# Patient Record
Sex: Male | Born: 1945
Health system: Southern US, Community
[De-identification: ages and names within clinical notes are randomized; demographics above are authoritative.]

## PROBLEM LIST (undated history)

## (undated) DIAGNOSIS — G4730 Sleep apnea, unspecified: Secondary | ICD-10-CM

## (undated) DIAGNOSIS — M5416 Radiculopathy, lumbar region: Secondary | ICD-10-CM

## (undated) DIAGNOSIS — H269 Unspecified cataract: Secondary | ICD-10-CM

## (undated) DIAGNOSIS — M79606 Pain in leg, unspecified: Secondary | ICD-10-CM

## (undated) DIAGNOSIS — M1990 Unspecified osteoarthritis, unspecified site: Secondary | ICD-10-CM

## (undated) DIAGNOSIS — M4120 Other idiopathic scoliosis, site unspecified: Secondary | ICD-10-CM

## (undated) DIAGNOSIS — N400 Enlarged prostate without lower urinary tract symptoms: Secondary | ICD-10-CM

## (undated) DIAGNOSIS — F329 Major depressive disorder, single episode, unspecified: Secondary | ICD-10-CM

## (undated) DIAGNOSIS — N189 Chronic kidney disease, unspecified: Secondary | ICD-10-CM

## (undated) DIAGNOSIS — M545 Low back pain, unspecified: Secondary | ICD-10-CM

## (undated) DIAGNOSIS — R05 Cough: Secondary | ICD-10-CM

## (undated) DIAGNOSIS — K429 Umbilical hernia without obstruction or gangrene: Secondary | ICD-10-CM

## (undated) DIAGNOSIS — R053 Chronic cough: Secondary | ICD-10-CM

## (undated) DIAGNOSIS — H3320 Serous retinal detachment, unspecified eye: Secondary | ICD-10-CM

## (undated) DIAGNOSIS — F32A Depression, unspecified: Secondary | ICD-10-CM

## (undated) DIAGNOSIS — I10 Essential (primary) hypertension: Secondary | ICD-10-CM

## (undated) DIAGNOSIS — M48061 Spinal stenosis, lumbar region without neurogenic claudication: Secondary | ICD-10-CM

## (undated) DIAGNOSIS — Z5189 Encounter for other specified aftercare: Secondary | ICD-10-CM

## (undated) DIAGNOSIS — K635 Polyp of colon: Secondary | ICD-10-CM

## (undated) DIAGNOSIS — C9000 Multiple myeloma not having achieved remission: Secondary | ICD-10-CM

## (undated) HISTORY — PX: COLONOSCOPY: SHX174

## (undated) HISTORY — DX: Unspecified osteoarthritis, unspecified site: M19.90

## (undated) HISTORY — DX: Sleep apnea, unspecified: G47.30

## (undated) HISTORY — DX: Benign prostatic hyperplasia without lower urinary tract symptoms: N40.0

## (undated) HISTORY — DX: Encounter for other specified aftercare: Z51.89

## (undated) HISTORY — PX: JOINT REPLACEMENT: SHX530

## (undated) HISTORY — DX: Chronic cough: R05.3

## (undated) HISTORY — DX: Pain in leg, unspecified: M79.606

## (undated) HISTORY — DX: Low back pain, unspecified: M54.50

## (undated) HISTORY — DX: Polyp of colon: K63.5

## (undated) HISTORY — DX: Other idiopathic scoliosis, site unspecified: M41.20

## (undated) HISTORY — PX: UMBILICAL HERNIA REPAIR: SHX196

## (undated) HISTORY — DX: Radiculopathy, lumbar region: M54.16

## (undated) HISTORY — PX: CATARACT EXTRACTION: SUR2

## (undated) HISTORY — DX: Unspecified cataract: H26.9

## (undated) HISTORY — DX: Essential (primary) hypertension: I10

## (undated) HISTORY — DX: Multiple myeloma not having achieved remission: C90.00

## (undated) HISTORY — PX: OTHER SURGICAL HISTORY: SHX169

## (undated) HISTORY — DX: Spinal stenosis, lumbar region without neurogenic claudication: M48.061

## (undated) HISTORY — DX: Umbilical hernia without obstruction or gangrene: K42.9

## (undated) MED FILL — Dexamethasone Sodium Phosphate Inj 100 MG/10ML: INTRAMUSCULAR | Qty: 1 | Status: AC

---
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
## 1967-07-23 DIAGNOSIS — Z5189 Encounter for other specified aftercare: Secondary | ICD-10-CM

## 1967-07-23 HISTORY — DX: Encounter for other specified aftercare: Z51.89

## 2003-05-06 ENCOUNTER — Encounter: Payer: Self-pay | Admitting: Internal Medicine

## 2003-05-06 ENCOUNTER — Encounter: Admission: RE | Admit: 2003-05-06 | Discharge: 2003-05-06 | Payer: Self-pay | Admitting: Internal Medicine

## 2003-07-23 DIAGNOSIS — K429 Umbilical hernia without obstruction or gangrene: Secondary | ICD-10-CM

## 2003-07-23 HISTORY — DX: Umbilical hernia without obstruction or gangrene: K42.9

## 2004-05-22 ENCOUNTER — Encounter: Admission: RE | Admit: 2004-05-22 | Discharge: 2004-05-22 | Payer: Self-pay | Admitting: General Surgery

## 2004-05-24 ENCOUNTER — Ambulatory Visit (HOSPITAL_COMMUNITY): Admission: RE | Admit: 2004-05-24 | Discharge: 2004-05-24 | Payer: Self-pay | Admitting: General Surgery

## 2004-05-24 ENCOUNTER — Ambulatory Visit (HOSPITAL_BASED_OUTPATIENT_CLINIC_OR_DEPARTMENT_OTHER): Admission: RE | Admit: 2004-05-24 | Discharge: 2004-05-24 | Payer: Self-pay | Admitting: General Surgery

## 2005-10-21 ENCOUNTER — Encounter: Admission: RE | Admit: 2005-10-21 | Discharge: 2005-10-21 | Payer: Self-pay | Admitting: Internal Medicine

## 2005-10-21 ENCOUNTER — Inpatient Hospital Stay (HOSPITAL_COMMUNITY): Admission: RE | Admit: 2005-10-21 | Discharge: 2005-10-23 | Payer: Self-pay | Admitting: Internal Medicine

## 2005-11-01 ENCOUNTER — Ambulatory Visit: Payer: Self-pay | Admitting: Internal Medicine

## 2005-11-12 ENCOUNTER — Ambulatory Visit: Payer: Self-pay | Admitting: Internal Medicine

## 2005-12-17 ENCOUNTER — Ambulatory Visit: Payer: Self-pay | Admitting: Internal Medicine

## 2006-01-03 ENCOUNTER — Ambulatory Visit: Payer: Self-pay | Admitting: Internal Medicine

## 2006-01-07 ENCOUNTER — Ambulatory Visit: Payer: Self-pay | Admitting: Cardiology

## 2008-06-10 ENCOUNTER — Ambulatory Visit: Payer: Self-pay | Admitting: Internal Medicine

## 2008-10-27 ENCOUNTER — Ambulatory Visit: Payer: Self-pay | Admitting: Internal Medicine

## 2009-05-08 ENCOUNTER — Ambulatory Visit: Payer: Self-pay | Admitting: Internal Medicine

## 2009-05-15 ENCOUNTER — Ambulatory Visit: Payer: Self-pay | Admitting: Internal Medicine

## 2009-05-26 ENCOUNTER — Ambulatory Visit: Payer: Self-pay | Admitting: Internal Medicine

## 2009-12-29 ENCOUNTER — Ambulatory Visit: Payer: Self-pay | Admitting: Internal Medicine

## 2010-01-15 ENCOUNTER — Ambulatory Visit: Payer: Self-pay | Admitting: Internal Medicine

## 2010-01-15 ENCOUNTER — Encounter: Admission: RE | Admit: 2010-01-15 | Discharge: 2010-01-15 | Payer: Self-pay | Admitting: Internal Medicine

## 2010-02-12 ENCOUNTER — Ambulatory Visit: Payer: Self-pay | Admitting: Internal Medicine

## 2010-05-04 ENCOUNTER — Ambulatory Visit: Payer: Self-pay | Admitting: Internal Medicine

## 2010-07-22 DIAGNOSIS — H3320 Serous retinal detachment, unspecified eye: Secondary | ICD-10-CM

## 2010-07-22 HISTORY — PX: INGUINAL HERNIA REPAIR: SUR1180

## 2010-07-22 HISTORY — DX: Serous retinal detachment, unspecified eye: H33.20

## 2010-10-01 ENCOUNTER — Ambulatory Visit (INDEPENDENT_AMBULATORY_CARE_PROVIDER_SITE_OTHER): Payer: BC Managed Care – PPO | Admitting: Internal Medicine

## 2010-10-01 DIAGNOSIS — K4041 Unilateral inguinal hernia, with gangrene, recurrent: Secondary | ICD-10-CM

## 2010-11-02 ENCOUNTER — Other Ambulatory Visit: Payer: Self-pay | Admitting: Internal Medicine

## 2010-11-02 ENCOUNTER — Ambulatory Visit (INDEPENDENT_AMBULATORY_CARE_PROVIDER_SITE_OTHER): Payer: BC Managed Care – PPO | Admitting: Internal Medicine

## 2010-11-02 ENCOUNTER — Ambulatory Visit
Admission: RE | Admit: 2010-11-02 | Discharge: 2010-11-02 | Disposition: A | Discharge status: Home / Self Care | Payer: BC Managed Care – PPO | Source: Ambulatory Visit | Attending: Internal Medicine | Admitting: Internal Medicine

## 2010-11-02 DIAGNOSIS — J9801 Acute bronchospasm: Secondary | ICD-10-CM

## 2010-11-02 DIAGNOSIS — R062 Wheezing: Secondary | ICD-10-CM

## 2010-11-02 DIAGNOSIS — J209 Acute bronchitis, unspecified: Secondary | ICD-10-CM

## 2010-11-08 ENCOUNTER — Ambulatory Visit
Admission: RE | Admit: 2010-11-08 | Discharge: 2010-11-08 | Disposition: A | Discharge status: Home / Self Care | Payer: BC Managed Care – PPO | Source: Ambulatory Visit | Attending: General Surgery | Admitting: General Surgery

## 2010-11-08 ENCOUNTER — Other Ambulatory Visit: Payer: Self-pay | Admitting: General Surgery

## 2010-11-08 DIAGNOSIS — Z01811 Encounter for preprocedural respiratory examination: Secondary | ICD-10-CM
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
## 2010-12-07 NOTE — Discharge Summary (Signed)
[name], [name]               ACCOUNT NO.:  [id number]   MEDICAL RECORD NO.:  [id number]          PATIENT TYPE:  INP   LOCATION:  1621                         FACILITY:  [facility]   PHYSICIAN:  Luanna Cole. Lenord Fellers, M.D.   DATE OF BIRTH:  1945-09-11   DATE OF ADMISSION:  10/21/2005  DATE OF DISCHARGE:  10/23/2005                                 DISCHARGE SUMMARY   FINAL DIAGNOSIS:  Acute lower abdominal pain:  Probable diverticulitis, free  fluid within the peritoneum, possibly due to perforated diverticulum versus  a sigmoid tumor perforation.   CONDITION ON DISCHARGE:  Stable and improved.   DISCHARGE MEDICATIONS:  1.  Aspirin 81 mg daily.  2.  Cipro 500 mg p.o. twice a day for 10 days.  3.  Flagyl 500 mg p.o. twice a day for 10 days.  4.  Vicodin 5/500 1 p.o. q.4-6h. p.r.n. pain.  5.  Aspirin 81 mg daily.   FOLLOW UP:  Follow up with Dr. Iva Boop November 12, 2005 at 2 p.m.   CONSULTATIONS:  1.  Dr. Lebron Conners.  2.  Dr. Iva Boop.   BRIEF HISTORY:  This pleasant 65 year old white male, who is in generally  good health, presented to the office on October 21, 2005 with history of onset  of suprapubic and left lower quadrant abdominal pain midday on October 20, 2005.  He had no vomiting and no diarrhea.  Bowel movements have been normal  over the past few days.  No nausea.  He was very uncomfortable with movement  but it was clear he had pain with movement.  He had a normal KUB, flat and  upright abdominal film, white blood cell count was 8000.  Urinalysis was  normal.  He was noted to be tender in the suprapubic and left lower quadrant  area.  He had no fever.  His prostate was boggy.  No shaking chills, no  melena, no bright red blood per rectum.  He had an outpatient walk-in CT of  the abdomen and pelvis.  Dr. Jean Rosenthal called to say there was free fluid in  his pelvis within the peritoneum and some inflammation about his sigmoid  colon.  The patient was thought to  possibly have acute diverticulitis or  perhaps a perforation with diverticulitis or even a sigmoid tumor with  perforation.  After discussing the case with me, we decided the patient  should be admitted.  He was admitted to [facility] and placed on  IV fluids consisting of d5-1/2 normal saline with 10 mEq of potassium  chloride per liter at 100 mL per hour.  Vital signs were monitored q.4h.  Strict intake and output was recorded.  He was placed on IV Cipro 400 mg  q.12h. and IV Flagyl 500 mg q.8h.  He was made n.p.o.  He was given morphine  IV 2 to 4 mg q.4h. as needed for pain.   The following day, the patient had not improved very much.  He was seen  initially upon admission by Dr. Lebron Conners, general surgeon.  It was  Dr.  Cammie Sickle opinion that the patient's presentation, if one of diverticulitis  was a bit unusual and warranted careful observation.  He agreed with the  above treatment.  He also suggested GI consultation.  The patient did have a  previous colonoscopy with Dr. Lina Sar.  [facility] was consulted and Dr.  Stan Head saw the patient.  Dr. Leone Payor felt that the patient should not  undergo immediate colonoscopy or sigmoidoscopy due to the possible risk of  perforation.  It was decided that the patient would be maintained on  antibiotics and allowed to be treated medically with plans to scope the  patient in a couple of weeks.  The patient's white count remained normal.  He remained afebrile throughout his entire hospital course.   By October 23, 2005, the patient had improved and passed quite a bit of gas.  He was started on clear liquids and by lunch time he was advanced to a low  residue diet which he tolerated well.  He had no pain medication at all on  October 23, 2005.  By late evening, he was eager to go home and seemed to be  doing well.  He was discharged home on the above medications with plans to  follow up with Dr. Leone Payor on November 12, 2005 at 2 p.m.   The patient was  advised to call if he developed recurrent symptoms of pain or new symptoms  of fever, shaking chills, nausea, or vomiting.           ______________________________  Luanna Cole. Lenord Fellers, M.D.     MJB/MEDQ  D:  10/23/2005  T:  10/24/2005  Job:  [id number]   cc:   Lebron Conners, M.D.  [address]  [place]  [place] [address]   Iva Boop, M.D. [facility]  [address] [place], [place] [address]

## 2010-12-07 NOTE — Op Note (Signed)
NAMEEULON, ALLNUTT               ACCOUNT NO.:  [id number]   MEDICAL RECORD NO.:  [id number]          PATIENT TYPE:  AMB   LOCATION:  DSC                          FACILITY:  [facility]   PHYSICIAN:  Adolph Pollack, M.D.DATE OF BIRTH:  [birth year]   DATE OF PROCEDURE:  05/24/2004  DATE OF DISCHARGE:                                 OPERATIVE REPORT   PREOPERATIVE DIAGNOSIS:  Chronically incarcerated umbilical hernia.   POSTOPERATIVE DIAGNOSIS:  Chronically incarcerated umbilical hernia.   OPERATION PERFORMED:  Umbilical hernia repair with mesh.   SURGEON:  Adolph Pollack, M.D.   ANESTHESIA:  General.   INDICATIONS FOR PROCEDURE:  The patient is a 65 year old male who has  noticed an increasing periumbilical bulge a little bit uncomfortable at  times.  He has a chronically incarcerated umbilical hernia and presents for  repair.   DESCRIPTION OF PROCEDURE:  He was seen in the holding area and placed supine  on the operating table and given a general anesthetic.  The periumbilical  hair was clipped and the area was prepped and draped.  I partially was able  to reduce the hernia.  Dilute Marcaine solution was infiltrated  superficially and deep in the periumbilical area.  A curvilinear  subumbilical incision was made through the skin and subcutaneous tissue down  to the level of the fascia.  I was able to dissect around the umbilicus and  isolate it.  I incised the sac and noted incarcerated omentum which I was  able reduce.  I then incised the sac more and released the umbilicus.  This  exposed the hernia defect.  I dissected the subcutaneous tissue away from  the fascia in circumferential fashion allowing for a 3 to 4 cm overlap.  I  then primarily closed the umbilical hernia defect with interrupted 0  Surgilon sutures.  A piece of polypropylene mesh was brought into the field.  The suture strings were threaded through the midportion of the mesh and this  was tightened down  anchoring the mesh directly over the primary repair.  The  periphery of the mesh was then anchored to the fascia with a running 0  Prolene suture.  This provided for more than adequate coverage of the  primary defect with overlap.  I injected the local anesthetic into the  fascia next.  I then reimplanted the umbilicus to the fascia with 3-0 Vicryl  suture.  The subcutaneous tissue was then closed over the fascia with  running 3-0 Vicryl suture and skin  closed with 4-0 Monocryl subcuticular stitch.  Steri-Strips and sterile  dressing were applied.  The patient tolerated the procedure without any  apparent complications.  Sponge, needle and instrument counts were correct  before closure.  He subsequently was taken to the recovery room in  satisfactory condition.       TJR/MEDQ  D:  05/24/2004  T:  05/24/2004  Job:  [id number]   cc:   Luanna Cole. Lenord Fellers, M.D.  [address], [place]  [place]  [place] [address]  Fax: [phone]
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
## 2010-12-18 ENCOUNTER — Encounter: Payer: Self-pay | Admitting: Internal Medicine

## 2011-03-28 ENCOUNTER — Encounter: Payer: Self-pay | Admitting: Internal Medicine

## 2011-03-29 ENCOUNTER — Other Ambulatory Visit: Payer: Self-pay | Admitting: Internal Medicine

## 2011-03-29 ENCOUNTER — Other Ambulatory Visit: Payer: BC Managed Care – PPO | Admitting: Internal Medicine

## 2011-03-29 DIAGNOSIS — Z0000 Encounter for general adult medical examination without abnormal findings: Secondary | ICD-10-CM

## 2011-03-29 LAB — COMPREHENSIVE METABOLIC PANEL
ALT: 16 U/L (ref 0–53)
AST: 28 U/L (ref 0–37)
Albumin: 4.1 g/dL (ref 3.5–5.2)
Alkaline Phosphatase: 68 U/L (ref 39–117)
BUN: 15 mg/dL (ref 6–23)
CO2: 24 mEq/L (ref 19–32)
Calcium: 9.1 mg/dL (ref 8.4–10.5)
Chloride: 105 mEq/L (ref 96–112)
Creat: 1.33 mg/dL (ref 0.50–1.35)
Glucose, Bld: 95 mg/dL (ref 70–99)
Potassium: 4.7 mEq/L (ref 3.5–5.3)
Sodium: 138 mEq/L (ref 135–145)
Total Bilirubin: 0.5 mg/dL (ref 0.3–1.2)
Total Protein: 7.1 g/dL (ref 6.0–8.3)

## 2011-03-29 LAB — LIPID PANEL
Cholesterol: 199 mg/dL (ref 0–200)
HDL: 41 mg/dL (ref >39)
LDL Cholesterol: 132 mg/dL — ABNORMAL HIGH (ref 0–99)
Total CHOL/HDL Ratio: 4.9 Ratio
Triglycerides: 131 mg/dL (ref <150)
VLDL: 26 mg/dL (ref 0–40)

## 2011-03-29 LAB — CBC
HCT: 41.3 % (ref 39.0–52.0)
Hemoglobin: 14 g/dL (ref 13.0–17.0)
MCH: 32.7 pg (ref 26.0–34.0)
MCHC: 33.9 g/dL (ref 30.0–36.0)
MCV: 96.5 fL (ref 78.0–100.0)
Platelets: 221 10*3/uL (ref 150–400)
RBC: 4.28 MIL/uL (ref 4.22–5.81)
RDW: 13.5 % (ref 11.5–15.5)
WBC: 6.4 10*3/uL (ref 4.0–10.5)

## 2011-03-29 LAB — PSA: PSA: 0.8 ng/mL (ref <=4.00)

## 2011-04-01 ENCOUNTER — Encounter: Payer: Self-pay | Admitting: Internal Medicine

## 2011-04-01 ENCOUNTER — Ambulatory Visit (INDEPENDENT_AMBULATORY_CARE_PROVIDER_SITE_OTHER): Payer: BC Managed Care – PPO | Admitting: Internal Medicine

## 2011-04-01 VITALS — BP 116/78 | HR 76 | Temp 98.6°F | Ht 70.0 in | Wt 230.0 lb

## 2011-04-01 DIAGNOSIS — F329 Major depressive disorder, single episode, unspecified: Secondary | ICD-10-CM

## 2011-04-01 DIAGNOSIS — K4090 Unilateral inguinal hernia, without obstruction or gangrene, not specified as recurrent: Secondary | ICD-10-CM

## 2011-04-01 DIAGNOSIS — M109 Gout, unspecified: Secondary | ICD-10-CM

## 2011-04-01 DIAGNOSIS — D126 Benign neoplasm of colon, unspecified: Secondary | ICD-10-CM

## 2011-04-01 DIAGNOSIS — Z8719 Personal history of other diseases of the digestive system: Secondary | ICD-10-CM

## 2011-04-01 DIAGNOSIS — F32A Depression, unspecified: Secondary | ICD-10-CM

## 2011-04-01 DIAGNOSIS — Z0000 Encounter for general adult medical examination without abnormal findings: Secondary | ICD-10-CM

## 2011-04-01 DIAGNOSIS — F3289 Other specified depressive episodes: Secondary | ICD-10-CM

## 2011-04-01 LAB — POCT URINALYSIS DIPSTICK
Bilirubin, UA: NEGATIVE
Blood, UA: NEGATIVE
Glucose, UA: NEGATIVE
Ketones, UA: NEGATIVE
Leukocytes, UA: NEGATIVE
Nitrite, UA: NEGATIVE
Protein, UA: NEGATIVE
Spec Grav, UA: 1.01
Urobilinogen, UA: NEGATIVE
pH, UA: 5

## 2011-04-01 LAB — HEMOGLOBIN A1C
Hgb A1c MFr Bld: 6 % — ABNORMAL HIGH (ref <5.7)
Mean Plasma Glucose: 126 mg/dL — ABNORMAL HIGH (ref <117)

## 2011-04-02 ENCOUNTER — Encounter: Payer: Self-pay | Admitting: Internal Medicine

## 2011-04-18 DIAGNOSIS — Z8719 Personal history of other diseases of the digestive system: Secondary | ICD-10-CM | POA: Insufficient documentation

## 2011-04-18 DIAGNOSIS — M109 Gout, unspecified: Secondary | ICD-10-CM | POA: Insufficient documentation

## 2011-04-18 DIAGNOSIS — Z860100 Personal history of colon polyps, unspecified: Secondary | ICD-10-CM | POA: Insufficient documentation

## 2011-04-18 DIAGNOSIS — F329 Major depressive disorder, single episode, unspecified: Secondary | ICD-10-CM | POA: Insufficient documentation

## 2011-04-18 DIAGNOSIS — K4090 Unilateral inguinal hernia, without obstruction or gangrene, not specified as recurrent: Secondary | ICD-10-CM | POA: Insufficient documentation

## 2011-04-18 DIAGNOSIS — F32A Depression, unspecified: Secondary | ICD-10-CM | POA: Insufficient documentation
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
## 2011-06-06 ENCOUNTER — Ambulatory Visit (INDEPENDENT_AMBULATORY_CARE_PROVIDER_SITE_OTHER): Payer: BC Managed Care – PPO | Admitting: General Surgery

## 2011-06-06 ENCOUNTER — Encounter (INDEPENDENT_AMBULATORY_CARE_PROVIDER_SITE_OTHER): Payer: Self-pay | Admitting: General Surgery

## 2011-06-06 VITALS — BP 120/80 | HR 45 | Temp 98.4°F | Resp 14

## 2011-06-06 DIAGNOSIS — K4090 Unilateral inguinal hernia, without obstruction or gangrene, not specified as recurrent: Secondary | ICD-10-CM

## 2011-06-06 NOTE — Patient Instructions (Signed)
Please stop your aspirin 5 days before surgery.

## 2011-06-06 NOTE — Progress Notes (Signed)
Patient ID: [name], male   DOB: 03/24/1946, 65 y.o.   MRN: [id number]  Chief Complaint  Patient presents with  . Other    Left inguinal hernia    HPI [name] is a 65 y.o. male.   HPI   He has a known small left ingunal hernia that had been asx until recently when it has started causing him some discomfort.  No obstructive sxs.  He does not strain to urinate or have constipation.  He is interested in hernia repair.  Past Medical History  Diagnosis Date  . Gout   . Umbilical hernia 2005    hernia repair    Past Surgical History  Procedure Date  . Umbilical hernia repair   . Inguinal hernia repair 2012    right  . Joint replacement     fused finger joint right ring finger  . Tonsilectomy, adenoidectomy, bilateral myringotomy and tubes 1953    Family History  Problem Relation Age of Onset  . Cancer Mother     lung   . Cancer Father     cancer    Social History History  Substance Use Topics  . Smoking status: Never Smoker   . Smokeless tobacco: Not on file  . Alcohol Use: Yes    No Known Allergies  Current Outpatient Prescriptions  Medication Sig Dispense Refill  . aspirin 81 MG tablet Take 81 mg by mouth daily.        . Multiple Vitamins-Minerals (MULTIVITAMIN,TX-MINERALS) tablet Take 1 tablet by mouth daily.          Review of Systems Review of Systems  Constitutional: Negative.   Respiratory: Positive for cough.   Cardiovascular: Negative.   Gastrointestinal: Negative.   Genitourinary: Negative for difficulty urinating.  Hematological: Bruises/bleeds easily (on aspirin).    Blood pressure 120/80, pulse 45, temperature 98.4 F (36.9 C), temperature source Temporal, resp. rate 14.  Physical Exam Physical Exam  Constitutional: No distress.       Overweight.  Cardiovascular: Normal rate and regular rhythm.   No murmur heard. Pulmonary/Chest: Effort normal and breath sounds normal.  Abdominal: Soft. He exhibits no mass.   Subumbilical scar.  Genitourinary:       Right inguinal scar with solid floor.  Moderate size left inguinal bulge that is reducible in the supine position  Musculoskeletal:       Varicosities in LEs.    Data Reviewed Previous op note  Assessment    Enlarging and now symptomatic left inguinal hernia.    Plan    Open left inguinal hernia repair with mesh.  I have explained the procedure, risks, and aftercare of inguinal hernia repair.  Risks include but are not limited to bleeding, infection, wound problems, anesthesia, recurrence, bladder or intestine injury, urinary retention, testicular dysfunction, chronic pain, mesh problems.  He seems to understand and agrees to proceed.       Frayda Egley J 06/06/2011, 4:43 PM

## 2011-07-05 DIAGNOSIS — K4090 Unilateral inguinal hernia, without obstruction or gangrene, not specified as recurrent: Secondary | ICD-10-CM

## 2011-07-08 ENCOUNTER — Other Ambulatory Visit (INDEPENDENT_AMBULATORY_CARE_PROVIDER_SITE_OTHER): Payer: Self-pay

## 2011-07-08 DIAGNOSIS — G8918 Other acute postprocedural pain: Secondary | ICD-10-CM

## 2011-07-08 MED ORDER — HYDROCODONE-ACETAMINOPHEN 5-325 MG PO TABS: 1.0000 | ORAL_TABLET | Freq: Four times a day (QID) | ORAL | Status: AC | PRN

## 2011-07-08 NOTE — Telephone Encounter (Signed)
Patient called and said he needed refill of pain medicine. He was taking Oxycodone. I told him I could call in a protocol of hydrocodone into his pharmacy. He said that was ok. Calling into [facility].

## 2011-08-05 ENCOUNTER — Telehealth (INDEPENDENT_AMBULATORY_CARE_PROVIDER_SITE_OTHER): Payer: Self-pay | Admitting: General Surgery

## 2011-08-05 NOTE — Telephone Encounter (Signed)
Pt states he is already 4wks out of post op, is it ok if he comes in on 08/23/11, this is the soonest available for Dr. Armanda Heritage, if not please call.

## 2011-08-05 NOTE — Telephone Encounter (Signed)
Spoke with [name] to tell him it was fine for him to wait until 08/23/11 for his post op appt.  I told him to call our office if he had any questions or concerns.

## 2011-08-23 ENCOUNTER — Ambulatory Visit (INDEPENDENT_AMBULATORY_CARE_PROVIDER_SITE_OTHER): Payer: BC Managed Care – PPO | Admitting: General Surgery

## 2011-08-23 ENCOUNTER — Encounter (INDEPENDENT_AMBULATORY_CARE_PROVIDER_SITE_OTHER): Payer: Self-pay | Admitting: General Surgery

## 2011-08-23 VITALS — BP 120/70 | HR 68 | Temp 98.1°F | Resp 16 | Ht 71.5 in | Wt 237.0 lb

## 2011-08-23 DIAGNOSIS — Z9889 Other specified postprocedural states: Secondary | ICD-10-CM

## 2011-08-23 NOTE — Progress Notes (Signed)
He presents for postop followup after open left inguinal hernia repair with mesh about 6 weeks ago.  Post op pain is improving.  No difficulty voiding or having BMs.  Swelling is decreasing.  P.E.  GU:  Left groin incision clean/dry/intact, swelling is minimal, repair is solid.  Assessment:  Doing well post hernia repair.  Plan:  Activities as tolerated.  Avoid activities that cause significant discomfort for the long term.  Return visit as needed.

## 2011-08-23 NOTE — Patient Instructions (Signed)
Activities as tolerated. 

## 2011-09-20 ENCOUNTER — Encounter (INDEPENDENT_AMBULATORY_CARE_PROVIDER_SITE_OTHER): Payer: Self-pay | Admitting: General Surgery

## 2012-04-13 ENCOUNTER — Encounter: Payer: Self-pay | Admitting: Internal Medicine

## 2012-05-26 ENCOUNTER — Encounter: Payer: Self-pay | Admitting: Internal Medicine

## 2012-06-26 ENCOUNTER — Ambulatory Visit (AMBULATORY_SURGERY_CENTER): Payer: BC Managed Care – PPO | Admitting: *Deleted

## 2012-06-26 VITALS — Ht 71.5 in | Wt 242.0 lb

## 2012-06-26 DIAGNOSIS — Z1211 Encounter for screening for malignant neoplasm of colon: Secondary | ICD-10-CM

## 2012-06-26 MED ORDER — SUPREP BOWEL PREP KIT 17.5-3.13-1.6 GM/177ML PO SOLN: ORAL | Status: DC

## 2012-07-20 ENCOUNTER — Ambulatory Visit (AMBULATORY_SURGERY_CENTER): Payer: BC Managed Care – PPO | Admitting: Internal Medicine

## 2012-07-20 ENCOUNTER — Encounter: Payer: Self-pay | Admitting: Internal Medicine

## 2012-07-20 VITALS — BP 103/69 | HR 56 | Temp 97.6°F | Resp 17 | Ht 71.0 in | Wt 242.0 lb

## 2012-07-20 DIAGNOSIS — Z86010 Personal history of colonic polyps: Secondary | ICD-10-CM

## 2012-07-20 DIAGNOSIS — K5730 Diverticulosis of large intestine without perforation or abscess without bleeding: Secondary | ICD-10-CM

## 2012-07-20 DIAGNOSIS — Z1211 Encounter for screening for malignant neoplasm of colon: Secondary | ICD-10-CM

## 2012-07-20 DIAGNOSIS — K648 Other hemorrhoids: Secondary | ICD-10-CM

## 2012-07-20 MED ORDER — SODIUM CHLORIDE 0.9 % IV SOLN: 500.0000 mL | INTRAVENOUS | Status: DC

## 2012-07-20 NOTE — Op Note (Signed)
[facility] [address] [place], [address]   COLONOSCOPY PROCEDURE REPORT  PATIENT: Wesley, [name]  MR#: [id number] BIRTHDATE: 12-Mar-1946 , 66  yrs. old GENDER: Male ENDOSCOPIST: Iva Boop, MD, [facility] PROCEDURE DATE:  07/20/2012 PROCEDURE:   Colonoscopy, diagnostic ASA CLASS:   Class II INDICATIONS:Screening and surveillance,personal history of colonic polyps. MEDICATIONS: propofol (Diprivan) 200mg  IV, MAC sedation, administered by CRNA, and These medications were titrated to patient response per physician's verbal order  DESCRIPTION OF PROCEDURE:   After the risks benefits and alternatives of the procedure were thoroughly explained, informed consent was obtained.  A digital rectal exam revealed no abnormalities of the rectum and A digital rectal exam revealed the prostate was not enlarged.   The Fuse-Demo-Scope  endoscope was introduced through the anus and advanced to the cecum, which was identified by both the appendix and ileocecal valve. No adverse events experienced.   The quality of the prep was Suprep excellent The instrument was then slowly withdrawn as the colon was fully examined.Photographs were taken and printed and will be uploaded to EMR.      COLON FINDINGS: Moderate diverticulosis was noted in the sigmoid colon.   Small internal hemorrhoids were found.   The colon mucosa was otherwise normal.   A right colon retroflexion was performed. Retroflexed views revealed internal hemorrhoids. The time to cecum=2 minutes 38 seconds.  Withdrawal time=9 minutes 49 seconds. The scope was withdrawn and the procedure completed. COMPLICATIONS: There were no complications.  ENDOSCOPIC IMPRESSION: 1.   Moderate diverticulosis was noted in the sigmoid colon 2.   Small internal hemorrhoids 3.   The colon mucosa was otherwise normal - excellent prep  RECOMMENDATIONS: Repeat Colonscopy in 10 years since no polyps today and in 2007 and only had 4  and 7 mm adenomas (2 total0 in 2003   eSigned:  Iva Boop, MD, [facility] 07/20/2012 10:38 AM   cc: Sharlet Salina, MD and The Patient

## 2012-07-20 NOTE — Patient Instructions (Addendum)
No polyps today! You do have diverticulosis and internal hemorrhoids. Please read the handouts provided. Next routine colonoscopy in 10 years 2023.  Thank you for choosing me and [facility].  Iva Boop, MD, FACG  YOU HAD AN ENDOSCOPIC PROCEDURE TODAY AT THE  ENDOSCOPY CENTER: Refer to the procedure report that was given to you for any specific questions about what was found during the examination.  If the procedure report does not answer your questions, please call your gastroenterologist to clarify.  If you requested that your care partner not be given the details of your procedure findings, then the procedure report has been included in a sealed envelope for you to review at your convenience later.  YOU SHOULD EXPECT: Some feelings of bloating in the abdomen. Passage of more gas than usual.  Walking can help get rid of the air that was put into your GI tract during the procedure and reduce the bloating. If you had a lower endoscopy (such as a colonoscopy or flexible sigmoidoscopy) you may notice spotting of blood in your stool or on the toilet paper. If you underwent a bowel prep for your procedure, then you may not have a normal bowel movement for a few days.  DIET: Your first meal following the procedure should be a light meal and then it is ok to progress to your normal diet.  A half-sandwich or bowl of soup is an example of a good first meal.  Heavy or fried foods are harder to digest and may make you feel nauseous or bloated.  Likewise meals heavy in dairy and vegetables can cause extra gas to form and this can also increase the bloating.  Drink plenty of fluids but you should avoid alcoholic beverages for 24 hours.  ACTIVITY: Your care partner should take you home directly after the procedure.  You should plan to take it easy, moving slowly for the rest of the day.  You can resume normal activity the day after the procedure however you should NOT DRIVE or use heavy  machinery for 24 hours (because of the sedation medicines used during the test).    SYMPTOMS TO REPORT IMMEDIATELY: A gastroenterologist can be reached at any hour.  During normal business hours, 8:30 AM to 5:00 PM Monday through Friday, call [phone].  After hours and on weekends, please call the GI answering service at [phone] who will take a message and have the physician on call contact you.   Following lower endoscopy (colonoscopy or flexible sigmoidoscopy):  Excessive amounts of blood in the stool  Significant tenderness or worsening of abdominal pains  Swelling of the abdomen that is new, acute  Fever of 100F or higher  FOLLOW UP: If any biopsies were taken you will be contacted by phone or by letter within the next 1-3 weeks.  Call your gastroenterologist if you have not heard about the biopsies in 3 weeks.  Our staff will call the home number listed on your records the next business day following your procedure to check on you and address any questions or concerns that you may have at that time regarding the information given to you following your procedure. This is a courtesy call and so if there is no answer at the home number and we have not heard from you through the emergency physician on call, we will assume that you have returned to your regular daily activities without incident.  SIGNATURES/CONFIDENTIALITY: You and/or your care partner have signed paperwork which will  be entered into your electronic medical record.  These signatures attest to the fact that that the information above on your After Visit Summary has been reviewed and is understood.  Full responsibility of the confidentiality of this discharge information lies with you and/or your care-partner.

## 2012-07-20 NOTE — Progress Notes (Signed)
Patient did not experience any of the following events: a burn prior to discharge; a fall within the facility; wrong site/side/patient/procedure/implant event; or a hospital transfer or hospital admission upon discharge from the facility. (G8907) Patient did not have preoperative order for IV antibiotic SSI prophylaxis. (G8918)  

## 2012-07-21 ENCOUNTER — Encounter: Payer: Self-pay | Admitting: Internal Medicine

## 2012-07-21 ENCOUNTER — Telehealth: Payer: Self-pay | Admitting: *Deleted

## 2012-07-21 NOTE — Telephone Encounter (Signed)
No answer, message left for the patient. 

## 2012-07-23 ENCOUNTER — Telehealth: Payer: Self-pay | Admitting: Internal Medicine

## 2012-07-23 NOTE — Telephone Encounter (Signed)
Patient had colonoscopy by Dr. Leone Payor showing diverticulosis and internal hemorrhoids on 07/20/2012. His wife called today and said he's been having daily chest pain for a number of days. We contacted the patient he admitted he been having some chest tightness. He is to come in tomorrow. He has an appointment for physical exam later this month. If symptoms acutely worsen, he go to the emergency department

## 2012-07-24 ENCOUNTER — Ambulatory Visit
Admission: RE | Admit: 2012-07-24 | Discharge: 2012-07-24 | Disposition: A | Discharge status: Home / Self Care | Payer: BC Managed Care – PPO | Source: Ambulatory Visit | Attending: Internal Medicine | Admitting: Internal Medicine

## 2012-07-24 ENCOUNTER — Ambulatory Visit (INDEPENDENT_AMBULATORY_CARE_PROVIDER_SITE_OTHER): Payer: BC Managed Care – PPO | Admitting: Internal Medicine

## 2012-07-24 ENCOUNTER — Encounter: Payer: Self-pay | Admitting: Internal Medicine

## 2012-07-24 VITALS — BP 120/86 | HR 60 | Temp 98.4°F | Wt 238.0 lb

## 2012-07-24 DIAGNOSIS — R0789 Other chest pain: Secondary | ICD-10-CM

## 2012-07-24 DIAGNOSIS — Z8659 Personal history of other mental and behavioral disorders: Secondary | ICD-10-CM

## 2012-07-24 DIAGNOSIS — R7302 Impaired glucose tolerance (oral): Secondary | ICD-10-CM

## 2012-07-24 DIAGNOSIS — I10 Essential (primary) hypertension: Secondary | ICD-10-CM

## 2012-07-24 DIAGNOSIS — R7309 Other abnormal glucose: Secondary | ICD-10-CM

## 2012-07-24 NOTE — Patient Instructions (Addendum)
Take nitroglycerin sublingually for recurrent chest pain. Let me know if this works. Start Lopressor 25 mg daily for hypertension. We will make appointment for you to see cardiology regarding chest tightness. Keep appointment for physical exam later this month.

## 2012-07-24 NOTE — Progress Notes (Addendum)
Subjective:    Patient ID: [name], male    DOB: 05-07-1946, 67 y.o.   MRN: [id number]  HPI 67 year old white male with history of depression and adenomatous colon polyps with recent colonoscopy by Dr. Leone Payor which was normal.  Wife called yesterday and said he been having some chest tightness for some time. We advised patient be seen today. Patient says for several months he's experienced chest 2 to 3 times a week. It would occur at rest when he was getting ready to read before bed. He took some TUMS for relief. Would feel better with TUMS. The past week or so he's had daily chest tightness. Some slight shortness of breath. No diaphoresis, nausea, or vomiting. No radiation of pain to neck or down left arm. No pain with exercise. He is basically sedentary. He commutes to [place] 5 days a week. He has to change that to 4 days a week in the near future. Denies being under stress. Likes his job but doesn't like the commute very much & says he is" tired of being tired."  Family history: Father died of lung cancer at age 78 but had an MI at age 32 and had history of smoking. Paternal grandmother with history of MI and diabetes. Mother died of lung cancer at age 58.  Social history: He is married. Wife has multiple sclerosis but works as a Designer, jewellery. He is a nonsmoker. Social alcohol consumption. This is his second marriage. First marriage ended in divorce. He has an adult daughter. 2 sisters- one of which has Crohn's disease and COPD. 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  Past medical history: Tonsillectomy 1953. Pneumonia 1969. Dementia went 1969. Torn tendon in finger 1982. Had fracture 1999.  No known drug allergies takes baby aspirin daily.  Patient had Cardiolite study by Dr. Myrtis Ser 06/20/2003. Wall motion was good. No evidence of ischemia. Ejection fraction was 54%. Patient walked on treadmill for 7 minutes with a peak heart rate of 157. Patient had cardiac  catheterization in 2001 with report of normal coronary arteries. He also had Cardiolite study done at  [facility]( Dr. Salome Spotted) 04/08/2000. He had an abnormal Cardiolite stress test at that time with possible evidence of a small inferior wall infarction. This was followed by cardiac catheterization which was reportedly was normal but I do not have a copy of that report.  Admitted to [facility] 10/21/2005 with lower abdominal pain and diagnosed with diverticulitis. This was treated with antibiotics he improved.  Seen by Dr. Vernie Ammons 2011 for decreased libido. Had normal testosterone level.  History of issues with recurrent MRSA infection 2009 and 2010 that subsequently resolved with aggressive treatment with I and Ds and antibiotics plus bathing in Hibiclens.  Note by Dr. Jens Som dated 05/25/2003 states that patient had findings on cardiac catheterization 2001 of a normal left main, normal LAD, and normal. diagonal ,circumflex and obtuse marginal and normal right coronary artery. Ejection fraction was 55-60% and there was no mitral regurgitation.   Had influenza vaccine September 2013. Needs to get one at pharmacy as we are out at this time. Zostavax vaccine given for 06/11/2007. Pneumococcal vaccine 10/31/2011.  History of diverticulosis in addition to tubular adenoma on colonoscopy 2004.  History of rosacea and seborrheic keratoses 2004.  History of right inguinal hernia repair 2012. History of small left inguinal hernia that is asymptomatic. History of umbilical hernia repair.  History of hemoglobin A1c 5.7% July 2011. History of vitamin D  deficiency.  Cholesterol in 2011 showed total cholesterol of 193, triglycerides of 168, LDL cholesterol of 115. Lipid panel Sept 2012 normal except LDL of 132.  In 2009 he wanted to donate a kidney and was evaluated at [facility]. He was found to have a creatinine of 1.30 with an estimated GFR 56 cc per minute. He had a normal EKG  04/07/2008 done there. Normal dobutamine stress echocardiogram 04/26/2008. He wanted to donate a kidney to a friend of his but subsequently I believe was turned down.      Review of Systems     Objective:   Physical Exam  Constitutional: He appears well-developed and well-nourished. No distress.  HENT:  Head: Normocephalic and atraumatic.  Right Ear: External ear normal.  Left Ear: External ear normal.  Mouth/Throat: No oropharyngeal exudate.  Eyes: Conjunctivae normal are normal. Right eye exhibits no discharge. Left eye exhibits no discharge. No scleral icterus.  Neck: No JVD present. No thyromegaly present.  Cardiovascular: Normal rate, regular rhythm and normal heart sounds.  Exam reveals no gallop.   No murmur heard. Pulmonary/Chest: Effort normal and breath sounds normal. He has no wheezes. He has no rales. He exhibits no tenderness.  Abdominal: Soft. Bowel sounds are normal. He exhibits no distension and no mass. There is no tenderness. There is no rebound and no guarding.  Musculoskeletal: He exhibits no edema.  Lymphadenopathy:    He has no cervical adenopathy.  Skin: Skin is warm and dry. He is not diaphoretic.  Psychiatric: He has a normal mood and affect. His behavior is normal. Judgment and thought content normal.          Assessment & Plan:  Chest tightness at rest- with prior history of negative Cardiac evaluations  History of depression-likely depressed now  History of gout  History of adenomatous colon polyps  Hypertension he brings in multiple blood pressure readings today which are significantly elevated however his blood pressure is normal today  History of elevated LDL of 132 in 2012  Obesity      Plan: Nitroglycerin 1/150 sublingual when necessary chest pain to see if he gets relief. Start Lopressor 25 mg XL daily for hypertension and cardiac protection. Continue aspirin 81 mg daily. Cardiology evaluation.  Keep appointment for physical  exam and fasting lab work 3 weeks from now which had been previously scheduled for some time. Fasting labs will be done then including lipid panel. If has sustained chest tightness without relief he is to go to emergency department

## 2012-07-27 NOTE — Progress Notes (Signed)
Patient informed of appointment as well with Dr. Antoine Poche on 08/28/2012 at 3:30 pm. States he has had a few more episodes of chest pain, for which he is taking Nitro SL. Advised to call their office to try to get in sooner

## 2012-07-31 ENCOUNTER — Ambulatory Visit (INDEPENDENT_AMBULATORY_CARE_PROVIDER_SITE_OTHER): Payer: BC Managed Care – PPO | Admitting: Cardiology

## 2012-07-31 ENCOUNTER — Encounter: Payer: Self-pay | Admitting: Cardiology

## 2012-07-31 VITALS — BP 118/76 | HR 71 | Ht 71.0 in | Wt 239.8 lb

## 2012-07-31 DIAGNOSIS — R072 Precordial pain: Secondary | ICD-10-CM

## 2012-07-31 DIAGNOSIS — F329 Major depressive disorder, single episode, unspecified: Secondary | ICD-10-CM

## 2012-07-31 DIAGNOSIS — F3289 Other specified depressive episodes: Secondary | ICD-10-CM

## 2012-07-31 DIAGNOSIS — F32A Depression, unspecified: Secondary | ICD-10-CM
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
## 2012-07-31 NOTE — Patient Instructions (Addendum)
Your physician has requested that you have a lexiscan myoview. For further information please visit [email or web address] Please follow instruction sheet, as given.  Your physician recommends that you return for lab work in: When you come back for your nuclear stress test (lipid-fasting)

## 2012-08-03 ENCOUNTER — Ambulatory Visit (HOSPITAL_COMMUNITY): Payer: BC Managed Care – PPO | Attending: Cardiology | Admitting: Radiology

## 2012-08-03 VITALS — BP 125/84 | Ht 71.0 in | Wt 233.0 lb

## 2012-08-03 DIAGNOSIS — R0789 Other chest pain: Secondary | ICD-10-CM | POA: Insufficient documentation

## 2012-08-03 DIAGNOSIS — R000 Tachycardia, unspecified: Secondary | ICD-10-CM | POA: Insufficient documentation

## 2012-08-03 DIAGNOSIS — R079 Chest pain, unspecified: Secondary | ICD-10-CM

## 2012-08-03 DIAGNOSIS — R072 Precordial pain: Secondary | ICD-10-CM

## 2012-08-03 DIAGNOSIS — I10 Essential (primary) hypertension: Secondary | ICD-10-CM | POA: Insufficient documentation

## 2012-08-03 MED ORDER — TECHNETIUM TC 99M SESTAMIBI GENERIC - CARDIOLITE
10.0000 | Freq: Once | INTRAVENOUS | Status: AC | PRN
  Administered 2012-08-03: 10 via INTRAVENOUS

## 2012-08-03 MED ORDER — TECHNETIUM TC 99M SESTAMIBI GENERIC - CARDIOLITE
30.0000 | Freq: Once | INTRAVENOUS | Status: AC | PRN
  Administered 2012-08-03: 30 via INTRAVENOUS

## 2012-08-03 NOTE — Progress Notes (Signed)
  [facility] SITE 3 NUCLEAR MED [address] [place], [place] [address] [phone]    Cardiology Nuclear Med Study  [name] is a 67 y.o. male     MRN : [id number]     DOB: 1945-12-09  Procedure Date: 08/03/2012  Nuclear Med Background Indication for Stress Test:  Evaluation for Ischemia History:  01' Heart Catheterization;04-05 Myocardial Perfusion Study [place] cardiology nml per pt  Cardiac Risk Factors: Family History - CAD, Hypertension and Lipids  Symptoms:  Chest Pain, Chest Pressure.  (last date of chest discomfort 08/03/11 relieved with x1 NTG) and Rapid HR   Nuclear Pre-Procedure Caffeine/Decaff Intake:  7:00pm NPO After: 6:30am   Lungs:  clear O2 Sat: 97% on room air. IV 0.9% NS with Angio Cath:  20g  IV Site: R Hand  IV Started by:  Cathlyn Parsons, RN  Chest Size (in):  44 Cup Size: n/a  Height: 5\' 11"  (1.803 m)  Weight:  233 lb (105.688 kg)  BMI:  Body mass index is 32.50 kg/(m^2). Tech Comments:  Lopressor taken at 0600    Nuclear Med Study 1 or 2 day study: 1 day  Stress Test Type:  Stress  Reading MD: Charlton Haws, MD  Order Authorizing Provider:  Melany Guernsey  Resting Radionuclide: Technetium 9m Sestamibi  Resting Radionuclide Dose: 11.0 mCi   Stress Radionuclide:  Technetium 74m Sestamibi  Stress Radionuclide Dose: 33.0 mCi           Stress Protocol Rest HR: 55 Stress HR: 141  Rest BP: 125/84 Stress BP: 174/96  Exercise Time (min): 9:02 METS: 10.10   Predicted Max HR: 155 bpm % Max HR: 90.97 bpm Rate Pressure Product: 21308    Dose of Adenosine (mg):  n/a Dose of Lexiscan: n/a mg  Dose of Atropine (mg): n/a Dose of Dobutamine: n/a mcg/kg/min (at max HR)  Stress Test Technologist: Frederick Peers, EMT-P  Nuclear Technologist:  Domenic Polite, CNMT     Rest Procedure:  Myocardial perfusion imaging was performed at rest 45 minutes following the intravenous administration of Technetium 3m Sestamibi. Rest ECG: NSR - Normal  EKG  Stress Procedure:  The patient exercised on the treadmill utilizing the Bruce Protocol for 9:02 minutes. The patient stopped due to SOB and denied any chest pain.  Technetium 39m Sestamibi was injected at peak exercise and myocardial perfusion imaging was performed after a brief delay. Stress ECG: No significant change from baseline ECG  QPS Raw Data Images:  Patient motion noted. Stress Images:  Normal homogeneous uptake in all areas of the myocardium. Rest Images:  Normal homogeneous uptake in all areas of the myocardium. Subtraction (SDS):  Normal Transient Ischemic Dilatation (Normal <1.22):  0.93 Lung/Heart Ratio (Normal <0.45):  0.37  Quantitative Gated Spect Images QGS EDV:  92 ml QGS ESV:  41 ml  Impression Exercise Capacity:  Fair exercise capacity. BP Response:  Normal blood pressure response. Clinical Symptoms:  There is dyspnea. ECG Impression:  No significant ST segment change suggestive of ischemia. Comparison with Prior Nuclear Study: No images to compare  Overall Impression:  Normal stress nuclear study.  LV Ejection Fraction: 55%.  LV Wall Motion:  NL LV Function; NL Wall Motion  Charlton Haws

## 2012-08-06 ENCOUNTER — Telehealth: Payer: Self-pay | Admitting: Cardiology

## 2012-08-06 NOTE — Telephone Encounter (Signed)
New Problem:    Patient called in wanting to know the results of his latest Stress Test.  Please call back.

## 2012-08-06 NOTE — Telephone Encounter (Signed)
Results reviewed with pt and sent to Dr Lenord Fellers as well.  Pt will follow up with his PCP

## 2012-08-10 ENCOUNTER — Other Ambulatory Visit: Payer: BC Managed Care – PPO | Admitting: Internal Medicine

## 2012-08-10 DIAGNOSIS — Z0000 Encounter for general adult medical examination without abnormal findings: Secondary | ICD-10-CM

## 2012-08-10 DIAGNOSIS — Z125 Encounter for screening for malignant neoplasm of prostate: Secondary | ICD-10-CM

## 2012-08-10 LAB — CBC WITH DIFFERENTIAL/PLATELET
Basophils Absolute: 0.1 10*3/uL (ref 0.0–0.1)
Basophils Relative: 1 % (ref 0–1)
Eosinophils Absolute: 0.2 10*3/uL (ref 0.0–0.7)
Eosinophils Relative: 3 % (ref 0–5)
HCT: 40.5 % (ref 39.0–52.0)
Hemoglobin: 14 g/dL (ref 13.0–17.0)
Lymphocytes Relative: 36 % (ref 12–46)
Lymphs Abs: 2.2 10*3/uL (ref 0.7–4.0)
MCH: 32.6 pg (ref 26.0–34.0)
MCHC: 34.6 g/dL (ref 30.0–36.0)
MCV: 94.4 fL (ref 78.0–100.0)
Monocytes Absolute: 0.6 10*3/uL (ref 0.1–1.0)
Monocytes Relative: 11 % (ref 3–12)
Neutro Abs: 3.1 10*3/uL (ref 1.7–7.7)
Neutrophils Relative %: 49 % (ref 43–77)
Platelets: 203 10*3/uL (ref 150–400)
RBC: 4.29 MIL/uL (ref 4.22–5.81)
RDW: 13.3 % (ref 11.5–15.5)
WBC: 6.1 10*3/uL (ref 4.0–10.5)

## 2012-08-10 LAB — COMPREHENSIVE METABOLIC PANEL
ALT: 16 U/L (ref 0–53)
AST: 23 U/L (ref 0–37)
Albumin: 4.2 g/dL (ref 3.5–5.2)
Alkaline Phosphatase: 61 U/L (ref 39–117)
BUN: 20 mg/dL (ref 6–23)
CO2: 27 mEq/L (ref 19–32)
Calcium: 9.6 mg/dL (ref 8.4–10.5)
Chloride: 103 mEq/L (ref 96–112)
Creat: 1.4 mg/dL — ABNORMAL HIGH (ref 0.50–1.35)
Glucose, Bld: 95 mg/dL (ref 70–99)
Potassium: 4.6 mEq/L (ref 3.5–5.3)
Sodium: 137 mEq/L (ref 135–145)
Total Bilirubin: 0.5 mg/dL (ref 0.3–1.2)
Total Protein: 7.2 g/dL (ref 6.0–8.3)

## 2012-08-10 LAB — LIPID PANEL
Cholesterol: 204 mg/dL — ABNORMAL HIGH (ref 0–200)
HDL: 43 mg/dL (ref >39)
LDL Cholesterol: 139 mg/dL — ABNORMAL HIGH (ref 0–99)
Total CHOL/HDL Ratio: 4.7 Ratio
Triglycerides: 111 mg/dL (ref <150)
VLDL: 22 mg/dL (ref 0–40)

## 2012-08-10 LAB — PSA: PSA: 1.04 ng/mL (ref <=4.00)

## 2012-08-14 ENCOUNTER — Encounter (INDEPENDENT_AMBULATORY_CARE_PROVIDER_SITE_OTHER): Payer: BC Managed Care – PPO | Admitting: Ophthalmology

## 2012-08-14 DIAGNOSIS — H43819 Vitreous degeneration, unspecified eye: Secondary | ICD-10-CM

## 2012-08-14 DIAGNOSIS — H2510 Age-related nuclear cataract, unspecified eye: Secondary | ICD-10-CM

## 2012-08-14 DIAGNOSIS — H33309 Unspecified retinal break, unspecified eye: Secondary | ICD-10-CM

## 2012-08-14 DIAGNOSIS — I10 Essential (primary) hypertension: Secondary | ICD-10-CM

## 2012-08-14 DIAGNOSIS — H35039 Hypertensive retinopathy, unspecified eye: Secondary | ICD-10-CM

## 2012-08-17 ENCOUNTER — Ambulatory Visit: Payer: BC Managed Care – PPO | Admitting: Cardiology

## 2012-08-21 ENCOUNTER — Ambulatory Visit (INDEPENDENT_AMBULATORY_CARE_PROVIDER_SITE_OTHER): Payer: BC Managed Care – PPO | Admitting: Internal Medicine

## 2012-08-21 ENCOUNTER — Encounter: Payer: Self-pay | Admitting: Internal Medicine

## 2012-08-21 VITALS — BP 108/78 | HR 68 | Temp 98.3°F | Ht 70.25 in | Wt 238.0 lb

## 2012-08-21 DIAGNOSIS — R6889 Other general symptoms and signs: Secondary | ICD-10-CM

## 2012-08-21 DIAGNOSIS — Z8659 Personal history of other mental and behavioral disorders: Secondary | ICD-10-CM

## 2012-08-21 DIAGNOSIS — Z8739 Personal history of other diseases of the musculoskeletal system and connective tissue: Secondary | ICD-10-CM

## 2012-08-21 DIAGNOSIS — Z86010 Personal history of colonic polyps: Secondary | ICD-10-CM

## 2012-08-21 DIAGNOSIS — Z0000 Encounter for general adult medical examination without abnormal findings: Secondary | ICD-10-CM

## 2012-08-21 DIAGNOSIS — R6882 Decreased libido: Secondary | ICD-10-CM

## 2012-08-21 DIAGNOSIS — I10 Essential (primary) hypertension: Secondary | ICD-10-CM

## 2012-08-21 DIAGNOSIS — Z862 Personal history of diseases of the blood and blood-forming organs and certain disorders involving the immune mechanism: Secondary | ICD-10-CM

## 2012-08-21 LAB — POCT URINALYSIS DIPSTICK
Bilirubin, UA: NEGATIVE
Blood, UA: NEGATIVE
Glucose, UA: NEGATIVE
Ketones, UA: NEGATIVE
Leukocytes, UA: NEGATIVE
Nitrite, UA: NEGATIVE
Protein, UA: NEGATIVE
Spec Grav, UA: 1.015
Urobilinogen, UA: NEGATIVE
pH, UA: 7

## 2012-08-21 LAB — CREATININE, SERUM: Creat: 1.39 mg/dL — ABNORMAL HIGH (ref 0.50–1.35)

## 2012-08-22 LAB — HEMOGLOBIN A1C
Hgb A1c MFr Bld: 5.9 % — ABNORMAL HIGH (ref <5.7)
Mean Plasma Glucose: 123 mg/dL — ABNORMAL HIGH (ref <117)

## 2012-08-28 ENCOUNTER — Ambulatory Visit: Payer: BC Managed Care – PPO | Admitting: Cardiology

## 2012-09-05 ENCOUNTER — Other Ambulatory Visit: Payer: Self-pay

## 2012-09-11 ENCOUNTER — Ambulatory Visit (INDEPENDENT_AMBULATORY_CARE_PROVIDER_SITE_OTHER): Payer: BC Managed Care – PPO | Admitting: Ophthalmology

## 2012-09-11 DIAGNOSIS — H33309 Unspecified retinal break, unspecified eye: Secondary | ICD-10-CM

## 2012-11-24 ENCOUNTER — Other Ambulatory Visit: Payer: Self-pay | Admitting: Internal Medicine

## 2013-01-14 ENCOUNTER — Ambulatory Visit (INDEPENDENT_AMBULATORY_CARE_PROVIDER_SITE_OTHER): Payer: Self-pay | Admitting: Ophthalmology

## 2013-01-15 ENCOUNTER — Ambulatory Visit (INDEPENDENT_AMBULATORY_CARE_PROVIDER_SITE_OTHER): Payer: BC Managed Care – PPO | Admitting: Ophthalmology

## 2013-01-15 DIAGNOSIS — H33309 Unspecified retinal break, unspecified eye: Secondary | ICD-10-CM

## 2013-01-15 DIAGNOSIS — H35039 Hypertensive retinopathy, unspecified eye: Secondary | ICD-10-CM

## 2013-01-15 DIAGNOSIS — I10 Essential (primary) hypertension: Secondary | ICD-10-CM

## 2013-01-15 DIAGNOSIS — H43819 Vitreous degeneration, unspecified eye: Secondary | ICD-10-CM

## 2013-01-15 DIAGNOSIS — H2510 Age-related nuclear cataract, unspecified eye: Secondary | ICD-10-CM

## 2013-01-18 ENCOUNTER — Telehealth: Payer: Self-pay | Admitting: Internal Medicine

## 2013-01-18 DIAGNOSIS — M549 Dorsalgia, unspecified: Secondary | ICD-10-CM

## 2013-01-18 NOTE — Telephone Encounter (Signed)
Patient will call us back if this doesn't help.  Pt verbalized understanding of instructions.
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
## 2013-01-23 NOTE — Patient Instructions (Addendum)
Continue same medications and return in one year. Keep an eye on your blood pressure.

## 2013-05-21 ENCOUNTER — Ambulatory Visit (INDEPENDENT_AMBULATORY_CARE_PROVIDER_SITE_OTHER): Payer: BC Managed Care – PPO | Admitting: Internal Medicine

## 2013-05-21 DIAGNOSIS — Z23 Encounter for immunization: Secondary | ICD-10-CM

## 2013-06-02 ENCOUNTER — Encounter (INDEPENDENT_AMBULATORY_CARE_PROVIDER_SITE_OTHER): Payer: BC Managed Care – PPO | Admitting: Ophthalmology

## 2013-06-02 DIAGNOSIS — H35039 Hypertensive retinopathy, unspecified eye: Secondary | ICD-10-CM

## 2013-06-02 DIAGNOSIS — H33309 Unspecified retinal break, unspecified eye: Secondary | ICD-10-CM

## 2013-06-02 DIAGNOSIS — H43819 Vitreous degeneration, unspecified eye: Secondary | ICD-10-CM

## 2013-06-02 DIAGNOSIS — I10 Essential (primary) hypertension: Secondary | ICD-10-CM

## 2013-06-02 DIAGNOSIS — G43919 Migraine, unspecified, intractable, without status migrainosus: Secondary | ICD-10-CM

## 2013-06-02 DIAGNOSIS — H2510 Age-related nuclear cataract, unspecified eye: Secondary | ICD-10-CM

## 2013-06-25 ENCOUNTER — Other Ambulatory Visit: Payer: Self-pay | Admitting: Internal Medicine

## 2013-07-02 ENCOUNTER — Ambulatory Visit (INDEPENDENT_AMBULATORY_CARE_PROVIDER_SITE_OTHER): Payer: BC Managed Care – PPO | Admitting: Neurology

## 2013-07-02 ENCOUNTER — Encounter: Payer: Self-pay | Admitting: Neurology

## 2013-07-02 VITALS — BP 133/81 | HR 57 | Ht 71.0 in | Wt 230.0 lb

## 2013-07-02 DIAGNOSIS — H539 Unspecified visual disturbance: Secondary | ICD-10-CM
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
## 2013-07-08 ENCOUNTER — Ambulatory Visit (INDEPENDENT_AMBULATORY_CARE_PROVIDER_SITE_OTHER): Payer: BC Managed Care – PPO

## 2013-07-08 DIAGNOSIS — H539 Unspecified visual disturbance: Secondary | ICD-10-CM

## 2013-07-09 ENCOUNTER — Telehealth: Payer: Self-pay | Admitting: Neurology

## 2013-07-09 NOTE — Telephone Encounter (Signed)
I have called him, MRI brain showed nonspecific tiny subcortical and periventricular white matter hyperintensities most consistent with small vessel disease,  He is to continue a daily aspirin, Ultrasound of carotid arteries pending  Annabelle Harman, place gave him a followup appointment in February 2015

## 2013-07-09 NOTE — Telephone Encounter (Signed)
Please mail a copy of MRI report to him.

## 2013-07-12 NOTE — Telephone Encounter (Signed)
Mailed results...Marland Kitchenas

## 2013-07-13 ENCOUNTER — Ambulatory Visit (INDEPENDENT_AMBULATORY_CARE_PROVIDER_SITE_OTHER): Payer: BC Managed Care – PPO

## 2013-07-13 DIAGNOSIS — H539 Unspecified visual disturbance: Secondary | ICD-10-CM

## 2013-07-13 DIAGNOSIS — H5310 Unspecified subjective visual disturbances: Secondary | ICD-10-CM

## 2013-09-03 ENCOUNTER — Encounter: Payer: Self-pay | Admitting: Internal Medicine

## 2013-09-03 ENCOUNTER — Ambulatory Visit (INDEPENDENT_AMBULATORY_CARE_PROVIDER_SITE_OTHER): Payer: BC Managed Care – PPO | Admitting: Internal Medicine

## 2013-09-03 VITALS — BP 124/90 | HR 60 | Temp 97.6°F | Ht 70.0 in | Wt 244.0 lb

## 2013-09-03 DIAGNOSIS — I679 Cerebrovascular disease, unspecified: Secondary | ICD-10-CM

## 2013-09-03 DIAGNOSIS — R6882 Decreased libido: Secondary | ICD-10-CM

## 2013-09-03 DIAGNOSIS — Z8719 Personal history of other diseases of the digestive system: Secondary | ICD-10-CM

## 2013-09-03 DIAGNOSIS — H539 Unspecified visual disturbance: Secondary | ICD-10-CM

## 2013-09-03 DIAGNOSIS — Z125 Encounter for screening for malignant neoplasm of prostate: Secondary | ICD-10-CM

## 2013-09-03 DIAGNOSIS — Z86010 Personal history of colonic polyps: Secondary | ICD-10-CM

## 2013-09-03 DIAGNOSIS — Z8659 Personal history of other mental and behavioral disorders: Secondary | ICD-10-CM

## 2013-09-03 DIAGNOSIS — I10 Essential (primary) hypertension: Secondary | ICD-10-CM

## 2013-09-03 DIAGNOSIS — Z13220 Encounter for screening for lipoid disorders: Secondary | ICD-10-CM

## 2013-09-03 DIAGNOSIS — Z0000 Encounter for general adult medical examination without abnormal findings: Secondary | ICD-10-CM

## 2013-09-03 DIAGNOSIS — Z130 Encounter for screening for diseases of the blood and blood-forming organs and certain disorders involving the immune mechanism: Secondary | ICD-10-CM

## 2013-09-03 LAB — CBC WITH DIFFERENTIAL/PLATELET
Basophils Absolute: 0 10*3/uL (ref 0.0–0.1)
Basophils Relative: 0 % (ref 0–1)
Eosinophils Absolute: 0.2 10*3/uL (ref 0.0–0.7)
Eosinophils Relative: 3 % (ref 0–5)
HCT: 39.6 % (ref 39.0–52.0)
Hemoglobin: 13.5 g/dL (ref 13.0–17.0)
Lymphocytes Relative: 38 % (ref 12–46)
Lymphs Abs: 2.5 10*3/uL (ref 0.7–4.0)
MCH: 32.8 pg (ref 26.0–34.0)
MCHC: 34.1 g/dL (ref 30.0–36.0)
MCV: 96.4 fL (ref 78.0–100.0)
Monocytes Absolute: 0.5 10*3/uL (ref 0.1–1.0)
Monocytes Relative: 8 % (ref 3–12)
Neutro Abs: 3.3 10*3/uL (ref 1.7–7.7)
Neutrophils Relative %: 51 % (ref 43–77)
Platelets: 214 10*3/uL (ref 150–400)
RBC: 4.11 MIL/uL — ABNORMAL LOW (ref 4.22–5.81)
RDW: 13.8 % (ref 11.5–15.5)
WBC: 6.4 10*3/uL (ref 4.0–10.5)

## 2013-09-03 LAB — POCT URINALYSIS DIPSTICK
Bilirubin, UA: NEGATIVE
Blood, UA: NEGATIVE
Glucose, UA: NEGATIVE
Ketones, UA: NEGATIVE
Leukocytes, UA: NEGATIVE
Nitrite, UA: NEGATIVE
Protein, UA: NEGATIVE
Spec Grav, UA: 1.02
Urobilinogen, UA: NEGATIVE
pH, UA: 6.5

## 2013-09-03 LAB — COMPREHENSIVE METABOLIC PANEL
ALT: 15 U/L (ref 0–53)
AST: 22 U/L (ref 0–37)
Albumin: 4.2 g/dL (ref 3.5–5.2)
Alkaline Phosphatase: 49 U/L (ref 39–117)
BUN: 15 mg/dL (ref 6–23)
CO2: 28 mEq/L (ref 19–32)
Calcium: 9.7 mg/dL (ref 8.4–10.5)
Chloride: 101 mEq/L (ref 96–112)
Creat: 1.37 mg/dL — ABNORMAL HIGH (ref 0.50–1.35)
Glucose, Bld: 95 mg/dL (ref 70–99)
Potassium: 4.6 mEq/L (ref 3.5–5.3)
Sodium: 135 mEq/L (ref 135–145)
Total Bilirubin: 0.7 mg/dL (ref 0.2–1.2)
Total Protein: 7.5 g/dL (ref 6.0–8.3)

## 2013-09-03 LAB — LIPID PANEL
Cholesterol: 188 mg/dL (ref 0–200)
HDL: 43 mg/dL (ref >39)
LDL Cholesterol: 117 mg/dL — ABNORMAL HIGH (ref 0–99)
Total CHOL/HDL Ratio: 4.4 Ratio
Triglycerides: 140 mg/dL (ref <150)
VLDL: 28 mg/dL (ref 0–40)

## 2013-09-04 LAB — PSA: PSA: 0.75 ng/mL (ref <=4.00)

## 2013-09-09 ENCOUNTER — Telehealth: Payer: Self-pay | Admitting: Neurology

## 2013-09-29 NOTE — Telephone Encounter (Signed)
Patient has an appt already.

## 2013-10-04 ENCOUNTER — Other Ambulatory Visit: Payer: Self-pay

## 2013-10-04 MED ORDER — METOPROLOL SUCCINATE ER 25 MG PO TB24: 25.0000 mg | ORAL_TABLET | Freq: Every day | ORAL | Status: DC

## 2013-10-08 ENCOUNTER — Encounter: Payer: Self-pay | Admitting: Neurology

## 2013-10-08 ENCOUNTER — Ambulatory Visit (INDEPENDENT_AMBULATORY_CARE_PROVIDER_SITE_OTHER): Payer: BC Managed Care – PPO | Admitting: Neurology

## 2013-10-08 VITALS — BP 124/81 | HR 62 | Ht 71.0 in | Wt 246.0 lb

## 2013-10-08 DIAGNOSIS — F329 Major depressive disorder, single episode, unspecified: Secondary | ICD-10-CM

## 2013-10-08 DIAGNOSIS — F3289 Other specified depressive episodes: Secondary | ICD-10-CM

## 2013-10-08 DIAGNOSIS — F32A Depression, unspecified: Secondary | ICD-10-CM

## 2013-10-08 MED ORDER — CLOPIDOGREL BISULFATE 75 MG PO TABS: 75.0000 mg | ORAL_TABLET | Freq: Every day | ORAL | Status: DC
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
## 2013-12-17 ENCOUNTER — Encounter: Payer: Self-pay | Admitting: Internal Medicine

## 2013-12-17 ENCOUNTER — Ambulatory Visit (INDEPENDENT_AMBULATORY_CARE_PROVIDER_SITE_OTHER): Payer: BC Managed Care – PPO | Admitting: Internal Medicine

## 2013-12-17 VITALS — BP 108/74 | HR 68 | Temp 99.0°F | Wt 244.0 lb

## 2013-12-17 DIAGNOSIS — R0609 Other forms of dyspnea: Secondary | ICD-10-CM

## 2013-12-17 DIAGNOSIS — G4730 Sleep apnea, unspecified: Secondary | ICD-10-CM

## 2013-12-17 DIAGNOSIS — R0683 Snoring: Secondary | ICD-10-CM

## 2013-12-17 DIAGNOSIS — R0989 Other specified symptoms and signs involving the circulatory and respiratory systems: Secondary | ICD-10-CM

## 2013-12-17 NOTE — Patient Instructions (Signed)
We'll will be made the pulmonologist to evaluate for possible sleep apnea. Physical exam due February 2016.

## 2013-12-17 NOTE — Progress Notes (Signed)
   Subjective:    Patient ID: [name]., male    DOB: 09/17/1945, 68 y.o.   MRN: [id number]  HPI In today to discuss issues with snoring and apnea. His wife is a Marine scientist and she says he stops breathing at night in addition to snoring. Apparently this is been going on for couple of years. He is overweight. Is recently started exercise on the treadmill. He has a history of depression, gout, colon polyps. Says he is tired a lot. However he drives to [place] to work from [place] 4 days a week.   In December he was evaluated by a neurologist because of an episode of visual field defect on the left in August 2014 lasting some 10 or 15 minutes. He was seen by Dr. Arlyn Dunning. He had an MRI of the brain showing tiny scattered periventricular  and subcortical nonspecific white matter hyperintensities. Most likely this is small vessel disease. It's unlikely he has to myelinating disease. He says neurologist told him there were a few more than usual present. He's concerned about this. His blood pressure is under excellent control. He is on metoprolol. He was placed on Plavix by neurologist.  He has a history of glucose intolerance and decreased libido. History of diverticulitis requiring hospitalization 2007.  He is maintained on a beta blocker as suggested by cardiologist when he was evaluated for chest discomfort in January 2014. Cardiologist noted a mild elevated blood pressure at the time. He had a negative stress test. He had a negative cardiac catheterization in 2001.    Review of Systems     Objective:   Physical Exam  Spent 15 minutes speaking with patient about these issues. He needs to be referred to pulmonologist for evaluation of possible sleep apnea      Assessment & Plan:  Probable sleep apnea  Obesity  Visual field disturbance on left seen by a neurologist December 2014 now on Plavix  History of depression  Plan: Appointment with pulmonologist for about a whitish a  possible sleep apnea.

## 2013-12-21 ENCOUNTER — Telehealth: Payer: Self-pay

## 2013-12-21 NOTE — Telephone Encounter (Signed)
Patient is scheduled with Dr. Sabino Donovan on 01/14/2014 at 3:30pm. He is aware of this.

## 2014-01-14 ENCOUNTER — Institutional Professional Consult (permissible substitution): Payer: BC Managed Care – PPO | Admitting: Pulmonary Disease

## 2014-01-28 ENCOUNTER — Ambulatory Visit (INDEPENDENT_AMBULATORY_CARE_PROVIDER_SITE_OTHER): Payer: BC Managed Care – PPO | Admitting: Ophthalmology

## 2014-02-18 ENCOUNTER — Institutional Professional Consult (permissible substitution): Payer: BC Managed Care – PPO | Admitting: Pulmonary Disease

## 2014-02-27 DIAGNOSIS — I10 Essential (primary) hypertension: Secondary | ICD-10-CM | POA: Insufficient documentation

## 2014-02-27 DIAGNOSIS — I679 Cerebrovascular disease, unspecified: Secondary | ICD-10-CM | POA: Insufficient documentation

## 2014-02-27 NOTE — Patient Instructions (Signed)
Continue Plavix . Continue metoprolol. Return in one year or as needed.
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
## 2014-03-21 ENCOUNTER — Institutional Professional Consult (permissible substitution): Payer: BC Managed Care – PPO | Admitting: Pulmonary Disease

## 2014-03-25 ENCOUNTER — Ambulatory Visit (INDEPENDENT_AMBULATORY_CARE_PROVIDER_SITE_OTHER): Payer: Medicare Other | Admitting: Internal Medicine

## 2014-03-25 ENCOUNTER — Ambulatory Visit
Admission: RE | Admit: 2014-03-25 | Discharge: 2014-03-25 | Disposition: A | Discharge status: Home / Self Care | Payer: Medicare Other | Source: Ambulatory Visit | Attending: Internal Medicine | Admitting: Internal Medicine

## 2014-03-25 ENCOUNTER — Encounter: Payer: Self-pay | Admitting: Internal Medicine

## 2014-03-25 VITALS — BP 110/72 | HR 80 | Temp 101.9°F | Wt 237.0 lb

## 2014-03-25 DIAGNOSIS — R05 Cough: Secondary | ICD-10-CM

## 2014-03-25 DIAGNOSIS — R509 Fever, unspecified: Secondary | ICD-10-CM | POA: Diagnosis not present

## 2014-03-25 DIAGNOSIS — R059 Cough, unspecified: Secondary | ICD-10-CM

## 2014-03-25 LAB — CBC WITH DIFFERENTIAL/PLATELET
Basophils Absolute: 0 10*3/uL (ref 0.0–0.1)
Eosinophils Absolute: 0 10*3/uL (ref 0.0–0.7)
HCT: 41.4 % (ref 39.0–52.0)
Hemoglobin: 13.2 g/dL (ref 13.0–17.0)
Lymphocytes Relative: 9 % — ABNORMAL LOW (ref 12–46)
Lymphs Abs: 0.9 10*3/uL (ref 0.7–4.0)
MCH: 32.8 pg (ref 26.0–34.0)
MCHC: 31.9 g/dL (ref 30.0–36.0)
MCV: 102.8 fL — ABNORMAL HIGH (ref 78.0–100.0)
Monocytes Absolute: 0.4 10*3/uL (ref 0.1–1.0)
Monocytes Relative: 4 % (ref 3–12)
Neutro Abs: 8.8 10*3/uL — ABNORMAL HIGH (ref 1.7–7.7)
Neutrophils Relative %: 87 % — ABNORMAL HIGH (ref 43–77)
Platelets: 160 10*3/uL (ref 150–400)
RBC: 4.03 MIL/uL — ABNORMAL LOW (ref 4.22–5.81)
RDW: 13 % (ref 11.5–15.5)
WBC: 10.1 10*3/uL (ref 4.0–10.5)

## 2014-03-25 MED ORDER — CLARITHROMYCIN 500 MG PO TABS: 500.0000 mg | ORAL_TABLET | Freq: Two times a day (BID) | ORAL | Status: DC

## 2014-03-25 NOTE — Patient Instructions (Addendum)
  Take Biaxin 500 mg twice daily for 10 days. Tylenol as needed for fever.

## 2014-03-25 NOTE — Progress Notes (Signed)
   Subjective:    Patient ID: [name]., male    DOB: [birth year], 68 y.o.   MRN: [id number]  HPI  Onset yesterday of chills. No complaint of sore throat or URI symptoms. Has had slight cough which is nonproductive. No sore throat. Says posterior cervical glands have been sore.    Review of Systems     Objective:   Physical Exam Face is flushed. TMs are clear. Pharynx is clear. Neck is supple. Has some tenderness and posterior cervical areas bilaterally but no enlarged lymph nodes. Chest decreased breath sounds left lower lobe. Chest x-ray is negative. CBC drawn.       Assessment & Plan:  Acute URI  Plan:Biaxin 500 mg bid x 10 days. CBC with diff is WNL.

## 2014-03-29 ENCOUNTER — Ambulatory Visit (INDEPENDENT_AMBULATORY_CARE_PROVIDER_SITE_OTHER): Payer: Medicare Other | Admitting: Internal Medicine

## 2014-03-29 ENCOUNTER — Encounter: Payer: Self-pay | Admitting: Internal Medicine

## 2014-03-29 VITALS — BP 110/72 | HR 80 | Temp 98.6°F | Wt 237.0 lb

## 2014-03-29 DIAGNOSIS — R509 Fever, unspecified: Secondary | ICD-10-CM

## 2014-03-29 LAB — CBC WITH DIFFERENTIAL/PLATELET
Basophils Absolute: 0.1 10*3/uL (ref 0.0–0.1)
Basophils Relative: 1 % (ref 0–1)
Eosinophils Absolute: 0.1 10*3/uL (ref 0.0–0.7)
Eosinophils Relative: 1 % (ref 0–5)
HCT: 36.9 % — ABNORMAL LOW (ref 39.0–52.0)
Hemoglobin: 12.7 g/dL — ABNORMAL LOW (ref 13.0–17.0)
Lymphocytes Relative: 39 % (ref 12–46)
Lymphs Abs: 2.4 10*3/uL (ref 0.7–4.0)
MCH: 33.8 pg (ref 26.0–34.0)
MCHC: 34.4 g/dL (ref 30.0–36.0)
MCV: 98.1 fL (ref 78.0–100.0)
Monocytes Absolute: 0.5 10*3/uL (ref 0.1–1.0)
Monocytes Relative: 9 % (ref 3–12)
Neutro Abs: 3.1 10*3/uL (ref 1.7–7.7)
Neutrophils Relative %: 50 % (ref 43–77)
Platelets: 207 10*3/uL (ref 150–400)
RBC: 3.76 MIL/uL — ABNORMAL LOW (ref 4.22–5.81)
RDW: 13 % (ref 11.5–15.5)
WBC: 6.1 10*3/uL (ref 4.0–10.5)

## 2014-03-29 NOTE — Progress Notes (Signed)
   Subjective:    Patient ID: Wesley Guadalajara., male    DOB: 1946-03-27, 68 y.o.   MRN: [id number]  HPI  He was here on Friday September 4  with fever and respiratory congestion. Apparently  had onset the day before. White blood cell count was within normal limits. Chest x-ray was negative for pneumonia. He had malaise fatigue fever and chills. Was complaining of some ear congestion. Could not obtain rapid strep screen because of strong gag reflex.  He called morning saying he was still sick with fever up to 101. He was placed on Biaxin on September 4. Now says that he actually has some improvement in his respiratory symptoms. He still has some left ear pain. Complaining of blisters on his lower lip.    Review of Systems     Objective:   Physical Exam  Multiple vesicular lesions on lower lip. Left TM is dull. Right TM is clear. Neck is supple. Chest clear. CBC was repeated today      Assessment & Plan:  Herpetic stomatitis-likely due to viral syndrome  Left otitis media  Plan: Complete course of Biaxin as prescribed. Add Zovirax 200 mg 5 times daily for 10 days.

## 2014-03-29 NOTE — Patient Instructions (Signed)
Takes Zovirax 200 mg 5 times daily. Finish Biaxin. Take Tylenol for fever.

## 2014-04-05 ENCOUNTER — Telehealth: Payer: Self-pay

## 2014-04-05 NOTE — Telephone Encounter (Signed)
Patient advised of lab results and appointment to have b12 checked made.

## 2014-04-05 NOTE — Telephone Encounter (Signed)
Message copied by Amado Coe on Tue Apr 05, 2014  4:12 PM ------      Message from: [name]      Created: Tue Mar 29, 2014  2:44 PM       Please call pt WBC is normal at 6,100 ------

## 2014-04-12 ENCOUNTER — Other Ambulatory Visit: Payer: Medicare Other | Admitting: Internal Medicine

## 2014-04-12 DIAGNOSIS — R5383 Other fatigue: Principal | ICD-10-CM

## 2014-04-12 DIAGNOSIS — R5381 Other malaise: Secondary | ICD-10-CM

## 2014-04-12 NOTE — Addendum Note (Signed)
Addended by: Amado Coe on: 04/12/2014 09:29 AM   Modules accepted: Orders

## 2014-04-13 ENCOUNTER — Telehealth: Payer: Self-pay

## 2014-04-13 LAB — VITAMIN B12: Vitamin B-12: 624 pg/mL (ref 211–911)

## 2014-04-13 NOTE — Telephone Encounter (Signed)
Patient aware of normal B12 lab results.

## 2014-04-13 NOTE — Telephone Encounter (Signed)
Message copied by Amado Coe on Wed Apr 13, 2014  9:00 AM ------      Message from: [name]      Created: Wed Apr 13, 2014  8:54 AM       Please call patient. B12 level is normal      ----- Message -----         From: Lab in [facility]         Sent: 04/13/2014   3:08 AM           To: [name], MD                   ------

## 2014-04-20 ENCOUNTER — Telehealth: Payer: Self-pay | Admitting: Neurology

## 2014-04-20 ENCOUNTER — Telehealth: Payer: Self-pay | Admitting: Internal Medicine

## 2014-04-20 MED ORDER — CLOPIDOGREL BISULFATE 75 MG PO TABS
75.0000 mg | ORAL_TABLET | Freq: Every day | ORAL | Status: DC
Start: 2014-04-20 — End: 2014-05-09

## 2014-04-20 NOTE — Telephone Encounter (Signed)
Patient requesting 30 day supply for clopidogrel (PLAVIX) 75 MG tablet.  Please send to [facility]  Until he's set up with mail order with new MCR.  Please call anytime and may leave detailed message on voicemail.

## 2014-04-20 NOTE — Telephone Encounter (Signed)
Needs 30 day supply  [facility] [address]

## 2014-04-20 NOTE — Telephone Encounter (Signed)
Rx has been sent.  I called the patient.  He is aware.

## 2014-04-22 ENCOUNTER — Other Ambulatory Visit: Payer: Self-pay

## 2014-04-22 MED ORDER — METOPROLOL SUCCINATE ER 25 MG PO TB24: 25.0000 mg | ORAL_TABLET | Freq: Every day | ORAL | Status: DC

## 2014-04-22 NOTE — Telephone Encounter (Signed)
[facility] patient aware.

## 2014-04-25 NOTE — Telephone Encounter (Signed)
Message was not routed to the pool to be handled.  Patient called back and Larene Beach handled his refill.

## 2014-05-06 ENCOUNTER — Other Ambulatory Visit: Payer: Self-pay

## 2014-05-06 MED ORDER — METOPROLOL SUCCINATE ER 25 MG PO TB24: 25.0000 mg | ORAL_TABLET | Freq: Every day | ORAL | Status: DC

## 2014-05-09 ENCOUNTER — Other Ambulatory Visit: Payer: Self-pay

## 2014-05-09 MED ORDER — CLOPIDOGREL BISULFATE 75 MG PO TABS
75.0000 mg | ORAL_TABLET | Freq: Every day | ORAL | Status: DC
Start: 2014-05-09 — End: 2014-11-14

## 2014-05-18 ENCOUNTER — Other Ambulatory Visit: Payer: Self-pay | Admitting: Internal Medicine

## 2014-05-31 ENCOUNTER — Encounter: Payer: Self-pay | Admitting: Pulmonary Disease

## 2014-05-31 ENCOUNTER — Ambulatory Visit (INDEPENDENT_AMBULATORY_CARE_PROVIDER_SITE_OTHER): Payer: Medicare Other | Admitting: Pulmonary Disease

## 2014-05-31 VITALS — BP 112/64 | HR 66 | Temp 98.8°F | Ht 71.0 in | Wt 238.4 lb

## 2014-05-31 DIAGNOSIS — G4733 Obstructive sleep apnea (adult) (pediatric): Secondary | ICD-10-CM | POA: Insufficient documentation

## 2014-05-31 NOTE — Progress Notes (Signed)
Subjective:    Patient ID: [name]., male    DOB: 1946/06/18, 68 y.o.   MRN: [id number]  HPI The patient is a 68 year old male who I've been asked to see for possible obstructive sleep apnea. He has been noted to have loud snoring as well as witnessed apneas by his wife during sleep. He admits to having frequent awakenings at night, and is not rested more than 50% of the mornings upon arising. He stays extremely busy during the day, but he can have sleepiness if he sits in his recliner to watch television. Overall, he feels that his alertness is adequate during the day. He denies any sleepiness with driving. The patient states that his weight is neutral over the last 2 years, and his Epworth score today is only 2.   Sleep Questionnaire What time do you typically go to bed?( Between what hours) 8-10PM 8-10PM at 1358 on 05/31/14 by Inge Rise, CMA How long does it take you to fall asleep? reads for 1 hr then goes to sleep reads for 1 hr then goes to sleep at 1358 on 05/31/14 by Inge Rise, CMA How many times during the night do you wake up? 3 3 at 1358 on 05/31/14 by Inge Rise, [place] What time do you get out of bed to start your day? 0700 0700 at 1358 on 05/31/14 by Inge Rise, CMA Do you drive or operate heavy machinery in your occupation? No No at 1358 on 05/31/14 by Inge Rise, CMA How much has your weight changed (up or down) over the past two years? (In pounds) 0 oz (0 kg) 0 oz (0 kg) at 1358 on 05/31/14 by Inge Rise, CMA Have you ever had a sleep study before? No No at 1358 on 05/31/14 by Inge Rise, CMA Do you currently use CPAP? No No at 1358 on 05/31/14 by Inge Rise, CMA Do you wear oxygen at any time? No No at 1358 on 05/31/14 by Inge Rise, CMA   Review of Systems  Constitutional: Negative for fever and unexpected weight change.  HENT: Negative for congestion, dental problem, ear pain, nosebleeds, postnasal drip, rhinorrhea,  sinus pressure, sneezing, sore throat and trouble swallowing.   Eyes: Negative for redness and itching.  Respiratory: Negative for cough, chest tightness, shortness of breath and wheezing.   Cardiovascular: Negative for palpitations and leg swelling.  Gastrointestinal: Negative for nausea and vomiting.  Genitourinary: Negative for dysuria.  Musculoskeletal: Positive for arthralgias. Negative for joint swelling.  Skin: Negative for rash.  Neurological: Negative for headaches.  Hematological: Does not bruise/bleed easily.  Psychiatric/Behavioral: Negative for dysphoric mood. The patient is not nervous/anxious.        Objective:   Physical Exam Constitutional:  Overweight male, no acute distress  HENT:  Nares patent without discharge  Oropharynx without exudate, palate and uvula are mildly elongated.   Eyes:  Perrla, eomi, no scleral icterus  Neck:  No JVD, no TMG  Cardiovascular:  Normal rate, regular rhythm, no rubs or gallops.  No murmurs        Intact distal pulses  Pulmonary :  Normal breath sounds, no stridor or respiratory distress   No rales, rhonchi, or wheezing  Abdominal:  Soft, nondistended, bowel sounds present.  No tenderness noted.   Musculoskeletal:  1+ lower extremity edema noted, right > left  Lymph Nodes:  No cervical lymphadenopathy noted  Skin:  No cyanosis noted  Neurologic:  Alert,  appropriate, moves all 4 extremities without obvious deficit.         Assessment & Plan:

## 2014-05-31 NOTE — Patient Instructions (Signed)
Will arrange for home sleep testing, and will call once the results are available  

## 2014-05-31 NOTE — Assessment & Plan Note (Signed)
The patient has been noted to have loud snoring as well as witnessed apneas by his bed partner. He also has frequent awakenings, and is unrested in the mornings more than 50% of the time. Although he does have some sleepiness during the day with inactivity, and overall he is satisfied with his alertness. It is unclear if he has clinically significant sleep apnea, and will benefit from a sleep study for evaluation. I have had a long discussion with him about the pathophysiology of sleep apnea, including its impact to his quality of life and cardiovascular health. I think he is an excellent candidate for home sleep testing, and the patient is agreeable to this approach.

## 2014-06-24 ENCOUNTER — Ambulatory Visit (INDEPENDENT_AMBULATORY_CARE_PROVIDER_SITE_OTHER): Payer: Medicare Other | Admitting: Ophthalmology

## 2014-06-24 DIAGNOSIS — H43813 Vitreous degeneration, bilateral: Secondary | ICD-10-CM

## 2014-06-24 DIAGNOSIS — I10 Essential (primary) hypertension: Secondary | ICD-10-CM

## 2014-06-24 DIAGNOSIS — H35033 Hypertensive retinopathy, bilateral: Secondary | ICD-10-CM

## 2014-06-24 DIAGNOSIS — H33302 Unspecified retinal break, left eye: Secondary | ICD-10-CM

## 2014-08-03 DIAGNOSIS — G4730 Sleep apnea, unspecified: Secondary | ICD-10-CM | POA: Diagnosis not present

## 2014-08-09 ENCOUNTER — Telehealth: Payer: Self-pay | Admitting: Pulmonary Disease

## 2014-08-09 DIAGNOSIS — G4730 Sleep apnea, unspecified: Secondary | ICD-10-CM | POA: Diagnosis not present

## 2014-08-09 NOTE — Telephone Encounter (Signed)
Pt called back. appt scheduled for friday

## 2014-08-09 NOTE — Telephone Encounter (Signed)
Pt needs ov to review home sleep study

## 2014-08-09 NOTE — Telephone Encounter (Signed)
lmomtcb x1 on both numbers

## 2014-08-10 ENCOUNTER — Encounter: Payer: Self-pay | Admitting: Pulmonary Disease

## 2014-08-12 ENCOUNTER — Ambulatory Visit: Payer: Self-pay | Admitting: Pulmonary Disease

## 2014-08-24 ENCOUNTER — Ambulatory Visit (INDEPENDENT_AMBULATORY_CARE_PROVIDER_SITE_OTHER): Payer: Medicare Other | Admitting: Pulmonary Disease

## 2014-08-24 ENCOUNTER — Encounter: Payer: Self-pay | Admitting: Pulmonary Disease

## 2014-08-24 DIAGNOSIS — G4733 Obstructive sleep apnea (adult) (pediatric): Secondary | ICD-10-CM

## 2014-08-24 NOTE — Progress Notes (Signed)
   Subjective:    Patient ID: Wesley Guadalajara., male    DOB: Aug 31, 1945, 69 y.o.   MRN: [id number]  HPI The patient comes in today for follow-up of his recent home sleep test. He was found to have moderate OSA, with an AHI of 18 events per hour. I have reviewed the study with him in detail, and answered all of his questions.   Review of Systems  Constitutional: Negative for fever and unexpected weight change.  HENT: Negative for congestion, dental problem, ear pain, nosebleeds, postnasal drip, rhinorrhea, sinus pressure, sneezing, sore throat and trouble swallowing.   Eyes: Negative for redness and itching.  Respiratory: Negative for cough, chest tightness, shortness of breath and wheezing.   Cardiovascular: Negative for palpitations and leg swelling.  Gastrointestinal: Negative for nausea and vomiting.  Genitourinary: Negative for dysuria.  Musculoskeletal: Negative for joint swelling.  Skin: Negative for rash.  Neurological: Negative for headaches.  Hematological: Does not bruise/bleed easily.  Psychiatric/Behavioral: Negative for dysphoric mood. The patient is not nervous/anxious.        Objective:   Physical Exam Overweight male in no acute distress Nose without purulence or discharge noted Neck without lymphadenopathy or thyromegaly Lower extremities without significant edema, no cyanosis Alert and oriented, moves all 4 extremities.       Assessment & Plan:

## 2014-08-24 NOTE — Patient Instructions (Signed)
Will start on cpap at a moderate pressure level.  Please call if having tolerance issues. Work on weight reduction followup with me again in 8 weeks.

## 2014-08-24 NOTE — Assessment & Plan Note (Signed)
The patient has moderate obstructive sleep apnea by his recent sleep study, and I have outlined both a conservative and aggressive approach for treatment. He can work on a trial of weight loss alone for the next 4-6 months, versus treating his sleep apnea with a dental appliance or C Pap while he is working on weight loss. The patient has decided to try C Pap, and will refer him to a home care company for set up. I will set the patient up on cpap at a moderate pressure level to allow for desensitization, and will troubleshoot the device over the next 4-6weeks if needed.  The pt is to call me if having issues with tolerance.  Will then optimize the pressure once patient is able to wear cpap on a consistent basis.

## 2014-09-02 DIAGNOSIS — G4733 Obstructive sleep apnea (adult) (pediatric): Secondary | ICD-10-CM | POA: Diagnosis not present

## 2014-09-09 ENCOUNTER — Telehealth: Payer: Self-pay | Admitting: Pulmonary Disease

## 2014-09-09 DIAGNOSIS — G4733 Obstructive sleep apnea (adult) (pediatric): Secondary | ICD-10-CM

## 2014-09-09 NOTE — Telephone Encounter (Signed)
Called and spoke with pt and he stated that he would like the appt to get fitted for the oral appliance.  He stated that he is still having difficulty with the CPAP>  He feels that this may help him.  [facility] please advise if ok to place this order for the pt.  Thanks  No Known Allergies  Current Outpatient Prescriptions on File Prior to Visit  Medication Sig Dispense Refill  . B Complex Vitamins (VITAMIN-B COMPLEX PO) Take 1 tablet by mouth daily.    . Cholecalciferol (VITAMIN D) 1000 UNITS capsule Take 1,000 Units by mouth daily.    . clopidogrel (PLAVIX) 75 MG tablet Take 1 tablet (75 mg total) by mouth daily with breakfast. 90 tablet 1  . Coenzyme Q10 (CO Q 10 PO) Take 500 mg by mouth daily.    . diphenhydramine-acetaminophen (TYLENOL PM) 25-500 MG TABS Take 1 tablet by mouth at bedtime.    . metoprolol succinate (TOPROL-XL) 25 MG 24 hr tablet TAKE 1 TABLET (25 MG TOTAL) BY MOUTH DAILY. 30 tablet 11  . Multiple Vitamins-Minerals (MULTIVITAMIN,TX-MINERALS) tablet Take 1 tablet by mouth daily.      . naproxen sodium (ANAPROX) 220 MG tablet Take 220 mg by mouth 2 (two) times daily with a meal.     No current facility-administered medications on file prior to visit.

## 2014-09-09 NOTE — Telephone Encounter (Signed)
Ok to send order to dme to pick up his cpap machine, and send referral to "orthodontics" for dental appliance with Oneal Grout.

## 2014-09-09 NOTE — Telephone Encounter (Signed)
Order has been placed for the dme for [facility] and for the appt with Dr. Oneal Grout.  Nothing further is needed.  Pt is aware that we will be calling with appt date and time next week.

## 2014-10-05 DIAGNOSIS — H35033 Hypertensive retinopathy, bilateral: Secondary | ICD-10-CM | POA: Diagnosis not present

## 2014-10-05 DIAGNOSIS — H35372 Puckering of macula, left eye: Secondary | ICD-10-CM | POA: Diagnosis not present

## 2014-10-05 DIAGNOSIS — Z961 Presence of intraocular lens: Secondary | ICD-10-CM | POA: Diagnosis not present

## 2014-10-19 ENCOUNTER — Ambulatory Visit: Payer: Medicare Other | Admitting: Pulmonary Disease

## 2014-10-31 DIAGNOSIS — G4733 Obstructive sleep apnea (adult) (pediatric): Secondary | ICD-10-CM | POA: Diagnosis not present

## 2014-11-11 ENCOUNTER — Telehealth: Payer: Self-pay | Admitting: *Deleted

## 2014-11-11 ENCOUNTER — Encounter: Payer: Self-pay | Admitting: Internal Medicine

## 2014-11-11 ENCOUNTER — Ambulatory Visit (INDEPENDENT_AMBULATORY_CARE_PROVIDER_SITE_OTHER): Payer: Medicare Other | Admitting: Internal Medicine

## 2014-11-11 VITALS — BP 122/82 | HR 56 | Temp 97.8°F | Wt 224.5 lb

## 2014-11-11 DIAGNOSIS — R21 Rash and other nonspecific skin eruption: Secondary | ICD-10-CM | POA: Diagnosis not present

## 2014-11-11 LAB — CBC WITH DIFFERENTIAL/PLATELET
HCT: 37.5 % — ABNORMAL LOW (ref 39.0–52.0)
Hemoglobin: 12.9 g/dL — ABNORMAL LOW (ref 13.0–17.0)
Lymphocytes Relative: 37 % (ref 12–46)
Lymphs Abs: 2.4 10*3/uL (ref 0.7–4.0)
MCH: 34.1 pg — ABNORMAL HIGH (ref 26.0–34.0)
MCHC: 34.5 g/dL (ref 30.0–36.0)
MCV: 99.1 fL (ref 78.0–100.0)
Monocytes Absolute: 0.5 10*3/uL (ref 0.1–1.0)
Monocytes Relative: 8 % (ref 3–12)
Neutro Abs: 3.5 10*3/uL (ref 1.7–7.7)
Neutrophils Relative %: 55 % (ref 43–77)
Platelets: 206 10*3/uL (ref 150–400)
RBC: 3.78 MIL/uL — ABNORMAL LOW (ref 4.22–5.81)
RDW: 13 % (ref 11.5–15.5)
WBC: 6.4 10*3/uL (ref 4.0–10.5)

## 2014-11-11 MED ORDER — DOXYCYCLINE HYCLATE 100 MG PO TABS: 100.0000 mg | ORAL_TABLET | Freq: Two times a day (BID) | ORAL | Status: DC

## 2014-11-11 NOTE — Patient Instructions (Signed)
Take Doxycycline 100 mg twice daily x 10 days

## 2014-11-11 NOTE — Progress Notes (Signed)
   Subjective:    Patient ID: [name]., male    DOB: 21-Jun-1946, 69 y.o.   MRN: [id number]  HPI    Review of Systems     Objective:   Physical Exam        Assessment & Plan:

## 2014-11-11 NOTE — Progress Notes (Signed)
   Subjective:    Patient ID: [name]., male    DOB: 1946-06-09, 69 y.o.   MRN: [id number]  HPI Pt has hx MRSA and Herpetic stomatitis. Was here in Sept with LOM and Herpetic stomatitis treated with Biaxin and Valtrex. Last night he took one Valtrex thinking he had had an outbreak of herpes again but lesions he describes on forehead are not vesicular. He has some erythema that crosses the center line of his forehead and is slightly papular. He says it feels irritated. He thinks the Valtrex helped it but he really took 1 dose last night. Says symptoms been coming on for a couple of days. Has felt discomfort bilateral occipital area as well. Thought that right supraorbital area was swollen last night and erythematous. No recent URI symptoms. Recently got apparatus to help with sleep apnea. Says he sleeping better.    Review of Systems     Objective:   Physical Exam  No stomatitis noted. Has some erythema on forehead but no vesicular lesions. Right TM obscured by cerumen. Left TM clear neck supple. Occipital scalp area has no lesions. Has nevus flammeus(congenital birthmark) on posterior neck.      Assessment & Plan:  I think it's more likely he has a dermatitis such as MRSA on forehead or perhaps rosacea  CBC with differential is pending. Start doxycycline 100 mg twice daily for 10 days.

## 2014-11-14 ENCOUNTER — Other Ambulatory Visit: Payer: Self-pay | Admitting: Neurology

## 2014-11-16 NOTE — Telephone Encounter (Signed)
Tried to call patient several times but unable to contact patient .

## 2014-12-20 ENCOUNTER — Telehealth: Payer: Self-pay | Admitting: Internal Medicine

## 2014-12-20 ENCOUNTER — Telehealth: Payer: Self-pay | Admitting: Neurology

## 2014-12-20 NOTE — Telephone Encounter (Signed)
Left message on patient voicemail with instructions.

## 2014-12-20 NOTE — Telephone Encounter (Signed)
Patient is calling in regard to refill for Rx clopidogrel 75 mg sent to [facility].  He would like a 90 supply if possible.  Please call.  Thanks!

## 2014-12-20 NOTE — Telephone Encounter (Signed)
Neurologist needs to follow him. Needs appt with them.  Also no CPE here since 2015 so needs to be scheduled. See note per Neurologist. We are being placed in the middle.

## 2014-12-20 NOTE — Telephone Encounter (Signed)
Patient is seen by [facility] (Dr Krista Blue) and they have been filling his Plavix.  He has not been seen there in the past year because he has had no symptoms.  He called them to get a refill on his Plavix and they indicated that he would need to make an appointment.  He told them he had not had any symptoms.  They then suggested that they contact his PCP to ask you to fill his Plavix.  He wants to know if you would consider filling this for him.  States that he has not had any problems.  He just needs a refill on his medication.  And, [facility] doesn't want to fill it for him and he doesn't see going to Dr. Krista Blue just to get a refill on Plavix and nothing is wrong with him.  He was seen here for a sick visit on 11/11/14.  He hasn't had a CPE since 2015.  Patient can be reached at [phone].  Pharmacy:  [facility] (only has 4-5 pills left)

## 2014-12-20 NOTE — Telephone Encounter (Signed)
Patient was last seen in March 2015.  I called back.  Patient says he will check with PCP to see if they will refill med, if not he will call us back to schedule annual appt.

## 2014-12-22 ENCOUNTER — Encounter: Payer: Self-pay | Admitting: Neurology

## 2014-12-22 ENCOUNTER — Ambulatory Visit (INDEPENDENT_AMBULATORY_CARE_PROVIDER_SITE_OTHER): Payer: Medicare Other | Admitting: Neurology

## 2014-12-22 VITALS — BP 117/74 | HR 51 | Ht 71.0 in | Wt 229.0 lb

## 2014-12-22 DIAGNOSIS — F329 Major depressive disorder, single episode, unspecified: Secondary | ICD-10-CM

## 2014-12-22 DIAGNOSIS — H539 Unspecified visual disturbance: Secondary | ICD-10-CM | POA: Diagnosis not present

## 2014-12-22 DIAGNOSIS — F32A Depression, unspecified: Secondary | ICD-10-CM

## 2014-12-22 MED ORDER — CLOPIDOGREL BISULFATE 75 MG PO TABS: 75.0000 mg | ORAL_TABLET | Freq: Every day | ORAL | Status: DC
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
## 2015-02-06 ENCOUNTER — Ambulatory Visit (INDEPENDENT_AMBULATORY_CARE_PROVIDER_SITE_OTHER): Payer: Medicare Other | Admitting: Internal Medicine

## 2015-02-06 ENCOUNTER — Encounter: Payer: Self-pay | Admitting: Internal Medicine

## 2015-02-06 VITALS — BP 114/78 | HR 64 | Temp 97.9°F | Ht 69.0 in | Wt 225.0 lb

## 2015-02-06 DIAGNOSIS — M1712 Unilateral primary osteoarthritis, left knee: Secondary | ICD-10-CM

## 2015-02-06 DIAGNOSIS — M1612 Unilateral primary osteoarthritis, left hip: Secondary | ICD-10-CM

## 2015-02-06 DIAGNOSIS — M19042 Primary osteoarthritis, left hand: Secondary | ICD-10-CM | POA: Diagnosis not present

## 2015-02-06 DIAGNOSIS — M1812 Unilateral primary osteoarthritis of first carpometacarpal joint, left hand: Secondary | ICD-10-CM

## 2015-02-06 MED ORDER — MELOXICAM 15 MG PO TABS: 15.0000 mg | ORAL_TABLET | Freq: Every day | ORAL | Status: DC

## 2015-02-17 ENCOUNTER — Telehealth: Payer: Self-pay | Admitting: Internal Medicine

## 2015-02-17 NOTE — Telephone Encounter (Signed)
Patient requesting results of most recent PSA because friend is been diagnosed with prostate cancer. Patient lasted physical exam February 2015 and PSA at that time was normal. His wife was here today conveying that information to me. She was given a copy of his test result to take back to him and was told that he had not had a physical exam in well over a year and should book one in the near future.

## 2015-02-18 NOTE — Progress Notes (Addendum)
   Subjective:    Patient ID: Wesley Guadalajara., male    DOB: 01-28-1946, 69 y.o.   MRN: [id number]  HPI  Patient in today complaining of issues with knee and hip pain. Has some stiffness and pain without recent injury. Issues with left knee, left ear and left thumb.    Review of Systems     Objective:   Physical Exam  Good range of motion but has some limitation due to pain. No significant swelling of the knees. No significant ligament tenderness. Joints are stable. Some pain with internal and external rotation of left ear      Assessment & Plan:  Osteoarthritis left hip and left  knee  Osteoarthritis left thumb  Plan: Trial of meloxicam 15 mg daily. If symptoms persist, see orthopedist.

## 2015-02-18 NOTE — Patient Instructions (Signed)
Trial of meloxicam 15 mg daily. If symptoms persist, see orthopedist.

## 2015-03-10 ENCOUNTER — Ambulatory Visit (INDEPENDENT_AMBULATORY_CARE_PROVIDER_SITE_OTHER): Payer: Medicare Other | Admitting: Internal Medicine

## 2015-03-10 ENCOUNTER — Ambulatory Visit
Admission: RE | Admit: 2015-03-10 | Discharge: 2015-03-10 | Disposition: A | Discharge status: Home / Self Care | Payer: Medicare Other | Source: Ambulatory Visit | Attending: Internal Medicine | Admitting: Internal Medicine

## 2015-03-10 ENCOUNTER — Encounter: Payer: Self-pay | Admitting: Internal Medicine

## 2015-03-10 VITALS — BP 110/74 | HR 58 | Temp 98.2°F | Wt 226.0 lb

## 2015-03-10 DIAGNOSIS — M25551 Pain in right hip: Secondary | ICD-10-CM

## 2015-03-10 DIAGNOSIS — M160 Bilateral primary osteoarthritis of hip: Secondary | ICD-10-CM | POA: Diagnosis not present

## 2015-03-10 DIAGNOSIS — M1712 Unilateral primary osteoarthritis, left knee: Secondary | ICD-10-CM | POA: Diagnosis not present

## 2015-03-10 DIAGNOSIS — R5383 Other fatigue: Secondary | ICD-10-CM

## 2015-03-10 DIAGNOSIS — M25552 Pain in left hip: Secondary | ICD-10-CM | POA: Diagnosis not present

## 2015-03-10 DIAGNOSIS — M2550 Pain in unspecified joint: Secondary | ICD-10-CM | POA: Diagnosis not present

## 2015-03-10 DIAGNOSIS — R351 Nocturia: Secondary | ICD-10-CM | POA: Diagnosis not present

## 2015-03-10 DIAGNOSIS — M25562 Pain in left knee: Secondary | ICD-10-CM

## 2015-03-10 LAB — RHEUMATOID FACTOR: Rhuematoid fact SerPl-aCnc: <10 IU/mL (ref <=14)

## 2015-03-10 MED ORDER — TRAMADOL HCL 50 MG PO TABS: 50.0000 mg | ORAL_TABLET | Freq: Two times a day (BID) | ORAL | Status: DC | PRN

## 2015-03-10 MED ORDER — TAMSULOSIN HCL 0.4 MG PO CAPS: 0.4000 mg | ORAL_CAPSULE | Freq: Every day | ORAL | Status: DC

## 2015-03-10 NOTE — Patient Instructions (Signed)
Try tramadol at bedtime. Try Flomax daily. Rheumatology studies pending. Have x-ray of left hip and left knee.

## 2015-03-10 NOTE — Progress Notes (Signed)
   Subjective:    Patient ID: Wesley Guadalajara., male    DOB: February 14, 1946, 69 y.o.   MRN: [id number]  HPI He is now retired. Says he's not been sleeping well at night. Complaining of bilateral hip and bilateral knee pain. He tried Mobic 15 mg daily for 30 days and hasn't seen any improvement in musculoskeletal pain. Now says he has bilateral shoulder pain. He is concerned because he says his mother had arthritis-perhaps rheumatoid arthritis but he is not exactly sure. He also has nocturia up to 4 times nightly. Denies being significantly depressed although he has a history of depression. Complaining of fatigue.    Review of Systems     Objective:   Physical Exam  No significant pain with internal and external rotation of both hips. Some mild tenderness right lateral collateral ligament. No joint line tenderness bilaterally. Has crepitus in both knees. No effusions and knees. Chest clear. Cardiac exam regular rate and rhythm. No thyromegaly. Extremities without edema. Skin warm and dry.      Assessment & Plan:  Nocturia-likely due to BPH  Fatigue  Joint pain  Plan: He'll have x-ray of left hip and left knee which he says are the worst in terms of pain. For BPH she will try Flomax 0.4 mg daily. He'll have rheumatology studies done. Will also check thyroid functions. Have given him prescription for tramadol 50 mg at bedtime.

## 2015-03-11 LAB — SEDIMENTATION RATE: Sed Rate: 5 mm/hr (ref 0–20)

## 2015-03-11 LAB — TSH: TSH: 1.591 u[IU]/mL (ref 0.350–4.500)

## 2015-03-11 LAB — T4, FREE: Free T4: 0.83 ng/dL (ref 0.80–1.80)

## 2015-03-13 LAB — ANA: Anti Nuclear Antibody(ANA): NEGATIVE

## 2015-03-14 ENCOUNTER — Telehealth: Payer: Self-pay | Admitting: *Deleted

## 2015-03-14 LAB — CYCLIC CITRUL PEPTIDE ANTIBODY, IGG: Cyclic Citrullin Peptide Ab: <2 U/mL (ref 0.0–5.0)

## 2015-03-14 NOTE — Telephone Encounter (Signed)
Left message for patient to call back to review lab results.

## 2015-03-14 NOTE — Telephone Encounter (Signed)
Reviewed lab and xray results with patient

## 2015-05-04 ENCOUNTER — Other Ambulatory Visit: Payer: Self-pay | Admitting: Internal Medicine

## 2015-05-12 ENCOUNTER — Other Ambulatory Visit: Payer: Self-pay | Admitting: Internal Medicine

## 2015-05-15 ENCOUNTER — Telehealth: Payer: Self-pay

## 2015-05-15 MED ORDER — TRAMADOL HCL 50 MG PO TABS: 50.0000 mg | ORAL_TABLET | Freq: Two times a day (BID) | ORAL | Status: DC | PRN

## 2015-05-15 NOTE — Telephone Encounter (Signed)
Phoned to pharmacy 

## 2015-06-20 ENCOUNTER — Encounter: Payer: Self-pay | Admitting: Internal Medicine

## 2015-06-20 ENCOUNTER — Telehealth: Payer: Self-pay | Admitting: Internal Medicine

## 2015-06-20 ENCOUNTER — Ambulatory Visit (INDEPENDENT_AMBULATORY_CARE_PROVIDER_SITE_OTHER): Payer: Medicare Other | Admitting: Internal Medicine

## 2015-06-20 VITALS — BP 128/84

## 2015-06-20 DIAGNOSIS — Z23 Encounter for immunization: Secondary | ICD-10-CM | POA: Diagnosis not present

## 2015-06-20 NOTE — Telephone Encounter (Signed)
Patient was in today for his flu shot.  States that a few weeks ago he had a light-headed spell.  He started checking his BP and has been recording his BP reading's since 10/17.  Patient stopped taking his Toprol-XL 25mg  24 hour tablet due to this episode and his low BP.    Dr. Renold Genta has reviewed patient's BP log and is in agreement with patient NOT being on his Toprol-XL at the present time.  See scanned document of patient's BP log for 05/08/15 - 06/19/15.

## 2015-06-22 ENCOUNTER — Other Ambulatory Visit: Payer: Self-pay | Admitting: Internal Medicine

## 2015-06-30 ENCOUNTER — Ambulatory Visit (INDEPENDENT_AMBULATORY_CARE_PROVIDER_SITE_OTHER): Payer: Medicare Other | Admitting: Ophthalmology

## 2015-06-30 DIAGNOSIS — H43813 Vitreous degeneration, bilateral: Secondary | ICD-10-CM | POA: Diagnosis not present

## 2015-06-30 DIAGNOSIS — I10 Essential (primary) hypertension: Secondary | ICD-10-CM

## 2015-06-30 DIAGNOSIS — H35033 Hypertensive retinopathy, bilateral: Secondary | ICD-10-CM | POA: Diagnosis not present

## 2015-06-30 DIAGNOSIS — H33302 Unspecified retinal break, left eye: Secondary | ICD-10-CM | POA: Diagnosis not present

## 2015-07-08 ENCOUNTER — Other Ambulatory Visit: Payer: Self-pay | Admitting: Internal Medicine

## 2015-07-17 ENCOUNTER — Other Ambulatory Visit: Payer: Self-pay | Admitting: Internal Medicine

## 2015-07-17 NOTE — Telephone Encounter (Signed)
Refill once only if patient needs. Please call him

## 2015-07-19 NOTE — Telephone Encounter (Signed)
Refilled this per patient request

## 2015-07-19 NOTE — Telephone Encounter (Signed)
Left message for return call.

## 2015-07-22 ENCOUNTER — Other Ambulatory Visit: Payer: Self-pay | Admitting: Internal Medicine

## 2015-07-22 NOTE — Telephone Encounter (Signed)
Our records indicate this was refilled December 28 for #60 tablets. I phoned pharmacy and indicated this to them.

## 2015-08-11 ENCOUNTER — Encounter: Payer: Self-pay | Admitting: Internal Medicine

## 2015-08-11 ENCOUNTER — Telehealth: Payer: Self-pay | Admitting: Internal Medicine

## 2015-08-11 ENCOUNTER — Ambulatory Visit (INDEPENDENT_AMBULATORY_CARE_PROVIDER_SITE_OTHER): Payer: Medicare Other | Admitting: Internal Medicine

## 2015-08-11 VITALS — BP 112/70 | HR 70 | Temp 97.8°F | Resp 18 | Ht 69.0 in | Wt 222.0 lb

## 2015-08-11 DIAGNOSIS — Z5321 Procedure and treatment not carried out due to patient leaving prior to being seen by health care provider: Secondary | ICD-10-CM

## 2015-08-11 NOTE — Telephone Encounter (Signed)
Patient was scheduled for office visit this a.m. @ 9:45.  He arrived for visit @ 9:30.  Patient was checked in and waited to see the doctor until 10:20.  Patient came out twice to see if he would be seen any time soon; stated that he had somewhere he had to be.  At 10:20, patient advised that he couldn't wait any longer.  He would have to call back and be seen next week.  He needed to be elsewhere.    Made Dr. Renold Genta aware of the situation.

## 2015-08-21 NOTE — Progress Notes (Signed)
   Subjective:    Patient ID: [name]., male    DOB: 01-14-1946, 70 y.o.   MRN: [id number]  HPI Patient came in to seek advice regarding fever blisters but left without being seen after waiting about 20- 30  minutes    Review of Systems     Objective:   Physical Exam        Assessment & Plan:

## 2015-08-21 NOTE — Patient Instructions (Signed)
Left without being seen.

## 2015-09-25 ENCOUNTER — Telehealth: Payer: Self-pay | Admitting: Internal Medicine

## 2015-09-25 NOTE — Telephone Encounter (Signed)
Refill once 

## 2015-09-25 NOTE — Telephone Encounter (Signed)
Would like to get a refill on his Tramadol.  We have it listed as Take 1 every 12 hours as needed.  He states his bottle says take one three times a day but he just takes one at night when his arthritis wakes him up.    Pharmacy:  [facility] [address].

## 2015-09-27 ENCOUNTER — Other Ambulatory Visit: Payer: Self-pay | Admitting: Internal Medicine

## 2015-09-27 NOTE — Telephone Encounter (Signed)
Phoned to pharmacy 

## 2015-09-27 NOTE — Telephone Encounter (Signed)
Note from March 6th says to refill once.

## 2015-11-08 ENCOUNTER — Other Ambulatory Visit: Payer: Self-pay | Admitting: Neurology

## 2015-11-10 DIAGNOSIS — H35372 Puckering of macula, left eye: Secondary | ICD-10-CM | POA: Diagnosis not present

## 2015-11-10 DIAGNOSIS — Z961 Presence of intraocular lens: Secondary | ICD-10-CM | POA: Diagnosis not present

## 2015-11-10 DIAGNOSIS — H35033 Hypertensive retinopathy, bilateral: Secondary | ICD-10-CM | POA: Diagnosis not present

## 2015-11-21 ENCOUNTER — Other Ambulatory Visit: Payer: Medicare Other | Admitting: Internal Medicine

## 2015-11-21 ENCOUNTER — Other Ambulatory Visit: Payer: Self-pay | Admitting: Internal Medicine

## 2015-11-21 DIAGNOSIS — D649 Anemia, unspecified: Secondary | ICD-10-CM | POA: Diagnosis not present

## 2015-11-21 DIAGNOSIS — Z0000 Encounter for general adult medical examination without abnormal findings: Secondary | ICD-10-CM

## 2015-11-21 DIAGNOSIS — Z13220 Encounter for screening for lipoid disorders: Secondary | ICD-10-CM

## 2015-11-21 DIAGNOSIS — I10 Essential (primary) hypertension: Secondary | ICD-10-CM

## 2015-11-21 DIAGNOSIS — Z125 Encounter for screening for malignant neoplasm of prostate: Secondary | ICD-10-CM | POA: Diagnosis not present

## 2015-11-21 DIAGNOSIS — Z130 Encounter for screening for diseases of the blood and blood-forming organs and certain disorders involving the immune mechanism: Secondary | ICD-10-CM

## 2015-11-21 LAB — CBC WITH DIFFERENTIAL/PLATELET
Basophils Absolute: 38 cells/uL (ref 0–200)
Basophils Relative: 1 %
Eosinophils Absolute: 114 cells/uL (ref 15–500)
Eosinophils Relative: 3 %
HCT: 35.9 % — ABNORMAL LOW (ref 38.5–50.0)
Hemoglobin: 11.8 g/dL — ABNORMAL LOW (ref 13.2–17.1)
Lymphocytes Relative: 43 %
Lymphs Abs: 1634 cells/uL (ref 850–3900)
MCH: 33.1 pg — ABNORMAL HIGH (ref 27.0–33.0)
MCHC: 32.9 g/dL (ref 32.0–36.0)
MCV: 100.8 fL — ABNORMAL HIGH (ref 80.0–100.0)
MPV: 9.9 fL (ref 7.5–12.5)
Monocytes Absolute: 380 cells/uL (ref 200–950)
Monocytes Relative: 10 %
Neutro Abs: 1634 cells/uL (ref 1500–7800)
Neutrophils Relative %: 43 %
Platelets: 215 10*3/uL (ref 140–400)
RBC: 3.56 MIL/uL — ABNORMAL LOW (ref 4.20–5.80)
RDW: 14.1 % (ref 11.0–15.0)
WBC: 3.8 10*3/uL (ref 3.8–10.8)

## 2015-11-21 LAB — COMPLETE METABOLIC PANEL WITH GFR
ALT: 12 U/L (ref 9–46)
AST: 23 U/L (ref 10–35)
Albumin: 3.8 g/dL (ref 3.6–5.1)
Alkaline Phosphatase: 55 U/L (ref 40–115)
BUN: 15 mg/dL (ref 7–25)
CO2: 28 mmol/L (ref 20–31)
Calcium: 9 mg/dL (ref 8.6–10.3)
Chloride: 104 mmol/L (ref 98–110)
Creat: 1.3 mg/dL — ABNORMAL HIGH (ref 0.70–1.25)
GFR, Est African American: 64 mL/min (ref >=60)
GFR, Est Non African American: 56 mL/min — ABNORMAL LOW (ref >=60)
Glucose, Bld: 96 mg/dL (ref 65–99)
Potassium: 4.3 mmol/L (ref 3.5–5.3)
Sodium: 134 mmol/L — ABNORMAL LOW (ref 135–146)
Total Bilirubin: 0.5 mg/dL (ref 0.2–1.2)
Total Protein: 7.7 g/dL (ref 6.1–8.1)

## 2015-11-21 LAB — LIPID PANEL
Cholesterol: 162 mg/dL (ref 125–200)
HDL: 46 mg/dL (ref >=40)
LDL Cholesterol: 100 mg/dL (ref <130)
Total CHOL/HDL Ratio: 3.5 Ratio (ref <=5.0)
Triglycerides: 78 mg/dL (ref <150)
VLDL: 16 mg/dL (ref <30)

## 2015-11-22 LAB — PSA: PSA: 0.61 ng/mL (ref <=4.00)

## 2015-11-23 ENCOUNTER — Ambulatory Visit (INDEPENDENT_AMBULATORY_CARE_PROVIDER_SITE_OTHER): Payer: Medicare Other | Admitting: Internal Medicine

## 2015-11-23 ENCOUNTER — Encounter: Payer: Self-pay | Admitting: Internal Medicine

## 2015-11-23 ENCOUNTER — Telehealth: Payer: Self-pay

## 2015-11-23 VITALS — BP 112/70 | HR 72 | Temp 97.3°F | Resp 18 | Ht 69.0 in | Wt 225.5 lb

## 2015-11-23 DIAGNOSIS — D649 Anemia, unspecified: Secondary | ICD-10-CM | POA: Diagnosis not present

## 2015-11-23 DIAGNOSIS — R5383 Other fatigue: Secondary | ICD-10-CM

## 2015-11-23 DIAGNOSIS — Z0000 Encounter for general adult medical examination without abnormal findings: Secondary | ICD-10-CM | POA: Diagnosis not present

## 2015-11-23 DIAGNOSIS — N400 Enlarged prostate without lower urinary tract symptoms: Secondary | ICD-10-CM | POA: Diagnosis not present

## 2015-11-23 DIAGNOSIS — I10 Essential (primary) hypertension: Secondary | ICD-10-CM

## 2015-11-23 DIAGNOSIS — Z23 Encounter for immunization: Secondary | ICD-10-CM | POA: Diagnosis not present

## 2015-11-23 LAB — POCT URINALYSIS DIPSTICK
Bilirubin, UA: NEGATIVE
Blood, UA: NEGATIVE
Glucose, UA: NEGATIVE
Ketones, UA: NEGATIVE
Leukocytes, UA: NEGATIVE
Nitrite, UA: NEGATIVE
Protein, UA: NEGATIVE
Spec Grav, UA: 1.02
Urobilinogen, UA: 0.2
pH, UA: 6.5

## 2015-11-23 LAB — B12 AND FOLATE PANEL
Folate: >24 ng/mL (ref >5.4)
Vitamin B-12: 582 pg/mL (ref 200–1100)

## 2015-11-23 LAB — IRON AND TIBC
%SAT: 39 % (ref 15–60)
Iron: 125 ug/dL (ref 50–180)
TIBC: 323 ug/dL (ref 250–425)
UIBC: 198 ug/dL (ref 125–400)

## 2015-11-23 MED ORDER — BUPROPION HCL ER (XL) 150 MG PO TB24: 150.0000 mg | ORAL_TABLET | Freq: Every day | ORAL | Status: DC

## 2015-11-23 NOTE — Telephone Encounter (Signed)
Patient states that he went home and spoke to wife and they decided that he would like to go back on wellbutrin. Per. Dr. Renold Genta start 150mg  XL daily. He will need to follow up in office.

## 2015-11-23 NOTE — Patient Instructions (Addendum)
It was a pleasure to see you today.  Start Wellbutrin XL 150 mg daily and follow-up in 6-8 weeks for fatigue and anemia.
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
## 2015-11-27 DIAGNOSIS — Z029 Encounter for administrative examinations, unspecified: Secondary | ICD-10-CM

## 2015-12-22 ENCOUNTER — Other Ambulatory Visit: Payer: Self-pay | Admitting: Internal Medicine

## 2015-12-22 NOTE — Telephone Encounter (Signed)
Phoned to pharmacy 

## 2015-12-22 NOTE — Telephone Encounter (Signed)
Refill x 90 days 

## 2016-01-17 ENCOUNTER — Other Ambulatory Visit: Payer: Self-pay

## 2016-01-17 MED ORDER — BUPROPION HCL ER (XL) 150 MG PO TB24: 150.0000 mg | ORAL_TABLET | Freq: Every day | ORAL | Status: DC

## 2016-01-17 NOTE — Telephone Encounter (Signed)
Patient requesting refill. 

## 2016-01-24 DIAGNOSIS — L821 Other seborrheic keratosis: Secondary | ICD-10-CM | POA: Diagnosis not present

## 2016-01-24 DIAGNOSIS — L738 Other specified follicular disorders: Secondary | ICD-10-CM | POA: Diagnosis not present

## 2016-01-24 DIAGNOSIS — L720 Epidermal cyst: Secondary | ICD-10-CM | POA: Diagnosis not present

## 2016-01-24 DIAGNOSIS — Z419 Encounter for procedure for purposes other than remedying health state, unspecified: Secondary | ICD-10-CM | POA: Diagnosis not present

## 2016-01-24 DIAGNOSIS — L570 Actinic keratosis: Secondary | ICD-10-CM | POA: Diagnosis not present

## 2016-02-01 ENCOUNTER — Other Ambulatory Visit: Payer: Self-pay | Admitting: Neurology

## 2016-02-06 ENCOUNTER — Ambulatory Visit (INDEPENDENT_AMBULATORY_CARE_PROVIDER_SITE_OTHER): Payer: Medicare Other | Admitting: Internal Medicine

## 2016-02-06 ENCOUNTER — Encounter: Payer: Self-pay | Admitting: Internal Medicine

## 2016-02-06 VITALS — Ht 69.0 in | Wt 216.0 lb

## 2016-02-06 DIAGNOSIS — F32A Depression, unspecified: Secondary | ICD-10-CM

## 2016-02-06 DIAGNOSIS — I951 Orthostatic hypotension: Secondary | ICD-10-CM | POA: Diagnosis not present

## 2016-02-06 DIAGNOSIS — F329 Major depressive disorder, single episode, unspecified: Secondary | ICD-10-CM

## 2016-02-06 MED ORDER — BUPROPION HCL ER (SR) 100 MG PO TB12: 100.0000 mg | ORAL_TABLET | Freq: Every day | ORAL | Status: DC

## 2016-02-06 NOTE — Patient Instructions (Signed)
Reduce Wellbutrin from 150 mg XL to 100 mg daily. Call with progress report in 2-3 weeks. If symptoms persist, we will refer you to cardiologist.

## 2016-02-06 NOTE — Progress Notes (Signed)
   Subjective:    Patient ID: [name]., male    DOB: Nov 25, 1945, 70 y.o.   MRN: [id number]  HPI 70 year old male who was started on Wellbutrin XL 150 mg daily for depression. Since then he's noticed some low blood pressure. Says blood pressures running between 90 and [id number] systolically and 0000000 diastolically. Sometimes when he stands up he feels dizzy briefly for about 10 seconds but has had no syncope. He has no chest pain. He had a normal nuclear medicine study in 2004 in 2014. He saw Dr. Wende Crease 2014. He's concerned about his heart. Wife is concerned about him and wrote a note saying that he had some fatigue and generalized weakness. She thought he had some extraparametal mouth movements but I do not see that today. I called that to his attention and he says he doesn't have that. She feels that his personality has returned on the Wellbutrin. She wants him to continue the antidepressant but 3% of people can experience hypotension with Wellbutrin and dizziness is also reported with Wellbutrin. That is the only thing we have changed recently since his physical exam in May.    Review of Systems see above     Objective:   Physical Exam  He has no tremor. No generalized weakness. Muscle strength is normal in the upper and lower extremities. He has a bit of a flat affect. He is anxious and asked many questions. Neck is supple without JVD thyromegaly or carotid bruits. Chest clear to auscultation. Cardiac exam regular rate and rhythm normal S1 and S2. Extremities without edema. Blood pressure lying was 116/72 with pulse of 72, sitting blood pressure was 102/70 with pulse of 69. Standing blood pressure was 96/70 with pulse of 71. He appears to have some mild orthostatic hypotension. He says he's been well-hydrated recently with a hot weather. Pulse oximetry is 96%. He complains of some shortness of breath after doing some yard work recently. But no chest pain.      Assessment & Plan:    Orthostatic hypotension-possibly related to Wellbutrin  Mild shortness of breath with physical activity. Had normal nuclear medicine study in 2014. No complaint of chest pain.  Plan: Will reduce Wellbutrin to 100 mg SR daily and see if symptoms improve. If not, he may need cardiology evaluation for orthostasis.

## 2016-02-28 ENCOUNTER — Telehealth: Payer: Self-pay | Admitting: Internal Medicine

## 2016-02-28 ENCOUNTER — Encounter: Payer: Self-pay | Admitting: Internal Medicine

## 2016-02-28 MED ORDER — BUPROPION HCL ER (SR) 100 MG PO TB12: 100.0000 mg | ORAL_TABLET | Freq: Two times a day (BID) | ORAL | 0 refills | Status: DC

## 2016-02-28 NOTE — Telephone Encounter (Signed)
Patient was changed to lower dose of Wellbutrin at last visit. Says blood pressure has improved since decreasing dose of Wellbutrin from 150 XL daily to 100 mg every 12 hours. He was complaining of low blood pressure malaise and unexplained fatigue with negative workup. Says he's feeling about the same although he's placed his blood pressure has improved and he's no longer feeling lightheaded. He wants prescription sent to [facility] asked for 100 mg dose he will take 100 mg SR daily not every 12 hours

## 2016-03-11 ENCOUNTER — Telehealth: Payer: Self-pay | Admitting: Internal Medicine

## 2016-03-11 MED ORDER — TAMSULOSIN HCL 0.4 MG PO CAPS: ORAL_CAPSULE | ORAL | 11 refills | Status: DC

## 2016-03-11 NOTE — Telephone Encounter (Signed)
Pt would like a  90 day refill on tamsulosin (FLOMAX) 0.4 MG CAPS capsule [id number]  and would like that to go to [facility].  This is a change of pharmacy for him.  Best number to cll pt is [phone]

## 2016-03-11 NOTE — Telephone Encounter (Signed)
Refill x one year to [facility]

## 2016-03-18 ENCOUNTER — Other Ambulatory Visit: Payer: Self-pay | Admitting: *Deleted

## 2016-03-18 ENCOUNTER — Telehealth: Payer: Self-pay | Admitting: Neurology

## 2016-03-18 MED ORDER — CLOPIDOGREL BISULFATE 75 MG PO TABS: ORAL_TABLET | ORAL | 0 refills | Status: DC

## 2016-03-18 NOTE — Telephone Encounter (Signed)
Patient called to request refill of clopidogrel (PLAVIX) 75 MG tablet to [facility]

## 2016-03-18 NOTE — Telephone Encounter (Signed)
Spoke to patient - scheduled follow up and sent 90-day refill to [facility].

## 2016-04-17 ENCOUNTER — Other Ambulatory Visit: Payer: Self-pay | Admitting: Internal Medicine

## 2016-05-06 ENCOUNTER — Other Ambulatory Visit: Payer: Self-pay | Admitting: Neurology

## 2016-05-08 ENCOUNTER — Ambulatory Visit (INDEPENDENT_AMBULATORY_CARE_PROVIDER_SITE_OTHER): Payer: Medicare Other | Admitting: Neurology

## 2016-05-08 ENCOUNTER — Encounter: Payer: Self-pay | Admitting: Neurology

## 2016-05-08 VITALS — BP 118/75 | HR 64 | Ht 69.0 in | Wt 214.0 lb

## 2016-05-08 DIAGNOSIS — R42 Dizziness and giddiness: Secondary | ICD-10-CM | POA: Diagnosis not present

## 2016-05-08 DIAGNOSIS — G4733 Obstructive sleep apnea (adult) (pediatric): Secondary | ICD-10-CM | POA: Diagnosis not present

## 2016-05-08 DIAGNOSIS — I10 Essential (primary) hypertension: Secondary | ICD-10-CM | POA: Diagnosis not present

## 2016-05-08 DIAGNOSIS — I679 Cerebrovascular disease, unspecified: Secondary | ICD-10-CM | POA: Diagnosis not present
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
## 2016-06-07 ENCOUNTER — Telehealth: Payer: Self-pay | Admitting: Internal Medicine

## 2016-06-07 MED ORDER — TRAMADOL HCL 50 MG PO TABS: 50.0000 mg | ORAL_TABLET | Freq: Three times a day (TID) | ORAL | 0 refills | Status: DC | PRN

## 2016-06-07 NOTE — Telephone Encounter (Signed)
Tramadol called [facility].

## 2016-06-07 NOTE — Telephone Encounter (Signed)
Wants to request a refill on Tramadol 50mg .    [facility]  [facility] [place]

## 2016-06-07 NOTE — Telephone Encounter (Signed)
Please refill Tramadol one tome only- will need to call in

## 2016-06-18 ENCOUNTER — Other Ambulatory Visit: Payer: Self-pay | Admitting: *Deleted

## 2016-06-18 ENCOUNTER — Other Ambulatory Visit: Payer: Self-pay | Admitting: Neurology

## 2016-07-01 ENCOUNTER — Encounter (INDEPENDENT_AMBULATORY_CARE_PROVIDER_SITE_OTHER): Payer: Self-pay

## 2016-07-01 ENCOUNTER — Ambulatory Visit (INDEPENDENT_AMBULATORY_CARE_PROVIDER_SITE_OTHER): Payer: Medicare Other | Admitting: Ophthalmology

## 2016-08-29 ENCOUNTER — Other Ambulatory Visit: Payer: Self-pay | Admitting: Internal Medicine

## 2016-08-29 NOTE — Telephone Encounter (Signed)
Call in #60 tabs with no refill

## 2016-09-16 ENCOUNTER — Ambulatory Visit (INDEPENDENT_AMBULATORY_CARE_PROVIDER_SITE_OTHER): Payer: Medicare Other | Admitting: Ophthalmology

## 2016-09-16 DIAGNOSIS — H35033 Hypertensive retinopathy, bilateral: Secondary | ICD-10-CM | POA: Diagnosis not present

## 2016-09-16 DIAGNOSIS — H43813 Vitreous degeneration, bilateral: Secondary | ICD-10-CM

## 2016-09-16 DIAGNOSIS — H33302 Unspecified retinal break, left eye: Secondary | ICD-10-CM | POA: Diagnosis not present

## 2016-09-16 DIAGNOSIS — I10 Essential (primary) hypertension: Secondary | ICD-10-CM | POA: Diagnosis not present

## 2016-10-15 ENCOUNTER — Encounter: Payer: Self-pay | Admitting: Internal Medicine

## 2016-10-15 ENCOUNTER — Ambulatory Visit (INDEPENDENT_AMBULATORY_CARE_PROVIDER_SITE_OTHER): Payer: Medicare Other | Admitting: Internal Medicine

## 2016-10-15 VITALS — BP 110/80 | HR 91 | Temp 98.0°F | Ht 69.0 in | Wt 218.0 lb

## 2016-10-15 DIAGNOSIS — M79642 Pain in left hand: Secondary | ICD-10-CM

## 2016-10-15 DIAGNOSIS — M25552 Pain in left hip: Secondary | ICD-10-CM | POA: Diagnosis not present

## 2016-10-15 DIAGNOSIS — M25562 Pain in left knee: Secondary | ICD-10-CM

## 2016-10-15 DIAGNOSIS — M79641 Pain in right hand: Secondary | ICD-10-CM | POA: Diagnosis not present

## 2016-10-15 MED ORDER — DICLOFENAC SODIUM 75 MG PO TBEC: 75.0000 mg | DELAYED_RELEASE_TABLET | Freq: Two times a day (BID) | ORAL | 2 refills | Status: DC

## 2016-10-15 NOTE — Patient Instructions (Signed)
To see rheumatologist for further evaluation of musculoskeletal issues. Trial of Voltaren 75 mg twice daily. Stop over-the-counter anti-inflammatory medication. Continue tramadol sparingly for pain.

## 2016-10-15 NOTE — Progress Notes (Signed)
   Subjective:    Patient ID: [name]., male    DOB: [birth year], 71 y.o.   MRN: [id number]  HPI 71 year old Male has been taking Tramadol for musculoskeletal pain on a when necessary basis for some time nail. Recently has noticed progression of pain in both hands. When he has to unlock a car door manually he notices it is painful to twist his hand. He has no red or hot joints. He has never been evaluated for rheumatoid arthritis. Wife who is a retired Equities trader wants him checked by rheumatologist. He's never had x-rays of his hands. He's been taking over-the-counter anti-inflammatory medication on a when necessary basis.  He has history of anxiety depression treated with Wellbutrin.  Also has left knee and left hip pain. Has never had x-rays of these joints either.  History of left knee x-ray August 2016 showing mild degenerative changes. X-ray of left hip same time showed mild degenerative changes.  Review of Systems see above     Objective:   Physical Exam He has no obvious deformity of his hands. No significant Heberden's or Bouchard's nodes. No red hot swollen joints.       Assessment & Plan:  Left hip pain  Left knee pain  Bilateral hand pain  Plan: Referral to rheumatology for further evaluation. Trial of proton RN 75 mg twice daily with food. Stop over-the-counter anti-inflammatory medication. Continue tramadol sparingly.

## 2016-10-17 ENCOUNTER — Other Ambulatory Visit: Payer: Self-pay | Admitting: Internal Medicine

## 2016-10-17 NOTE — Telephone Encounter (Signed)
Refill x 3 months 

## 2016-11-11 DIAGNOSIS — H35372 Puckering of macula, left eye: Secondary | ICD-10-CM | POA: Diagnosis not present

## 2016-11-11 DIAGNOSIS — H31002 Unspecified chorioretinal scars, left eye: Secondary | ICD-10-CM | POA: Diagnosis not present

## 2016-11-11 DIAGNOSIS — H35033 Hypertensive retinopathy, bilateral: Secondary | ICD-10-CM | POA: Diagnosis not present

## 2016-11-11 DIAGNOSIS — Z961 Presence of intraocular lens: Secondary | ICD-10-CM | POA: Diagnosis not present

## 2016-11-20 DIAGNOSIS — M1810 Unilateral primary osteoarthritis of first carpometacarpal joint, unspecified hand: Secondary | ICD-10-CM | POA: Diagnosis not present

## 2016-11-20 DIAGNOSIS — M5410 Radiculopathy, site unspecified: Secondary | ICD-10-CM | POA: Diagnosis not present

## 2016-11-20 DIAGNOSIS — M545 Low back pain: Secondary | ICD-10-CM | POA: Diagnosis not present

## 2016-11-25 ENCOUNTER — Encounter: Payer: Self-pay | Admitting: Internal Medicine

## 2016-11-25 ENCOUNTER — Ambulatory Visit (INDEPENDENT_AMBULATORY_CARE_PROVIDER_SITE_OTHER): Payer: Medicare Other | Admitting: Internal Medicine

## 2016-11-25 ENCOUNTER — Telehealth: Payer: Self-pay

## 2016-11-25 VITALS — BP 128/90 | HR 105 | Temp 99.5°F | Ht 69.0 in | Wt 218.0 lb

## 2016-11-25 DIAGNOSIS — K120 Recurrent oral aphthae: Secondary | ICD-10-CM | POA: Diagnosis not present

## 2016-11-25 DIAGNOSIS — L03211 Cellulitis of face: Secondary | ICD-10-CM | POA: Diagnosis not present

## 2016-11-25 DIAGNOSIS — K122 Cellulitis and abscess of mouth: Secondary | ICD-10-CM | POA: Diagnosis not present

## 2016-11-25 LAB — CBC WITH DIFFERENTIAL/PLATELET
Basophils Absolute: 0 cells/uL (ref 0–200)
Basophils Relative: 0 %
Eosinophils Absolute: 0 cells/uL — ABNORMAL LOW (ref 15–500)
Eosinophils Relative: 0 %
HCT: 36.2 % — ABNORMAL LOW (ref 38.5–50.0)
Hemoglobin: 12.1 g/dL — ABNORMAL LOW (ref 13.2–17.1)
Lymphocytes Relative: 26 %
Lymphs Abs: 2392 cells/uL (ref 850–3900)
MCH: 33.8 pg — ABNORMAL HIGH (ref 27.0–33.0)
MCHC: 33.4 g/dL (ref 32.0–36.0)
MCV: 101.1 fL — ABNORMAL HIGH (ref 80.0–100.0)
MPV: 9.5 fL (ref 7.5–12.5)
Monocytes Absolute: 828 cells/uL (ref 200–950)
Monocytes Relative: 9 %
Neutro Abs: 5980 cells/uL (ref 1500–7800)
Neutrophils Relative %: 65 %
Platelets: 208 10*3/uL (ref 140–400)
RBC: 3.58 MIL/uL — ABNORMAL LOW (ref 4.20–5.80)
RDW: 13.6 % (ref 11.0–15.0)
WBC: 9.2 10*3/uL (ref 3.8–10.8)

## 2016-11-25 MED ORDER — HYDROCODONE-ACETAMINOPHEN 5-325 MG PO TABS: 1.0000 | ORAL_TABLET | ORAL | 0 refills | Status: DC | PRN

## 2016-11-25 MED ORDER — CEFTRIAXONE SODIUM 1 G IJ SOLR
1.0000 g | Freq: Once | INTRAMUSCULAR | Status: AC
  Administered 2016-11-25: 1 g via INTRAMUSCULAR

## 2016-11-25 MED ORDER — DOXYCYCLINE HYCLATE 100 MG PO TABS: 100.0000 mg | ORAL_TABLET | Freq: Two times a day (BID) | ORAL | 0 refills | Status: DC

## 2016-11-25 MED ORDER — VALACYCLOVIR HCL 500 MG PO TABS: 500.0000 mg | ORAL_TABLET | Freq: Every day | ORAL | 11 refills | Status: AC

## 2016-11-25 NOTE — Telephone Encounter (Signed)
Patient's wife called states patient's jaw (front lower jaw and chin) is painful and swollen for two days now he's been rinsing with salt with no relief. She said it's not his teeth or throat, no chills and she hasn't checked his temperature. She would like to know if he can come in today for a visit?

## 2016-11-25 NOTE — Addendum Note (Signed)
Addended by: Inocencio Homes on: 11/25/2016 04:59 PM   Modules accepted: Orders

## 2016-11-25 NOTE — Telephone Encounter (Signed)
Appointment given for today @ 3:45.  Patient confirmed.

## 2016-11-25 NOTE — Patient Instructions (Addendum)
1 g IM Rocephin. Doxycycline 100 mg twice daily for 10 days. Warm hot compresses to chin area for 20 minutes twice a day. Return on May 10. CBC with differential pending. Norco sparingly for pain

## 2016-11-25 NOTE — Progress Notes (Addendum)
   Subjective:    Patient ID: [name]., male    DOB: 03/25/1946, 71 y.o.   MRN: [id number]  HPI  He awakened on Saturday, May 5 and had a discomfort on his inner lip. He appears to have an Aptos ulcer on his inner lip. He vomited twice that afternoon. No documented fever. Says he had some chills. Subsequently developed some swelling and redness of his chin area. He denies any injury to that area. He does shave. Does hot have beard or moustache.  Saw a rheumatologist recently and was diagnosed with scoliosis and is to see Dr. Nelva Bush in the near future for injections. Has tramadol for musculoskeletal pain and Voltaren orally 75 mg twice daily.    Review of Systems     Objective:   Physical Exam Area of erythema in a circular area about his chin. No pustules noted. Inside lower lip he has an apthous ulcer.       Assessment & Plan:  Cellulitis of his chin  Aphthous ulcer  Plan: 1 g IM Rocephin. CBC with differential pending. Doxycycline 100 mg twice daily for 10 days. Follow-up on Thursday, May 10. Norco 5/325 one by mouth 4-6 hours when necessary pain #20 no refill

## 2016-11-25 NOTE — Telephone Encounter (Signed)
Needs appointment

## 2016-11-26 DIAGNOSIS — M5136 Other intervertebral disc degeneration, lumbar region: Secondary | ICD-10-CM | POA: Diagnosis not present

## 2016-11-28 ENCOUNTER — Ambulatory Visit (INDEPENDENT_AMBULATORY_CARE_PROVIDER_SITE_OTHER): Payer: Medicare Other | Admitting: Internal Medicine

## 2016-11-28 ENCOUNTER — Encounter: Payer: Self-pay | Admitting: Internal Medicine

## 2016-11-28 VITALS — BP 110/80 | HR 91 | Temp 98.0°F | Wt 201.0 lb

## 2016-11-28 DIAGNOSIS — L03211 Cellulitis of face: Secondary | ICD-10-CM | POA: Diagnosis not present

## 2016-11-28 NOTE — Progress Notes (Signed)
   Subjective:    Patient ID: [name]., male    DOB: 1945/09/30, 71 y.o.   MRN: [id number]  HPI 71 year old male who presented to the office on May 7 with a cellulitis of his chin and one Apthous ulcer inside lower lip. He was placed on doxycycline and is improving.  Several years ago he had a problem with MRSA infection that was recurrent for some time.  He was  seen April 2016 with erythema on his forehead and I thought he had MRSA at that time treated with doxycycline for 10 days.  He usually just washes his face with Dove soap. I recommended that he use Dial soap with a wash cloth.    Review of Systems see above     Objective:   Physical Exam Less erythema of the chin. Chin area now less swollen. Says it's less tender.       Assessment & Plan:  Cellulitis of chin - likely MRSA given prior history  Plan: Finish course of doxycycline. Warm hot compresses for 20 minutes twice daily. Use Dial soap on face for 2-4 weeks.

## 2016-11-28 NOTE — Patient Instructions (Signed)
Finish course of doxycycline. Use warm hot compresses 20 minutes twice daily. Used also pulled face daily for 2-4 weeks.

## 2016-11-29 DIAGNOSIS — M5136 Other intervertebral disc degeneration, lumbar region: Secondary | ICD-10-CM | POA: Diagnosis not present

## 2016-12-06 ENCOUNTER — Encounter: Payer: Self-pay | Admitting: Internal Medicine

## 2016-12-06 ENCOUNTER — Telehealth: Payer: Self-pay | Admitting: Internal Medicine

## 2016-12-06 DIAGNOSIS — K122 Cellulitis and abscess of mouth: Secondary | ICD-10-CM

## 2016-12-06 MED ORDER — DOXYCYCLINE HYCLATE 100 MG PO TABS: 100.0000 mg | ORAL_TABLET | Freq: Two times a day (BID) | ORAL | 0 refills | Status: DC

## 2016-12-06 NOTE — Telephone Encounter (Signed)
Phone call at 8pm re recurrence of cellulitis of chin Reorder 10 day course of Doxycycline

## 2016-12-09 ENCOUNTER — Telehealth: Payer: Self-pay | Admitting: Internal Medicine
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
## 2017-01-23 ENCOUNTER — Other Ambulatory Visit: Payer: Self-pay | Admitting: Internal Medicine

## 2017-02-19 ENCOUNTER — Telehealth: Payer: Self-pay | Admitting: Internal Medicine

## 2017-02-19 MED ORDER — TRAMADOL HCL 50 MG PO TABS: ORAL_TABLET | ORAL | 0 refills | Status: DC

## 2017-02-19 NOTE — Telephone Encounter (Signed)
Refilled and LM for pt that refill was sent

## 2017-02-19 NOTE — Telephone Encounter (Signed)
Patient calling to request a refill on his Tramadol 50mg .    [facility] [address] [facility] [address] # for contact:  [phone]  Thank you.

## 2017-02-19 NOTE — Telephone Encounter (Signed)
Refill once 

## 2017-02-24 ENCOUNTER — Telehealth: Payer: Self-pay

## 2017-02-24 MED ORDER — DICLOFENAC SODIUM 75 MG PO TBEC: 75.0000 mg | DELAYED_RELEASE_TABLET | Freq: Two times a day (BID) | ORAL | 1 refills | Status: DC

## 2017-02-24 NOTE — Telephone Encounter (Signed)
[facility] in [facility] to a [facility] on Diclofenac 75mg  Tab for patient. Medication was refilled per Dr. Verlene Mayer request. Sent 180 with one refill

## 2017-03-12 ENCOUNTER — Other Ambulatory Visit: Payer: Self-pay | Admitting: Internal Medicine

## 2017-03-18 ENCOUNTER — Other Ambulatory Visit: Payer: Self-pay | Admitting: Internal Medicine

## 2017-03-18 ENCOUNTER — Telehealth: Payer: Self-pay | Admitting: Neurology

## 2017-03-18 ENCOUNTER — Other Ambulatory Visit: Payer: Self-pay | Admitting: Neurology

## 2017-03-18 NOTE — Telephone Encounter (Signed)
Patient is aware of Dr. Rhea Belton response and will let his dentist know he has spoken with our office.

## 2017-03-18 NOTE — Telephone Encounter (Signed)
Pt is having some teeth pulled on Thurs of this week and would like a call back to discuss his taking clopidogrel (PLAVIX) 75 MG tablet and the potential bleeding as a result of the teeth pulling.  Please call

## 2017-03-18 NOTE — Telephone Encounter (Signed)
I have reviewed her note in October 2017, patient taking Plavix, but there is no evidence of previous stroke, worry about TIA with his visual complaints,  It is okay to stop Plavix peri-dental procedure period of time

## 2017-03-27 ENCOUNTER — Other Ambulatory Visit: Payer: Self-pay | Admitting: Internal Medicine

## 2017-03-27 NOTE — Telephone Encounter (Signed)
He is past due for CPE please contact before refilling

## 2017-05-29 ENCOUNTER — Other Ambulatory Visit: Payer: Self-pay | Admitting: Internal Medicine

## 2017-05-29 ENCOUNTER — Telehealth: Payer: Self-pay | Admitting: Internal Medicine

## 2017-05-29 MED ORDER — TRAMADOL HCL 50 MG PO TABS: ORAL_TABLET | ORAL | 0 refills | Status: DC

## 2017-05-29 NOTE — Telephone Encounter (Signed)
Caller Name: Keaun Schnabel  Best Number: [phone]  Pharmacy: [facility] [address] [facility]  Reason for call: pt is requesting refill on Tramadol, states he will be out of medication before his appointment on 06/09/17

## 2017-05-29 NOTE — Telephone Encounter (Signed)
Refill once 

## 2017-05-29 NOTE — Telephone Encounter (Signed)
Done

## 2017-06-02 ENCOUNTER — Encounter: Payer: Self-pay | Admitting: Internal Medicine

## 2017-06-05 ENCOUNTER — Other Ambulatory Visit: Payer: Self-pay | Admitting: Internal Medicine

## 2017-06-06 ENCOUNTER — Other Ambulatory Visit: Payer: Medicare Other | Admitting: Internal Medicine

## 2017-06-06 DIAGNOSIS — G4733 Obstructive sleep apnea (adult) (pediatric): Secondary | ICD-10-CM

## 2017-06-06 DIAGNOSIS — I10 Essential (primary) hypertension: Secondary | ICD-10-CM

## 2017-06-06 DIAGNOSIS — F329 Major depressive disorder, single episode, unspecified: Secondary | ICD-10-CM

## 2017-06-06 DIAGNOSIS — Z0000 Encounter for general adult medical examination without abnormal findings: Secondary | ICD-10-CM

## 2017-06-06 DIAGNOSIS — Z125 Encounter for screening for malignant neoplasm of prostate: Secondary | ICD-10-CM

## 2017-06-06 DIAGNOSIS — F32A Depression, unspecified: Secondary | ICD-10-CM

## 2017-06-06 LAB — LIPID PANEL
Cholesterol: 180 mg/dL (ref <200)
HDL: 44 mg/dL (ref >40)
LDL Cholesterol (Calc): 113 mg/dL (calc) — ABNORMAL HIGH
Non-HDL Cholesterol (Calc): 136 mg/dL (calc) — ABNORMAL HIGH (ref <130)
Total CHOL/HDL Ratio: 4.1 (calc) (ref <5.0)
Triglycerides: 119 mg/dL (ref <150)

## 2017-06-06 LAB — COMPLETE METABOLIC PANEL WITH GFR
AG Ratio: 0.9 (calc) — ABNORMAL LOW (ref 1.0–2.5)
ALT: 12 U/L (ref 9–46)
AST: 21 U/L (ref 10–35)
Albumin: 3.8 g/dL (ref 3.6–5.1)
Alkaline phosphatase (APISO): 60 U/L (ref 40–115)
BUN/Creatinine Ratio: 15 (calc) (ref 6–22)
BUN: 22 mg/dL (ref 7–25)
CO2: 29 mmol/L (ref 20–32)
Calcium: 9 mg/dL (ref 8.6–10.3)
Chloride: 104 mmol/L (ref 98–110)
Creat: 1.43 mg/dL — ABNORMAL HIGH (ref 0.70–1.18)
GFR, Est African American: 57 mL/min/{1.73_m2} — ABNORMAL LOW (ref >=60)
GFR, Est Non African American: 49 mL/min/{1.73_m2} — ABNORMAL LOW (ref >=60)
Globulin: 4.3 g/dL (calc) — ABNORMAL HIGH (ref 1.9–3.7)
Glucose, Bld: 94 mg/dL (ref 65–99)
Potassium: 4.7 mmol/L (ref 3.5–5.3)
Sodium: 134 mmol/L — ABNORMAL LOW (ref 135–146)
Total Bilirubin: 0.5 mg/dL (ref 0.2–1.2)
Total Protein: 8.1 g/dL (ref 6.1–8.1)

## 2017-06-06 LAB — PSA: PSA: 0.5 ng/mL (ref <=4.0)

## 2017-06-06 LAB — CBC WITH DIFFERENTIAL/PLATELET
Basophils Absolute: 28 cells/uL (ref 0–200)
Basophils Relative: 0.6 %
Eosinophils Absolute: 132 cells/uL (ref 15–500)
Eosinophils Relative: 2.8 %
HCT: 33.2 % — ABNORMAL LOW (ref 38.5–50.0)
Hemoglobin: 11.2 g/dL — ABNORMAL LOW (ref 13.2–17.1)
Lymphs Abs: 1857 cells/uL (ref 850–3900)
MCH: 34.3 pg — ABNORMAL HIGH (ref 27.0–33.0)
MCHC: 33.7 g/dL (ref 32.0–36.0)
MCV: 101.5 fL — ABNORMAL HIGH (ref 80.0–100.0)
MPV: 9.6 fL (ref 7.5–12.5)
Monocytes Relative: 8.8 %
Neutro Abs: 2270 cells/uL (ref 1500–7800)
Neutrophils Relative %: 48.3 %
Platelets: 227 10*3/uL (ref 140–400)
RBC: 3.27 10*6/uL — ABNORMAL LOW (ref 4.20–5.80)
RDW: 12.4 % (ref 11.0–15.0)
Total Lymphocyte: 39.5 %
WBC mixed population: 414 cells/uL (ref 200–950)
WBC: 4.7 10*3/uL (ref 3.8–10.8)

## 2017-06-09 ENCOUNTER — Encounter: Payer: Self-pay | Admitting: Internal Medicine

## 2017-06-09 ENCOUNTER — Ambulatory Visit (INDEPENDENT_AMBULATORY_CARE_PROVIDER_SITE_OTHER): Payer: Medicare Other | Admitting: Internal Medicine

## 2017-06-09 VITALS — BP 104/70 | HR 81 | Temp 97.9°F | Ht 70.0 in | Wt 217.0 lb

## 2017-06-09 DIAGNOSIS — D649 Anemia, unspecified: Secondary | ICD-10-CM

## 2017-06-09 DIAGNOSIS — H349 Unspecified retinal vascular occlusion: Secondary | ICD-10-CM | POA: Diagnosis not present

## 2017-06-09 DIAGNOSIS — M7918 Myalgia, other site: Secondary | ICD-10-CM

## 2017-06-09 DIAGNOSIS — N401 Enlarged prostate with lower urinary tract symptoms: Secondary | ICD-10-CM

## 2017-06-09 DIAGNOSIS — Z7902 Long term (current) use of antithrombotics/antiplatelets: Secondary | ICD-10-CM

## 2017-06-09 DIAGNOSIS — Z0000 Encounter for general adult medical examination without abnormal findings: Secondary | ICD-10-CM | POA: Diagnosis not present

## 2017-06-09 DIAGNOSIS — R351 Nocturia: Secondary | ICD-10-CM

## 2017-06-09 DIAGNOSIS — Z8659 Personal history of other mental and behavioral disorders: Secondary | ICD-10-CM | POA: Diagnosis not present

## 2017-06-09 DIAGNOSIS — Z8614 Personal history of Methicillin resistant Staphylococcus aureus infection: Secondary | ICD-10-CM

## 2017-06-09 DIAGNOSIS — R7989 Other specified abnormal findings of blood chemistry: Secondary | ICD-10-CM

## 2017-06-09 LAB — POCT URINALYSIS DIPSTICK
Bilirubin, UA: NEGATIVE
Blood, UA: NEGATIVE
Glucose, UA: NEGATIVE
Ketones, UA: NEGATIVE
Leukocytes, UA: NEGATIVE
Nitrite, UA: NEGATIVE
Protein, UA: NEGATIVE
Spec Grav, UA: 1.025 (ref 1.010–1.025)
Urobilinogen, UA: 0.2 E.U./dL
pH, UA: 6 (ref 5.0–8.0)
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
## 2017-06-10 LAB — BASIC METABOLIC PANEL WITH GFR
BUN/Creatinine Ratio: 12 (calc) (ref 6–22)
BUN: 20 mg/dL (ref 7–25)
CO2: 26 mmol/L (ref 20–32)
Calcium: 9.4 mg/dL (ref 8.6–10.3)
Chloride: 99 mmol/L (ref 98–110)
Creat: 1.68 mg/dL — ABNORMAL HIGH (ref 0.70–1.18)
GFR, Est African American: 47 mL/min/{1.73_m2} — ABNORMAL LOW (ref >=60)
GFR, Est Non African American: 41 mL/min/{1.73_m2} — ABNORMAL LOW (ref >=60)
Glucose, Bld: 90 mg/dL (ref 65–99)
Potassium: 4.4 mmol/L (ref 3.5–5.3)
Sodium: 133 mmol/L — ABNORMAL LOW (ref 135–146)

## 2017-06-10 LAB — B12 AND FOLATE PANEL
Folate: >24 ng/mL
Vitamin B-12: 802 pg/mL (ref 200–1100)

## 2017-06-10 LAB — RETICULOCYTES
ABS Retic: 47740 cells/uL (ref 25000–9000)
Retic Ct Pct: 1.4 %

## 2017-06-10 LAB — IRON,TIBC AND FERRITIN PANEL
%SAT: 37 % (calc) (ref 15–60)
Ferritin: 147 ng/mL (ref 20–380)
Iron: 124 ug/dL (ref 50–180)
TIBC: 333 mcg/dL (calc) (ref 250–425)

## 2017-07-04 ENCOUNTER — Telehealth: Payer: Self-pay | Admitting: Internal Medicine

## 2017-07-04 MED ORDER — DICLOFENAC SODIUM 75 MG PO TBEC: 75.0000 mg | DELAYED_RELEASE_TABLET | Freq: Two times a day (BID) | ORAL | 1 refills | Status: DC

## 2017-07-04 NOTE — Telephone Encounter (Signed)
Patient would like a refill on his Voltaren please.    [facility]  Thank [facility].

## 2017-07-04 NOTE — Telephone Encounter (Signed)
Refill Voltaren for 6 months

## 2017-07-07 NOTE — Telephone Encounter (Signed)
Escribed

## 2017-07-21 NOTE — Patient Instructions (Signed)
Follow-up in January regarding elevated serum creatinine and anemia.

## 2017-08-07 ENCOUNTER — Encounter (INDEPENDENT_AMBULATORY_CARE_PROVIDER_SITE_OTHER): Payer: Medicare Other | Admitting: Ophthalmology

## 2017-08-07 DIAGNOSIS — H33302 Unspecified retinal break, left eye: Secondary | ICD-10-CM | POA: Diagnosis not present

## 2017-08-07 DIAGNOSIS — H35033 Hypertensive retinopathy, bilateral: Secondary | ICD-10-CM

## 2017-08-07 DIAGNOSIS — H43813 Vitreous degeneration, bilateral: Secondary | ICD-10-CM | POA: Diagnosis not present

## 2017-08-07 DIAGNOSIS — I10 Essential (primary) hypertension: Secondary | ICD-10-CM | POA: Diagnosis not present

## 2017-08-18 ENCOUNTER — Other Ambulatory Visit: Payer: Medicare Other | Admitting: Internal Medicine

## 2017-08-18 ENCOUNTER — Telehealth: Payer: Self-pay | Admitting: Internal Medicine

## 2017-08-18 DIAGNOSIS — D649 Anemia, unspecified: Secondary | ICD-10-CM

## 2017-08-18 LAB — CBC WITH DIFFERENTIAL/PLATELET
Basophils Absolute: 42 cells/uL (ref 0–200)
Basophils Relative: 0.9 %
Eosinophils Absolute: 122 cells/uL (ref 15–500)
Eosinophils Relative: 2.6 %
HCT: 33.2 % — ABNORMAL LOW (ref 38.5–50.0)
Hemoglobin: 11.6 g/dL — ABNORMAL LOW (ref 13.2–17.1)
Lymphs Abs: 2251 cells/uL (ref 850–3900)
MCH: 35.5 pg — ABNORMAL HIGH (ref 27.0–33.0)
MCHC: 34.9 g/dL (ref 32.0–36.0)
MCV: 101.5 fL — ABNORMAL HIGH (ref 80.0–100.0)
MPV: 9.8 fL (ref 7.5–12.5)
Monocytes Relative: 9.4 %
Neutro Abs: 1842 cells/uL (ref 1500–7800)
Neutrophils Relative %: 39.2 %
Platelets: 214 10*3/uL (ref 140–400)
RBC: 3.27 10*6/uL — ABNORMAL LOW (ref 4.20–5.80)
RDW: 12.2 % (ref 11.0–15.0)
Total Lymphocyte: 47.9 %
WBC mixed population: 442 cells/uL (ref 200–950)
WBC: 4.7 10*3/uL (ref 3.8–10.8)

## 2017-08-18 LAB — BASIC METABOLIC PANEL
BUN/Creatinine Ratio: 12 (calc) (ref 6–22)
BUN: 18 mg/dL (ref 7–25)
CO2: 30 mmol/L (ref 20–32)
Calcium: 9.2 mg/dL (ref 8.6–10.3)
Chloride: 104 mmol/L (ref 98–110)
Creat: 1.55 mg/dL — ABNORMAL HIGH (ref 0.70–1.18)
Glucose, Bld: 96 mg/dL (ref 65–99)
Potassium: 4.8 mmol/L (ref 3.5–5.3)
Sodium: 136 mmol/L (ref 135–146)

## 2017-08-18 MED ORDER — TRAMADOL HCL 50 MG PO TABS: ORAL_TABLET | ORAL | 0 refills | Status: DC

## 2017-08-18 NOTE — Telephone Encounter (Signed)
Patient requesting refill on his Tramadol.    [facility] [place]    Thank you.

## 2017-08-18 NOTE — Telephone Encounter (Signed)
[id number] [place] [place] [address] [phone] (Phone) [phone] (Fax)

## 2017-08-21 ENCOUNTER — Ambulatory Visit (INDEPENDENT_AMBULATORY_CARE_PROVIDER_SITE_OTHER): Payer: Medicare Other | Admitting: Internal Medicine

## 2017-08-21 ENCOUNTER — Encounter: Payer: Self-pay | Admitting: Internal Medicine

## 2017-08-21 VITALS — BP 120/82 | HR 70 | Ht 70.0 in | Wt 219.0 lb

## 2017-08-21 DIAGNOSIS — M79641 Pain in right hand: Secondary | ICD-10-CM | POA: Diagnosis not present

## 2017-08-21 DIAGNOSIS — Z8614 Personal history of Methicillin resistant Staphylococcus aureus infection: Secondary | ICD-10-CM | POA: Diagnosis not present

## 2017-08-21 DIAGNOSIS — M79642 Pain in left hand: Secondary | ICD-10-CM | POA: Diagnosis not present

## 2017-08-21 DIAGNOSIS — D649 Anemia, unspecified: Secondary | ICD-10-CM

## 2017-08-21 DIAGNOSIS — N183 Chronic kidney disease, stage 3 unspecified: Secondary | ICD-10-CM

## 2017-08-21 DIAGNOSIS — Z8659 Personal history of other mental and behavioral disorders: Secondary | ICD-10-CM | POA: Diagnosis not present

## 2017-08-21 DIAGNOSIS — Z7901 Long term (current) use of anticoagulants: Secondary | ICD-10-CM

## 2017-08-21 NOTE — Progress Notes (Signed)
   Subjective:    Patient ID: [name]., male    DOB: 02-02-1946, 72 y.o.   MRN: [id number]  HPI 72 year old Male in today to follow-up on concerns about anemia.  His hemoglobin is now 11.6 g and 3 months ago was 11.2 g.  His MCV is stable at 101.5.  B12 and folate levels were checked in November and were normal.  Iron and iron binding capacity checked in November and were normal.  He had a hemoglobin of 12.9 in 2016 and 11.8 in 2017.  He has never exhibited a very high hemoglobin.  Hemoglobin in January 2014 was 14 g as well as in 2012.  Not sure why he has dropped his hemoglobin a little bit over the past few years but it appears to be stable and he is not interested in pursuing it.  He does have chronic kidney disease stage III with creatinine 1.55 and previously was 1.68.  This was explained to him today.  My guess he has had this a number of years as he was turned down  as a kidney transplant donor at [facility] a number of years ago around 2009.  He does have to take chronic NSAIDs therapy consisting of Voltaren for musculoskeletal pain and sees rheumatologist.  He takes tramadol when the pain is worse.  History of MRSA from time to time flares up.  Last flareup was May 2018.  History of sleep apnea.  He is on Plavix per neurologist having had visual disturbance June 02, 2013.  Episode lasted some 15 or 20 minutes without headache.  He saw Dr. Zigmund Daniel who noted a well sealed break left retina around 10:00.  Some 2 weeks later he had the same visual distortion of a double V shaped area in his left visual field.  Lasting some 10-15 minutes and some 3 weeks after that had another episode lasting only 2 or 3 minutes.  Prior history of left retinal detachment and previous retinal laser surgery on the left eye at 10:00.  It was felt he could have an ocular migraine, right occipital lobe pathology or left retinal pathology.  He had an MRI of the brain ordered by Dr. Krista Blue, neurologist showing  nonspecific tiny subcortical and periventricular white matter hyperintensities consistent with small vessel disease.  Carotid Dopplers were negative.  He is followed annually by neurologist.    Review of Systems see above     Objective:   Physical Exam  Spent 25 minutes speaking with him about these issues today.  We will continue to monitor his CBC annually and he is okay with that.  He will need to have semiannual checks on his creatinine.      Assessment & Plan:  Chronic kidney disease  Chronic anticoagulation due to visual disturbance followed by neurologist  Mild anemia-continue to follow-had colonoscopy in 2013  Chronic arthralgias treated with Voltaren by rheumatologist and tramadol  History of depression treated with Wellbutrin  Physical exam due November 29

## 2017-09-13 NOTE — Patient Instructions (Signed)
Continue to monitor CBC and renal functions.  Physical exam due November 2019.

## 2017-09-18 ENCOUNTER — Ambulatory Visit (INDEPENDENT_AMBULATORY_CARE_PROVIDER_SITE_OTHER): Payer: Self-pay | Admitting: Ophthalmology

## 2017-10-20 ENCOUNTER — Other Ambulatory Visit: Payer: Self-pay | Admitting: Internal Medicine

## 2017-10-20 NOTE — Telephone Encounter (Signed)
Refill x 90 days 

## 2017-11-12 DIAGNOSIS — H31002 Unspecified chorioretinal scars, left eye: Secondary | ICD-10-CM | POA: Diagnosis not present

## 2017-11-12 DIAGNOSIS — H35033 Hypertensive retinopathy, bilateral: Secondary | ICD-10-CM | POA: Diagnosis not present

## 2017-11-12 DIAGNOSIS — H35372 Puckering of macula, left eye: Secondary | ICD-10-CM | POA: Diagnosis not present

## 2017-11-12 DIAGNOSIS — Z961 Presence of intraocular lens: Secondary | ICD-10-CM | POA: Diagnosis not present

## 2018-01-30 ENCOUNTER — Other Ambulatory Visit: Payer: Self-pay | Admitting: Internal Medicine

## 2018-02-11 ENCOUNTER — Other Ambulatory Visit: Payer: Self-pay | Admitting: Internal Medicine

## 2018-04-01 ENCOUNTER — Other Ambulatory Visit: Payer: Self-pay | Admitting: Internal Medicine

## 2018-04-14 ENCOUNTER — Telehealth: Payer: Self-pay | Admitting: Internal Medicine

## 2018-04-14 NOTE — Telephone Encounter (Signed)
Schedule visit late next week to discuss and try to figure out.

## 2018-04-14 NOTE — Telephone Encounter (Signed)
Spoke with patient and provided appointment for Friday, 10/4 @ 9:45 a.m.

## 2018-04-14 NOTE — Telephone Encounter (Signed)
Patient states that he has been taking more of the Tramadol lately than normal for his hips.  He wants to know if you think he needs to be referred somewhere or do you need to see him?    He did see Orthopedist a while back and they gave him some exercises to try, but they have not proven to be very helpful.  Should he go back there?  Or, do you think he needs to see a Rheumatologist?  Or, do you want to see him?

## 2018-04-17 ENCOUNTER — Ambulatory Visit (INDEPENDENT_AMBULATORY_CARE_PROVIDER_SITE_OTHER): Payer: Medicare Other | Admitting: Internal Medicine

## 2018-04-17 ENCOUNTER — Encounter: Payer: Self-pay | Admitting: Internal Medicine

## 2018-04-17 VITALS — BP 108/70 | HR 74 | Temp 98.0°F | Ht 70.0 in | Wt 209.0 lb

## 2018-04-17 DIAGNOSIS — M1611 Unilateral primary osteoarthritis, right hip: Secondary | ICD-10-CM

## 2018-04-17 DIAGNOSIS — Z23 Encounter for immunization: Secondary | ICD-10-CM | POA: Diagnosis not present

## 2018-04-18 NOTE — Patient Instructions (Addendum)
To see orthopedist in the near future to consider injection of right hip.

## 2018-04-18 NOTE — Progress Notes (Signed)
   Subjective:    Patient ID: [name]., male    DOB: 11-Nov-1945, 72 y.o.   MRN: [id number]  HPI Patient has chronic musculoskeletal pain and has been seen by Rheumatologist in 2018 for polyarthralgias.  He is managed on tramadol.  When seen by rheumatologist, was diagnosed with lumbar spine pain.  Was complaining of [place] pain thought to be osteoarthritis and recommended PRN NSAIDs and topical anti-inflammatory medication.  Labs in 2017 showed a negative rheumatoid factor, ANA, CCP.  X-rays of the left hip and left knee in 2017 showed mild osteoarthritis changes.    Review of Systems right hip pain and stiffness     Objective:   Physical Exam External rotation of the right hip reveals minimal pain as well as internal rotation of the right hip.  Straight leg raising is negative at 90 degrees.  No tenderness over right greater trochanter       Assessment & Plan:  My feeling is he has right hip osteoarthritis  Plan: I think he would benefit from an injection if this is the case.  He is going to be referred to orthopedist for evaluation.  15 minutes spent with patient and reviewing records

## 2018-04-21 DIAGNOSIS — M545 Low back pain: Secondary | ICD-10-CM | POA: Diagnosis not present

## 2018-04-21 DIAGNOSIS — M47817 Spondylosis without myelopathy or radiculopathy, lumbosacral region: Secondary | ICD-10-CM | POA: Diagnosis not present

## 2018-04-21 DIAGNOSIS — M25551 Pain in right hip: Secondary | ICD-10-CM | POA: Diagnosis not present

## 2018-04-30 ENCOUNTER — Other Ambulatory Visit: Payer: Self-pay | Admitting: Neurology

## 2018-04-30 ENCOUNTER — Other Ambulatory Visit: Payer: Self-pay | Admitting: Internal Medicine

## 2018-04-30 DIAGNOSIS — M545 Low back pain: Secondary | ICD-10-CM | POA: Diagnosis not present

## 2018-05-04 DIAGNOSIS — M25551 Pain in right hip: Secondary | ICD-10-CM | POA: Diagnosis not present

## 2018-05-04 DIAGNOSIS — M47817 Spondylosis without myelopathy or radiculopathy, lumbosacral region: Secondary | ICD-10-CM | POA: Diagnosis not present

## 2018-05-11 DIAGNOSIS — M5416 Radiculopathy, lumbar region: Secondary | ICD-10-CM | POA: Diagnosis not present

## 2018-06-02 DIAGNOSIS — M545 Low back pain: Secondary | ICD-10-CM | POA: Diagnosis not present

## 2018-06-15 DIAGNOSIS — M5416 Radiculopathy, lumbar region: Secondary | ICD-10-CM | POA: Diagnosis not present

## 2018-06-15 DIAGNOSIS — M47817 Spondylosis without myelopathy or radiculopathy, lumbosacral region: Secondary | ICD-10-CM | POA: Diagnosis not present

## 2018-06-23 ENCOUNTER — Other Ambulatory Visit: Payer: Self-pay | Admitting: Internal Medicine

## 2018-06-23 DIAGNOSIS — R351 Nocturia: Secondary | ICD-10-CM

## 2018-06-23 DIAGNOSIS — N183 Chronic kidney disease, stage 3 unspecified: Secondary | ICD-10-CM

## 2018-06-23 DIAGNOSIS — D649 Anemia, unspecified: Secondary | ICD-10-CM

## 2018-06-23 DIAGNOSIS — N401 Enlarged prostate with lower urinary tract symptoms: Secondary | ICD-10-CM

## 2018-06-23 DIAGNOSIS — Z0000 Encounter for general adult medical examination without abnormal findings: Secondary | ICD-10-CM

## 2018-06-23 DIAGNOSIS — E7800 Pure hypercholesterolemia, unspecified: Secondary | ICD-10-CM

## 2018-06-23 DIAGNOSIS — M1611 Unilateral primary osteoarthritis, right hip: Secondary | ICD-10-CM

## 2018-06-25 ENCOUNTER — Encounter: Payer: Self-pay | Admitting: Internal Medicine

## 2018-06-26 ENCOUNTER — Ambulatory Visit (INDEPENDENT_AMBULATORY_CARE_PROVIDER_SITE_OTHER): Payer: Medicare Other | Admitting: Internal Medicine

## 2018-06-26 ENCOUNTER — Encounter: Payer: Self-pay | Admitting: Internal Medicine

## 2018-06-26 VITALS — BP 120/90 | HR 64 | Ht 70.0 in | Wt 206.0 lb

## 2018-06-26 DIAGNOSIS — D649 Anemia, unspecified: Secondary | ICD-10-CM

## 2018-06-26 DIAGNOSIS — Z0000 Encounter for general adult medical examination without abnormal findings: Secondary | ICD-10-CM

## 2018-06-26 DIAGNOSIS — N183 Chronic kidney disease, stage 3 unspecified: Secondary | ICD-10-CM

## 2018-06-26 DIAGNOSIS — Z8659 Personal history of other mental and behavioral disorders: Secondary | ICD-10-CM

## 2018-06-26 DIAGNOSIS — M1611 Unilateral primary osteoarthritis, right hip: Secondary | ICD-10-CM

## 2018-06-26 DIAGNOSIS — N401 Enlarged prostate with lower urinary tract symptoms: Secondary | ICD-10-CM

## 2018-06-26 DIAGNOSIS — E7800 Pure hypercholesterolemia, unspecified: Secondary | ICD-10-CM

## 2018-06-26 DIAGNOSIS — R351 Nocturia: Secondary | ICD-10-CM

## 2018-06-26 DIAGNOSIS — Z8614 Personal history of Methicillin resistant Staphylococcus aureus infection: Secondary | ICD-10-CM

## 2018-06-26 LAB — POCT URINALYSIS DIPSTICK
Appearance: NEGATIVE
Bilirubin, UA: NEGATIVE
Blood, UA: NEGATIVE
Glucose, UA: NEGATIVE
Ketones, UA: NEGATIVE
Leukocytes, UA: NEGATIVE
Nitrite, UA: NEGATIVE
Odor: NEGATIVE
Protein, UA: NEGATIVE
Spec Grav, UA: 1.01 (ref 1.010–1.025)
Urobilinogen, UA: 0.2 E.U./dL
pH, UA: 6.5 (ref 5.0–8.0)
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
## 2018-06-27 LAB — COMPLETE METABOLIC PANEL WITH GFR
AG Ratio: 0.8 (calc) — ABNORMAL LOW (ref 1.0–2.5)
ALT: 12 U/L (ref 9–46)
AST: 22 U/L (ref 10–35)
Albumin: 4 g/dL (ref 3.6–5.1)
Alkaline phosphatase (APISO): 58 U/L (ref 40–115)
BUN/Creatinine Ratio: 13 (calc) (ref 6–22)
BUN: 20 mg/dL (ref 7–25)
CO2: 27 mmol/L (ref 20–32)
Calcium: 9.4 mg/dL (ref 8.6–10.3)
Chloride: 102 mmol/L (ref 98–110)
Creat: 1.58 mg/dL — ABNORMAL HIGH (ref 0.70–1.18)
GFR, Est African American: 50 mL/min/{1.73_m2} — ABNORMAL LOW (ref >=60)
GFR, Est Non African American: 43 mL/min/{1.73_m2} — ABNORMAL LOW (ref >=60)
Globulin: 4.8 g/dL (calc) — ABNORMAL HIGH (ref 1.9–3.7)
Glucose, Bld: 89 mg/dL (ref 65–99)
Potassium: 4.8 mmol/L (ref 3.5–5.3)
Sodium: 133 mmol/L — ABNORMAL LOW (ref 135–146)
Total Bilirubin: 0.5 mg/dL (ref 0.2–1.2)
Total Protein: 8.8 g/dL — ABNORMAL HIGH (ref 6.1–8.1)

## 2018-06-27 LAB — CBC WITH DIFFERENTIAL/PLATELET
Basophils Absolute: 38 cells/uL (ref 0–200)
Basophils Relative: 0.7 %
Eosinophils Absolute: 113 cells/uL (ref 15–500)
Eosinophils Relative: 2.1 %
HCT: 32.8 % — ABNORMAL LOW (ref 38.5–50.0)
Hemoglobin: 11.2 g/dL — ABNORMAL LOW (ref 13.2–17.1)
Lymphs Abs: 2246 cells/uL (ref 850–3900)
MCH: 35.1 pg — ABNORMAL HIGH (ref 27.0–33.0)
MCHC: 34.1 g/dL (ref 32.0–36.0)
MCV: 102.8 fL — ABNORMAL HIGH (ref 80.0–100.0)
MPV: 9.3 fL (ref 7.5–12.5)
Monocytes Relative: 9.3 %
Neutro Abs: 2500 cells/uL (ref 1500–7800)
Neutrophils Relative %: 46.3 %
Platelets: 290 10*3/uL (ref 140–400)
RBC: 3.19 10*6/uL — ABNORMAL LOW (ref 4.20–5.80)
RDW: 12.5 % (ref 11.0–15.0)
Total Lymphocyte: 41.6 %
WBC mixed population: 502 cells/uL (ref 200–950)
WBC: 5.4 10*3/uL (ref 3.8–10.8)

## 2018-06-27 LAB — LIPID PANEL
Cholesterol: 195 mg/dL (ref <200)
HDL: 44 mg/dL (ref >40)
LDL Cholesterol (Calc): 129 mg/dL (calc) — ABNORMAL HIGH
Non-HDL Cholesterol (Calc): 151 mg/dL (calc) — ABNORMAL HIGH (ref <130)
Total CHOL/HDL Ratio: 4.4 (calc) (ref <5.0)
Triglycerides: 117 mg/dL (ref <150)

## 2018-06-27 LAB — PSA: PSA: 0.7 ng/mL (ref <=4.0)

## 2018-07-04 DIAGNOSIS — M5416 Radiculopathy, lumbar region: Secondary | ICD-10-CM | POA: Diagnosis not present

## 2018-07-17 ENCOUNTER — Telehealth: Payer: Self-pay

## 2018-07-17 DIAGNOSIS — M48061 Spinal stenosis, lumbar region without neurogenic claudication: Secondary | ICD-10-CM | POA: Diagnosis not present

## 2018-07-17 DIAGNOSIS — M47896 Other spondylosis, lumbar region: Secondary | ICD-10-CM | POA: Diagnosis not present

## 2018-07-17 MED ORDER — CELECOXIB 100 MG PO CAPS: 100.0000 mg | ORAL_CAPSULE | Freq: Every day | ORAL | 2 refills | Status: DC

## 2018-07-17 NOTE — Telephone Encounter (Signed)
Call in Celebrex a one year

## 2018-07-17 NOTE — Telephone Encounter (Signed)
Both of these meds are anti inflammatory medications and have effect on bleeding times. Tylenol does not do this nor Tramadol. We can call in which ever one he wants but with his kidneys better to stay away from these if possible.

## 2018-07-17 NOTE — Telephone Encounter (Signed)
Celebrex and diclofenac are in the same category of anti-inflammatory meds

## 2018-07-17 NOTE — Telephone Encounter (Signed)
He wants celebrex.

## 2018-07-17 NOTE — Telephone Encounter (Signed)
Done

## 2018-07-17 NOTE — Addendum Note (Signed)
Addended by: Mady Haagensen on: 07/17/2018 05:19 PM   Modules accepted: Orders

## 2018-07-17 NOTE — Telephone Encounter (Signed)
Patient called states he is out of the diclofenac, he wants to know if it's okay to take celebrex instead for his back he said the orthopedist said it will be better to take celebrex with him being on plavix and bc his blood was too thin and he thinks the diclofenac was contributing to it.

## 2018-08-02 NOTE — Patient Instructions (Signed)
Continue current medications and follow-up in 1 year or as needed.

## 2018-08-11 ENCOUNTER — Encounter (INDEPENDENT_AMBULATORY_CARE_PROVIDER_SITE_OTHER): Payer: Self-pay | Admitting: Ophthalmology

## 2018-08-11 DIAGNOSIS — M47816 Spondylosis without myelopathy or radiculopathy, lumbar region: Secondary | ICD-10-CM | POA: Diagnosis not present

## 2018-08-26 ENCOUNTER — Encounter (INDEPENDENT_AMBULATORY_CARE_PROVIDER_SITE_OTHER): Payer: Medicare Other | Admitting: Ophthalmology

## 2018-08-26 DIAGNOSIS — H33302 Unspecified retinal break, left eye: Secondary | ICD-10-CM

## 2018-08-26 DIAGNOSIS — H43813 Vitreous degeneration, bilateral: Secondary | ICD-10-CM

## 2018-08-26 DIAGNOSIS — H35033 Hypertensive retinopathy, bilateral: Secondary | ICD-10-CM | POA: Diagnosis not present

## 2018-08-26 DIAGNOSIS — I10 Essential (primary) hypertension: Secondary | ICD-10-CM | POA: Diagnosis not present

## 2018-10-05 DIAGNOSIS — M4120 Other idiopathic scoliosis, site unspecified: Secondary | ICD-10-CM | POA: Diagnosis not present

## 2018-10-05 DIAGNOSIS — R030 Elevated blood-pressure reading, without diagnosis of hypertension: Secondary | ICD-10-CM | POA: Diagnosis not present

## 2018-10-05 DIAGNOSIS — M5416 Radiculopathy, lumbar region: Secondary | ICD-10-CM | POA: Diagnosis not present

## 2018-10-05 DIAGNOSIS — M48061 Spinal stenosis, lumbar region without neurogenic claudication: Secondary | ICD-10-CM | POA: Diagnosis not present

## 2019-01-06 ENCOUNTER — Other Ambulatory Visit: Payer: Self-pay | Admitting: Neurosurgery

## 2019-01-06 ENCOUNTER — Other Ambulatory Visit: Payer: Self-pay | Admitting: *Deleted

## 2019-01-13 ENCOUNTER — Ambulatory Visit: Payer: Self-pay | Admitting: Neurology

## 2019-02-09 ENCOUNTER — Encounter: Payer: Self-pay | Admitting: Vascular Surgery

## 2019-02-09 ENCOUNTER — Ambulatory Visit (INDEPENDENT_AMBULATORY_CARE_PROVIDER_SITE_OTHER): Payer: Medicare Other | Admitting: Vascular Surgery

## 2019-02-09 ENCOUNTER — Other Ambulatory Visit: Payer: Self-pay

## 2019-02-09 VITALS — BP 119/82 | HR 88 | Temp 98.3°F | Resp 20 | Ht 70.0 in | Wt 210.0 lb

## 2019-02-09 DIAGNOSIS — M5137 Other intervertebral disc degeneration, lumbosacral region: Secondary | ICD-10-CM | POA: Diagnosis not present

## 2019-02-09 NOTE — Progress Notes (Signed)
[facility] [place]  Patient name: [name]. MRN: [id number] DOB: 23-Aug-1945 Sex: male  REASON FOR CONSULT: Discuss anterior exposure for L5-S1 surgery  HPI: [name]. is a 73 y.o. male, who is seen today for discussion of anterior exposure.  He has been evaluated by Dr. Dierdre Harness who has recommended fusion at L5-S1 from anterior approach.  Patient has had progressively severe back discomfort and remains very active and this is quite limiting to him.  He has failed conservative treatment.  He has had prior umbilical hernia and bilateral inguinal hernia repair with mesh.  No other intra-abdominal surgeries.  He is a very healthy with no cardiac difficulty.  Past Medical History:  Diagnosis Date  . Arthritis   . Blood transfusion without reported diagnosis 1969  . BPH (benign prostatic hyperplasia)   . Cataract    x2  . Chronic cough   . Colon polyp    2 adenomas2004, max 7 mm  . Detached retina 2012   Dr. Zigmund Daniel  . Gout   . HTN (hypertension)    recent, mild  . Leg pain   . Lower back pain   . Lumbar foraminal stenosis   . Lumbar radiculopathy   . Scoliosis (and kyphoscoliosis), idiopathic   . Sleep apnea   . Umbilical hernia 6333   hernia repair    Family History  Problem Relation Age of Onset  . Lung cancer Mother        lung   . Lung cancer Father        lung  . CAD Father 13  . CAD Maternal Grandmother 70    SOCIAL HISTORY: Social History   Socioeconomic History  . Marital status: Married    Spouse name: [name]  . Number of children: 1  . Years of education: college  . Highest education level: Not on file  Occupational History  . 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 Financial resource strain: Not on file  . Food insecurity    Worry: Not on file    Inability: Not on file  . Transportation needs    Medical: Not on file    Non-medical:  Not on file  Tobacco Use  . Smoking status: Never Smoker  . Smokeless tobacco: Never Used  Substance and Sexual Activity  . Alcohol use: Yes    Alcohol/week: 1.0 standard drinks    Types: 1 Shots of liquor per week    Comment: social  . Drug use: No  . Sexual activity: Not on file  Lifestyle  . Physical activity    Days per week: Not on file    Minutes per session: Not on file  . Stress: Not on file  Relationships  . Social Herbalist on phone: Not on file    Gets together: Not on file    Attends religious service: Not on file    Active member of club or organization: Not on file    Attends meetings of clubs or organizations: Not on file    Relationship status: Not on file  . Intimate partner violence    Fear of current or ex partner: Not on file    Emotionally abused: Not on file    Physically abused: Not on file    Forced sexual activity: Not on file  Other Topics Concern  . 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   Patient is married and lives at home with his wife [name]).          No Known Allergies  Current Outpatient Medications  Medication Sig Dispense Refill  . B Complex Vitamins (VITAMIN-B COMPLEX PO) Take 1 tablet by mouth daily.    Marland Kitchen buPROPion (WELLBUTRIN SR) 100 MG 12 hr tablet TAKE 1 TABLET BY MOUTH TWO  TIMES DAILY (Patient taking differently: Take 100 mg by mouth daily. ) 180 tablet 1  . clopidogrel (PLAVIX) 75 MG tablet Take 1 tablet (75 mg total) by mouth daily. Please call [phone] for appt or request refills from PCP. 90 tablet 3  . Coenzyme Q10 (CO Q 10 PO) Take 500 mg by mouth daily.    . Multiple Vitamins-Minerals (MULTIVITAMIN,TX-MINERALS) tablet Take 1 tablet by mouth daily.      . tamsulosin (FLOMAX) 0.4 MG CAPS capsule TAKE 1 CAPSULE BY MOUTH  DAILY 90 capsule 3  . traMADol (ULTRAM) 50  MG tablet TAKE 1 TABLET 3 TIMES A DAY AS NEEDED 60 tablet 2  . celecoxib (CELEBREX) 100 MG capsule Take 1 capsule (100 mg total) by mouth daily. (Patient not taking: Reported on 02/09/2019) 90 capsule 2  . Cholecalciferol (VITAMIN D) 1000 UNITS capsule Take 1,000 Units by mouth daily.    . diclofenac (VOLTAREN) 75 MG EC tablet TAKE 1 TABLET BY MOUTH TWICE A DAY (Patient not taking: Reported on 02/09/2019) 180 tablet 1   No current facility-administered medications for this visit.     REVIEW OF SYSTEMS:  [X]  denotes positive finding, [ ]  denotes negative finding Cardiac  Comments:  Chest pain or chest pressure:    Shortness of breath upon exertion:    Short of breath when lying flat:    Irregular heart rhythm:        Vascular    Pain in calf, thigh, or hip brought on by ambulation:    Pain in feet at night that wakes you up from your sleep:     Blood clot in your veins:    Leg swelling:         Pulmonary    Oxygen at home:    Productive cough:     Wheezing:         Neurologic    Sudden weakness in arms or legs:     Sudden numbness in arms or legs:     Sudden onset of difficulty speaking or slurred speech:    Temporary loss of vision in one eye:     Problems with dizziness:         Gastrointestinal    Blood in stool:     Vomited blood:         Genitourinary    Burning when urinating:     Blood in urine:        Psychiatric    Major depression:         Hematologic    Bleeding problems:    Problems with blood clotting too easily:        Skin    Rashes or ulcers:        Constitutional    Fever or chills:      PHYSICAL EXAM: Vitals:   02/09/19 1514  BP: 119/82  Pulse: 88  Resp: 20  Temp: 98.3 F (36.8  C)  SpO2: 96%  Weight: 210 lb (95.3 kg)  Height: 5\' 10"  (1.778 m)    GENERAL: The patient is a well-nourished male, in no acute distress. The vital signs are documented above. CARDIOVASCULAR: 2+ radial and 2+ dorsalis pedis pulses bilaterally PULMONARY:  There is good air exchange  ABDOMEN: Soft and non-tender  MUSCULOSKELETAL: There are no major deformities or cyanosis. NEUROLOGIC: No focal weakness or paresthesias are detected. SKIN: There are no ulcers or rashes noted. PSYCHIATRIC: The patient has a normal affect.  DATA:  I do have plain films from March 2020 of his lumbar spine.  He does have moderate atherosclerotic change of his aorta iliac segments on plain films.  MEDICAL ISSUES: Had a long discussion regarding my role in exposure.  Explained mobilization of the rectus muscle, intraperitoneal contents, left ureter, arterial and venous structures overlying the spine.  Also explained the potential injury to all of these particularly venous injury.  He has had prior mesh repair of inguinal hernia and umbilical hernia.  I explained that this can make mobilization and dissection somewhat more difficult but certainly not prohibitive.  Do not see any contraindication to surgery.  His aortic and iliac calcification should not be limiting at the L5-S1 level.  He understands and wishes to proceed as planned for surgery on 02/22/2019   Rosetta Posner, MD [facility] Tel [phone] Pager [phone]

## 2019-02-12 NOTE — Pre-Procedure Instructions (Signed)
[id number] [place], [place] - [address] AT [address] [place] [address] Phone: [phone] Fax: [phone]  [facility] [name], [name] [address] Phone: [phone] Fax: [phone]  [name] [address] [facility] [address] [name] [address] Phone: [phone] Fax: [phone]      Your procedure is scheduled on Monday August 3rd.  Report to [facility] Main Entrance "A" at 5:30 A.M., and check in at the Admitting office.  Call this number if you have problems the morning of surgery:  [phone]  Call [phone] if you have any questions prior to your surgery date Monday-Friday 8am-4pm    Remember:  Do not eat or drink after midnight the night before your surgery   Take these medicines the morning of surgery with A SIP OF WATER  buPROPion (WELLBUTRIN SR)  tamsulosin (FLOMAX) traMADol (ULTRAM)- if needed  Follow your surgeon's instructions on when to stop clopidogrel (PLAVIX).  If no instructions were given by your surgeon then you will need to call the office to get those instructions.     7 days prior to surgery STOP taking any Aspirin (unless otherwise instructed by your surgeon), Aleve, Naproxen, Ibuprofen, Motrin, Advil, Goody's, BC's, all herbal medications, fish oil, and all vitamins.    The Morning of Surgery  Do not wear jewelry, make-up or nail polish.  Do not wear lotions, powders, or perfumes/colognes, or deodorant  Do not shave 48 hours prior to surgery.  Men may shave face and neck.  Do not bring valuables to the hospital.  [facility] is not responsible for any belongings or valuables.  If you are a smoker, DO NOT Smoke 24 hours prior to surgery IF you wear a CPAP at night please bring your mask, tubing, and machine the morning of surgery   Remember  that you must have someone to transport you home after your surgery, and remain with you for 24 hours if you are discharged the same day.   Contacts, glasses, hearing aids, dentures or bridgework may not be worn into surgery.    Leave your suitcase in the car.  After surgery it may be brought to your room.  For patients admitted to the hospital, discharge time will be determined by your treatment team.  Patients discharged the day of surgery will not be allowed to drive home.    Special instructions:   [name]- Preparing For Surgery  Before surgery, you can play an important role. Because skin is not sterile, your skin needs to be as free of germs as possible. You can reduce the number of germs on your skin by washing with CHG (chlorahexidine gluconate) Soap before surgery.  CHG is an antiseptic cleaner which kills germs and bonds with the skin to continue killing germs even after washing.    Oral Hygiene is also important to reduce your risk of infection.  Remember - BRUSH YOUR TEETH THE MORNING OF SURGERY WITH YOUR REGULAR TOOTHPASTE  Please do not use if you have an allergy to CHG or antibacterial soaps. If your skin becomes reddened/irritated stop using the CHG.  Do not shave (including legs and underarms) for at least 48 hours prior to first CHG shower. It is OK to shave your face.  Please follow these instructions carefully.   1. Shower the [employer] BEFORE SURGERY  and the MORNING OF SURGERY with CHG Soap.   2. If you chose to wash your hair, wash your hair first as usual with your normal shampoo.  3. After you shampoo, rinse your hair and body thoroughly to remove the shampoo.  4. Use CHG as you would any other liquid soap. You can apply CHG directly to the skin and wash gently with a scrungie or a clean washcloth.   5. Apply the CHG Soap to your body ONLY FROM THE NECK DOWN.  Do not use on open wounds or open sores. Avoid contact with your eyes, ears, mouth and genitals  (private parts). Wash Face and genitals (private parts)  with your normal soap.   6. Wash thoroughly, paying special attention to the area where your surgery will be performed.  7. Thoroughly rinse your body with warm water from the neck down.  8. DO NOT shower/wash with your normal soap after using and rinsing off the CHG Soap.  9. Pat yourself dry with a CLEAN TOWEL.  10. Wear CLEAN PAJAMAS to bed the night before surgery, wear comfortable clothes the morning of surgery  11. Place CLEAN SHEETS on your bed the night of your first shower and DO NOT SLEEP WITH PETS.    Day of Surgery:  Do not apply any deodorants/lotions. Please shower the morning of surgery with the CHG soap  Please wear clean clothes to the hospital/surgery center.   Remember to brush your teeth WITH YOUR REGULAR TOOTHPASTE.   Please read over the following fact sheets that you were given.

## 2019-02-15 ENCOUNTER — Other Ambulatory Visit: Payer: Self-pay

## 2019-02-15 ENCOUNTER — Encounter (HOSPITAL_COMMUNITY): Payer: Self-pay

## 2019-02-15 ENCOUNTER — Encounter (HOSPITAL_COMMUNITY)
Admission: RE | Admit: 2019-02-15 | Discharge: 2019-02-15 | Disposition: A | Discharge status: Home / Self Care | Payer: Medicare Other | Source: Ambulatory Visit | Attending: Neurosurgery | Admitting: Neurosurgery

## 2019-02-15 DIAGNOSIS — N400 Enlarged prostate without lower urinary tract symptoms: Secondary | ICD-10-CM | POA: Insufficient documentation

## 2019-02-15 DIAGNOSIS — G4733 Obstructive sleep apnea (adult) (pediatric): Secondary | ICD-10-CM | POA: Diagnosis not present

## 2019-02-15 DIAGNOSIS — Z7902 Long term (current) use of antithrombotics/antiplatelets: Secondary | ICD-10-CM | POA: Diagnosis not present

## 2019-02-15 DIAGNOSIS — F329 Major depressive disorder, single episode, unspecified: Secondary | ICD-10-CM | POA: Diagnosis not present

## 2019-02-15 DIAGNOSIS — I129 Hypertensive chronic kidney disease with stage 1 through stage 4 chronic kidney disease, or unspecified chronic kidney disease: Secondary | ICD-10-CM | POA: Insufficient documentation

## 2019-02-15 DIAGNOSIS — M1990 Unspecified osteoarthritis, unspecified site: Secondary | ICD-10-CM | POA: Insufficient documentation

## 2019-02-15 DIAGNOSIS — Z79899 Other long term (current) drug therapy: Secondary | ICD-10-CM | POA: Diagnosis not present

## 2019-02-15 DIAGNOSIS — N189 Chronic kidney disease, unspecified: Secondary | ICD-10-CM | POA: Insufficient documentation

## 2019-02-15 DIAGNOSIS — M48061 Spinal stenosis, lumbar region without neurogenic claudication: Secondary | ICD-10-CM | POA: Insufficient documentation

## 2019-02-15 DIAGNOSIS — Z01818 Encounter for other preprocedural examination: Secondary | ICD-10-CM | POA: Diagnosis not present

## 2019-02-15 DIAGNOSIS — Z20828 Contact with and (suspected) exposure to other viral communicable diseases: Secondary | ICD-10-CM | POA: Insufficient documentation

## 2019-02-15 HISTORY — DX: Chronic kidney disease, unspecified: N18.9

## 2019-02-15 HISTORY — DX: Depression, unspecified: F32.A

## 2019-02-15 LAB — BASIC METABOLIC PANEL
Anion gap: 5 (ref 5–15)
BUN: 15 mg/dL (ref 8–23)
CO2: 24 mmol/L (ref 22–32)
Calcium: 9.3 mg/dL (ref 8.9–10.3)
Chloride: 103 mmol/L (ref 98–111)
Creatinine, Ser: 1.48 mg/dL — ABNORMAL HIGH (ref 0.61–1.24)
GFR calc Af Amer: 54 mL/min — ABNORMAL LOW (ref >60)
GFR calc non Af Amer: 47 mL/min — ABNORMAL LOW (ref >60)
Glucose, Bld: 98 mg/dL (ref 70–99)
Potassium: 4.5 mmol/L (ref 3.5–5.1)
Sodium: 132 mmol/L — ABNORMAL LOW (ref 135–145)

## 2019-02-15 LAB — CBC
HCT: 34.5 % — ABNORMAL LOW (ref 39.0–52.0)
Hemoglobin: 11.7 g/dL — ABNORMAL LOW (ref 13.0–17.0)
MCH: 36 pg — ABNORMAL HIGH (ref 26.0–34.0)
MCHC: 33.9 g/dL (ref 30.0–36.0)
MCV: 106.2 fL — ABNORMAL HIGH (ref 80.0–100.0)
Platelets: 222 10*3/uL (ref 150–400)
RBC: 3.25 MIL/uL — ABNORMAL LOW (ref 4.22–5.81)
RDW: 13.3 % (ref 11.5–15.5)
WBC: 5.1 10*3/uL (ref 4.0–10.5)
nRBC: 0 % (ref 0.0–0.2)

## 2019-02-15 LAB — ABO/RH: ABO/RH(D): O POS

## 2019-02-15 LAB — SURGICAL PCR SCREEN
MRSA, PCR: NEGATIVE
Staphylococcus aureus: POSITIVE — AB

## 2019-02-15 LAB — TYPE AND SCREEN
ABO/RH(D): O POS
Antibody Screen: NEGATIVE

## 2019-02-15 NOTE — Progress Notes (Signed)
PCP - Tedra [place] Cardiologist - denies did see one 15 years ago  Chest x-ray - not needed EKG - 02/15/19 Stress Test - 2004 ECHO - denies Cardiac Cath - 2001  Sleep Study - >7 yeas ago CPAP - NO  Blood Thinner Instructions: last dose plavix 02/14/19 Aspirin Instructions: n/a  Anesthesia review: yes, record review  Patient denies shortness of breath, fever, cough and chest pain at PAT appointment   Patient verbalized understanding of instructions that were given to them at the PAT appointment. Patient was also instructed that they will need to review over the PAT instructions again at home before surgery.

## 2019-02-15 NOTE — Progress Notes (Signed)
   02/15/19 1532  Complete When Order for Surgery Written:  Pre-op Surgical PCR Testing (All Areas )  Swab Result Positive? Staph Aureus  Patient Notified? Yes  Date Notified 02/15/19 (prescription called in at [facility] [phone], patient notifed via mesage with permission)    Patient answered the phone, as I was leaving message, patient verbalized understanding of instructions

## 2019-02-16 ENCOUNTER — Encounter (HOSPITAL_COMMUNITY): Payer: Self-pay
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
## 2019-02-16 NOTE — Anesthesia Preprocedure Evaluation (Addendum)
Anesthesia Evaluation    Reviewed: Allergy & Precautions, Patient's Chart, lab work & pertinent test results  History of Anesthesia Complications Negative for: history of anesthetic complications  Airway Mallampati: II  TM Distance: >3 FB Neck ROM: Full    Dental no notable dental hx. (+) Dental Advisory Given   Pulmonary sleep apnea ,    Pulmonary exam normal        Cardiovascular hypertension, Normal cardiovascular exam  Nuclear stress test 08/03/12: Overall Impression: Normal stress nuclear study. LV Ejection Fraction: 55%. LV Wall Motion: NL LV Function; NL Wall Motion   Neuro/Psych PSYCHIATRIC DISORDERS Depression negative neurological ROS     GI/Hepatic negative GI ROS, Neg liver ROS,   Endo/Other  negative endocrine ROS  Renal/GU Renal InsufficiencyRenal disease     Musculoskeletal negative musculoskeletal ROS (+)   Abdominal   Peds  Hematology negative hematology ROS (+)   Anesthesia Other Findings Day of surgery medications reviewed with the patient.  Reproductive/Obstetrics                           Anesthesia Physical Anesthesia Plan  ASA: II  Anesthesia Plan: General   Post-op Pain Management:    Induction: Intravenous  PONV Risk Score and Plan: 3 and Ondansetron, Dexamethasone and Midazolam  Airway Management Planned:   Additional Equipment:   Intra-op Plan:   Post-operative Plan: Extubation in OR  Informed Consent: I have reviewed the patients History and Physical, chart, labs and discussed the procedure including the risks, benefits and alternatives for the proposed anesthesia with the patient or authorized representative who has indicated his/her understanding and acceptance.     Dental advisory given  Plan Discussed with: CRNA and Anesthesiologist  Anesthesia Plan Comments: (PAT note written 02/16/2019 by Myra Gianotti, PA-C. )      Anesthesia  Quick Evaluation

## 2019-02-18 ENCOUNTER — Other Ambulatory Visit (HOSPITAL_COMMUNITY)
Admission: RE | Admit: 2019-02-18 | Discharge: 2019-02-18 | Disposition: A | Discharge status: Home / Self Care | Payer: Medicare Other | Source: Ambulatory Visit | Attending: Neurosurgery | Admitting: Neurosurgery

## 2019-02-18 DIAGNOSIS — Z79899 Other long term (current) drug therapy: Secondary | ICD-10-CM | POA: Diagnosis not present

## 2019-02-18 DIAGNOSIS — M48061 Spinal stenosis, lumbar region without neurogenic claudication: Secondary | ICD-10-CM | POA: Diagnosis not present

## 2019-02-18 DIAGNOSIS — Z01818 Encounter for other preprocedural examination: Secondary | ICD-10-CM | POA: Diagnosis not present

## 2019-02-18 DIAGNOSIS — G4733 Obstructive sleep apnea (adult) (pediatric): Secondary | ICD-10-CM | POA: Diagnosis not present

## 2019-02-18 DIAGNOSIS — M1990 Unspecified osteoarthritis, unspecified site: Secondary | ICD-10-CM | POA: Diagnosis not present

## 2019-02-18 DIAGNOSIS — N189 Chronic kidney disease, unspecified: Secondary | ICD-10-CM | POA: Diagnosis not present

## 2019-02-18 DIAGNOSIS — Z7902 Long term (current) use of antithrombotics/antiplatelets: Secondary | ICD-10-CM | POA: Diagnosis not present

## 2019-02-18 DIAGNOSIS — I129 Hypertensive chronic kidney disease with stage 1 through stage 4 chronic kidney disease, or unspecified chronic kidney disease: Secondary | ICD-10-CM | POA: Diagnosis not present

## 2019-02-18 LAB — SARS CORONAVIRUS 2 (TAT 6-24 HRS): SARS Coronavirus 2: NEGATIVE

## 2019-02-22 ENCOUNTER — Inpatient Hospital Stay (HOSPITAL_COMMUNITY): Payer: Medicare Other | Admitting: Vascular Surgery

## 2019-02-22 ENCOUNTER — Other Ambulatory Visit: Payer: Self-pay

## 2019-02-22 ENCOUNTER — Inpatient Hospital Stay (HOSPITAL_COMMUNITY)
Admission: RE | Admit: 2019-02-22 | Discharge: 2019-02-23 | DRG: 460 | Disposition: A | Discharge status: Home / Self Care | Payer: Medicare Other | Attending: Neurosurgery | Admitting: Neurosurgery

## 2019-02-22 ENCOUNTER — Inpatient Hospital Stay (HOSPITAL_COMMUNITY): Payer: Medicare Other

## 2019-02-22 ENCOUNTER — Encounter (HOSPITAL_COMMUNITY): Payer: Self-pay

## 2019-02-22 ENCOUNTER — Encounter (HOSPITAL_COMMUNITY)
Admission: RE | Disposition: A | Discharge status: Home / Self Care | Payer: Self-pay | Source: Home / Self Care | Attending: Neurosurgery

## 2019-02-22 ENCOUNTER — Inpatient Hospital Stay (HOSPITAL_COMMUNITY): Payer: Medicare Other | Admitting: Anesthesiology

## 2019-02-22 DIAGNOSIS — Z79899 Other long term (current) drug therapy: Secondary | ICD-10-CM

## 2019-02-22 DIAGNOSIS — M47817 Spondylosis without myelopathy or radiculopathy, lumbosacral region: Secondary | ICD-10-CM | POA: Diagnosis present

## 2019-02-22 DIAGNOSIS — M5116 Intervertebral disc disorders with radiculopathy, lumbar region: Secondary | ICD-10-CM | POA: Diagnosis not present

## 2019-02-22 DIAGNOSIS — M5416 Radiculopathy, lumbar region: Secondary | ICD-10-CM | POA: Diagnosis not present

## 2019-02-22 DIAGNOSIS — M419 Scoliosis, unspecified: Secondary | ICD-10-CM | POA: Diagnosis present

## 2019-02-22 DIAGNOSIS — M545 Low back pain: Secondary | ICD-10-CM | POA: Diagnosis present

## 2019-02-22 DIAGNOSIS — Z981 Arthrodesis status: Secondary | ICD-10-CM | POA: Diagnosis not present

## 2019-02-22 DIAGNOSIS — I739 Peripheral vascular disease, unspecified: Secondary | ICD-10-CM | POA: Diagnosis not present

## 2019-02-22 DIAGNOSIS — Z419 Encounter for procedure for purposes other than remedying health state, unspecified: Secondary | ICD-10-CM

## 2019-02-22 DIAGNOSIS — M4807 Spinal stenosis, lumbosacral region: Secondary | ICD-10-CM | POA: Diagnosis present

## 2019-02-22 DIAGNOSIS — M5137 Other intervertebral disc degeneration, lumbosacral region: Secondary | ICD-10-CM | POA: Diagnosis not present

## 2019-02-22 DIAGNOSIS — M48061 Spinal stenosis, lumbar region without neurogenic claudication: Principal | ICD-10-CM | POA: Diagnosis present

## 2019-02-22 DIAGNOSIS — N400 Enlarged prostate without lower urinary tract symptoms: Secondary | ICD-10-CM | POA: Diagnosis present

## 2019-02-22 DIAGNOSIS — I10 Essential (primary) hypertension: Secondary | ICD-10-CM | POA: Diagnosis not present

## 2019-02-22 DIAGNOSIS — M4126 Other idiopathic scoliosis, lumbar region: Secondary | ICD-10-CM | POA: Diagnosis present

## 2019-02-22 DIAGNOSIS — M4727 Other spondylosis with radiculopathy, lumbosacral region: Secondary | ICD-10-CM | POA: Diagnosis not present

## 2019-02-22 DIAGNOSIS — I862 Pelvic varices: Secondary | ICD-10-CM | POA: Diagnosis not present

## 2019-02-22 DIAGNOSIS — M4187 Other forms of scoliosis, lumbosacral region: Secondary | ICD-10-CM | POA: Diagnosis not present

## 2019-02-22 DIAGNOSIS — M5417 Radiculopathy, lumbosacral region: Secondary | ICD-10-CM | POA: Diagnosis not present

## 2019-02-22 HISTORY — PX: ABDOMINAL EXPOSURE: SHX5708

## 2019-02-22 HISTORY — PX: ANTERIOR LUMBAR FUSION: SHX1170

## 2019-02-22 SURGERY — ANTERIOR LUMBAR FUSION 1 LEVEL
Anesthesia: General | Site: Abdomen

## 2019-02-22 MED ORDER — POLYETHYLENE GLYCOL 3350 17 G PO PACK: 17.0000 g | PACK | Freq: Every day | ORAL | Status: DC | PRN

## 2019-02-22 MED ORDER — FLEET ENEMA 7-19 GM/118ML RE ENEM: 1.0000 | ENEMA | Freq: Once | RECTAL | Status: DC | PRN

## 2019-02-22 MED ORDER — ONDANSETRON HCL 4 MG PO TABS: 4.0000 mg | ORAL_TABLET | Freq: Four times a day (QID) | ORAL | Status: DC | PRN

## 2019-02-22 MED ORDER — FENTANYL CITRATE (PF) 100 MCG/2ML IJ SOLN
INTRAMUSCULAR | Status: DC | PRN
  Administered 2019-02-22 (×4): 50 ug via INTRAVENOUS

## 2019-02-22 MED ORDER — VITAMIN E 180 MG (400 UNIT) PO CAPS
400.0000 [IU] | ORAL_CAPSULE | Freq: Every day | ORAL | Status: DC
  Administered 2019-02-22: 14:00:00 400 [IU] via ORAL
  Filled 2019-02-22 (×2): qty 1

## 2019-02-22 MED ORDER — FENTANYL CITRATE (PF) 100 MCG/2ML IJ SOLN
INTRAMUSCULAR | Status: AC
  Filled 2019-02-22: qty 2

## 2019-02-22 MED ORDER — LIDOCAINE 2% (20 MG/ML) 5 ML SYRINGE
INTRAMUSCULAR | Status: DC | PRN
  Administered 2019-02-22: 100 mg via INTRAVENOUS

## 2019-02-22 MED ORDER — ROCURONIUM BROMIDE 10 MG/ML (PF) SYRINGE
PREFILLED_SYRINGE | INTRAVENOUS | Status: AC
  Filled 2019-02-22: qty 20

## 2019-02-22 MED ORDER — SODIUM CHLORIDE 0.9% FLUSH: 3.0000 mL | INTRAVENOUS | Status: DC | PRN

## 2019-02-22 MED ORDER — DEXAMETHASONE SODIUM PHOSPHATE 10 MG/ML IJ SOLN
10.0000 mg | Freq: Once | INTRAMUSCULAR | Status: AC
  Administered 2019-02-22: 11:00:00 10 mg via INTRAVENOUS

## 2019-02-22 MED ORDER — BUPROPION HCL ER (SR) 100 MG PO TB12
100.0000 mg | ORAL_TABLET | Freq: Every day | ORAL | Status: DC
  Filled 2019-02-22: qty 1

## 2019-02-22 MED ORDER — OXYCODONE HCL 5 MG PO TABS: 5.0000 mg | ORAL_TABLET | ORAL | Status: DC | PRN

## 2019-02-22 MED ORDER — ONDANSETRON HCL 4 MG/2ML IJ SOLN: 4.0000 mg | Freq: Four times a day (QID) | INTRAMUSCULAR | Status: DC | PRN

## 2019-02-22 MED ORDER — METHOCARBAMOL 1000 MG/10ML IJ SOLN
500.0000 mg | Freq: Four times a day (QID) | INTRAVENOUS | Status: DC | PRN
  Filled 2019-02-22: qty 5

## 2019-02-22 MED ORDER — ALUM & MAG HYDROXIDE-SIMETH 200-200-20 MG/5ML PO SUSP: 30.0000 mL | Freq: Four times a day (QID) | ORAL | Status: DC | PRN

## 2019-02-22 MED ORDER — SCOPOLAMINE 1 MG/3DAYS TD PT72
1.0000 | MEDICATED_PATCH | TRANSDERMAL | Status: DC
  Administered 2019-02-22: 06:00:00 1.5 mg via TRANSDERMAL
  Filled 2019-02-22: qty 1

## 2019-02-22 MED ORDER — MORPHINE SULFATE (PF) 2 MG/ML IV SOLN
2.0000 mg | INTRAVENOUS | Status: DC | PRN
  Administered 2019-02-22: 18:00:00 2 mg via INTRAVENOUS
  Filled 2019-02-22: qty 1

## 2019-02-22 MED ORDER — DEXAMETHASONE SODIUM PHOSPHATE 10 MG/ML IJ SOLN
INTRAMUSCULAR | Status: AC
  Filled 2019-02-22: qty 1

## 2019-02-22 MED ORDER — OXYCODONE HCL 5 MG PO TABS
5.0000 mg | ORAL_TABLET | ORAL | Status: DC | PRN
  Administered 2019-02-22: 11:00:00 5 mg via ORAL

## 2019-02-22 MED ORDER — EPHEDRINE SULFATE-NACL 50-0.9 MG/10ML-% IV SOSY
PREFILLED_SYRINGE | INTRAVENOUS | Status: DC | PRN
  Administered 2019-02-22: 5 mg via INTRAVENOUS
  Administered 2019-02-22: 10 mg via INTRAVENOUS
  Administered 2019-02-22 (×2): 5 mg via INTRAVENOUS

## 2019-02-22 MED ORDER — FERROUS SULFATE 325 (65 FE) MG PO TABS: 325.0000 mg | ORAL_TABLET | Freq: Every day | ORAL | Status: DC

## 2019-02-22 MED ORDER — PROPOFOL 10 MG/ML IV BOLUS
INTRAVENOUS | Status: AC
  Filled 2019-02-22: qty 20

## 2019-02-22 MED ORDER — GLYCOPYRROLATE PF 0.2 MG/ML IJ SOSY
PREFILLED_SYRINGE | INTRAMUSCULAR | Status: DC | PRN
  Administered 2019-02-22: .2 mg via INTRAVENOUS

## 2019-02-22 MED ORDER — ADULT MULTIVITAMIN W/MINERALS CH
1.0000 | ORAL_TABLET | Freq: Every day | ORAL | Status: DC
  Administered 2019-02-22: 14:00:00 1 via ORAL
  Filled 2019-02-22: qty 1

## 2019-02-22 MED ORDER — MIDAZOLAM HCL 2 MG/2ML IJ SOLN
INTRAMUSCULAR | Status: AC
  Filled 2019-02-22: qty 2

## 2019-02-22 MED ORDER — ROCURONIUM BROMIDE 10 MG/ML (PF) SYRINGE
PREFILLED_SYRINGE | INTRAVENOUS | Status: AC
  Filled 2019-02-22: qty 10

## 2019-02-22 MED ORDER — THROMBIN 5000 UNITS EX SOLR
OROMUCOSAL | Status: DC | PRN
  Administered 2019-02-22: 09:00:00 5 mL via TOPICAL

## 2019-02-22 MED ORDER — SODIUM CHLORIDE 0.9% FLUSH
3.0000 mL | Freq: Two times a day (BID) | INTRAVENOUS | Status: DC
  Administered 2019-02-22: 3 mL via INTRAVENOUS

## 2019-02-22 MED ORDER — METHOCARBAMOL 500 MG PO TABS
500.0000 mg | ORAL_TABLET | Freq: Four times a day (QID) | ORAL | Status: DC | PRN
  Administered 2019-02-22: 11:00:00 500 mg via ORAL

## 2019-02-22 MED ORDER — FENTANYL CITRATE (PF) 250 MCG/5ML IJ SOLN
INTRAMUSCULAR | Status: AC
  Filled 2019-02-22: qty 5

## 2019-02-22 MED ORDER — KCL IN DEXTROSE-NACL 20-5-0.45 MEQ/L-%-% IV SOLN
INTRAVENOUS | Status: AC
  Filled 2019-02-22: qty 1000

## 2019-02-22 MED ORDER — TAMSULOSIN HCL 0.4 MG PO CAPS
0.4000 mg | ORAL_CAPSULE | Freq: Every day | ORAL | Status: DC
  Filled 2019-02-22: qty 1

## 2019-02-22 MED ORDER — CO Q 10 100 MG PO CAPS: 200.0000 mg | ORAL_CAPSULE | Freq: Every day | ORAL | Status: DC

## 2019-02-22 MED ORDER — CHLORHEXIDINE GLUCONATE 4 % EX LIQD: 60.0000 mL | Freq: Once | CUTANEOUS | Status: DC

## 2019-02-22 MED ORDER — THROMBIN 5000 UNITS EX SOLR
CUTANEOUS | Status: AC
  Filled 2019-02-22: qty 10000

## 2019-02-22 MED ORDER — HEMOSTATIC AGENTS (NO CHARGE) OPTIME
TOPICAL | Status: DC | PRN
  Administered 2019-02-22: 1 via TOPICAL

## 2019-02-22 MED ORDER — CEFAZOLIN SODIUM-DEXTROSE 2-4 GM/100ML-% IV SOLN
2.0000 g | Freq: Three times a day (TID) | INTRAVENOUS | Status: AC
  Administered 2019-02-22 (×2): 2 g via INTRAVENOUS
  Filled 2019-02-22 (×2): qty 100

## 2019-02-22 MED ORDER — LACTATED RINGERS IV SOLN
INTRAVENOUS | Status: DC | PRN
  Administered 2019-02-22: 07:00:00 via INTRAVENOUS

## 2019-02-22 MED ORDER — HYDROCODONE-ACETAMINOPHEN 5-325 MG PO TABS
2.0000 | ORAL_TABLET | ORAL | Status: DC | PRN
  Administered 2019-02-22: 14:00:00 2 via ORAL
  Filled 2019-02-22: qty 2

## 2019-02-22 MED ORDER — ONDANSETRON HCL 4 MG/2ML IJ SOLN
INTRAMUSCULAR | Status: AC
  Filled 2019-02-22: qty 2

## 2019-02-22 MED ORDER — HYDROXYZINE HCL 50 MG/ML IM SOLN: 50.0000 mg | Freq: Four times a day (QID) | INTRAMUSCULAR | Status: DC | PRN

## 2019-02-22 MED ORDER — KCL IN DEXTROSE-NACL 20-5-0.45 MEQ/L-%-% IV SOLN
INTRAVENOUS | Status: DC
  Administered 2019-02-22: 12:00:00 via INTRAVENOUS

## 2019-02-22 MED ORDER — METHOCARBAMOL 500 MG PO TABS
ORAL_TABLET | ORAL | Status: AC
  Filled 2019-02-22: qty 1

## 2019-02-22 MED ORDER — DEXAMETHASONE SODIUM PHOSPHATE 10 MG/ML IJ SOLN
INTRAMUSCULAR | Status: DC | PRN
  Administered 2019-02-22: 10 mg via INTRAVENOUS

## 2019-02-22 MED ORDER — CHLORHEXIDINE GLUCONATE CLOTH 2 % EX PADS: 6.0000 | MEDICATED_PAD | Freq: Once | CUTANEOUS | Status: DC

## 2019-02-22 MED ORDER — CEFAZOLIN SODIUM-DEXTROSE 2-4 GM/100ML-% IV SOLN
INTRAVENOUS | Status: AC
  Filled 2019-02-22: qty 100

## 2019-02-22 MED ORDER — ONDANSETRON HCL 4 MG/2ML IJ SOLN
INTRAMUSCULAR | Status: DC | PRN
  Administered 2019-02-22: 4 mg via INTRAVENOUS

## 2019-02-22 MED ORDER — ZOLPIDEM TARTRATE 5 MG PO TABS: 5.0000 mg | ORAL_TABLET | Freq: Every evening | ORAL | Status: DC | PRN

## 2019-02-22 MED ORDER — ACETAMINOPHEN 325 MG PO TABS: 650.0000 mg | ORAL_TABLET | ORAL | Status: DC | PRN

## 2019-02-22 MED ORDER — SODIUM CHLORIDE 0.9 % IV SOLN: 250.0000 mL | INTRAVENOUS | Status: DC

## 2019-02-22 MED ORDER — OXYCODONE HCL 5 MG PO TABS
ORAL_TABLET | ORAL | Status: AC
  Filled 2019-02-22: qty 1

## 2019-02-22 MED ORDER — DOCUSATE SODIUM 100 MG PO CAPS
100.0000 mg | ORAL_CAPSULE | Freq: Two times a day (BID) | ORAL | Status: DC
  Administered 2019-02-22 (×2): 100 mg via ORAL
  Filled 2019-02-22 (×2): qty 1

## 2019-02-22 MED ORDER — ACETAMINOPHEN 500 MG PO TABS
1000.0000 mg | ORAL_TABLET | Freq: Once | ORAL | Status: AC
  Administered 2019-02-22: 06:00:00 1000 mg via ORAL
  Filled 2019-02-22: qty 2

## 2019-02-22 MED ORDER — GLYCOPYRROLATE PF 0.2 MG/ML IJ SOSY
PREFILLED_SYRINGE | INTRAMUSCULAR | Status: AC
  Filled 2019-02-22: qty 1

## 2019-02-22 MED ORDER — 0.9 % SODIUM CHLORIDE (POUR BTL) OPTIME
TOPICAL | Status: DC | PRN
  Administered 2019-02-22: 09:00:00 1000 mL

## 2019-02-22 MED ORDER — ROCURONIUM BROMIDE 10 MG/ML (PF) SYRINGE
PREFILLED_SYRINGE | INTRAVENOUS | Status: DC | PRN
  Administered 2019-02-22: 20 mg via INTRAVENOUS
  Administered 2019-02-22: 100 mg via INTRAVENOUS

## 2019-02-22 MED ORDER — MENTHOL 3 MG MT LOZG: 1.0000 | LOZENGE | OROMUCOSAL | Status: DC | PRN

## 2019-02-22 MED ORDER — PHENOL 1.4 % MT LIQD: 1.0000 | OROMUCOSAL | Status: DC | PRN

## 2019-02-22 MED ORDER — LIDOCAINE 2% (20 MG/ML) 5 ML SYRINGE
INTRAMUSCULAR | Status: AC
  Filled 2019-02-22: qty 5

## 2019-02-22 MED ORDER — EPHEDRINE 5 MG/ML INJ
INTRAVENOUS | Status: AC
  Filled 2019-02-22: qty 10

## 2019-02-22 MED ORDER — FENTANYL CITRATE (PF) 100 MCG/2ML IJ SOLN
25.0000 ug | INTRAMUSCULAR | Status: DC | PRN
  Administered 2019-02-22: 11:00:00 50 ug via INTRAVENOUS

## 2019-02-22 MED ORDER — PROPOFOL 10 MG/ML IV BOLUS
INTRAVENOUS | Status: DC | PRN
  Administered 2019-02-22: 200 mg via INTRAVENOUS

## 2019-02-22 MED ORDER — BISACODYL 10 MG RE SUPP: 10.0000 mg | Freq: Every day | RECTAL | Status: DC | PRN

## 2019-02-22 MED ORDER — PANTOPRAZOLE SODIUM 40 MG PO TBEC
40.0000 mg | DELAYED_RELEASE_TABLET | Freq: Every day | ORAL | Status: DC
  Administered 2019-02-22: 21:00:00 40 mg via ORAL
  Filled 2019-02-22: qty 1

## 2019-02-22 MED ORDER — THROMBIN 5000 UNITS EX SOLR
CUTANEOUS | Status: DC | PRN
  Administered 2019-02-22 (×2): 5000 [IU] via TOPICAL

## 2019-02-22 MED ORDER — ACETAMINOPHEN 650 MG RE SUPP: 650.0000 mg | RECTAL | Status: DC | PRN

## 2019-02-22 MED ORDER — SUGAMMADEX SODIUM 200 MG/2ML IV SOLN
INTRAVENOUS | Status: DC | PRN
  Administered 2019-02-22: 200 mg via INTRAVENOUS

## 2019-02-22 MED ORDER — PROMETHAZINE HCL 25 MG/ML IJ SOLN: 6.2500 mg | INTRAMUSCULAR | Status: DC | PRN

## 2019-02-22 MED ORDER — VITAMIN D 25 MCG (1000 UNIT) PO TABS
1000.0000 [IU] | ORAL_TABLET | Freq: Every day | ORAL | Status: DC
  Administered 2019-02-22: 14:00:00 1000 [IU] via ORAL
  Filled 2019-02-22: qty 1

## 2019-02-22 MED ORDER — PANTOPRAZOLE SODIUM 40 MG IV SOLR: 40.0000 mg | Freq: Every day | INTRAVENOUS | Status: DC

## 2019-02-22 MED ORDER — TRAMADOL HCL 50 MG PO TABS: 50.0000 mg | ORAL_TABLET | Freq: Four times a day (QID) | ORAL | Status: DC | PRN

## 2019-02-22 MED ORDER — CEFAZOLIN SODIUM-DEXTROSE 2-4 GM/100ML-% IV SOLN
2.0000 g | INTRAVENOUS | Status: AC
  Administered 2019-02-22: 2 g via INTRAVENOUS

## 2019-02-22 MED ORDER — SODIUM CHLORIDE 0.9 % IV SOLN
INTRAVENOUS | Status: DC | PRN
  Administered 2019-02-22: 25 ug/min via INTRAVENOUS

## 2019-02-22 MED ORDER — THROMBIN 5000 UNITS EX SOLR
CUTANEOUS | Status: AC
  Filled 2019-02-22: qty 5000

## 2019-02-22 SURGICAL SUPPLY — 75 items
ADH SKN CLS APL DERMABOND .7 (GAUZE/BANDAGES/DRESSINGS) ×1
APPLIER CLIP 11 MED OPEN (CLIP) ×2
BASKET BONE COLLECTION (BASKET) ×1 IMPLANT
BOLT BASE TI 5X20 VARIABLE (Bolt) ×3 IMPLANT
CANISTER SUCT 3000ML PPV (MISCELLANEOUS) ×2 IMPLANT
CLIP APPLIE 11 MED OPEN (CLIP) ×2 IMPLANT
CLIP LIGATING EXTRA MED SLVR (CLIP) ×2 IMPLANT
CLIP LIGATING EXTRA SM BLUE (MISCELLANEOUS) ×2 IMPLANT
COVER BACK TABLE 60X90IN (DRAPES) ×2 IMPLANT
DERMABOND ADVANCED (GAUZE/BANDAGES/DRESSINGS) ×1
DERMABOND ADVANCED .7 DNX12 (GAUZE/BANDAGES/DRESSINGS) ×2 IMPLANT
DRAPE C-ARM 42X72 X-RAY (DRAPES) ×2 IMPLANT
DRAPE C-ARMOR (DRAPES) ×2 IMPLANT
DRAPE LAPAROTOMY 100X72X124 (DRAPES) ×2 IMPLANT
DRSG OPSITE POSTOP 4X6 (GAUZE/BANDAGES/DRESSINGS) ×1 IMPLANT
DURAPREP 26ML APPLICATOR (WOUND CARE) ×2 IMPLANT
ELECT BLADE 4.0 EZ CLEAN MEGAD (MISCELLANEOUS) ×2
ELECT REM PT RETURN 9FT ADLT (ELECTROSURGICAL) ×2
ELECTRODE BLDE 4.0 EZ CLN MEGD (MISCELLANEOUS) ×1 IMPLANT
ELECTRODE REM PT RTRN 9FT ADLT (ELECTROSURGICAL) ×1 IMPLANT
GAUZE 4X4 16PLY RFD (DISPOSABLE) IMPLANT
GAUZE SPONGE 4X4 12PLY STRL (GAUZE/BANDAGES/DRESSINGS) IMPLANT
GLOVE BIO SURGEON STRL SZ8 (GLOVE) ×2 IMPLANT
GLOVE BIOGEL PI IND STRL 7.5 (GLOVE) IMPLANT
GLOVE BIOGEL PI IND STRL 8 (GLOVE) ×1 IMPLANT
GLOVE BIOGEL PI IND STRL 8.5 (GLOVE) ×1 IMPLANT
GLOVE BIOGEL PI INDICATOR 7.5 (GLOVE) ×2
GLOVE BIOGEL PI INDICATOR 8 (GLOVE) ×4
GLOVE BIOGEL PI INDICATOR 8.5 (GLOVE) ×1
GLOVE ECLIPSE 7.5 STRL STRAW (GLOVE) ×4 IMPLANT
GLOVE ECLIPSE 8.0 STRL XLNG CF (GLOVE) ×3 IMPLANT
GLOVE SS BIOGEL STRL SZ 7.5 (GLOVE) ×1 IMPLANT
GLOVE SUPERSENSE BIOGEL SZ 7.5 (GLOVE) ×1
GOWN STRL REUS W/ TWL LRG LVL3 (GOWN DISPOSABLE) ×1 IMPLANT
GOWN STRL REUS W/ TWL XL LVL3 (GOWN DISPOSABLE) ×1 IMPLANT
GOWN STRL REUS W/TWL 2XL LVL3 (GOWN DISPOSABLE) ×4 IMPLANT
GOWN STRL REUS W/TWL LRG LVL3 (GOWN DISPOSABLE) ×2
GOWN STRL REUS W/TWL XL LVL3 (GOWN DISPOSABLE) ×1
HEMOSTAT POWDER KIT SURGIFOAM (HEMOSTASIS) ×1 IMPLANT
HEMOSTAT POWDER SURGIFOAM 1G (HEMOSTASIS) ×1 IMPLANT
IMPL BASE TI 8X42X30MM-15 (Neuro Prosthesis/Implant) IMPLANT
IMPLANT BASE TI 8X42X30MM-15 (Neuro Prosthesis/Implant) ×2 IMPLANT
INSERT FOGARTY 61MM (MISCELLANEOUS) IMPLANT
INSERT FOGARTY SM (MISCELLANEOUS) IMPLANT
KIT BASIN OR (CUSTOM PROCEDURE TRAY) ×2 IMPLANT
KIT INFUSE XX SMALL 0.7CC (Orthopedic Implant) ×1 IMPLANT
KIT TURNOVER KIT B (KITS) ×2 IMPLANT
LOOP VESSEL MAXI BLUE (MISCELLANEOUS) ×1 IMPLANT
LOOP VESSEL MINI RED (MISCELLANEOUS) ×1 IMPLANT
NDL HYPO 25X1 1.5 SAFETY (NEEDLE) IMPLANT
NDL SPNL 18GX3.5 QUINCKE PK (NEEDLE) ×1 IMPLANT
NEEDLE HYPO 25X1 1.5 SAFETY (NEEDLE) IMPLANT
NEEDLE SPNL 18GX3.5 QUINCKE PK (NEEDLE) ×2 IMPLANT
NS IRRIG 1000ML POUR BTL (IV SOLUTION) ×2 IMPLANT
PACK LAMINECTOMY NEURO (CUSTOM PROCEDURE TRAY) ×2 IMPLANT
PUTTY BONE ATTRAX 10CC STRIP (Putty) ×1 IMPLANT
SPONGE INTESTINAL PEANUT (DISPOSABLE) ×3 IMPLANT
SPONGE LAP 18X18 RF (DISPOSABLE) ×2 IMPLANT
SPONGE LAP 4X18 RFD (DISPOSABLE) IMPLANT
SPONGE SURGIFOAM ABS GEL SZ50 (HEMOSTASIS) ×2 IMPLANT
STAPLER VISISTAT 35W (STAPLE) ×1 IMPLANT
SUT PDS AB 1 CTX 36 (SUTURE) ×2 IMPLANT
SUT SILK 0 TIES 10X30 (SUTURE) ×2 IMPLANT
SUT SILK 2 0 TIES 10X30 (SUTURE) ×2 IMPLANT
SUT VIC AB 2-0 CT1 18 (SUTURE) ×2 IMPLANT
SUT VIC AB 2-0 CT1 36 (SUTURE) ×2 IMPLANT
SUT VIC AB 2-0 CTX 36 (SUTURE) IMPLANT
SUT VIC AB 3-0 SH 27 (SUTURE) ×1
SUT VIC AB 3-0 SH 27X BRD (SUTURE) ×1 IMPLANT
SUT VIC AB 3-0 SH 8-18 (SUTURE) ×2 IMPLANT
SUT VICRYL 4-0 PS2 18IN ABS (SUTURE) IMPLANT
TOWEL GREEN STERILE (TOWEL DISPOSABLE) ×4 IMPLANT
TOWEL GREEN STERILE FF (TOWEL DISPOSABLE) ×2 IMPLANT
TRAY FOLEY MTR SLVR 16FR STAT (SET/KITS/TRAYS/PACK) ×2 IMPLANT
WATER STERILE IRR 1000ML POUR (IV SOLUTION) ×2 IMPLANT

## 2019-02-22 NOTE — Transfer of Care (Signed)
Immediate Anesthesia Transfer of Care Note  Patient: [name].  Procedure(s) Performed: Lumbar Five to Sacral One Anterior Lumbar Interbody Fusion (N/A Abdomen) ABDOMINAL EXPOSURE (N/A Abdomen)  Patient Location: PACU  Anesthesia Type:General  Level of Consciousness: awake, alert  and oriented  Airway & Oxygen Therapy: Patient Spontanous Breathing and Patient connected to nasal cannula oxygen  Post-op Assessment: Report given to RN, Post -op Vital signs reviewed and stable and Patient moving all extremities X 4  Post vital signs: Reviewed and stable  Last Vitals:  Vitals Value Taken Time  BP 99/54 02/22/19 1026  Temp    Pulse 69 02/22/19 1028  Resp 15 02/22/19 1028  SpO2 99 % 02/22/19 1028  Vitals shown include unvalidated device data.  Last Pain:  Vitals:   02/22/19 0615  PainSc: 0-No pain         Complications: No apparent anesthesia complications

## 2019-02-22 NOTE — Anesthesia Procedure Notes (Signed)
Procedure Name: Intubation Date/Time: 02/22/2019 7:44 AM Performed by: Kyung Rudd, CRNA Pre-anesthesia Checklist: Patient identified, Emergency Drugs available, Suction available, Patient being monitored and Timeout performed Patient Re-evaluated:Patient Re-evaluated prior to induction Oxygen Delivery Method: Circle system utilized Preoxygenation: Pre-oxygenation with 100% oxygen Induction Type: IV induction Ventilation: Mask ventilation without difficulty Laryngoscope Size: Mac and 4 Grade View: Grade I Tube type: Oral Tube size: 7.5 mm Number of attempts: 1 Airway Equipment and Method: Stylet Placement Confirmation: ETT inserted through vocal cords under direct vision,  positive ETCO2 and breath sounds checked- equal and bilateral Secured at: 20 cm Tube secured with: Tape Dental Injury: Teeth and Oropharynx as per pre-operative assessment

## 2019-02-22 NOTE — Op Note (Signed)
02/22/2019  10:14 AM  PATIENT:  [name].  73 y.o. male  PRE-OPERATIVE DIAGNOSIS:  Lumbar foraminal stenosis, lumbar scoliosis, lumbago, radiculopathy  POST-OPERATIVE DIAGNOSIS:  Lumbar foraminal stenosis, lumbar scoliosis, lumbago, radiculopathy  PROCEDURE:  Procedure(s) with comments: Lumbar Five to Sacral One Anterior Lumbar Interbody Fusion (N/A) - Lumbar 5 to Sacral 1 Anterior lumbar interbody fusion ABDOMINAL EXPOSURE (N/A)  SURGEON:  Surgeon(s) and Role: Panel 1:    Erline Levine, MD - Primary Panel 2:    * Early, Arvilla Meres, MD - Primary  PHYSICIAN ASSISTANT:   ASSISTANTS: [place], RN   ANESTHESIA:   general  EBL:  100 mL   BLOOD ADMINISTERED:none  DRAINS: none   LOCAL MEDICATIONS USED:  MARCAINE    and LIDOCAINE   SPECIMEN:  No Specimen  DISPOSITION OF SPECIMEN:  N/A  COUNTS:  YES  TOURNIQUET:  * No tourniquets in log *  DICTATION:   INDICATIONS:  [name] is 73 year old male with scoliosis, right leg weakness and foraminal stenosis at the  and spondylosis at the L 5 S 1 level.  It was elected to take him to surgery for anterior lumbar decompression and fusion at the L 5 S1 level.  PROCEDURE:  Doctor Early performed exposure and his portion of the procedure will be dictated separately.  Upon exposing the L 5 S1 level, a localizing X ray was obtained with the C arm.  I then incised the anterior annulus and performed a thorough discectomy.  The endplates were cleared of disc and cartilagenous material and a thorough discectomy was performed with decompression of the ventral annulus and disc material.  After trial, a 15 degree lordotic x 8 x42 x30 mm Nuvasive Base titanium spacer was selected, packed with extra extra small BMP and Attrax graft extender.  The implant was tamped into position and positioning was confirmed with C arm.  Interfixated screws were placed (two in S 1 and one in L 5 ) all 5.0 x 20 mm in length.    Locking mechanisms were engaged, soft  tissues were inspected and found to be in good repair.  Retractors were removed.  Fascia was closed with 1 PDS running stitch, skin edges closed with 2-0 and 3-0 vicryl sutures.  Wound was dressed with a sterile occlusive dressing.  Patient was extubated in the OR and taken to recovery having tolerated his surgery well.  Counts were correct.   PLAN OF CARE: Admit to inpatient   PATIENT DISPOSITION:  PACU - hemodynamically stable.   Delay start of Pharmacological VTE agent (>24hrs) due to surgical blood loss or risk of bleeding: yes

## 2019-02-22 NOTE — Progress Notes (Signed)
Awake, alert, conversant.  MAEW with good strength.  Doing well. 
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
## 2019-02-22 NOTE — Interval H&P Note (Signed)
History and Physical Interval Note:  02/22/2019 7:28 AM  [name].  has presented today for surgery, with the diagnosis of Lumbar foraminal stenosis.  The various methods of treatment have been discussed with the patient and family. After consideration of risks, benefits and other options for treatment, the patient has consented to  Procedure(s) with comments: Lumbar 5 to Sacral 1 Anterior lumbar interbody fusion (N/A) - Lumbar 5 to Sacral 1 Anterior lumbar interbody fusion ABDOMINAL EXPOSURE (N/A) as a surgical intervention.  The patient's history has been reviewed, patient examined, no change in status, stable for surgery.  I have reviewed the patient's chart and labs.  Questions were answered to the patient's satisfaction.     Peggyann Shoals

## 2019-02-22 NOTE — Anesthesia Procedure Notes (Signed)
Arterial Line Insertion Start/End8/09/2018 7:22 AM, 02/22/2019 7:32 AM Performed by: Kyung Rudd, CRNA, CRNA  Patient location: Pre-op. Preanesthetic checklist: patient identified, IV checked, site marked, risks and benefits discussed, surgical consent, monitors and equipment checked, pre-op evaluation and timeout performed Lidocaine 1% used for infiltration and patient sedated Right, radial was placed Catheter size: 20 G Hand hygiene performed , maximum sterile barriers used  and Seldinger technique used Allen's test indicative of satisfactory collateral circulation Attempts: 2 Procedure performed without using ultrasound guided technique. Following insertion, Biopatch and dressing applied. Post procedure assessment: normal  Patient tolerated the procedure well with no immediate complications.

## 2019-02-22 NOTE — Op Note (Signed)
    OPERATIVE REPORT  DATE OF SURGERY: 02/22/2019  PATIENT: [name]., 73 y.o. male MRN: [id number]  DOB: 1946-07-14  PRE-OPERATIVE DIAGNOSIS: Degenerative disc disease  POST-OPERATIVE DIAGNOSIS:  Same  PROCEDURE: Anterior exposure for L5-S1 disc surgery  SURGEON:  Curt Jews, M.D.  Co-surgeon for the exposure Dr. Dierdre Harness  PHYSICIAN ASSISTANT: Humberto Seals, RN  ANESTHESIA: General  EBL: per anesthesia record  Total I/O In: 8937 [P.O.:80; I.V.:1400; Other:200] Out: 225 [Urine:125; Blood:100]  BLOOD ADMINISTERED: none  DRAINS: none  SPECIMEN: none  COUNTS CORRECT:  YES  PATIENT DISPOSITION:  PACU - hemodynamically stable  PROCEDURE DETAILS: Patient was taken the operating placed supine position where the area of the abdomen was prepped draped in sterile fashion.  C-arm was brought into the field and the area of the L5-S1 disc was identified on the skin and marked.  Incision was made from the midline laterally and carried down through the subcutaneous fat with electrocautery.  The anterior rectus sheath was opened in line with the skin incision.  The rectus muscle was mobilized medially and the retroperitoneum was entered bluntly.  Intraperitoneal contents were mobilized to the right.  Blunt dissection was used to mobilize the intraperitoneal contents and the posterior sheath was opened laterally.  The left ureter was identified and retracted to the right.  The self-retaining retractor was brought onto the field and the reverse lip blades were positioned to the right and left of the L5-S1 disc.  Middle sacral vessels were clipped and divided.  Retractors were also placed superiorly and inferiorly.  This gave adequate exposure to the anterior surface of the L5-S1 disc.  A spinal needle was placed in the disc and C-arm was brought back onto the field concerned this was the appropriate level.  The remainder the procedure be dictated separate note by Dr. Terrall Laity, M.D., [facility] 02/22/2019 12:59 PM

## 2019-02-22 NOTE — Brief Op Note (Signed)
02/22/2019  10:14 AM  PATIENT:  [name].  73 y.o. male  PRE-OPERATIVE DIAGNOSIS:  Lumbar foraminal stenosis, lumbar scoliosis, lumbago, radiculopathy  POST-OPERATIVE DIAGNOSIS:  Lumbar foraminal stenosis, lumbar scoliosis, lumbago, radiculopathy  PROCEDURE:  Procedure(s) with comments: Lumbar Five to Sacral One Anterior Lumbar Interbody Fusion (N/A) - Lumbar 5 to Sacral 1 Anterior lumbar interbody fusion ABDOMINAL EXPOSURE (N/A)  SURGEON:  Surgeon(s) and Role: Panel 1:    Erline Levine, MD - Primary Panel 2:    * Early, Arvilla Meres, MD - Primary  PHYSICIAN ASSISTANT:   ASSISTANTS: Poteat, RN   ANESTHESIA:   general  EBL:  100 mL   BLOOD ADMINISTERED:none  DRAINS: none   LOCAL MEDICATIONS USED:  MARCAINE    and LIDOCAINE   SPECIMEN:  No Specimen  DISPOSITION OF SPECIMEN:  N/A  COUNTS:  YES  TOURNIQUET:  * No tourniquets in log *  DICTATION:   INDICATIONS:  [name] is 72 year old male with scoliosis, right leg weakness and foraminal stenosis at the  and spondylosis at the L 5 S 1 level.  It was elected to take him to surgery for anterior lumbar decompression and fusion at the L 5 S1 level.  PROCEDURE:  Doctor Early performed exposure and his portion of the procedure will be dictated separately.  Upon exposing the L 5 S1 level, a localizing X ray was obtained with the C arm.  I then incised the anterior annulus and performed a thorough discectomy.  The endplates were cleared of disc and cartilagenous material and a thorough discectomy was performed with decompression of the ventral annulus and disc material.  After trial, a 15 degree lordotic x 8 x42 x30 mm Nuvasive Base titanium spacer was selected, packed with extra extra small BMP and Attrax graft extender.  The implant was tamped into position and positioning was confirmed with C arm.  Interfixated screws were placed (two in S 1 and one in L 5 ) all 5.0 x 20 mm in length.    Locking mechanisms were engaged, soft  tissues were inspected and found to be in good repair.  Retractors were removed.  Fascia was closed with 1 PDS running stitch, skin edges closed with 2-0 and 3-0 vicryl sutures.  Wound was dressed with a sterile occlusive dressing.  Patient was extubated in the OR and taken to recovery having tolerated his surgery well.  Counts were correct.   PLAN OF CARE: Admit to inpatient   PATIENT DISPOSITION:  PACU - hemodynamically stable.   Delay start of Pharmacological VTE agent (>24hrs) due to surgical blood loss or risk of bleeding: yes

## 2019-02-22 NOTE — Progress Notes (Signed)
Pt c/o tingling/ numbness in RLE and foot.  He is able to bend RLE, strong plantar & dorsiflexion, able to feel me pinching toes. Dr Vertell Limber updated-new order for IV Decadron. Will continue to monitor.

## 2019-02-22 NOTE — Anesthesia Postprocedure Evaluation (Signed)
Anesthesia Post Note  Patient: [name].  Procedure(s) Performed: Lumbar Five to Sacral One Anterior Lumbar Interbody Fusion (N/A Abdomen) ABDOMINAL EXPOSURE (N/A Abdomen)     Patient location during evaluation: PACU Anesthesia Type: General Level of consciousness: sedated Pain management: pain level controlled Vital Signs Assessment: post-procedure vital signs reviewed and stable Respiratory status: spontaneous breathing and respiratory function stable Cardiovascular status: stable Postop Assessment: no apparent nausea or vomiting Anesthetic complications: no    Last Vitals:  Vitals:   02/22/19 1111 02/22/19 1115  BP: 104/68   Pulse:  65  Resp:  16  Temp:    SpO2:  99%    Last Pain:  Vitals:   02/22/19 1115  PainSc: 3                  Elis Sauber DANIEL

## 2019-02-22 NOTE — Progress Notes (Signed)
PHARMACIST - PHYSICIAN ORDER COMMUNICATION  CONCERNING: P&T Medication Policy on Herbal Medications  DESCRIPTION:  This patient's order for Co Q-10 enzyme has been noted.  This product(s) is classified as an "herbal" or natural product. Due to a lack of definitive safety studies or FDA approval, nonstandard manufacturing practices, plus the potential risk of unknown drug-drug interactions while on inpatient medications, the Pharmacy and Therapeutics Committee does not permit the use of "herbal" or natural products of this type within [facility].   ACTION TAKEN: The pharmacy department is unable to verify this order at this time.  Please reevaluate patient's clinical condition at discharge and address if the herbal or natural product(s) should be resumed at that time.   Thank you for allowing pharmacy to be part of this patients care team. Nicole Cella, [place] Pharmacist [phone] Please check AMION for all [place] phone numbers After 10:00 PM, call [place] 02/22/2019 1:05 PM

## 2019-02-23 ENCOUNTER — Encounter (HOSPITAL_COMMUNITY): Payer: Self-pay | Admitting: Neurosurgery

## 2019-02-23 MED ORDER — METHOCARBAMOL 500 MG PO TABS: 500.0000 mg | ORAL_TABLET | Freq: Four times a day (QID) | ORAL | 1 refills | Status: DC | PRN

## 2019-02-23 MED ORDER — OXYCODONE-ACETAMINOPHEN 5-325 MG PO TABS: 1.0000 | ORAL_TABLET | ORAL | 0 refills | Status: DC | PRN

## 2019-02-23 MED FILL — Heparin Sodium (Porcine) Inj 1000 Unit/ML: INTRAMUSCULAR | Qty: 30 | Status: AC

## 2019-02-23 MED FILL — Sodium Chloride IV Soln 0.9%: INTRAVENOUS | Qty: 1000 | Status: AC

## 2019-02-23 NOTE — Evaluation (Signed)
Occupational Therapy Evaluation Patient Details Name: [name]. MRN: [id number] DOB: 06-Mar-1946 Today's Date: 02/23/2019    History of Present Illness Pt is a 73 y/o male s/p ALIF L5-S1. PMH: BPH, umbilical hernia repair, inguinal hernia repair.   Clinical Impression   PTA patient independent. Admitted for above and limited by problem list below, including precaution adherence, pain and impaired balance.  Patient currently requires supervision for transfers and in room mobility with cueing for posture and precautions, supervision for ADLs.  Patient educated on precautions, ADL compensatory techniques, DME and recommendations. Min cueing throughout session to adhere to precautions, reports spouse will be available to assist as needed at dc.  Will follow while admitted in order to maximize independence, safety and precaution adherence in regards to ADLs and mobility.     Follow Up Recommendations  No OT follow up;Supervision/Assistance - 24 hour    Equipment Recommendations  3 in 1 bedside commode    Recommendations for Other Services       Precautions / Restrictions Precautions Precautions: Back Precaution Booklet Issued: Yes (comment) Precaution Comments: reviewed precautions with pt, min cueing to adhere Required Braces or Orthoses: Spinal Brace Spinal Brace: Lumbar corset;Applied in sitting position Restrictions Weight Bearing Restrictions: No      Mobility Bed Mobility               General bed mobility comments: EOB upon entry, reviewed log roll technique  Transfers Overall transfer level: Needs assistance Equipment used: Rolling walker (2 wheeled) Transfers: Sit to/from Stand Sit to Stand: Supervision         General transfer comment: cueing for hand placement and posture, supervision for safety    Balance Overall balance assessment: Needs assistance Sitting-balance support: No upper extremity supported;Feet supported Sitting balance-Leahy Scale:  Good     Standing balance support: Bilateral upper extremity supported;During functional activity;No upper extremity supported Standing balance-Leahy Scale: Fair Standing balance comment: relaint on BUE support dyanmically, able to engage in static ADls without UE support                           ADL either performed or assessed with clinical judgement   ADL Overall ADL's : Needs assistance/impaired     Grooming: Supervision/safety;Standing   Upper Body Bathing: Supervision/ safety;Sitting   Lower Body Bathing: Supervison/ safety;Sit to/from stand;Cueing for back precautions;Cueing for compensatory techniques Lower Body Bathing Details (indicate cue type and reason): cueing for figure 4 technique and reviewed safety/technique seated Upper Body Dressing : Supervision/safety;Set up;Sitting Upper Body Dressing Details (indicate cue type and reason): cueing for back brace Lower Body Dressing: Set up;Supervision/safety;Sit to/from stand;Cueing for back precautions;Cueing for compensatory techniques Lower Body Dressing Details (indicate cue type and reason): cueing for figure 4 technique and precautions  Toilet Transfer: Supervision/safety;RW;Ambulation;Regular Toilet       Tub/ Shower Transfer: Tub transfer;Supervision/safety;Ambulation;3 in 1;Rolling walker Tub/Shower Transfer Details (indicate cue type and reason): reviewed reverse step transfer technique into tub using RW  Functional mobility during ADLs: Supervision/safety;Rolling walker General ADL Comments: pt educated on compensatory techniques for ADLs, back precautions, activity progression and safety/DME     Vision         Perception     Praxis      Pertinent Vitals/Pain Pain Assessment: Faces Faces Pain Scale: Hurts a little bit Pain Location: incisional Pain Descriptors / Indicators: Discomfort;Operative site guarding Pain Intervention(s): Monitored during session;Repositioned     Hand Dominance  Extremity/Trunk Assessment Upper Extremity Assessment Upper Extremity Assessment: Overall WFL for tasks assessed   Lower Extremity Assessment Lower Extremity Assessment: Defer to PT evaluation   Cervical / Trunk Assessment Cervical / Trunk Assessment: Other exceptions Cervical / Trunk Exceptions: s/p spinal surgery   Communication Communication Communication: No difficulties   Cognition Arousal/Alertness: Awake/alert Behavior During Therapy: WFL for tasks assessed/performed Overall Cognitive Status: Within Functional Limits for tasks assessed                                 General Comments: some STM deficits noted, appears baseline   General Comments       Exercises     Shoulder Instructions      Home Living Family/patient expects to be discharged to:: Private residence Living Arrangements: Spouse/significant other Available Help at Discharge: Family;Available 24 hours/day Type of Home: House Home Access: Stairs to enter [employer] of Steps: 1   Home Layout: One level     Bathroom Shower/Tub: Teacher, early years/pre: Standard     Home Equipment: Environmental consultant - 2 wheels;Cane - single point          Prior Functioning/Environment Level of Independence: Independent                 OT Problem List: Decreased activity tolerance;Impaired balance (sitting and/or standing);Decreased safety awareness;Decreased knowledge of use of DME or AE;Decreased knowledge of precautions;Pain      OT Treatment/Interventions: Self-care/ADL training;DME and/or AE instruction;Therapeutic activities;Patient/family education;Balance training    OT Goals(Current goals can be found in the care plan section) Acute Rehab OT Goals Patient Stated Goal: home today OT Goal Formulation: With patient Time For Goal Achievement: 03/09/19 Potential to Achieve Goals: Good  OT Frequency: Min 2X/week   Barriers to D/C:            Co-evaluation               AM-PAC OT "6 Clicks" Daily Activity     Outcome Measure Help from another person eating meals?: None Help from another person taking care of personal grooming?: None Help from another person toileting, which includes using toliet, bedpan, or urinal?: A Little Help from another person bathing (including washing, rinsing, drying)?: A Little Help from another person to put on and taking off regular upper body clothing?: None Help from another person to put on and taking off regular lower body clothing?: A Little 6 Click Score: 21   End of Session Equipment Utilized During Treatment: Rolling walker;Back brace Nurse Communication: Mobility status  Activity Tolerance: Patient tolerated treatment well Patient left: with call bell/phone within reach;Other (comment)(seated EOB)  OT Visit Diagnosis: Other abnormalities of gait and mobility (R26.89);Pain Pain - part of body: (incisional)                Time: 6237-6283 OT Time Calculation (min): 22 min Charges:  OT General Charges $OT Visit: 1 Visit OT Evaluation $OT Eval Moderate Complexity: 1 Mod  Delight Stare, OT Acute Rehabilitation Services Pager [phone] Office [phone]   Delight Stare 02/23/2019, 8:37 AM

## 2019-02-23 NOTE — Progress Notes (Signed)
Patient ID: Wesley [place]., male   DOB: February 20, 1946, 73 y.o.   MRN: [id number] Looks great this morning.  Preparing for discharge.  Had nausea immediately after surgery but none since.  No abdominal soreness.  No active vascular issues.  We will see him again on an as-needed basis

## 2019-02-23 NOTE — Discharge Instructions (Signed)

## 2019-02-23 NOTE — Plan of Care (Signed)
Pt doing well. Pt given D/C instructions with verbal understanding. Pt's incision is clean and dry with no sign of infection. Pt's IV was removed prior to D/C. Pt D/C'd home via wheelchair per MD order. Pt is stable @ D/C and has no other needs at this time. Holli Humbles, RN

## 2019-02-23 NOTE — Progress Notes (Addendum)
Subjective: Patient reports "I haven't hurt through the night, just some right thigh pain late yesterday"  Objective: Vital signs in last 24 hours: Temp:  [97.2 F (36.2 C)-99.3 F (37.4 C)] 97.4 F (36.3 C) (08/04 0542) Pulse Rate:  [55-91] 62 (08/04 0542) Resp:  [16-21] 18 (08/04 0542) BP: (99-122)/(54-76) 106/59 (08/04 0542) SpO2:  [94 %-100 %] 97 % (08/04 0542) Arterial Line BP: (110-128)/(46-54) 122/52 (08/03 1117) Weight:  [94.4 kg] 94.4 kg (08/03 1230)  Intake/Output from previous day: 08/03 0701 - 08/04 0700 In: 2263 [P.O.:560; I.V.:1403; IV Piggyback:100] Out: 225 [Urine:125; Blood:100] Intake/Output this shift: No intake/output data recorded.  Alert, conversant. MAEW with good strength. PReports no leg pain, no back pain. Passing gas. Belly soft and nontender. Incision without erythema, swelling, or drainage beneath honeycomb and Dermabond.   Lab Results: No results for input(s): WBC, HGB, HCT, PLT in the last 72 hours. BMET No results for input(s): NA, K, CL, CO2, GLUCOSE, BUN, CREATININE, CALCIUM in the last 72 hours.  Studies/Results: Dg Lumbar Spine 2-3 Views  Result Date: 02/22/2019 CLINICAL DATA:  L5-S1 anterior lumbar interbody fusion. EXAM: LUMBAR SPINE - 2-3 VIEW; DG C-ARM 61-120 MIN COMPARISON:  Radiographs 10/05/2018 FINDINGS: Fluoroscopic image demonstrates the placement of a L5-S1 anterior lumbar interbody spacer with fusion screws. Lateral radiograph demonstrates a surgical implements yearly. Frontal radiograph demonstrates several surgical clips but no unexpected foreign bodies. Persistent dextrocurvature of the lower lumbar spine is similar prior exams. IMPRESSION: Satisfactory appearance of L5-S1 anterior lumbar interbody fusion. Electronically Signed   By: Lovena Le M.D.   On: 02/22/2019 14:59   Dg C-arm 1-60 Min  Result Date: 02/22/2019 CLINICAL DATA:  L5-S1 anterior lumbar interbody fusion. EXAM: LUMBAR SPINE - 2-3 VIEW; DG C-ARM 61-120 MIN  COMPARISON:  Radiographs 10/05/2018 FINDINGS: Fluoroscopic image demonstrates the placement of a L5-S1 anterior lumbar interbody spacer with fusion screws. Lateral radiograph demonstrates a surgical implements yearly. Frontal radiograph demonstrates several surgical clips but no unexpected foreign bodies. Persistent dextrocurvature of the lower lumbar spine is similar prior exams. IMPRESSION: Satisfactory appearance of L5-S1 anterior lumbar interbody fusion. Electronically Signed   By: Lovena Le M.D.   On: 02/22/2019 14:59   Dg Or Local Abdomen  Result Date: 02/22/2019 CLINICAL DATA:  Surgery, elective.  Instrument count. EXAM: OR LOCAL ABDOMEN COMPARISON:  Lumbar spine radiographs 10/05/2018 FINDINGS: Lumbar fusion is noted at L5-S1. Multiple pelvic phleboliths are noted. No radiopaque surgical instruments are present. IMPRESSION: Lumbar fusion at L5-S1 without radiographic evidence for complication. Electronically Signed   By: San Morelle M.D.   On: 02/22/2019 10:05    Assessment/Plan: improving  LOS: 1 day  Per Dr. Vertell Limber, ok to d/c to home when cleared by PT this am. Pt verbalizes understanding of d/c instructions and will call office to schedule 3-4 week f/u visit. Percocet 5/325 and Robaxin 500mg  will be eRx'ed for prn home use.   Verdis Prime 02/23/2019, 7:31 AM  Patient is doing well.  Discharge home.

## 2019-02-23 NOTE — Discharge Summary (Signed)
Physician Discharge Summary  Patient ID: [name]. MRN: [id number] DOB/AGE: Dec 28, 1945 73 y.o.  Admit date: 02/22/2019 Discharge date: 02/23/2019  Admission Diagnoses: Lumbar foraminal stenosis, lumbar scoliosis, lumbago, radiculopathy    Discharge Diagnoses: Lumbar foraminal stenosis, lumbar scoliosis, lumbago, radiculopathy s/p Lumbar Five to Sacral One Anterior Lumbar Interbody Fusion (N/A) - Lumbar 5 to Sacral 1 Anterior lumbar interbody fusion ABDOMINAL EXPOSURE (N/A)    Active Problems:   Lumbar scoliosis   Discharged Condition: good  Hospital Course: [name] was admitted for surgery with dx lumbar foraminal stenosis with radiculopathy. Following uncomplicated ALIF F5-D3, he recovered nicely and transferred to [facility] for nursing care and therapies. He is mobilizing well.   Consults: Vascular surgery, Co-surgeon Dr. Sherren Mocha Early  Significant Diagnostic Studies: radiology: X-Ray: intra-op  Treatments: surgery: Lumbar Five to Sacral One Anterior Lumbar Interbody Fusion (N/A) - Lumbar 5 to Sacral 1 Anterior lumbar interbody fusion ABDOMINAL EXPOSURE (N/A)    Discharge Exam: Blood pressure (!) 106/59, pulse 62, temperature (!) 97.4 F (36.3 C), temperature source Oral, resp. rate 18, height 5' 10.5" (1.791 m), weight 94.4 kg, SpO2 97 %. Alert, conversant. MAEW with good strength. PReports no leg pain, no back pain. Passing gas. Belly soft and nontender. Incision without erythema, swelling, or drainage beneath honeycomb and Dermabond.    Disposition: Discharge to home.  Pt verbalizes understanding of d/c instructions and will call office to schedule 3-4 week f/u visit. Percocet 5/325 and Robaxin 500mg  will be eRx'ed for prn home use.    Discharge Instructions    Diet - low sodium heart healthy   Complete by: As directed    Increase activity slowly   Complete by: As directed      Allergies as of 02/23/2019   No Known Allergies      Medication List    STOP taking these medications   clopidogrel 75 MG tablet Commonly known as: PLAVIX     TAKE these medications   buPROPion 100 MG 12 hr tablet Commonly known as: WELLBUTRIN SR TAKE 1 TABLET BY MOUTH TWO  TIMES DAILY What changed: when to take this   Co Q 10 100 MG Caps Take 200 mg by mouth daily.   ferrous sulfate 325 (65 FE) MG tablet Take 325 mg by mouth daily with breakfast.   methocarbamol 500 MG tablet Commonly known as: ROBAXIN Take 1 tablet (500 mg total) by mouth every 6 (six) hours as needed for muscle spasms.   multivitamin,tx-minerals tablet Take 1 tablet by mouth daily.   oxyCODONE-acetaminophen 5-325 MG tablet Commonly known as: Percocet Take 1 tablet by mouth every 4 (four) hours as needed for moderate pain or severe pain.   SUPER B COMPLEX PO Take 1 tablet by mouth daily.   tamsulosin 0.4 MG Caps capsule Commonly known as: FLOMAX TAKE 1 CAPSULE BY MOUTH  DAILY   traMADol 50 MG tablet Commonly known as: ULTRAM TAKE 1 TABLET 3 TIMES A DAY AS NEEDED What changed: See the new instructions.   Vitamin D 1000 units capsule Take 1,000 Units by mouth daily.   vitamin E 400 UNIT capsule Take 400 Units by mouth daily.        Signed: Peggyann Shoals, MD 02/23/2019, 7:46 AM

## 2019-02-23 NOTE — Evaluation (Signed)
Physical Therapy Evaluation Patient Details Name: [name]. MRN: [id number] DOB: 1945-07-23 Today's Date: 02/23/2019   History of Present Illness  Pt is a 73 y/o male s/p ALIF L5-S1. PMH: BPH, umbilical hernia repair, inguinal hernia repair.  Clinical Impression  Pt admitted with above diagnosis. Pt currently with functional limitations due to the deficits listed below (see PT Problem List). At the time of PT eval pt was able to perform transfers and ambulation with gross supervision for safety with a RW for support. Pt was educated on precautions, brace application/wearing schedule, activity progression and car transfer. Pt will benefit from skilled PT to increase their independence and safety with mobility to allow discharge to the venue listed below.       Follow Up Recommendations No PT follow up;Supervision for mobility/OOB    Equipment Recommendations  None recommended by PT    Recommendations for Other Services       Precautions / Restrictions Precautions Precautions: Back Precaution Booklet Issued: Yes (comment) Precaution Comments: reviewed precautions with pt, min cueing to adhere Required Braces or Orthoses: Spinal Brace Spinal Brace: Lumbar corset;Applied in sitting position Restrictions Weight Bearing Restrictions: No      Mobility  Bed Mobility               General bed mobility comments: EOB upon entry, reviewed log roll technique  Transfers Overall transfer level: Needs assistance Equipment used: Rolling walker (2 wheeled) Transfers: Sit to/from Stand Sit to Stand: Supervision         General transfer comment: cueing for hand placement and posture, supervision for safety  Ambulation/Gait Ambulation/Gait assistance: Supervision Gait Distance (Feet): 400 Feet Assistive device: Rolling walker (2 wheeled) Gait Pattern/deviations: Step-through pattern;Decreased stride length;Trunk flexed Gait velocity: Decreased Gait velocity  interpretation: <1.8 ft/sec, indicate of risk for recurrent falls General Gait Details: Pt was cued for improved posture as he tends to flex over the walker. Able to make corrective changes but does not maintain.   Stairs            Wheelchair Mobility    Modified Rankin (Stroke Patients Only)       Balance Overall balance assessment: Needs assistance Sitting-balance support: No upper extremity supported;Feet supported Sitting balance-Leahy Scale: Good     Standing balance support: Bilateral upper extremity supported;During functional activity;No upper extremity supported Standing balance-Leahy Scale: Fair Standing balance comment: reliant on BUE support dyanmically, able to engage in static ADls without UE support                             Pertinent Vitals/Pain Pain Assessment: Faces Faces Pain Scale: Hurts a little bit Pain Location: incisional Pain Descriptors / Indicators: Discomfort;Operative site guarding Pain Intervention(s): Monitored during session    Home Living Family/patient expects to be discharged to:: Private residence Living Arrangements: Spouse/significant other Available Help at Discharge: Family;Available 24 hours/day Type of Home: House Home Access: Stairs to enter   [employer] of Steps: 1 Home Layout: One level Home Equipment: Walker - 2 wheels;Cane - single point      Prior Function Level of Independence: Independent               Hand Dominance        Extremity/Trunk Assessment   Upper Extremity Assessment Upper Extremity Assessment: Defer to OT evaluation    Lower Extremity Assessment Lower Extremity Assessment: Generalized weakness(Consistent with pre-op diagnosis)    Cervical / Trunk  Assessment Cervical / Trunk Assessment: Other exceptions Cervical / Trunk Exceptions: s/p spinal surgery  Communication   Communication: No difficulties  Cognition Arousal/Alertness: Awake/alert Behavior During  Therapy: WFL for tasks assessed/performed Overall Cognitive Status: Within Functional Limits for tasks assessed                                 General Comments: some STM deficits noted, appears baseline      General Comments      Exercises     Assessment/Plan    PT Assessment Patient needs continued PT services  PT Problem List Decreased strength;Decreased activity tolerance;Decreased balance;Decreased mobility;Decreased knowledge of use of DME;Decreased safety awareness;Decreased knowledge of precautions;Pain       PT Treatment Interventions DME instruction;Gait training;Functional mobility training;Therapeutic activities;Therapeutic exercise;Neuromuscular re-education;Patient/family education    PT Goals (Current goals can be found in the Care Plan section)  Acute Rehab PT Goals Patient Stated Goal: home today PT Goal Formulation: With patient Time For Goal Achievement: 03/09/19 Potential to Achieve Goals: Good    Frequency Min 5X/week   Barriers to discharge        Co-evaluation               AM-PAC PT "6 Clicks" Mobility  Outcome Measure Help needed turning from your back to your side while in a flat bed without using bedrails?: None Help needed moving from lying on your back to sitting on the side of a flat bed without using bedrails?: None Help needed moving to and from a bed to a chair (including a wheelchair)?: None Help needed standing up from a chair using your arms (e.g., wheelchair or bedside chair)?: None Help needed to walk in hospital room?: None Help needed climbing 3-5 steps with a railing? : A Little 6 Click Score: 23    End of Session Equipment Utilized During Treatment: Gait belt Activity Tolerance: Patient tolerated treatment well Patient left: with call bell/phone within reach;Other (comment)(Sitting EOB) Nurse Communication: Mobility status PT Visit Diagnosis: Unsteadiness on feet (R26.81);Pain Pain - part of body:  (back)    Time: 4818-5631 PT Time Calculation (min) (ACUTE ONLY): 16 min   Charges:   PT Evaluation $PT Eval Moderate Complexity: 1 Mod          Rolinda Roan, PT, DPT Acute Rehabilitation Services Pager: [phone] Office: [phone]   Thelma Comp 02/23/2019, 1:31 PM

## 2019-02-24 ENCOUNTER — Encounter (HOSPITAL_COMMUNITY): Payer: Self-pay | Admitting: Neurosurgery

## 2019-03-15 ENCOUNTER — Other Ambulatory Visit: Payer: Self-pay | Admitting: Internal Medicine

## 2019-03-15 DIAGNOSIS — M5416 Radiculopathy, lumbar region: Secondary | ICD-10-CM | POA: Diagnosis not present

## 2019-03-23 DIAGNOSIS — H04123 Dry eye syndrome of bilateral lacrimal glands: Secondary | ICD-10-CM | POA: Diagnosis not present

## 2019-03-23 DIAGNOSIS — H35372 Puckering of macula, left eye: Secondary | ICD-10-CM | POA: Diagnosis not present

## 2019-03-23 DIAGNOSIS — Z961 Presence of intraocular lens: Secondary | ICD-10-CM | POA: Diagnosis not present

## 2019-03-23 DIAGNOSIS — H35033 Hypertensive retinopathy, bilateral: Secondary | ICD-10-CM | POA: Diagnosis not present

## 2019-05-03 ENCOUNTER — Other Ambulatory Visit: Payer: Self-pay | Admitting: Neurology

## 2019-05-03 ENCOUNTER — Other Ambulatory Visit: Payer: Self-pay | Admitting: Internal Medicine

## 2019-05-03 NOTE — Telephone Encounter (Signed)
Please confirm how he is taking it before refilling once or twice daily. Pharmacy has questions

## 2019-05-17 DIAGNOSIS — M48061 Spinal stenosis, lumbar region without neurogenic claudication: Secondary | ICD-10-CM | POA: Diagnosis not present

## 2019-06-14 DIAGNOSIS — M48061 Spinal stenosis, lumbar region without neurogenic claudication: Secondary | ICD-10-CM | POA: Diagnosis not present

## 2019-06-14 DIAGNOSIS — M4120 Other idiopathic scoliosis, site unspecified: Secondary | ICD-10-CM | POA: Diagnosis not present

## 2019-06-14 DIAGNOSIS — M5416 Radiculopathy, lumbar region: Secondary | ICD-10-CM | POA: Diagnosis not present

## 2019-06-22 ENCOUNTER — Other Ambulatory Visit: Payer: Self-pay | Admitting: Neurosurgery

## 2019-06-22 DIAGNOSIS — M5416 Radiculopathy, lumbar region: Secondary | ICD-10-CM

## 2019-06-24 ENCOUNTER — Other Ambulatory Visit: Payer: Self-pay

## 2019-06-24 ENCOUNTER — Ambulatory Visit (HOSPITAL_COMMUNITY)
Admission: RE | Admit: 2019-06-24 | Discharge: 2019-06-24 | Disposition: A | Discharge status: Home / Self Care | Payer: Medicare Other | Source: Ambulatory Visit | Attending: Neurosurgery | Admitting: Neurosurgery

## 2019-06-24 DIAGNOSIS — M5416 Radiculopathy, lumbar region: Secondary | ICD-10-CM

## 2019-06-24 DIAGNOSIS — M48061 Spinal stenosis, lumbar region without neurogenic claudication: Secondary | ICD-10-CM | POA: Diagnosis not present

## 2019-06-24 LAB — CREATININE, SERUM
Creatinine, Ser: 1.54 mg/dL — ABNORMAL HIGH (ref 0.61–1.24)
GFR calc Af Amer: 51 mL/min — ABNORMAL LOW (ref >60)
GFR calc non Af Amer: 44 mL/min — ABNORMAL LOW (ref >60)

## 2019-06-24 MED ORDER — GADOBUTROL 1 MMOL/ML IV SOLN
9.0000 mL | Freq: Once | INTRAVENOUS | Status: AC | PRN
  Administered 2019-06-24: 17:00:00 9 mL via INTRAVENOUS

## 2019-06-28 ENCOUNTER — Ambulatory Visit (INDEPENDENT_AMBULATORY_CARE_PROVIDER_SITE_OTHER): Payer: Medicare Other | Admitting: Internal Medicine

## 2019-06-28 ENCOUNTER — Other Ambulatory Visit: Payer: Self-pay

## 2019-06-28 ENCOUNTER — Encounter: Payer: Self-pay | Admitting: Internal Medicine

## 2019-06-28 ENCOUNTER — Telehealth: Payer: Self-pay | Admitting: Hematology

## 2019-06-28 ENCOUNTER — Encounter: Payer: Medicare Other | Admitting: Internal Medicine

## 2019-06-28 VITALS — BP 120/70 | HR 67 | Temp 98.0°F | Ht 70.0 in | Wt 208.0 lb

## 2019-06-28 DIAGNOSIS — Z8659 Personal history of other mental and behavioral disorders: Secondary | ICD-10-CM

## 2019-06-28 DIAGNOSIS — N401 Enlarged prostate with lower urinary tract symptoms: Secondary | ICD-10-CM

## 2019-06-28 DIAGNOSIS — M545 Low back pain: Secondary | ICD-10-CM | POA: Diagnosis not present

## 2019-06-28 DIAGNOSIS — M5416 Radiculopathy, lumbar region: Secondary | ICD-10-CM | POA: Diagnosis not present

## 2019-06-28 DIAGNOSIS — M533 Sacrococcygeal disorders, not elsewhere classified: Secondary | ICD-10-CM | POA: Diagnosis not present

## 2019-06-28 DIAGNOSIS — Z0000 Encounter for general adult medical examination without abnormal findings: Secondary | ICD-10-CM

## 2019-06-28 DIAGNOSIS — N1831 Chronic kidney disease, stage 3a: Secondary | ICD-10-CM

## 2019-06-28 DIAGNOSIS — R7989 Other specified abnormal findings of blood chemistry: Secondary | ICD-10-CM | POA: Diagnosis not present

## 2019-06-28 DIAGNOSIS — Z8614 Personal history of Methicillin resistant Staphylococcus aureus infection: Secondary | ICD-10-CM

## 2019-06-28 DIAGNOSIS — R351 Nocturia: Secondary | ICD-10-CM

## 2019-06-28 DIAGNOSIS — R531 Weakness: Secondary | ICD-10-CM | POA: Diagnosis not present

## 2019-06-28 NOTE — Telephone Encounter (Signed)
Received a cal from Dr. Verlene Mayer office to schedule an urgent appt for [name] to be seen for an abnl MRI, suspicious for metastatic disease. Mr. Gonyeau has been scheduled to see Dr. Irene Limbo on 12/8 at [place] will notify the pt. She's been made aware that [name] can have one person accompany him to the visit and he should arrive 15 minutes early.

## 2019-06-28 NOTE — Progress Notes (Signed)
   Subjective:    Patient ID: [name]., male    DOB: [birth year], 73 y.o.   MRN: [id number]  HPI see note     Review of Systems     Objective:   Physical Exam        Assessment & Plan:
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
## 2019-06-29 ENCOUNTER — Other Ambulatory Visit: Payer: Self-pay

## 2019-06-29 ENCOUNTER — Telehealth: Payer: Self-pay | Admitting: Internal Medicine

## 2019-06-29 ENCOUNTER — Ambulatory Visit: Payer: Medicare Other

## 2019-06-29 ENCOUNTER — Inpatient Hospital Stay: Payer: Medicare Other

## 2019-06-29 ENCOUNTER — Encounter: Payer: Self-pay | Admitting: Internal Medicine

## 2019-06-29 ENCOUNTER — Inpatient Hospital Stay: Payer: Medicare Other | Attending: Hematology | Admitting: Hematology

## 2019-06-29 VITALS — BP 117/73 | HR 75 | Temp 98.5°F | Resp 18 | Ht 70.0 in | Wt 209.1 lb

## 2019-06-29 DIAGNOSIS — M899 Disorder of bone, unspecified: Secondary | ICD-10-CM | POA: Diagnosis not present

## 2019-06-29 DIAGNOSIS — D649 Anemia, unspecified: Secondary | ICD-10-CM | POA: Insufficient documentation

## 2019-06-29 DIAGNOSIS — C7951 Secondary malignant neoplasm of bone: Secondary | ICD-10-CM

## 2019-06-29 DIAGNOSIS — N189 Chronic kidney disease, unspecified: Secondary | ICD-10-CM | POA: Insufficient documentation

## 2019-06-29 DIAGNOSIS — E8809 Other disorders of plasma-protein metabolism, not elsewhere classified: Secondary | ICD-10-CM

## 2019-06-29 DIAGNOSIS — M109 Gout, unspecified: Secondary | ICD-10-CM | POA: Insufficient documentation

## 2019-06-29 DIAGNOSIS — C801 Malignant (primary) neoplasm, unspecified: Secondary | ICD-10-CM

## 2019-06-29 DIAGNOSIS — I129 Hypertensive chronic kidney disease with stage 1 through stage 4 chronic kidney disease, or unspecified chronic kidney disease: Secondary | ICD-10-CM | POA: Diagnosis not present

## 2019-06-29 DIAGNOSIS — R779 Abnormality of plasma protein, unspecified: Secondary | ICD-10-CM

## 2019-06-29 LAB — CBC WITH DIFFERENTIAL/PLATELET
Abs Immature Granulocytes: 0.01 10*3/uL (ref 0.00–0.07)
Basophils Absolute: 0 10*3/uL (ref 0.0–0.1)
Basophils Relative: 0 %
Eosinophils Absolute: 0.1 10*3/uL (ref 0.0–0.5)
Eosinophils Relative: 2 %
HCT: 32.7 % — ABNORMAL LOW (ref 39.0–52.0)
Hemoglobin: 10.7 g/dL — ABNORMAL LOW (ref 13.0–17.0)
Immature Granulocytes: 0 %
Lymphocytes Relative: 50 %
Lymphs Abs: 2 10*3/uL (ref 0.7–4.0)
MCH: 35.1 pg — ABNORMAL HIGH (ref 26.0–34.0)
MCHC: 32.7 g/dL (ref 30.0–36.0)
MCV: 107.2 fL — ABNORMAL HIGH (ref 80.0–100.0)
Monocytes Absolute: 0.4 10*3/uL (ref 0.1–1.0)
Monocytes Relative: 11 %
Neutro Abs: 1.5 10*3/uL — ABNORMAL LOW (ref 1.7–7.7)
Neutrophils Relative %: 37 %
Platelets: 224 10*3/uL (ref 150–400)
RBC: 3.05 MIL/uL — ABNORMAL LOW (ref 4.22–5.81)
RDW: 14.4 % (ref 11.5–15.5)
WBC: 4 10*3/uL (ref 4.0–10.5)
nRBC: 0 % (ref 0.0–0.2)

## 2019-06-29 LAB — CMP (CANCER CENTER ONLY)
ALT: 11 U/L (ref 0–44)
AST: 23 U/L (ref 15–41)
Albumin: 3.6 g/dL (ref 3.5–5.0)
Alkaline Phosphatase: 78 U/L (ref 38–126)
Anion gap: 3 — ABNORMAL LOW (ref 5–15)
BUN: 17 mg/dL (ref 8–23)
CO2: 28 mmol/L (ref 22–32)
Calcium: 8.7 mg/dL — ABNORMAL LOW (ref 8.9–10.3)
Chloride: 104 mmol/L (ref 98–111)
Creatinine: 1.57 mg/dL — ABNORMAL HIGH (ref 0.61–1.24)
GFR, Est AFR Am: 50 mL/min — ABNORMAL LOW (ref >60)
GFR, Estimated: 43 mL/min — ABNORMAL LOW (ref >60)
Glucose, Bld: 93 mg/dL (ref 70–99)
Potassium: 4.2 mmol/L (ref 3.5–5.1)
Sodium: 135 mmol/L (ref 135–145)
Total Bilirubin: 0.5 mg/dL (ref 0.3–1.2)
Total Protein: 9.7 g/dL — ABNORMAL HIGH (ref 6.5–8.1)

## 2019-06-29 LAB — SEDIMENTATION RATE: Sed Rate: 57 mm/hr — ABNORMAL HIGH (ref 0–16)

## 2019-06-29 LAB — LACTATE DEHYDROGENASE: LDH: 154 U/L (ref 98–192)

## 2019-06-29 LAB — VITAMIN D 25 HYDROXY (VIT D DEFICIENCY, FRACTURES): Vit D, 25-Hydroxy: 37.77 ng/mL (ref 30–100)

## 2019-06-29 NOTE — Patient Instructions (Addendum)
Discussed findings and showed him report from MRI.  He is agreeable to seeing oncology tomorrow for evaluation.Appt 1pm with Dr. Irene Limbo
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
## 2019-06-29 NOTE — Patient Instructions (Signed)
Thank you for choosing [facility] to provide your oncology and hematology care.   Should you have questions after your visit to the [facility] ([facility]), please contact this office at [phone] between 8:30 AM and 4:30 PM.  Voice mails left after 4:00 PM may not be returned until the following business day.  Calls received after 4:30 PM will be answered by an off-site Nurse Triage Line.    Prescription Refills:  Please have your pharmacy contact us directly for most prescription requests.  Contact the office directly for refills of narcotics (pain medications). Allow 48-72 hours for refills.  Appointments: Please contact the [facility] scheduling department [phone] for questions regarding [facility] appointment scheduling.  Contact the schedulers with any scheduling changes so that your appointment can be rescheduled in a timely manner.   Central Scheduling for [place] [phone] - Call to schedule procedures such as PET scans, CT scans, MRI, Ultrasound, etc.  To afford each patient quality time with our providers, please arrive 30 minutes before your scheduled appointment time.  If you arrive late for your appointment, you may be asked to reschedule.  We strive to give you quality time with our providers, and arriving late affects you and other patients whose appointments are after yours. If you are a no show for multiple scheduled visits, you may be dismissed from the clinic at the providers discretion.     Resources: [facility] Social Workers [phone] for additional information on assistance programs --Anne Cunningham/Abigail Elmore  [place] DSS  [phone]: Information regarding food stamps, Medicaid, and utility assistance [employer] 336-333-6589   [employer] [place] [employer]'s shared-ride transportation service for eligible riders who have a disability that prevents them from riding the fixed route bus.   Medicare Rights Center 800-333-4114 Helps people with  Medicare understand their rights and benefits, navigate the Medicare system, and secure the quality healthcare they deserve [employer] 800-227-2345 Assists patients locate various types of support and financial assistance Cancer Care: 1-800-813-HOPE (4673) Provides financial assistance, online support groups, medication/co-pay assistance.   Transportation Assistance for appointments at [facility]: Transportation Coordinator [phone]  Again, thank you for choosing [facility] for your care.       

## 2019-06-29 NOTE — Telephone Encounter (Signed)
Faxed office notes to Dr Vertell Limber and also let him know patient has been schedule to see DR Irene Limbo on 06/29/19

## 2019-06-30 LAB — CBC WITH DIFFERENTIAL/PLATELET
Absolute Monocytes: 551 cells/uL (ref 200–950)
Basophils Absolute: 32 cells/uL (ref 0–200)
Basophils Relative: 0.6 %
Eosinophils Absolute: 58 cells/uL (ref 15–500)
Eosinophils Relative: 1.1 %
HCT: 33.6 % — ABNORMAL LOW (ref 38.5–50.0)
Hemoglobin: 11.2 g/dL — ABNORMAL LOW (ref 13.2–17.1)
Lymphs Abs: 2677 cells/uL (ref 850–3900)
MCH: 34.8 pg — ABNORMAL HIGH (ref 27.0–33.0)
MCHC: 33.3 g/dL (ref 32.0–36.0)
MCV: 104.3 fL — ABNORMAL HIGH (ref 80.0–100.0)
MPV: 9.5 fL (ref 7.5–12.5)
Monocytes Relative: 10.4 %
Neutro Abs: 1982 cells/uL (ref 1500–7800)
Neutrophils Relative %: 37.4 %
Platelets: 244 10*3/uL (ref 140–400)
RBC: 3.22 10*6/uL — ABNORMAL LOW (ref 4.20–5.80)
RDW: 13.2 % (ref 11.0–15.0)
Total Lymphocyte: 50.5 %
WBC: 5.3 10*3/uL (ref 3.8–10.8)

## 2019-06-30 LAB — PROTEIN ELECTROPHORESIS, SERUM
Abnormal Protein Band1: 3.2 g/dL — ABNORMAL HIGH
Albumin ELP: 4.5 g/dL (ref 3.8–4.8)
Alpha 1: 0.3 g/dL (ref 0.2–0.3)
Alpha 2: 1 g/dL — ABNORMAL HIGH (ref 0.5–0.9)
Beta 2: 0.2 g/dL (ref 0.2–0.5)
Beta Globulin: 0.5 g/dL (ref 0.4–0.6)
Gamma Globulin: 3.4 g/dL — ABNORMAL HIGH (ref 0.8–1.7)
Total Protein: 9.9 g/dL — ABNORMAL HIGH (ref 6.1–8.1)

## 2019-06-30 LAB — COMPLETE METABOLIC PANEL WITH GFR
AG Ratio: 0.7 (calc) — ABNORMAL LOW (ref 1.0–2.5)
ALT: 13 U/L (ref 9–46)
AST: 22 U/L (ref 10–35)
Albumin: 4 g/dL (ref 3.6–5.1)
Alkaline phosphatase (APISO): 72 U/L (ref 35–144)
BUN/Creatinine Ratio: 13 (calc) (ref 6–22)
BUN: 18 mg/dL (ref 7–25)
CO2: 25 mmol/L (ref 20–32)
Calcium: 9.2 mg/dL (ref 8.6–10.3)
Chloride: 102 mmol/L (ref 98–110)
Creat: 1.43 mg/dL — ABNORMAL HIGH (ref 0.70–1.18)
GFR, Est African American: 56 mL/min/{1.73_m2} — ABNORMAL LOW (ref >=60)
GFR, Est Non African American: 49 mL/min/{1.73_m2} — ABNORMAL LOW (ref >=60)
Globulin: 5.8 g/dL (calc) — ABNORMAL HIGH (ref 1.9–3.7)
Glucose, Bld: 87 mg/dL (ref 65–99)
Potassium: 4.4 mmol/L (ref 3.5–5.3)
Sodium: 134 mmol/L — ABNORMAL LOW (ref 135–146)
Total Bilirubin: 0.4 mg/dL (ref 0.2–1.2)
Total Protein: 9.8 g/dL — ABNORMAL HIGH (ref 6.1–8.1)

## 2019-06-30 LAB — TSH: TSH: 2.72 mIU/L (ref 0.40–4.50)

## 2019-06-30 LAB — PSA: PSA: 0.5 ng/mL (ref <=4.0)

## 2019-06-30 LAB — KAPPA/LAMBDA LIGHT CHAINS
Kappa free light chain: 3279.5 mg/L — ABNORMAL HIGH (ref 3.3–19.4)
Kappa, lambda light chain ratio: 863.03 — ABNORMAL HIGH (ref 0.26–1.65)
Lambda free light chains: 3.8 mg/L — ABNORMAL LOW (ref 5.7–26.3)

## 2019-06-30 NOTE — Progress Notes (Signed)
Many thanks. 

## 2019-07-01 ENCOUNTER — Other Ambulatory Visit: Payer: Self-pay

## 2019-07-01 DIAGNOSIS — C801 Malignant (primary) neoplasm, unspecified: Secondary | ICD-10-CM

## 2019-07-01 DIAGNOSIS — R779 Abnormality of plasma protein, unspecified: Secondary | ICD-10-CM

## 2019-07-01 DIAGNOSIS — M899 Disorder of bone, unspecified: Secondary | ICD-10-CM | POA: Diagnosis not present

## 2019-07-01 DIAGNOSIS — M109 Gout, unspecified: Secondary | ICD-10-CM | POA: Diagnosis not present

## 2019-07-01 DIAGNOSIS — I129 Hypertensive chronic kidney disease with stage 1 through stage 4 chronic kidney disease, or unspecified chronic kidney disease: Secondary | ICD-10-CM | POA: Diagnosis not present

## 2019-07-01 DIAGNOSIS — C7951 Secondary malignant neoplasm of bone: Secondary | ICD-10-CM

## 2019-07-01 DIAGNOSIS — N189 Chronic kidney disease, unspecified: Secondary | ICD-10-CM | POA: Diagnosis not present

## 2019-07-01 DIAGNOSIS — E8809 Other disorders of plasma-protein metabolism, not elsewhere classified: Secondary | ICD-10-CM

## 2019-07-01 DIAGNOSIS — D649 Anemia, unspecified: Secondary | ICD-10-CM | POA: Diagnosis not present

## 2019-07-01 LAB — MULTIPLE MYELOMA PANEL, SERUM
Albumin SerPl Elph-Mcnc: 3.9 g/dL (ref 2.9–4.4)
Albumin/Glob SerPl: 0.8 (ref 0.7–1.7)
Alpha 1: 0.2 g/dL (ref 0.0–0.4)
Alpha2 Glob SerPl Elph-Mcnc: 0.7 g/dL (ref 0.4–1.0)
B-Globulin SerPl Elph-Mcnc: 0.8 g/dL (ref 0.7–1.3)
Gamma Glob SerPl Elph-Mcnc: 3.4 g/dL — ABNORMAL HIGH (ref 0.4–1.8)
Globulin, Total: 5.2 g/dL — ABNORMAL HIGH (ref 2.2–3.9)
IgA: <5 mg/dL — ABNORMAL LOW (ref 61–437)
IgG (Immunoglobin G), Serum: 4743 mg/dL — ABNORMAL HIGH (ref 603–1613)
IgM (Immunoglobulin M), Srm: <5 mg/dL — ABNORMAL LOW (ref 15–143)
M Protein SerPl Elph-Mcnc: 3.1 g/dL — ABNORMAL HIGH
Total Protein ELP: 9.1 g/dL — ABNORMAL HIGH (ref 6.0–8.5)

## 2019-07-02 ENCOUNTER — Other Ambulatory Visit (HOSPITAL_COMMUNITY): Payer: Self-pay | Admitting: Hematology

## 2019-07-02 ENCOUNTER — Ambulatory Visit
Admission: RE | Admit: 2019-07-02 | Discharge: 2019-07-02 | Disposition: A | Discharge status: Home / Self Care | Payer: Self-pay | Source: Ambulatory Visit | Attending: Hematology | Admitting: Hematology

## 2019-07-02 DIAGNOSIS — C801 Malignant (primary) neoplasm, unspecified: Secondary | ICD-10-CM

## 2019-07-05 ENCOUNTER — Other Ambulatory Visit: Payer: Self-pay

## 2019-07-05 ENCOUNTER — Ambulatory Visit (HOSPITAL_COMMUNITY)
Admission: RE | Admit: 2019-07-05 | Discharge: 2019-07-05 | Disposition: A | Discharge status: Home / Self Care | Payer: Medicare Other | Source: Ambulatory Visit | Attending: Hematology | Admitting: Hematology

## 2019-07-05 DIAGNOSIS — C7951 Secondary malignant neoplasm of bone: Secondary | ICD-10-CM | POA: Insufficient documentation

## 2019-07-05 DIAGNOSIS — C801 Malignant (primary) neoplasm, unspecified: Secondary | ICD-10-CM

## 2019-07-05 DIAGNOSIS — R779 Abnormality of plasma protein, unspecified: Secondary | ICD-10-CM

## 2019-07-05 DIAGNOSIS — E8809 Other disorders of plasma-protein metabolism, not elsewhere classified: Secondary | ICD-10-CM | POA: Insufficient documentation

## 2019-07-05 DIAGNOSIS — M899 Disorder of bone, unspecified: Secondary | ICD-10-CM | POA: Diagnosis not present

## 2019-07-05 DIAGNOSIS — Z79899 Other long term (current) drug therapy: Secondary | ICD-10-CM | POA: Insufficient documentation

## 2019-07-05 LAB — UPEP/UIFE/LIGHT CHAINS/TP, 24-HR UR
% BETA, Urine: 40.1 %
ALPHA 1 URINE: 6.2 %
Albumin, U: 16.7 %
Alpha 2, Urine: 23.3 %
Free Kappa Lt Chains,Ur: 23.04 mg/L (ref 0.63–113.79)
Free Kappa/Lambda Ratio: 36 — ABNORMAL HIGH (ref 1.03–31.76)
Free Lambda Lt Chains,Ur: 0.64 mg/L (ref 0.47–11.77)
GAMMA GLOBULIN URINE: 13.8 %
M-SPIKE %, Urine: 4 % — ABNORMAL HIGH
M-Spike, Mg/24 Hr: 3 mg/24 hr — ABNORMAL HIGH
Total Protein, Urine-Ur/day: 80 mg/24 hr (ref 30–150)
Total Protein, Urine: 5.6 mg/dL
Total Volume: 1425

## 2019-07-05 LAB — GLUCOSE, CAPILLARY: Glucose-Capillary: 94 mg/dL (ref 70–99)

## 2019-07-05 MED ORDER — FLUDEOXYGLUCOSE F - 18 (FDG) INJECTION
8.9100 | Freq: Once | INTRAVENOUS | Status: AC | PRN
  Administered 2019-07-05: 12:00:00 8.91 via INTRAVENOUS

## 2019-07-06 ENCOUNTER — Other Ambulatory Visit: Payer: Self-pay | Admitting: Physician Assistant

## 2019-07-07 ENCOUNTER — Other Ambulatory Visit: Payer: Self-pay

## 2019-07-07 ENCOUNTER — Ambulatory Visit (HOSPITAL_COMMUNITY)
Admission: RE | Admit: 2019-07-07 | Discharge: 2019-07-07 | Disposition: A | Discharge status: Home / Self Care | Payer: Medicare Other | Source: Ambulatory Visit | Attending: Hematology | Admitting: Hematology

## 2019-07-07 ENCOUNTER — Encounter (HOSPITAL_COMMUNITY): Payer: Self-pay

## 2019-07-07 DIAGNOSIS — M899 Disorder of bone, unspecified: Secondary | ICD-10-CM | POA: Diagnosis present

## 2019-07-07 DIAGNOSIS — C7951 Secondary malignant neoplasm of bone: Secondary | ICD-10-CM

## 2019-07-07 DIAGNOSIS — M898X8 Other specified disorders of bone, other site: Secondary | ICD-10-CM | POA: Diagnosis not present

## 2019-07-07 DIAGNOSIS — I129 Hypertensive chronic kidney disease with stage 1 through stage 4 chronic kidney disease, or unspecified chronic kidney disease: Secondary | ICD-10-CM | POA: Insufficient documentation

## 2019-07-07 DIAGNOSIS — D539 Nutritional anemia, unspecified: Secondary | ICD-10-CM | POA: Insufficient documentation

## 2019-07-07 DIAGNOSIS — R779 Abnormality of plasma protein, unspecified: Secondary | ICD-10-CM

## 2019-07-07 DIAGNOSIS — D47Z9 Other specified neoplasms of uncertain behavior of lymphoid, hematopoietic and related tissue: Secondary | ICD-10-CM | POA: Diagnosis not present

## 2019-07-07 DIAGNOSIS — G4730 Sleep apnea, unspecified: Secondary | ICD-10-CM | POA: Diagnosis not present

## 2019-07-07 DIAGNOSIS — Z79899 Other long term (current) drug therapy: Secondary | ICD-10-CM | POA: Insufficient documentation

## 2019-07-07 DIAGNOSIS — N189 Chronic kidney disease, unspecified: Secondary | ICD-10-CM | POA: Insufficient documentation

## 2019-07-07 DIAGNOSIS — N400 Enlarged prostate without lower urinary tract symptoms: Secondary | ICD-10-CM | POA: Insufficient documentation

## 2019-07-07 DIAGNOSIS — Z801 Family history of malignant neoplasm of trachea, bronchus and lung: Secondary | ICD-10-CM | POA: Diagnosis not present

## 2019-07-07 DIAGNOSIS — C9000 Multiple myeloma not having achieved remission: Secondary | ICD-10-CM | POA: Insufficient documentation

## 2019-07-07 DIAGNOSIS — D4989 Neoplasm of unspecified behavior of other specified sites: Secondary | ICD-10-CM | POA: Diagnosis not present

## 2019-07-07 DIAGNOSIS — C801 Malignant (primary) neoplasm, unspecified: Secondary | ICD-10-CM

## 2019-07-07 DIAGNOSIS — Z8249 Family history of ischemic heart disease and other diseases of the circulatory system: Secondary | ICD-10-CM | POA: Diagnosis not present

## 2019-07-07 DIAGNOSIS — E8809 Other disorders of plasma-protein metabolism, not elsewhere classified: Secondary | ICD-10-CM

## 2019-07-07 LAB — CBC WITH DIFFERENTIAL/PLATELET
Abs Immature Granulocytes: 0.01 10*3/uL (ref 0.00–0.07)
Basophils Absolute: 0 10*3/uL (ref 0.0–0.1)
Basophils Relative: 1 %
Eosinophils Absolute: 0.1 10*3/uL (ref 0.0–0.5)
Eosinophils Relative: 2 %
HCT: 33.4 % — ABNORMAL LOW (ref 39.0–52.0)
Hemoglobin: 11.1 g/dL — ABNORMAL LOW (ref 13.0–17.0)
Immature Granulocytes: 0 %
Lymphocytes Relative: 45 %
Lymphs Abs: 2.2 10*3/uL (ref 0.7–4.0)
MCH: 35.8 pg — ABNORMAL HIGH (ref 26.0–34.0)
MCHC: 33.2 g/dL (ref 30.0–36.0)
MCV: 107.7 fL — ABNORMAL HIGH (ref 80.0–100.0)
Monocytes Absolute: 0.6 10*3/uL (ref 0.1–1.0)
Monocytes Relative: 13 %
Neutro Abs: 1.9 10*3/uL (ref 1.7–7.7)
Neutrophils Relative %: 39 %
Platelets: 229 10*3/uL (ref 150–400)
RBC: 3.1 MIL/uL — ABNORMAL LOW (ref 4.22–5.81)
RDW: 14.3 % (ref 11.5–15.5)
WBC: 4.8 10*3/uL (ref 4.0–10.5)
nRBC: 0 % (ref 0.0–0.2)

## 2019-07-07 LAB — PROTIME-INR
INR: 1 (ref 0.8–1.2)
Prothrombin Time: 12.7 seconds (ref 11.4–15.2)

## 2019-07-07 MED ORDER — FENTANYL CITRATE (PF) 100 MCG/2ML IJ SOLN
INTRAMUSCULAR | Status: AC | PRN
  Administered 2019-07-07 (×2): 50 ug via INTRAVENOUS

## 2019-07-07 MED ORDER — NALOXONE HCL 0.4 MG/ML IJ SOLN
INTRAMUSCULAR | Status: AC
  Filled 2019-07-07: qty 1

## 2019-07-07 MED ORDER — FENTANYL CITRATE (PF) 100 MCG/2ML IJ SOLN
INTRAMUSCULAR | Status: AC
  Filled 2019-07-07: qty 2

## 2019-07-07 MED ORDER — LIDOCAINE HCL (PF) 1 % IJ SOLN
INTRAMUSCULAR | Status: AC | PRN
  Administered 2019-07-07 (×3): 10 mL

## 2019-07-07 MED ORDER — SODIUM CHLORIDE 0.9 % IV SOLN: INTRAVENOUS | Status: DC

## 2019-07-07 MED ORDER — MIDAZOLAM HCL 2 MG/2ML IJ SOLN
INTRAMUSCULAR | Status: AC
  Filled 2019-07-07: qty 2

## 2019-07-07 MED ORDER — FLUMAZENIL 0.5 MG/5ML IV SOLN
INTRAVENOUS | Status: AC
  Filled 2019-07-07: qty 5

## 2019-07-07 MED ORDER — MIDAZOLAM HCL 2 MG/2ML IJ SOLN
INTRAMUSCULAR | Status: AC | PRN
  Administered 2019-07-07 (×2): 1 mg via INTRAVENOUS
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
## 2019-07-07 NOTE — Procedures (Signed)
  Procedure: CT bone marrow core and aspiration bx   CT core bx lytic L sacral lesion   EBL:   minimal Complications:  none immediate  See full dictation in [facility].  Dillard Cannon MD Main # [phone] Pager  [phone]

## 2019-07-07 NOTE — Discharge Instructions (Addendum)
Bone Marrow Aspiration and Bone Marrow Biopsy, Adult, Care After °This sheet gives you information about how to care for yourself after your procedure. Your health care provider may also give you more specific instructions. If you have problems or questions, contact your health care provider. °What can I expect after the procedure? °After the procedure, it is common to have: °· Mild pain and tenderness. °· Swelling. °· Bruising. °Follow these instructions at home: °Puncture site care ° °  ° °· Follow instructions from your health care provider about how to take care of the puncture site. Make sure you: °? Wash your hands with soap and water before you change your bandage (dressing). If soap and water are not available, use hand sanitizer. °? Change your dressing as told by your health care provider. °· Check your puncture site every day for signs of infection. Check for: °? More redness, swelling, or pain. °? More fluid or blood. °? Warmth. °? Pus or a bad smell. °General instructions °· Take over-the-counter and prescription medicines only as told by your health care provider. °· Do not take baths, swim, or use a hot tub until your health care provider approves. Ask if you can take a shower or have a sponge bath. °· Return to your normal activities as told by your health care provider. Ask your health care provider what activities are safe for you. °· Do not drive for 24 hours if you were given a medicine to help you relax (sedative) during your procedure. °· Keep all follow-up visits as told by your health care provider. This is important. °Contact a health care provider if: °· Your pain is not controlled with medicine. °Get help right away if: °· You have a fever. °· You have more redness, swelling, or pain around the puncture site. °· You have more fluid or blood coming from the puncture site. °· Your puncture site feels warm to the touch. °· You have pus or a bad smell coming from the puncture site. °These  symptoms may represent a serious problem that is an emergency. Do not wait to see if the symptoms will go away. Get medical help right away. Call your local emergency services (911 in the [place] Do not drive yourself to the hospital. °Summary °· After the procedure, it is common to have mild pain, tenderness, swelling, and bruising. °· Follow instructions from your health care provider about how to take care of the puncture site. °· Get help right away if you have any symptoms of infection or if you have more blood or fluid coming from the puncture site. °This information is not intended to replace advice given to you by your health care provider. Make sure you discuss any questions you have with your health care provider. °Document Released: 01/25/2005 Document Revised: 10/21/2017 Document Reviewed: 12/20/2015 °Elsevier Patient Education © 2020 [employer] ° ° ° ° °Moderate Conscious Sedation, Adult, Care After °These instructions provide you with information about caring for yourself after your procedure. Your health care provider may also give you more specific instructions. Your treatment has been planned according to current medical practices, but problems sometimes occur. Call your health care provider if you have any problems or questions after your procedure. °What can I expect after the procedure? °After your procedure, it is common: °· To feel sleepy for several hours. °· To feel clumsy and have poor balance for several hours. °· To have poor judgment for several hours. °· To vomit if you eat too soon. °  Follow these instructions at home: °For at least 24 hours after the procedure: ° °· Do not: °? Participate in activities where you could fall or become injured. °? Drive. °? Use heavy machinery. °? Drink alcohol. °? Take sleeping pills or medicines that cause drowsiness. °? Make important decisions or sign legal documents. °? Take care of children on your own. °· Rest. °Eating and drinking °· Follow the  diet recommended by your health care provider. °· If you vomit: °? Drink water, juice, or soup when you can drink without vomiting. °? Make sure you have little or no nausea before eating solid foods. °General instructions °· Have a responsible adult stay with you until you are awake and alert. °· Take over-the-counter and prescription medicines only as told by your health care provider. °· If you smoke, do not smoke without supervision. °· Keep all follow-up visits as told by your health care provider. This is important. °Contact a health care provider if: °· You keep feeling nauseous or you keep vomiting. °· You feel light-headed. °· You develop a rash. °· You have a fever. °Get help right away if: °· You have trouble breathing. °This information is not intended to replace advice given to you by your health care provider. Make sure you discuss any questions you have with your health care provider. °Document Released: 04/28/2013 Document Revised: 06/20/2017 Document Reviewed: 10/28/2015 °Elsevier Patient Education © 2020 [employer] Inc[employer] ° °

## 2019-07-08 ENCOUNTER — Other Ambulatory Visit: Payer: Self-pay | Admitting: Hematology

## 2019-07-08 ENCOUNTER — Telehealth: Payer: Self-pay | Admitting: *Deleted

## 2019-07-08 MED ORDER — GABAPENTIN 100 MG PO CAPS: 100.0000 mg | ORAL_CAPSULE | Freq: Three times a day (TID) | ORAL | 0 refills | Status: DC

## 2019-07-08 MED ORDER — TRAMADOL HCL 50 MG PO TABS: 50.0000 mg | ORAL_TABLET | Freq: Four times a day (QID) | ORAL | 0 refills | Status: DC | PRN

## 2019-07-08 NOTE — Telephone Encounter (Signed)
Patient requested refills for Gabapentin 100 mg, TID and Tramadol 50 mg, q 6h PRN. States he doesn't;t see the doctor who used to prescribe them and hoped Dr. Irene Limbo would refill them. Dr. Irene Limbo informed. Refills sent to pharmacy for 1 fill. Per Dr. Geri Seminole he has results of PET and biopsy and starts patient treatment, he may make medication changes. Contacted patient, advised him meds have been refilled and gave him Dr. Grier Mitts message. He verbalized understanding.

## 2019-07-09 ENCOUNTER — Telehealth: Payer: Self-pay | Admitting: Hematology

## 2019-07-09 LAB — SURGICAL PATHOLOGY

## 2019-07-09 NOTE — Telephone Encounter (Signed)
Scheduled appt per 12/18 sch message - pt is aware of appt date and time   

## 2019-07-13 ENCOUNTER — Other Ambulatory Visit: Payer: Self-pay

## 2019-07-13 ENCOUNTER — Inpatient Hospital Stay (HOSPITAL_BASED_OUTPATIENT_CLINIC_OR_DEPARTMENT_OTHER): Payer: Medicare Other | Admitting: Hematology

## 2019-07-13 VITALS — BP 111/66 | HR 78 | Temp 98.9°F | Resp 18 | Ht 70.0 in | Wt 211.4 lb

## 2019-07-13 DIAGNOSIS — I129 Hypertensive chronic kidney disease with stage 1 through stage 4 chronic kidney disease, or unspecified chronic kidney disease: Secondary | ICD-10-CM | POA: Diagnosis not present

## 2019-07-13 DIAGNOSIS — C7951 Secondary malignant neoplasm of bone: Secondary | ICD-10-CM | POA: Diagnosis not present

## 2019-07-13 DIAGNOSIS — D649 Anemia, unspecified: Secondary | ICD-10-CM | POA: Diagnosis not present

## 2019-07-13 DIAGNOSIS — C9000 Multiple myeloma not having achieved remission: Secondary | ICD-10-CM

## 2019-07-13 DIAGNOSIS — M899 Disorder of bone, unspecified: Secondary | ICD-10-CM | POA: Diagnosis not present

## 2019-07-13 DIAGNOSIS — Z7189 Other specified counseling: Secondary | ICD-10-CM

## 2019-07-13 DIAGNOSIS — N189 Chronic kidney disease, unspecified: Secondary | ICD-10-CM | POA: Diagnosis not present

## 2019-07-13 DIAGNOSIS — C801 Malignant (primary) neoplasm, unspecified: Secondary | ICD-10-CM | POA: Diagnosis not present

## 2019-07-13 DIAGNOSIS — M109 Gout, unspecified: Secondary | ICD-10-CM | POA: Diagnosis not present

## 2019-07-13 NOTE — Progress Notes (Signed)
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 07/05/2019 PET/CT ([id number] revealed "1. Left sacral and right iliac bone lesions are hypermetabolic and could reflect metastatic disease or myeloma. No other bone lesions are identified. 2. No primary malignancy is identified in the neck, chest, abdomen or pelvis." -Discussed 07/07/2019 Surgical Pathology Report ([id number]) which revealed "BONE, LEFT, LYTIC LESION, BIOPSY: - Plasma cell neoplasm." -Discussed 07/07/2019 Bone Marrow Report ([id number]) which revealed "BONE MARROW, ASPIRATE, CLOT, CORE: -Hypercellular bone marrow with plasma cell neoplasm." -Pt has IgG  Kappa Multiple Myeloma based on labs. -Pt has Active Mulitple Myeloma based on CRAB criteria: no hypercalcemia, some renal insuffieciency that has been present for years, mild anemia, bone lesions identified  -Waiting on cytogenetics to return for risk stratification -Discussed overall treatment approach of induction treatment with 2 or 3 drug therapy followed by maintenance rx vs HDT-ASCT -Would recommend beginning targeted therapy with Velcade/Dexamethasone due to pt's age and comorbidities and add Revlimid once tolerance is established. -Advised pt that slow, stable exercise is okay. Avoid  lifting, bending or twisting exercises  -Recommended a Sacroilliac brace to assist with back pain -Will begin Dexamethasone + Velcade with C1D1, if pt tolerates treatment well will add Revlimid with C2D1  -also discussed the use of Bisphosphonates (will choose Pamidronate given renall insufficiency). No active dental issues at this time. -Recommended pt f/u with Neurologist for anticoagulation management - currently on plavix -Will set up chemotherapy counseling in  5-7 days -Will begin C1D1 of Dexamethasone + Velcade in 1 week -will consider rad onc consultation for ISRT to sacral lesion if uncontrolled pain despite systemic treatments.   FOLLOW UP: Chemo-counseling for Velcade/Dexamethasone/Revlimid and Pamidronate Please schedule to start Velcade/Dexamethasone and Pamidronate in about 1 week  The total time spent in the appt was 40 minutes and more than 50% was on counseling and direct patient cares.  All of the patient's questions were answered with apparent satisfaction. The patient knows to call the clinic with any problems, questions or concerns.    Sullivan Lone MD [place] AAHIVMS [facility] Hematology/Oncology Physician [facility]  (Office):       [phone] (Work cell):  [phone] (Fax):           [phone]  07/15/2019 6:03 AM  I, Yevette Edwards, am acting as a scribe for Dr. Sullivan Lone.   .I have reviewed the above documentation for accuracy and completeness, and I agree with the above. Brunetta Genera MD

## 2019-07-14 ENCOUNTER — Telehealth: Payer: Self-pay | Admitting: Neurology

## 2019-07-14 ENCOUNTER — Encounter (HOSPITAL_COMMUNITY): Payer: Self-pay | Admitting: Hematology

## 2019-07-14 ENCOUNTER — Telehealth: Payer: Self-pay | Admitting: Hematology

## 2019-07-14 NOTE — Telephone Encounter (Signed)
Patient called in and stated he was recently diagnosed with cancer and his oncologist wants to know if it is ok for him to stop clopidogrel (PLAVIX) 75 MG tablet and continue with a baby aspirin. Please advise.

## 2019-07-14 NOTE — Telephone Encounter (Signed)
I reviewed chart and not sure what this is. If oncologist has question for [place], then he may reach out directly to our office. -VRP

## 2019-07-14 NOTE — Telephone Encounter (Signed)
I spoke with patient and informed him of Dr Gladstone Lighter reply (in Dr Rhea Belton absence). He stated he will call his oncologist and have them reach out to Dr Krista Blue next week. Patient verbalized understanding, appreciation.

## 2019-07-14 NOTE — Telephone Encounter (Signed)
No los per 12/22. 

## 2019-07-19 ENCOUNTER — Encounter (HOSPITAL_COMMUNITY): Payer: Self-pay | Admitting: Hematology

## 2019-07-19 ENCOUNTER — Telehealth: Payer: Self-pay | Admitting: Internal Medicine

## 2019-07-19 ENCOUNTER — Telehealth: Payer: Self-pay | Admitting: *Deleted

## 2019-07-19 DIAGNOSIS — Z8673 Personal history of transient ischemic attack (TIA), and cerebral infarction without residual deficits: Secondary | ICD-10-CM

## 2019-07-19 DIAGNOSIS — C9000 Multiple myeloma not having achieved remission: Secondary | ICD-10-CM | POA: Insufficient documentation

## 2019-07-19 DIAGNOSIS — Z7189 Other specified counseling: Secondary | ICD-10-CM | POA: Insufficient documentation

## 2019-07-19 MED ORDER — DEXAMETHASONE 4 MG PO TABS: ORAL_TABLET | ORAL | 3 refills | Status: DC

## 2019-07-19 MED ORDER — ERGOCALCIFEROL 1.25 MG (50000 UT) PO CAPS: 50000.0000 [IU] | ORAL_CAPSULE | ORAL | 3 refills | Status: DC

## 2019-07-19 MED ORDER — CLOPIDOGREL BISULFATE 75 MG PO TABS: 75.0000 mg | ORAL_TABLET | Freq: Every day | ORAL | 1 refills | Status: DC

## 2019-07-19 MED ORDER — PROCHLORPERAZINE MALEATE 10 MG PO TABS: 10.0000 mg | ORAL_TABLET | Freq: Four times a day (QID) | ORAL | 1 refills | Status: DC | PRN

## 2019-07-19 MED ORDER — LORAZEPAM 0.5 MG PO TABS: 0.5000 mg | ORAL_TABLET | Freq: Four times a day (QID) | ORAL | 0 refills | Status: DC | PRN

## 2019-07-19 MED ORDER — ACYCLOVIR 400 MG PO TABS: 400.0000 mg | ORAL_TABLET | Freq: Two times a day (BID) | ORAL | 3 refills | Status: DC

## 2019-07-19 MED ORDER — ONDANSETRON HCL 8 MG PO TABS: 8.0000 mg | ORAL_TABLET | Freq: Two times a day (BID) | ORAL | 1 refills | Status: DC | PRN

## 2019-07-19 NOTE — Progress Notes (Signed)
START ON PATHWAY REGIMEN - Multiple Myeloma and Other Plasma Cell Dyscrasias     A cycle is every 21 days:     Bortezomib      Lenalidomide      Dexamethasone   **Always confirm dose/schedule in your pharmacy ordering system**  Patient Characteristics: Newly Diagnosed, Transplant Eligible, Unknown or Awaiting Test Results R-ISS Staging: Unknown Disease Classification: Newly Diagnosed Is Patient Eligible for Transplant<= Transplant Eligible Risk Status: Awaiting Test Results Intent of Therapy: Non-Curative / Palliative Intent, Discussed with Patient 

## 2019-07-19 NOTE — Telephone Encounter (Signed)
[name] called back to say that he called neurology and they said since he had not been there in 3 years he will need a referral, then they will schedule an appointment.

## 2019-07-19 NOTE — Telephone Encounter (Signed)
I have reviewed his records. It is a blood thinner prescribed for a visual disturbance /TIA that he had and he is supposed to see Neurologist annually to be monitored and prescription renewed- so he needs to call them and get appointment to be seen especially now that he is seeing oncology. If he cannot get an appt soon,I can refill temporarily but he is going to be on chemotherapy per oncology and needs to keep seeing neurologist

## 2019-07-19 NOTE — Telephone Encounter (Signed)
[name] [phone]  [name] called to say that he needs a refill on below medication, it has been filled by [facility], however he has not been there in 3 years, so they will not refill anymore. If he still needs to stay on it, could you refill. I let him know that he may need a office visit to discuss.

## 2019-07-19 NOTE — Telephone Encounter (Signed)
Please refill Plavix x 90 days and put in referral for Neurology  with dx Hx TIA

## 2019-07-19 NOTE — Telephone Encounter (Signed)
Patient called. Asked that Dr.Kale be informed that he and wife had further discussed treatment options Dr. Irene Limbo  presented in appt. He wants to start with the more aggressive treatment instead of starting slow. He asked that Dr. Irene Limbo be informed of this. Message sent to Dr. Irene Limbo.

## 2019-07-19 NOTE — Telephone Encounter (Signed)
Called and relayed message to patient, he was going to call neurology as soon as he hung up with me to schedule appointment, and would like a refill to get him by until he can see Dr Krista Blue, because he is about out.  [facility] [address]  clopidogrel (PLAVIX) 75 MG tablet

## 2019-07-20 ENCOUNTER — Telehealth: Payer: Self-pay | Admitting: *Deleted

## 2019-07-20 ENCOUNTER — Telehealth: Payer: Self-pay | Admitting: Hematology

## 2019-07-20 NOTE — Telephone Encounter (Signed)
Scheduled appt per 12/28 sch message - pt is aware of appt date and times

## 2019-07-20 NOTE — Telephone Encounter (Signed)
Contacted patient with Dr. Irene Limbo instructions: Orders for premeds and nausea meds sent to pharmacy. Start Dexamethasone - take his weekly dose today. Can also start Acyclovir.  Schedule messages sent to schedule appts for patient education and C1D1 of treatment  - informed patient to expect call from scheduling. Also advised patient that he will have 3 other medications for nausea that were sent to pharmacy. They can also be picked up at this time although will not need to take them now. Patient verbalized understanding.

## 2019-07-21 ENCOUNTER — Inpatient Hospital Stay: Payer: Medicare Other

## 2019-07-21 ENCOUNTER — Encounter: Payer: Self-pay | Admitting: Hematology

## 2019-07-21 ENCOUNTER — Other Ambulatory Visit: Payer: Self-pay

## 2019-07-21 NOTE — Progress Notes (Signed)
Met with patient at registration to introduce myself as Arboriculturist and to offer available resources.  Discussed one-time $110 Engineer, drilling to assist with personal expenses while going through treatment. Also, discussed copay assistance if needed if insurance leaves with a balance for treatment.  Gave him my card for any additional financial questions or concerns.

## 2019-07-22 ENCOUNTER — Other Ambulatory Visit: Payer: Self-pay | Admitting: *Deleted

## 2019-07-22 DIAGNOSIS — C9000 Multiple myeloma not having achieved remission: Secondary | ICD-10-CM

## 2019-07-22 NOTE — Patient Instructions (Signed)
Erroneous encounter-duplicare

## 2019-07-26 ENCOUNTER — Other Ambulatory Visit: Payer: Self-pay

## 2019-07-26 ENCOUNTER — Inpatient Hospital Stay: Payer: Medicare Other | Attending: Hematology

## 2019-07-26 ENCOUNTER — Inpatient Hospital Stay: Payer: Medicare Other

## 2019-07-26 VITALS — BP 107/83 | HR 79 | Temp 97.9°F | Resp 16 | Wt 209.9 lb

## 2019-07-26 DIAGNOSIS — Z79899 Other long term (current) drug therapy: Secondary | ICD-10-CM | POA: Diagnosis not present

## 2019-07-26 DIAGNOSIS — Z5112 Encounter for antineoplastic immunotherapy: Secondary | ICD-10-CM | POA: Insufficient documentation

## 2019-07-26 DIAGNOSIS — C9000 Multiple myeloma not having achieved remission: Secondary | ICD-10-CM | POA: Diagnosis not present

## 2019-07-26 DIAGNOSIS — Z7189 Other specified counseling: Secondary | ICD-10-CM

## 2019-07-26 LAB — CBC WITH DIFFERENTIAL (CANCER CENTER ONLY)
Abs Immature Granulocytes: 0.01 10*3/uL (ref 0.00–0.07)
Basophils Absolute: 0 10*3/uL (ref 0.0–0.1)
Basophils Relative: 0 %
Eosinophils Absolute: 0.1 10*3/uL (ref 0.0–0.5)
Eosinophils Relative: 2 %
HCT: 33.2 % — ABNORMAL LOW (ref 39.0–52.0)
Hemoglobin: 11 g/dL — ABNORMAL LOW (ref 13.0–17.0)
Immature Granulocytes: 0 %
Lymphocytes Relative: 44 %
Lymphs Abs: 2.2 10*3/uL (ref 0.7–4.0)
MCH: 35.3 pg — ABNORMAL HIGH (ref 26.0–34.0)
MCHC: 33.1 g/dL (ref 30.0–36.0)
MCV: 106.4 fL — ABNORMAL HIGH (ref 80.0–100.0)
Monocytes Absolute: 0.6 10*3/uL (ref 0.1–1.0)
Monocytes Relative: 13 %
Neutro Abs: 2.1 10*3/uL (ref 1.7–7.7)
Neutrophils Relative %: 41 %
Platelet Count: 200 10*3/uL (ref 150–400)
RBC: 3.12 MIL/uL — ABNORMAL LOW (ref 4.22–5.81)
RDW: 14 % (ref 11.5–15.5)
WBC Count: 5 10*3/uL (ref 4.0–10.5)
nRBC: 0 % (ref 0.0–0.2)

## 2019-07-26 LAB — CMP (CANCER CENTER ONLY)
ALT: 13 U/L (ref 0–44)
AST: 21 U/L (ref 15–41)
Albumin: 3.6 g/dL (ref 3.5–5.0)
Alkaline Phosphatase: 79 U/L (ref 38–126)
Anion gap: 6 (ref 5–15)
BUN: 19 mg/dL (ref 8–23)
CO2: 25 mmol/L (ref 22–32)
Calcium: 9 mg/dL (ref 8.9–10.3)
Chloride: 105 mmol/L (ref 98–111)
Creatinine: 1.67 mg/dL — ABNORMAL HIGH (ref 0.61–1.24)
GFR, Est AFR Am: 47 mL/min — ABNORMAL LOW (ref >60)
GFR, Estimated: 40 mL/min — ABNORMAL LOW (ref >60)
Glucose, Bld: 89 mg/dL (ref 70–99)
Potassium: 4.5 mmol/L (ref 3.5–5.1)
Sodium: 136 mmol/L (ref 135–145)
Total Bilirubin: 0.4 mg/dL (ref 0.3–1.2)
Total Protein: 9.4 g/dL — ABNORMAL HIGH (ref 6.5–8.1)

## 2019-07-26 MED ORDER — DEXAMETHASONE 4 MG PO TABS: 40.0000 mg | ORAL_TABLET | Freq: Once | ORAL | Status: DC

## 2019-07-26 MED ORDER — BORTEZOMIB CHEMO SQ INJECTION 3.5 MG (2.5MG/ML)
1.3000 mg/m2 | Freq: Once | INTRAMUSCULAR | Status: AC
  Administered 2019-07-26: 2.75 mg via SUBCUTANEOUS
  Filled 2019-07-26: qty 1.1

## 2019-07-26 MED ORDER — PROCHLORPERAZINE MALEATE 10 MG PO TABS
10.0000 mg | ORAL_TABLET | Freq: Once | ORAL | Status: AC
  Administered 2019-07-26: 10 mg via ORAL

## 2019-07-26 MED ORDER — PROCHLORPERAZINE MALEATE 10 MG PO TABS
ORAL_TABLET | ORAL | Status: AC
  Filled 2019-07-26: qty 1

## 2019-07-26 MED ORDER — DEXAMETHASONE 4 MG PO TABS
20.0000 mg | ORAL_TABLET | Freq: Once | ORAL | Status: AC
  Administered 2019-07-26: 20 mg via ORAL

## 2019-07-26 MED ORDER — SODIUM CHLORIDE 0.9 % IV SOLN
Freq: Once | INTRAVENOUS | Status: AC
  Filled 2019-07-26: qty 250

## 2019-07-26 MED ORDER — SODIUM CHLORIDE 0.9 % IV SOLN: 60.0000 mg | Freq: Once | INTRAVENOUS | Status: DC

## 2019-07-26 MED ORDER — DEXAMETHASONE 4 MG PO TABS
ORAL_TABLET | ORAL | Status: AC
  Filled 2019-07-26: qty 10

## 2019-07-26 NOTE — Patient Instructions (Addendum)
[facility] Discharge Instructions for Patients Receiving Chemotherapy  Today you received the following chemotherapy agents: Velcade (bortezomib)  To help prevent nausea and vomiting after your treatment, we encourage you to take your nausea medication as directed.   If you develop nausea and vomiting that is not controlled by your nausea medication, call the clinic.   BELOW ARE SYMPTOMS THAT SHOULD BE REPORTED IMMEDIATELY:  *FEVER GREATER THAN 100.5 F  *CHILLS WITH OR WITHOUT FEVER  NAUSEA AND VOMITING THAT IS NOT CONTROLLED WITH YOUR NAUSEA MEDICATION  *UNUSUAL SHORTNESS OF BREATH  *UNUSUAL BRUISING OR BLEEDING  TENDERNESS IN MOUTH AND THROAT WITH OR WITHOUT PRESENCE OF ULCERS  *URINARY PROBLEMS  *BOWEL PROBLEMS  UNUSUAL RASH Items with * indicate a potential emergency and should be followed up as soon as possible.  Feel free to call the clinic you have any questions or concerns. The clinic phone number is [phone].  Bortezomib injection What is this medicine? BORTEZOMIB (bor TEZ oh mib) is a medicine that targets proteins in cancer cells and stops the cancer cells from growing. It is used to treat multiple myeloma and mantle-cell lymphoma. This medicine may be used for other purposes; ask your health care provider or pharmacist if you have questions. COMMON BRAND NAME(S): Velcade What should I tell my health care provider before I take this medicine? They need to know if you have any of these conditions:  diabetes  heart disease  irregular heartbeat  liver disease  on hemodialysis  low blood counts, like low white blood cells, platelets, or hemoglobin  peripheral neuropathy  taking medicine for blood pressure  an unusual or allergic reaction to bortezomib, mannitol, boron, other medicines, foods, dyes, or preservatives  pregnant or trying to get pregnant  breast-feeding How should I use this medicine? This medicine is for injection  into a vein or for injection under the skin. It is given by a health care professional in a hospital or clinic setting. Talk to your pediatrician regarding the use of this medicine in children. Special care may be needed. Overdosage: If you think you have taken too much of this medicine contact a poison control center or emergency room at once. NOTE: This medicine is only for you. Do not share this medicine with others. What if I miss a dose? It is important not to miss your dose. Call your doctor or health care professional if you are unable to keep an appointment. What may interact with this medicine? This medicine may interact with the following medications:  ketoconazole  rifampin  ritonavir  St. John's Wort This list may not describe all possible interactions. Give your health care provider a list of all the medicines, herbs, non-prescription drugs, or dietary supplements you use. Also tell them if you smoke, drink alcohol, or use illegal drugs. Some items may interact with your medicine. What should I watch for while using this medicine? You may get drowsy or dizzy. Do not drive, use machinery, or do anything that needs mental alertness until you know how this medicine affects you. Do not stand or sit up quickly, especially if you are an older patient. This reduces the risk of dizzy or fainting spells. In some cases, you may be given additional medicines to help with side effects. Follow all directions for their use. Call your doctor or health care professional for advice if you get a fever, chills or sore throat, or other symptoms of a cold or flu. Do not treat yourself.  This drug decreases your body's ability to fight infections. Try to avoid being around people who are sick. This medicine may increase your risk to bruise or bleed. Call your doctor or health care professional if you notice any unusual bleeding. You may need blood work done while you are taking this medicine. In some  patients, this medicine may cause a serious brain infection that may cause death. If you have any problems seeing, thinking, speaking, walking, or standing, tell your doctor right away. If you cannot reach your doctor, urgently seek other source of medical care. Check with your doctor or health care professional if you get an attack of severe diarrhea, nausea and vomiting, or if you sweat a lot. The loss of too much body fluid can make it dangerous for you to take this medicine. Do not become pregnant while taking this medicine or for at least 7 months after stopping it. Women should inform their doctor if they wish to become pregnant or think they might be pregnant. Men should not father a child while taking this medicine and for at least 4 months after stopping it. There is a potential for serious side effects to an unborn child. Talk to your health care professional or pharmacist for more information. Do not breast-feed an infant while taking this medicine or for 2 months after stopping it. This medicine may interfere with the ability to have a child. You should talk with your doctor or health care professional if you are concerned about your fertility. What side effects may I notice from receiving this medicine? Side effects that you should report to your doctor or health care professional as soon as possible:  allergic reactions like skin rash, itching or hives, swelling of the face, lips, or tongue  breathing problems  changes in hearing  changes in vision  fast, irregular heartbeat  feeling faint or lightheaded, falls  pain, tingling, numbness in the hands or feet  right upper belly pain  seizures  swelling of the ankles, feet, hands  unusual bleeding or bruising  unusually weak or tired  vomiting  yellowing of the eyes or skin Side effects that usually do not require medical attention (report to your doctor or health care professional if they continue or are  bothersome):  changes in emotions or moods  constipation  diarrhea  loss of appetite  headache  irritation at site where injected  nausea This list may not describe all possible side effects. Call your doctor for medical advice about side effects. You may report side effects to FDA at 1-800-FDA-1088. Where should I keep my medicine? This drug is given in a hospital or clinic and will not be stored at home. NOTE: This sheet is a summary. It may not cover all possible information. If you have questions about this medicine, talk to your doctor, pharmacist, or health care provider.  2020 Elsevier/Gold Standard (2017-11-17 16:29:31)

## 2019-07-26 NOTE — Progress Notes (Signed)
Ok to treat with Scr 1.67 per Dr. Irene Limbo.

## 2019-07-28 ENCOUNTER — Inpatient Hospital Stay: Payer: Medicare Other

## 2019-07-28 ENCOUNTER — Other Ambulatory Visit: Payer: Self-pay

## 2019-07-28 VITALS — BP 125/71 | HR 66 | Temp 98.0°F | Resp 16

## 2019-07-28 DIAGNOSIS — C9000 Multiple myeloma not having achieved remission: Secondary | ICD-10-CM

## 2019-07-28 DIAGNOSIS — Z79899 Other long term (current) drug therapy: Secondary | ICD-10-CM | POA: Diagnosis not present

## 2019-07-28 DIAGNOSIS — Z5112 Encounter for antineoplastic immunotherapy: Secondary | ICD-10-CM | POA: Diagnosis not present

## 2019-07-28 MED ORDER — SODIUM CHLORIDE 0.9 % IV SOLN
Freq: Once | INTRAVENOUS | Status: AC
  Filled 2019-07-28: qty 250

## 2019-07-28 MED ORDER — SODIUM CHLORIDE 0.9 % IV SOLN
60.0000 mg | Freq: Once | INTRAVENOUS | Status: AC
  Administered 2019-07-28: 60 mg via INTRAVENOUS
  Filled 2019-07-28: qty 10

## 2019-07-28 NOTE — Patient Instructions (Signed)
Pamidronate injection What is this medicine? PAMIDRONATE (pa mi DROE nate) slows calcium loss from bones. It is used to treat high calcium blood levels from cancer or Paget's disease. It is also used to treat bone pain and prevent fractures from certain cancers that have spread to the bone. This medicine may be used for other purposes; ask your health care provider or pharmacist if you have questions. COMMON BRAND NAME(S): Aredia What should I tell my health care provider before I take this medicine? They need to know if you have any of these conditions:  aspirin-sensitive asthma  dental disease  kidney disease  an unusual or allergic reaction to pamidronate, other medicines, foods, dyes, or preservatives  pregnant or trying to get pregnant  breast-feeding How should I use this medicine? This medicine is for infusion into a vein. It is given by a health care professional in a hospital or clinic setting. Talk to your pediatrician regarding the use of this medicine in children. This medicine is not approved for use in children. Overdosage: If you think you have taken too much of this medicine contact a poison control center or emergency room at once. NOTE: This medicine is only for you. Do not share this medicine with others. What if I miss a dose? This does not apply. What may interact with this medicine?  certain antibiotics given by injection  medicines for inflammation or pain like ibuprofen, naproxen  some diuretics like bumetanide, furosemide  cyclosporine  parathyroid hormone  tacrolimus  teriparatide  thalidomide This list may not describe all possible interactions. Give your health care provider a list of all the medicines, herbs, non-prescription drugs, or dietary supplements you use. Also tell them if you smoke, drink alcohol, or use illegal drugs. Some items may interact with your medicine. What should I watch for while using this medicine? Visit your doctor or  health care professional for regular checkups. It may be some time before you see the benefit from this medicine. Do not stop taking your medicine unless your doctor tells you to. Your doctor may order blood tests or other tests to see how you are doing. Women should inform their doctor if they wish to become pregnant or think they might be pregnant. There is a potential for serious side effects to an unborn child. Talk to your health care professional or pharmacist for more information. You should make sure that you get enough calcium and vitamin D while you are taking this medicine. Discuss the foods you eat and the vitamins you take with your health care professional. Some people who take this medicine have severe bone, joint, and/or muscle pain. This medicine may also increase your risk for a broken thigh bone. Tell your doctor right away if you have pain in your upper leg or groin. Tell your doctor if you have any pain that does not go away or that gets worse. What side effects may I notice from receiving this medicine? Side effects that you should report to your doctor or health care professional as soon as possible:  allergic reactions like skin rash, itching or hives, swelling of the face, lips, or tongue  black or tarry stools  changes in vision  eye inflammation, pain  high blood pressure  jaw pain, especially burning or cramping  muscle weakness  numb, tingling pain  swelling of feet or hands  trouble passing urine or change in the amount of urine  unable to move easily Side effects that usually do not  require medical attention (report to your doctor or health care professional if they continue or are bothersome):  bone, joint, or muscle pain  constipation  dizzy, drowsy  fever  headache  loss of appetite  nausea, vomiting  pain at site where injected This list may not describe all possible side effects. Call your doctor for medical advice about side effects.  You may report side effects to FDA at 1-800-FDA-1088. Where should I keep my medicine? This drug is given in a hospital or clinic and will not be stored at home. NOTE: This sheet is a summary. It may not cover all possible information. If you have questions about this medicine, talk to your doctor, pharmacist, or health care provider.  2020 Elsevier/Gold Standard (2011-01-04 08:49:49)  

## 2019-07-30 ENCOUNTER — Other Ambulatory Visit: Payer: Self-pay | Admitting: *Deleted

## 2019-07-30 DIAGNOSIS — C9000 Multiple myeloma not having achieved remission: Secondary | ICD-10-CM

## 2019-08-02 ENCOUNTER — Other Ambulatory Visit: Payer: Self-pay

## 2019-08-02 ENCOUNTER — Inpatient Hospital Stay: Payer: Medicare Other

## 2019-08-02 ENCOUNTER — Inpatient Hospital Stay: Payer: Medicare Other | Admitting: Hematology

## 2019-08-02 VITALS — BP 123/77 | HR 71 | Temp 98.2°F | Resp 18 | Ht 70.0 in | Wt 211.4 lb

## 2019-08-02 DIAGNOSIS — C7951 Secondary malignant neoplasm of bone: Secondary | ICD-10-CM | POA: Diagnosis not present

## 2019-08-02 DIAGNOSIS — C9000 Multiple myeloma not having achieved remission: Secondary | ICD-10-CM

## 2019-08-02 DIAGNOSIS — Z79899 Other long term (current) drug therapy: Secondary | ICD-10-CM | POA: Diagnosis not present

## 2019-08-02 DIAGNOSIS — Z7189 Other specified counseling: Secondary | ICD-10-CM

## 2019-08-02 DIAGNOSIS — Z5112 Encounter for antineoplastic immunotherapy: Secondary | ICD-10-CM | POA: Diagnosis not present

## 2019-08-02 LAB — CMP (CANCER CENTER ONLY)
ALT: 14 U/L (ref 0–44)
AST: 21 U/L (ref 15–41)
Albumin: 3.4 g/dL — ABNORMAL LOW (ref 3.5–5.0)
Alkaline Phosphatase: 77 U/L (ref 38–126)
Anion gap: 5 (ref 5–15)
BUN: 18 mg/dL (ref 8–23)
CO2: 22 mmol/L (ref 22–32)
Calcium: 7.9 mg/dL — ABNORMAL LOW (ref 8.9–10.3)
Chloride: 108 mmol/L (ref 98–111)
Creatinine: 1.41 mg/dL — ABNORMAL HIGH (ref 0.61–1.24)
GFR, Est AFR Am: 57 mL/min — ABNORMAL LOW (ref >60)
GFR, Estimated: 49 mL/min — ABNORMAL LOW (ref >60)
Glucose, Bld: 87 mg/dL (ref 70–99)
Potassium: 4.4 mmol/L (ref 3.5–5.1)
Sodium: 135 mmol/L (ref 135–145)
Total Bilirubin: 0.4 mg/dL (ref 0.3–1.2)
Total Protein: 9 g/dL — ABNORMAL HIGH (ref 6.5–8.1)

## 2019-08-02 LAB — CBC WITH DIFFERENTIAL (CANCER CENTER ONLY)
Abs Immature Granulocytes: 0.01 10*3/uL (ref 0.00–0.07)
Basophils Absolute: 0 10*3/uL (ref 0.0–0.1)
Basophils Relative: 1 %
Eosinophils Absolute: 0.1 10*3/uL (ref 0.0–0.5)
Eosinophils Relative: 2 %
HCT: 31.8 % — ABNORMAL LOW (ref 39.0–52.0)
Hemoglobin: 10.7 g/dL — ABNORMAL LOW (ref 13.0–17.0)
Immature Granulocytes: 0 %
Lymphocytes Relative: 39 %
Lymphs Abs: 1.6 10*3/uL (ref 0.7–4.0)
MCH: 35.3 pg — ABNORMAL HIGH (ref 26.0–34.0)
MCHC: 33.6 g/dL (ref 30.0–36.0)
MCV: 105 fL — ABNORMAL HIGH (ref 80.0–100.0)
Monocytes Absolute: 0.4 10*3/uL (ref 0.1–1.0)
Monocytes Relative: 10 %
Neutro Abs: 2 10*3/uL (ref 1.7–7.7)
Neutrophils Relative %: 48 %
Platelet Count: 181 10*3/uL (ref 150–400)
RBC: 3.03 MIL/uL — ABNORMAL LOW (ref 4.22–5.81)
RDW: 14 % (ref 11.5–15.5)
WBC Count: 4.2 10*3/uL (ref 4.0–10.5)
nRBC: 0 % (ref 0.0–0.2)

## 2019-08-02 MED ORDER — BORTEZOMIB CHEMO SQ INJECTION 3.5 MG (2.5MG/ML)
1.3000 mg/m2 | Freq: Once | INTRAMUSCULAR | Status: AC
  Administered 2019-08-02: 2.75 mg via SUBCUTANEOUS
  Filled 2019-08-02: qty 1.1

## 2019-08-02 MED ORDER — PROCHLORPERAZINE MALEATE 10 MG PO TABS
10.0000 mg | ORAL_TABLET | Freq: Once | ORAL | Status: AC
  Administered 2019-08-02: 10 mg via ORAL

## 2019-08-02 MED ORDER — PROCHLORPERAZINE MALEATE 10 MG PO TABS
ORAL_TABLET | ORAL | Status: AC
  Filled 2019-08-02: qty 1

## 2019-08-02 NOTE — Patient Instructions (Signed)
[facility] Discharge Instructions for Patients Receiving Chemotherapy  Today you received the following chemotherapy agents Velcade.  To help prevent nausea and vomiting after your treatment, we encourage you to take your nausea medication as directed.  If you develop nausea and vomiting that is not controlled by your nausea medication, call the clinic.   BELOW ARE SYMPTOMS THAT SHOULD BE REPORTED IMMEDIATELY:  *FEVER GREATER THAN 100.5 F  *CHILLS WITH OR WITHOUT FEVER  NAUSEA AND VOMITING THAT IS NOT CONTROLLED WITH YOUR NAUSEA MEDICATION  *UNUSUAL SHORTNESS OF BREATH  *UNUSUAL BRUISING OR BLEEDING  TENDERNESS IN MOUTH AND THROAT WITH OR WITHOUT PRESENCE OF ULCERS  *URINARY PROBLEMS  *BOWEL PROBLEMS  UNUSUAL RASH Items with * indicate a potential emergency and should be followed up as soon as possible.  Feel free to call the clinic should you have any questions or concerns. The clinic phone number is [phone].  Please show the CHEMO ALERT CARD at check-in to the Emergency Department and triage nurse.   
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
## 2019-08-03 ENCOUNTER — Telehealth: Payer: Self-pay | Admitting: Hematology

## 2019-08-03 NOTE — Telephone Encounter (Signed)
Scheduled appt per 1/11 los.  Patient will get a print out at their next scheduled appt.

## 2019-08-08 ENCOUNTER — Encounter: Payer: Self-pay | Admitting: *Deleted

## 2019-08-08 ENCOUNTER — Ambulatory Visit: Payer: Medicare Other | Attending: Internal Medicine

## 2019-08-08 DIAGNOSIS — Z23 Encounter for immunization: Secondary | ICD-10-CM | POA: Insufficient documentation

## 2019-08-08 NOTE — Progress Notes (Unsigned)
   [id number] Vaccination Clinic  Name:  Dink Tilton.    MRN: [id number] DOB: 11-Aug-1945  08/08/2019  Mr. Earwood was observed post Covid-19 immunization for 15 minutes without incidence. He was provided with Vaccine Information Sheet and instruction to access the V-Safe system.   Mr. Mcclaran was instructed to call 911 with any severe reactions post vaccine: Marland Kitchen Difficulty breathing  . Swelling of your face and throat  . A fast heartbeat  . A bad rash all over your body  . Dizziness and weakness

## 2019-08-09 ENCOUNTER — Inpatient Hospital Stay: Payer: Medicare Other

## 2019-08-09 ENCOUNTER — Other Ambulatory Visit: Payer: Self-pay | Admitting: *Deleted

## 2019-08-09 ENCOUNTER — Other Ambulatory Visit: Payer: Self-pay

## 2019-08-09 VITALS — BP 119/77 | HR 100 | Temp 98.5°F | Resp 18

## 2019-08-09 DIAGNOSIS — C9000 Multiple myeloma not having achieved remission: Secondary | ICD-10-CM

## 2019-08-09 DIAGNOSIS — Z5112 Encounter for antineoplastic immunotherapy: Secondary | ICD-10-CM | POA: Diagnosis not present

## 2019-08-09 DIAGNOSIS — Z79899 Other long term (current) drug therapy: Secondary | ICD-10-CM | POA: Diagnosis not present

## 2019-08-09 DIAGNOSIS — Z7189 Other specified counseling: Secondary | ICD-10-CM

## 2019-08-09 LAB — CBC WITH DIFFERENTIAL (CANCER CENTER ONLY)
Abs Immature Granulocytes: 0.02 10*3/uL (ref 0.00–0.07)
Basophils Absolute: 0 10*3/uL (ref 0.0–0.1)
Basophils Relative: 0 %
Eosinophils Absolute: 0 10*3/uL (ref 0.0–0.5)
Eosinophils Relative: 0 %
HCT: 33.5 % — ABNORMAL LOW (ref 39.0–52.0)
Hemoglobin: 11.2 g/dL — ABNORMAL LOW (ref 13.0–17.0)
Immature Granulocytes: 1 %
Lymphocytes Relative: 17 %
Lymphs Abs: 0.7 10*3/uL (ref 0.7–4.0)
MCH: 35.3 pg — ABNORMAL HIGH (ref 26.0–34.0)
MCHC: 33.4 g/dL (ref 30.0–36.0)
MCV: 105.7 fL — ABNORMAL HIGH (ref 80.0–100.0)
Monocytes Absolute: 0.1 10*3/uL (ref 0.1–1.0)
Monocytes Relative: 1 %
Neutro Abs: 3.2 10*3/uL (ref 1.7–7.7)
Neutrophils Relative %: 81 %
Platelet Count: 196 10*3/uL (ref 150–400)
RBC: 3.17 MIL/uL — ABNORMAL LOW (ref 4.22–5.81)
RDW: 13.9 % (ref 11.5–15.5)
WBC Count: 4 10*3/uL (ref 4.0–10.5)
nRBC: 0 % (ref 0.0–0.2)

## 2019-08-09 LAB — CMP (CANCER CENTER ONLY)
ALT: 13 U/L (ref 0–44)
AST: 19 U/L (ref 15–41)
Albumin: 3.6 g/dL (ref 3.5–5.0)
Alkaline Phosphatase: 76 U/L (ref 38–126)
Anion gap: 8 (ref 5–15)
BUN: 16 mg/dL (ref 8–23)
CO2: 22 mmol/L (ref 22–32)
Calcium: 8.5 mg/dL — ABNORMAL LOW (ref 8.9–10.3)
Chloride: 104 mmol/L (ref 98–111)
Creatinine: 1.62 mg/dL — ABNORMAL HIGH (ref 0.61–1.24)
GFR, Est AFR Am: 48 mL/min — ABNORMAL LOW (ref >60)
GFR, Estimated: 42 mL/min — ABNORMAL LOW (ref >60)
Glucose, Bld: 194 mg/dL — ABNORMAL HIGH (ref 70–99)
Potassium: 4.2 mmol/L (ref 3.5–5.1)
Sodium: 134 mmol/L — ABNORMAL LOW (ref 135–145)
Total Bilirubin: 0.5 mg/dL (ref 0.3–1.2)
Total Protein: 9.7 g/dL — ABNORMAL HIGH (ref 6.5–8.1)

## 2019-08-09 MED ORDER — BORTEZOMIB CHEMO SQ INJECTION 3.5 MG (2.5MG/ML)
1.3000 mg/m2 | Freq: Once | INTRAMUSCULAR | Status: AC
  Administered 2019-08-09: 16:00:00 2.75 mg via SUBCUTANEOUS
  Filled 2019-08-09: qty 1.1

## 2019-08-09 MED ORDER — PROCHLORPERAZINE MALEATE 10 MG PO TABS
ORAL_TABLET | ORAL | Status: AC
  Filled 2019-08-09: qty 1

## 2019-08-09 MED ORDER — PROCHLORPERAZINE MALEATE 10 MG PO TABS
10.0000 mg | ORAL_TABLET | Freq: Once | ORAL | Status: AC
  Administered 2019-08-09: 10 mg via ORAL

## 2019-08-09 NOTE — Progress Notes (Signed)
Thank you so much

## 2019-08-09 NOTE — Progress Notes (Signed)
OK to treat with labs today - including creatinine per MD Irene Limbo

## 2019-08-09 NOTE — Patient Instructions (Signed)
[facility] Discharge Instructions for Patients Receiving Chemotherapy  Today you received the following chemotherapy agents Velcade. To help prevent nausea and vomiting after your treatment, we encourage you to take your nausea medication as directed.  If you develop nausea and vomiting that is not controlled by your nausea medication, call the clinic.   BELOW ARE SYMPTOMS THAT SHOULD BE REPORTED IMMEDIATELY:  *FEVER GREATER THAN 100.5 F  *CHILLS WITH OR WITHOUT FEVER  NAUSEA AND VOMITING THAT IS NOT CONTROLLED WITH YOUR NAUSEA MEDICATION  *UNUSUAL SHORTNESS OF BREATH  *UNUSUAL BRUISING OR BLEEDING  TENDERNESS IN MOUTH AND THROAT WITH OR WITHOUT PRESENCE OF ULCERS  *URINARY PROBLEMS  *BOWEL PROBLEMS  UNUSUAL RASH Items with * indicate a potential emergency and should be followed up as soon as possible.  Feel free to call the clinic you have any questions or concerns. The clinic phone number is [phone].  Please show the CHEMO ALERT CARD at check-in to the Emergency Department and triage nurse.    

## 2019-08-12 ENCOUNTER — Other Ambulatory Visit: Payer: Self-pay | Admitting: Hematology

## 2019-08-12 MED ORDER — LENALIDOMIDE 10 MG PO CAPS: ORAL_CAPSULE | ORAL | 1 refills | Status: DC

## 2019-08-13 ENCOUNTER — Other Ambulatory Visit: Payer: Self-pay | Admitting: *Deleted

## 2019-08-13 DIAGNOSIS — C9000 Multiple myeloma not having achieved remission: Secondary | ICD-10-CM

## 2019-08-16 ENCOUNTER — Other Ambulatory Visit: Payer: Self-pay | Admitting: Hematology

## 2019-08-16 ENCOUNTER — Other Ambulatory Visit: Payer: Self-pay

## 2019-08-16 ENCOUNTER — Telehealth: Payer: Self-pay | Admitting: *Deleted

## 2019-08-16 ENCOUNTER — Inpatient Hospital Stay: Payer: Medicare Other

## 2019-08-16 ENCOUNTER — Inpatient Hospital Stay: Payer: Medicare Other | Admitting: Hematology

## 2019-08-16 VITALS — HR 111

## 2019-08-16 VITALS — BP 128/92 | HR 109 | Temp 98.2°F | Resp 17 | Ht 70.0 in | Wt 214.1 lb

## 2019-08-16 DIAGNOSIS — C7951 Secondary malignant neoplasm of bone: Secondary | ICD-10-CM

## 2019-08-16 DIAGNOSIS — Z5112 Encounter for antineoplastic immunotherapy: Secondary | ICD-10-CM | POA: Diagnosis not present

## 2019-08-16 DIAGNOSIS — C9000 Multiple myeloma not having achieved remission: Secondary | ICD-10-CM

## 2019-08-16 DIAGNOSIS — Z5111 Encounter for antineoplastic chemotherapy: Secondary | ICD-10-CM | POA: Diagnosis not present

## 2019-08-16 DIAGNOSIS — Z7189 Other specified counseling: Secondary | ICD-10-CM

## 2019-08-16 DIAGNOSIS — Z79899 Other long term (current) drug therapy: Secondary | ICD-10-CM | POA: Diagnosis not present

## 2019-08-16 LAB — CMP (CANCER CENTER ONLY)
ALT: 14 U/L (ref 0–44)
AST: 22 U/L (ref 15–41)
Albumin: 3.8 g/dL (ref 3.5–5.0)
Alkaline Phosphatase: 84 U/L (ref 38–126)
Anion gap: 6 (ref 5–15)
BUN: 22 mg/dL (ref 8–23)
CO2: 25 mmol/L (ref 22–32)
Calcium: 8.8 mg/dL — ABNORMAL LOW (ref 8.9–10.3)
Chloride: 103 mmol/L (ref 98–111)
Creatinine: 1.58 mg/dL — ABNORMAL HIGH (ref 0.61–1.24)
GFR, Est AFR Am: 50 mL/min — ABNORMAL LOW (ref >60)
GFR, Estimated: 43 mL/min — ABNORMAL LOW (ref >60)
Glucose, Bld: 154 mg/dL — ABNORMAL HIGH (ref 70–99)
Potassium: 4.1 mmol/L (ref 3.5–5.1)
Sodium: 134 mmol/L — ABNORMAL LOW (ref 135–145)
Total Bilirubin: 0.4 mg/dL (ref 0.3–1.2)
Total Protein: 10 g/dL — ABNORMAL HIGH (ref 6.5–8.1)

## 2019-08-16 LAB — CBC WITH DIFFERENTIAL (CANCER CENTER ONLY)
Abs Immature Granulocytes: 0.01 10*3/uL (ref 0.00–0.07)
Basophils Absolute: 0 10*3/uL (ref 0.0–0.1)
Basophils Relative: 0 %
Eosinophils Absolute: 0 10*3/uL (ref 0.0–0.5)
Eosinophils Relative: 0 %
HCT: 33.3 % — ABNORMAL LOW (ref 39.0–52.0)
Hemoglobin: 11 g/dL — ABNORMAL LOW (ref 13.0–17.0)
Immature Granulocytes: 0 %
Lymphocytes Relative: 12 %
Lymphs Abs: 0.5 10*3/uL — ABNORMAL LOW (ref 0.7–4.0)
MCH: 34.7 pg — ABNORMAL HIGH (ref 26.0–34.0)
MCHC: 33 g/dL (ref 30.0–36.0)
MCV: 105 fL — ABNORMAL HIGH (ref 80.0–100.0)
Monocytes Absolute: 0 10*3/uL — ABNORMAL LOW (ref 0.1–1.0)
Monocytes Relative: 1 %
Neutro Abs: 3.9 10*3/uL (ref 1.7–7.7)
Neutrophils Relative %: 87 %
Platelet Count: 211 10*3/uL (ref 150–400)
RBC: 3.17 MIL/uL — ABNORMAL LOW (ref 4.22–5.81)
RDW: 14.1 % (ref 11.5–15.5)
WBC Count: 4.5 10*3/uL (ref 4.0–10.5)
nRBC: 0 % (ref 0.0–0.2)

## 2019-08-16 MED ORDER — PROCHLORPERAZINE MALEATE 10 MG PO TABS
10.0000 mg | ORAL_TABLET | Freq: Once | ORAL | Status: AC
  Administered 2019-08-16: 10 mg via ORAL

## 2019-08-16 MED ORDER — BORTEZOMIB CHEMO SQ INJECTION 3.5 MG (2.5MG/ML)
1.3000 mg/m2 | Freq: Once | INTRAMUSCULAR | Status: AC
  Administered 2019-08-16: 2.75 mg via SUBCUTANEOUS
  Filled 2019-08-16: qty 1.1

## 2019-08-16 MED ORDER — PROCHLORPERAZINE MALEATE 10 MG PO TABS
ORAL_TABLET | ORAL | Status: AC
  Filled 2019-08-16: qty 1
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
## 2019-08-16 NOTE — Telephone Encounter (Signed)
Refill request. Patient has appt 1/25 @ 1440

## 2019-08-16 NOTE — Progress Notes (Signed)
Pt's creatnine today is 1.58 - ok to treat per Dr. Irene Limbo.  HR 109-111, ok to treat per Dr. Irene Limbo.

## 2019-08-16 NOTE — Telephone Encounter (Signed)
Patient completed [employer] Patient-Physician Agreement Form. Signed form (Pt and Dr. Irene Limbo) faxed to [facility]. Fax confirmation received. [employer] Josem Kaufmann [id number], dated 08/16/2019. Copy of PPAF given to patient.

## 2019-08-16 NOTE — Patient Instructions (Signed)
[facility] Discharge Instructions for Patients Receiving Chemotherapy  Today you received the following chemotherapy agents Velcade. To help prevent nausea and vomiting after your treatment, we encourage you to take your nausea medication as directed.  If you develop nausea and vomiting that is not controlled by your nausea medication, call the clinic.   BELOW ARE SYMPTOMS THAT SHOULD BE REPORTED IMMEDIATELY:  *FEVER GREATER THAN 100.5 F  *CHILLS WITH OR WITHOUT FEVER  NAUSEA AND VOMITING THAT IS NOT CONTROLLED WITH YOUR NAUSEA MEDICATION  *UNUSUAL SHORTNESS OF BREATH  *UNUSUAL BRUISING OR BLEEDING  TENDERNESS IN MOUTH AND THROAT WITH OR WITHOUT PRESENCE OF ULCERS  *URINARY PROBLEMS  *BOWEL PROBLEMS  UNUSUAL RASH Items with * indicate a potential emergency and should be followed up as soon as possible.  Feel free to call the clinic you have any questions or concerns. The clinic phone number is [phone].  Please show the CHEMO ALERT CARD at check-in to the Emergency Department and triage nurse.    

## 2019-08-17 ENCOUNTER — Telehealth: Payer: Self-pay | Admitting: Pharmacist

## 2019-08-17 DIAGNOSIS — C9000 Multiple myeloma not having achieved remission: Secondary | ICD-10-CM

## 2019-08-17 MED ORDER — LENALIDOMIDE 10 MG PO CAPS: 10.0000 mg | ORAL_CAPSULE | Freq: Every day | ORAL | 0 refills | Status: DC

## 2019-08-17 NOTE — Telephone Encounter (Signed)
Oral Oncology Student Pharmacist Encounter  Received new prescription for Revlimid (Lenalidomide) for the treatment of multiple myeloma in conjunction with Dexamethasone, planned duration continue until disease progression or unacceptable toxicity occurs.  Labs from 08/17/19 assessed, GFR at 43, Scr at 1.58, and Hgb at 11, CrCl at 58. All others WNL.  Prescription dose and frequency assessed. CrCl is <60 so 76m is appropriate. Option to titrate to 18mafter 2 cycles if tolerating.   Current medication list in Epic reviewed, 1 relevant DDIs with lenalidomide identified: -Increased risk of DVT/PE with dexamethasone. Current VTE with clopidogrel.  Prescription has been e-scribed to the [employer] benefits analysis and approval.  Oral Oncology Clinic will continue to follow for insurance authorization, copayment issues, initial counseling and start date.  GrNed ClinesharmD Student, Class of [id number]/26/2021 1:54 PM

## 2019-08-17 NOTE — Telephone Encounter (Signed)
Oral Oncology Pharmacist Encounter   Received notification from [place] that prior authorization for Revlimid is required.   PA submitted on CMM  Key [id number] Status is pending   Oral Oncology Clinic will continue to follow.   Darl Pikes, PharmD, BCPS, [facility] Hematology/Oncology Clinical Pharmacist [facility] Oral [place] [phone]  08/17/2019 3:59 PM

## 2019-08-18 ENCOUNTER — Telehealth: Payer: Self-pay | Admitting: Hematology

## 2019-08-18 NOTE — Telephone Encounter (Signed)
No new los during check out. 

## 2019-08-18 NOTE — Telephone Encounter (Signed)
Oral Oncology Patient Advocate Encounter  Prior Authorization for Revlimid has been approved.    PA# [id number] Effective dates: 08/17/19 through 07/21/20    Oral Oncology Clinic will continue to follow.   [place] Patient [place] Phone [phone] Fax [phone] 08/18/2019 8:24 AM

## 2019-08-20 ENCOUNTER — Other Ambulatory Visit: Payer: Self-pay | Admitting: *Deleted

## 2019-08-20 ENCOUNTER — Other Ambulatory Visit: Payer: Medicare Other | Admitting: Internal Medicine

## 2019-08-20 DIAGNOSIS — C9000 Multiple myeloma not having achieved remission: Secondary | ICD-10-CM

## 2019-08-20 LAB — MULTIPLE MYELOMA PANEL, SERUM
Albumin SerPl Elph-Mcnc: 3.9 g/dL (ref 2.9–4.4)
Albumin/Glob SerPl: 0.8 (ref 0.7–1.7)
Alpha 1: 0.3 g/dL (ref 0.0–0.4)
Alpha2 Glob SerPl Elph-Mcnc: 0.9 g/dL (ref 0.4–1.0)
B-Globulin SerPl Elph-Mcnc: 0.9 g/dL (ref 0.7–1.3)
Gamma Glob SerPl Elph-Mcnc: 3.3 g/dL — ABNORMAL HIGH (ref 0.4–1.8)
Globulin, Total: 5.4 g/dL — ABNORMAL HIGH (ref 2.2–3.9)
IgA: <5 mg/dL — ABNORMAL LOW (ref 61–437)
IgG (Immunoglobin G), Serum: 4640 mg/dL — ABNORMAL HIGH (ref 603–1613)
IgM (Immunoglobulin M), Srm: <5 mg/dL — ABNORMAL LOW (ref 15–143)
M Protein SerPl Elph-Mcnc: 2.7 g/dL — ABNORMAL HIGH
Total Protein ELP: 9.3 g/dL — ABNORMAL HIGH (ref 6.0–8.5)

## 2019-08-23 ENCOUNTER — Inpatient Hospital Stay: Payer: Medicare Other | Admitting: Hematology

## 2019-08-23 ENCOUNTER — Other Ambulatory Visit: Payer: Self-pay

## 2019-08-23 ENCOUNTER — Inpatient Hospital Stay: Payer: Medicare Other | Attending: Hematology

## 2019-08-23 ENCOUNTER — Encounter: Payer: Medicare Other | Admitting: Internal Medicine

## 2019-08-23 ENCOUNTER — Inpatient Hospital Stay: Payer: Medicare Other

## 2019-08-23 VITALS — BP 120/76 | HR 99 | Temp 98.7°F | Resp 18 | Wt 214.8 lb

## 2019-08-23 DIAGNOSIS — Z5112 Encounter for antineoplastic immunotherapy: Secondary | ICD-10-CM | POA: Insufficient documentation

## 2019-08-23 DIAGNOSIS — Z7189 Other specified counseling: Secondary | ICD-10-CM

## 2019-08-23 DIAGNOSIS — C9000 Multiple myeloma not having achieved remission: Secondary | ICD-10-CM | POA: Diagnosis not present

## 2019-08-23 LAB — CBC WITH DIFFERENTIAL (CANCER CENTER ONLY)
Abs Immature Granulocytes: 0.02 10*3/uL (ref 0.00–0.07)
Basophils Absolute: 0 10*3/uL (ref 0.0–0.1)
Basophils Relative: 0 %
Eosinophils Absolute: 0 10*3/uL (ref 0.0–0.5)
Eosinophils Relative: 0 %
HCT: 32.9 % — ABNORMAL LOW (ref 39.0–52.0)
Hemoglobin: 11.1 g/dL — ABNORMAL LOW (ref 13.0–17.0)
Immature Granulocytes: 0 %
Lymphocytes Relative: 13 %
Lymphs Abs: 0.8 10*3/uL (ref 0.7–4.0)
MCH: 35.8 pg — ABNORMAL HIGH (ref 26.0–34.0)
MCHC: 33.7 g/dL (ref 30.0–36.0)
MCV: 106.1 fL — ABNORMAL HIGH (ref 80.0–100.0)
Monocytes Absolute: 0.1 10*3/uL (ref 0.1–1.0)
Monocytes Relative: 2 %
Neutro Abs: 4.8 10*3/uL (ref 1.7–7.7)
Neutrophils Relative %: 85 %
Platelet Count: 171 10*3/uL (ref 150–400)
RBC: 3.1 MIL/uL — ABNORMAL LOW (ref 4.22–5.81)
RDW: 14.1 % (ref 11.5–15.5)
WBC Count: 5.7 10*3/uL (ref 4.0–10.5)
nRBC: 0 % (ref 0.0–0.2)

## 2019-08-23 LAB — CMP (CANCER CENTER ONLY)
ALT: 15 U/L (ref 0–44)
AST: 22 U/L (ref 15–41)
Albumin: 3.5 g/dL (ref 3.5–5.0)
Alkaline Phosphatase: 74 U/L (ref 38–126)
Anion gap: 5 (ref 5–15)
BUN: 19 mg/dL (ref 8–23)
CO2: 24 mmol/L (ref 22–32)
Calcium: 8.7 mg/dL — ABNORMAL LOW (ref 8.9–10.3)
Chloride: 104 mmol/L (ref 98–111)
Creatinine: 1.66 mg/dL — ABNORMAL HIGH (ref 0.61–1.24)
GFR, Est AFR Am: 47 mL/min — ABNORMAL LOW (ref >60)
GFR, Estimated: 41 mL/min — ABNORMAL LOW (ref >60)
Glucose, Bld: 160 mg/dL — ABNORMAL HIGH (ref 70–99)
Potassium: 4.5 mmol/L (ref 3.5–5.1)
Sodium: 133 mmol/L — ABNORMAL LOW (ref 135–145)
Total Bilirubin: 0.5 mg/dL (ref 0.3–1.2)
Total Protein: 9.5 g/dL — ABNORMAL HIGH (ref 6.5–8.1)

## 2019-08-23 MED ORDER — PROCHLORPERAZINE MALEATE 10 MG PO TABS
10.0000 mg | ORAL_TABLET | Freq: Once | ORAL | Status: AC
  Administered 2019-08-23: 10 mg via ORAL

## 2019-08-23 MED ORDER — BORTEZOMIB CHEMO SQ INJECTION 3.5 MG (2.5MG/ML)
1.3000 mg/m2 | Freq: Once | INTRAMUSCULAR | Status: AC
  Administered 2019-08-23: 2.75 mg via SUBCUTANEOUS
  Filled 2019-08-23: qty 1.1

## 2019-08-23 MED ORDER — PROCHLORPERAZINE MALEATE 10 MG PO TABS
ORAL_TABLET | ORAL | Status: AC
  Filled 2019-08-23: qty 1

## 2019-08-23 NOTE — Progress Notes (Signed)
Okay to treat with creatinine 1.66, per Dr. Irene Limbo.

## 2019-08-26 ENCOUNTER — Ambulatory Visit: Payer: Medicare Other | Attending: Internal Medicine

## 2019-08-26 ENCOUNTER — Other Ambulatory Visit: Payer: Self-pay | Admitting: *Deleted

## 2019-08-26 ENCOUNTER — Telehealth: Payer: Self-pay

## 2019-08-26 DIAGNOSIS — Z23 Encounter for immunization: Secondary | ICD-10-CM | POA: Insufficient documentation

## 2019-08-26 DIAGNOSIS — C9000 Multiple myeloma not having achieved remission: Secondary | ICD-10-CM

## 2019-08-26 MED ORDER — LENALIDOMIDE 10 MG PO CAPS: 10.0000 mg | ORAL_CAPSULE | Freq: Every day | ORAL | 0 refills | Status: DC

## 2019-08-26 NOTE — Telephone Encounter (Signed)
Oral Chemotherapy Pharmacist Encounter   [facility] can not find the prescription e-scribed to them on 1/26. Prescription is being resent. [employer] contact said she would let us know it has been received.  Darl Pikes, PharmD, BCPS, [facility] Hematology/Oncology Clinical Pharmacist [facility] Oral [place] [facility] [phone]  08/26/2019 10:35 AM

## 2019-08-26 NOTE — Progress Notes (Signed)
   [id number] Vaccination Clinic  Name:  [name].    MRN: [id number] DOB: Sep 14, 1945  08/26/2019  Mr. [name] was observed post Covid-19 immunization for 15 minutes without incidence. He was provided with Vaccine Information Sheet and instruction to access the V-Safe system.   Mr. [name] was instructed to call 911 with any severe reactions post vaccine: Marland Kitchen Difficulty breathing  . Swelling of your face and throat  . A fast heartbeat  . A bad rash all over your body  . Dizziness and weakness    Immunizations Administered    Name Date Dose VIS Date Route   Pfizer COVID-19 Vaccine 08/26/2019  3:31 PM 0.3 mL 07/02/2019 Intramuscular   Manufacturer: [place]   Lot: [id number]   [place]: [id number]

## 2019-08-26 NOTE — Telephone Encounter (Signed)
Oral Oncology Patient Advocate Encounter   Was successful in securing patient a $11000 grant from [employer] (PAF) to provide copayment coverage for Revlimid.  This will keep the out of pocket expense at $0.     I have spoken with the patient.    The billing information is as follows and has been shared with [place].   RxBin: [id number] PCN:  [id number] Member ID: [id number]  Group ID: [id number] Dates of Eligibility: 08/26/19 through 08/25/20  [place] Patient [place] Phone [phone] Fax [phone] 08/26/2019 3:05 PM

## 2019-08-27 ENCOUNTER — Encounter (INDEPENDENT_AMBULATORY_CARE_PROVIDER_SITE_OTHER): Payer: Medicare Other | Admitting: Ophthalmology

## 2019-08-27 ENCOUNTER — Other Ambulatory Visit: Payer: Self-pay

## 2019-08-27 DIAGNOSIS — I10 Essential (primary) hypertension: Secondary | ICD-10-CM | POA: Diagnosis not present

## 2019-08-27 DIAGNOSIS — H33302 Unspecified retinal break, left eye: Secondary | ICD-10-CM | POA: Diagnosis not present

## 2019-08-27 DIAGNOSIS — H35033 Hypertensive retinopathy, bilateral: Secondary | ICD-10-CM

## 2019-08-27 DIAGNOSIS — H43813 Vitreous degeneration, bilateral: Secondary | ICD-10-CM

## 2019-08-30 ENCOUNTER — Other Ambulatory Visit: Payer: Medicare Other

## 2019-08-30 ENCOUNTER — Telehealth: Payer: Self-pay | Admitting: *Deleted

## 2019-08-30 ENCOUNTER — Ambulatory Visit: Payer: Medicare Other

## 2019-08-30 ENCOUNTER — Other Ambulatory Visit: Payer: Self-pay | Admitting: Hematology

## 2019-08-30 ENCOUNTER — Inpatient Hospital Stay: Payer: Medicare Other

## 2019-08-30 ENCOUNTER — Other Ambulatory Visit: Payer: Self-pay

## 2019-08-30 VITALS — BP 121/84 | HR 89 | Temp 97.8°F | Resp 16

## 2019-08-30 DIAGNOSIS — C9000 Multiple myeloma not having achieved remission: Secondary | ICD-10-CM | POA: Diagnosis not present

## 2019-08-30 DIAGNOSIS — Z5112 Encounter for antineoplastic immunotherapy: Secondary | ICD-10-CM | POA: Diagnosis not present

## 2019-08-30 DIAGNOSIS — Z7189 Other specified counseling: Secondary | ICD-10-CM

## 2019-08-30 LAB — CBC WITH DIFFERENTIAL (CANCER CENTER ONLY)
Abs Immature Granulocytes: 0.01 10*3/uL (ref 0.00–0.07)
Basophils Absolute: 0 10*3/uL (ref 0.0–0.1)
Basophils Relative: 0 %
Eosinophils Absolute: 0.1 10*3/uL (ref 0.0–0.5)
Eosinophils Relative: 2 %
HCT: 30.6 % — ABNORMAL LOW (ref 39.0–52.0)
Hemoglobin: 10.4 g/dL — ABNORMAL LOW (ref 13.0–17.0)
Immature Granulocytes: 0 %
Lymphocytes Relative: 15 %
Lymphs Abs: 0.9 10*3/uL (ref 0.7–4.0)
MCH: 35.4 pg — ABNORMAL HIGH (ref 26.0–34.0)
MCHC: 34 g/dL (ref 30.0–36.0)
MCV: 104.1 fL — ABNORMAL HIGH (ref 80.0–100.0)
Monocytes Absolute: 0.3 10*3/uL (ref 0.1–1.0)
Monocytes Relative: 5 %
Neutro Abs: 4.5 10*3/uL (ref 1.7–7.7)
Neutrophils Relative %: 78 %
Platelet Count: 177 10*3/uL (ref 150–400)
RBC: 2.94 MIL/uL — ABNORMAL LOW (ref 4.22–5.81)
RDW: 13.9 % (ref 11.5–15.5)
WBC Count: 5.8 10*3/uL (ref 4.0–10.5)
nRBC: 0 % (ref 0.0–0.2)

## 2019-08-30 LAB — CMP (CANCER CENTER ONLY)
ALT: 18 U/L (ref 0–44)
AST: 24 U/L (ref 15–41)
Albumin: 3.4 g/dL — ABNORMAL LOW (ref 3.5–5.0)
Alkaline Phosphatase: 75 U/L (ref 38–126)
Anion gap: 5 (ref 5–15)
BUN: 19 mg/dL (ref 8–23)
CO2: 24 mmol/L (ref 22–32)
Calcium: 8.8 mg/dL — ABNORMAL LOW (ref 8.9–10.3)
Chloride: 106 mmol/L (ref 98–111)
Creatinine: 1.39 mg/dL — ABNORMAL HIGH (ref 0.61–1.24)
GFR, Est AFR Am: 58 mL/min — ABNORMAL LOW (ref >60)
GFR, Estimated: 50 mL/min — ABNORMAL LOW (ref >60)
Glucose, Bld: 93 mg/dL (ref 70–99)
Potassium: 4.4 mmol/L (ref 3.5–5.1)
Sodium: 135 mmol/L (ref 135–145)
Total Bilirubin: 0.5 mg/dL (ref 0.3–1.2)
Total Protein: 9.2 g/dL — ABNORMAL HIGH (ref 6.5–8.1)

## 2019-08-30 MED ORDER — SODIUM CHLORIDE 0.9 % IV SOLN
60.0000 mg | Freq: Once | INTRAVENOUS | Status: AC
  Administered 2019-08-30: 60 mg via INTRAVENOUS
  Filled 2019-08-30: qty 10

## 2019-08-30 MED ORDER — PROCHLORPERAZINE MALEATE 10 MG PO TABS
ORAL_TABLET | ORAL | Status: AC
  Filled 2019-08-30: qty 1

## 2019-08-30 MED ORDER — SODIUM CHLORIDE 0.9 % IV SOLN
Freq: Once | INTRAVENOUS | Status: AC
  Filled 2019-08-30: qty 250

## 2019-08-30 MED ORDER — PROCHLORPERAZINE MALEATE 10 MG PO TABS
10.0000 mg | ORAL_TABLET | Freq: Once | ORAL | Status: AC
  Administered 2019-08-30: 12:00:00 10 mg via ORAL

## 2019-08-30 MED ORDER — BORTEZOMIB CHEMO SQ INJECTION 3.5 MG (2.5MG/ML)
1.3000 mg/m2 | Freq: Once | INTRAMUSCULAR | Status: AC
  Administered 2019-08-30: 2.75 mg via SUBCUTANEOUS
  Filled 2019-08-30: qty 1.1

## 2019-08-30 NOTE — Telephone Encounter (Signed)
Late entry for 08/27/19: Patient called to inform Dr. Irene Limbo that Revlimid will be delivered over weekend. He will start it on Monday. Dr. Irene Limbo informed.

## 2019-08-30 NOTE — Patient Instructions (Signed)
[facility] Discharge Instructions for Patients Receiving Chemotherapy  Today you received the following chemotherapy agents Velcade and Aredia.  To help prevent nausea and vomiting after your treatment, we encourage you to take your nausea medication as directed.   If you develop nausea and vomiting that is not controlled by your nausea medication, call the clinic.   BELOW ARE SYMPTOMS THAT SHOULD BE REPORTED IMMEDIATELY:  *FEVER GREATER THAN 100.5 F  *CHILLS WITH OR WITHOUT FEVER  NAUSEA AND VOMITING THAT IS NOT CONTROLLED WITH YOUR NAUSEA MEDICATION  *UNUSUAL SHORTNESS OF BREATH  *UNUSUAL BRUISING OR BLEEDING  TENDERNESS IN MOUTH AND THROAT WITH OR WITHOUT PRESENCE OF ULCERS  *URINARY PROBLEMS  *BOWEL PROBLEMS  UNUSUAL RASH Items with * indicate a potential emergency and should be followed up as soon as possible.  Feel free to call the clinic you have any questions or concerns. The clinic phone number is [phone].  Please show the [place] at check-in to the Emergency Department and triage nurse.

## 2019-08-30 NOTE — Telephone Encounter (Signed)
Refill requested

## 2019-08-30 NOTE — Progress Notes (Signed)
Per pharmacy Aredia to be ran over 4 hours due to diagnosis of multiple myeloma.

## 2019-09-02 ENCOUNTER — Other Ambulatory Visit: Payer: Self-pay | Admitting: *Deleted

## 2019-09-02 DIAGNOSIS — C9000 Multiple myeloma not having achieved remission: Secondary | ICD-10-CM

## 2019-09-06 ENCOUNTER — Inpatient Hospital Stay: Payer: Medicare Other

## 2019-09-06 ENCOUNTER — Inpatient Hospital Stay: Payer: Medicare Other | Admitting: Hematology

## 2019-09-06 ENCOUNTER — Other Ambulatory Visit: Payer: Self-pay

## 2019-09-06 VITALS — BP 128/74 | HR 90 | Temp 98.3°F | Resp 18 | Ht 70.0 in | Wt 216.4 lb

## 2019-09-06 DIAGNOSIS — Z5112 Encounter for antineoplastic immunotherapy: Secondary | ICD-10-CM | POA: Diagnosis not present

## 2019-09-06 DIAGNOSIS — Z5111 Encounter for antineoplastic chemotherapy: Secondary | ICD-10-CM | POA: Diagnosis not present

## 2019-09-06 DIAGNOSIS — C9000 Multiple myeloma not having achieved remission: Secondary | ICD-10-CM

## 2019-09-06 DIAGNOSIS — C7951 Secondary malignant neoplasm of bone: Secondary | ICD-10-CM | POA: Diagnosis not present

## 2019-09-06 DIAGNOSIS — Z7189 Other specified counseling: Secondary | ICD-10-CM

## 2019-09-06 LAB — CBC WITH DIFFERENTIAL (CANCER CENTER ONLY)
Abs Immature Granulocytes: 0.02 10*3/uL (ref 0.00–0.07)
Basophils Absolute: 0 10*3/uL (ref 0.0–0.1)
Basophils Relative: 0 %
Eosinophils Absolute: 0 10*3/uL (ref 0.0–0.5)
Eosinophils Relative: 0 %
HCT: 31 % — ABNORMAL LOW (ref 39.0–52.0)
Hemoglobin: 10.3 g/dL — ABNORMAL LOW (ref 13.0–17.0)
Immature Granulocytes: 1 %
Lymphocytes Relative: 16 %
Lymphs Abs: 0.7 10*3/uL (ref 0.7–4.0)
MCH: 35.2 pg — ABNORMAL HIGH (ref 26.0–34.0)
MCHC: 33.2 g/dL (ref 30.0–36.0)
MCV: 105.8 fL — ABNORMAL HIGH (ref 80.0–100.0)
Monocytes Absolute: 0.1 10*3/uL (ref 0.1–1.0)
Monocytes Relative: 2 %
Neutro Abs: 3.6 10*3/uL (ref 1.7–7.7)
Neutrophils Relative %: 81 %
Platelet Count: 175 10*3/uL (ref 150–400)
RBC: 2.93 MIL/uL — ABNORMAL LOW (ref 4.22–5.81)
RDW: 14 % (ref 11.5–15.5)
WBC Count: 4.4 10*3/uL (ref 4.0–10.5)
nRBC: 0 % (ref 0.0–0.2)

## 2019-09-06 LAB — CMP (CANCER CENTER ONLY)
ALT: 17 U/L (ref 0–44)
AST: 24 U/L (ref 15–41)
Albumin: 3.4 g/dL — ABNORMAL LOW (ref 3.5–5.0)
Alkaline Phosphatase: 77 U/L (ref 38–126)
Anion gap: 6 (ref 5–15)
BUN: 18 mg/dL (ref 8–23)
CO2: 23 mmol/L (ref 22–32)
Calcium: 8.5 mg/dL — ABNORMAL LOW (ref 8.9–10.3)
Chloride: 104 mmol/L (ref 98–111)
Creatinine: 1.51 mg/dL — ABNORMAL HIGH (ref 0.61–1.24)
GFR, Est AFR Am: 53 mL/min — ABNORMAL LOW (ref >60)
GFR, Estimated: 45 mL/min — ABNORMAL LOW (ref >60)
Glucose, Bld: 187 mg/dL — ABNORMAL HIGH (ref 70–99)
Potassium: 4.3 mmol/L (ref 3.5–5.1)
Sodium: 133 mmol/L — ABNORMAL LOW (ref 135–145)
Total Bilirubin: 0.5 mg/dL (ref 0.3–1.2)
Total Protein: 9 g/dL — ABNORMAL HIGH (ref 6.5–8.1)

## 2019-09-06 MED ORDER — PROCHLORPERAZINE MALEATE 10 MG PO TABS
10.0000 mg | ORAL_TABLET | Freq: Once | ORAL | Status: AC
  Administered 2019-09-06: 10 mg via ORAL

## 2019-09-06 MED ORDER — PROCHLORPERAZINE MALEATE 10 MG PO TABS
ORAL_TABLET | ORAL | Status: AC
  Filled 2019-09-06: qty 1

## 2019-09-06 MED ORDER — BORTEZOMIB CHEMO SQ INJECTION 3.5 MG (2.5MG/ML)
1.3000 mg/m2 | Freq: Once | INTRAMUSCULAR | Status: AC
  Administered 2019-09-06: 2.75 mg via SUBCUTANEOUS
  Filled 2019-09-06: qty 1.1

## 2019-09-06 NOTE — Patient Instructions (Signed)
[facility] Discharge Instructions for Patients Receiving Chemotherapy  Today you received the following chemotherapy agents:  bortezomib (Velcade)  To help prevent nausea and vomiting after your treatment, we encourage you to take your nausea medication as prescribed.   If you develop nausea and vomiting that is not controlled by your nausea medication, call the clinic.   BELOW ARE SYMPTOMS THAT SHOULD BE REPORTED IMMEDIATELY:  *FEVER GREATER THAN 100.5 F  *CHILLS WITH OR WITHOUT FEVER  NAUSEA AND VOMITING THAT IS NOT CONTROLLED WITH YOUR NAUSEA MEDICATION  *UNUSUAL SHORTNESS OF BREATH  *UNUSUAL BRUISING OR BLEEDING  TENDERNESS IN MOUTH AND THROAT WITH OR WITHOUT PRESENCE OF ULCERS  *URINARY PROBLEMS  *BOWEL PROBLEMS  UNUSUAL RASH Items with * indicate a potential emergency and should be followed up as soon as possible.  Feel free to call the clinic should you have any questions or concerns. The clinic phone number is [phone].  Please show the CHEMO ALERT CARD at check-in to the Emergency Department and triage nurse.   
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
## 2019-09-07 ENCOUNTER — Encounter: Payer: Self-pay | Admitting: Neurology

## 2019-09-07 ENCOUNTER — Ambulatory Visit: Payer: Medicare Other | Admitting: Neurology

## 2019-09-07 VITALS — BP 129/71 | HR 80 | Temp 97.3°F | Ht 70.0 in | Wt 218.5 lb

## 2019-09-07 DIAGNOSIS — I679 Cerebrovascular disease, unspecified: Secondary | ICD-10-CM | POA: Diagnosis not present

## 2019-09-07 MED ORDER — CLOPIDOGREL BISULFATE 75 MG PO TABS: 75.0000 mg | ORAL_TABLET | Freq: Every day | ORAL | 4 refills | Status: DC
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
## 2019-09-09 LAB — MULTIPLE MYELOMA PANEL, SERUM
Albumin SerPl Elph-Mcnc: 3.7 g/dL (ref 2.9–4.4)
Albumin/Glob SerPl: 0.9 (ref 0.7–1.7)
Alpha 1: 0.3 g/dL (ref 0.0–0.4)
Alpha2 Glob SerPl Elph-Mcnc: 0.8 g/dL (ref 0.4–1.0)
B-Globulin SerPl Elph-Mcnc: 0.8 g/dL (ref 0.7–1.3)
Gamma Glob SerPl Elph-Mcnc: 2.8 g/dL — ABNORMAL HIGH (ref 0.4–1.8)
Globulin, Total: 4.6 g/dL — ABNORMAL HIGH (ref 2.2–3.9)
IgA: 6 mg/dL — ABNORMAL LOW (ref 61–437)
IgG (Immunoglobin G), Serum: 3515 mg/dL — ABNORMAL HIGH (ref 603–1613)
IgM (Immunoglobulin M), Srm: <5 mg/dL — ABNORMAL LOW (ref 15–143)
M Protein SerPl Elph-Mcnc: 2.6 g/dL — ABNORMAL HIGH
Total Protein ELP: 8.3 g/dL (ref 6.0–8.5)

## 2019-09-10 ENCOUNTER — Other Ambulatory Visit: Payer: Self-pay | Admitting: *Deleted

## 2019-09-10 DIAGNOSIS — C9000 Multiple myeloma not having achieved remission: Secondary | ICD-10-CM

## 2019-09-13 ENCOUNTER — Inpatient Hospital Stay: Payer: Medicare Other

## 2019-09-13 ENCOUNTER — Other Ambulatory Visit: Payer: Self-pay | Admitting: *Deleted

## 2019-09-13 ENCOUNTER — Other Ambulatory Visit: Payer: Self-pay

## 2019-09-13 VITALS — BP 138/77 | HR 93 | Temp 98.7°F | Resp 16 | Wt 214.5 lb

## 2019-09-13 DIAGNOSIS — C9000 Multiple myeloma not having achieved remission: Secondary | ICD-10-CM

## 2019-09-13 DIAGNOSIS — Z7189 Other specified counseling: Secondary | ICD-10-CM

## 2019-09-13 DIAGNOSIS — Z5112 Encounter for antineoplastic immunotherapy: Secondary | ICD-10-CM | POA: Diagnosis not present

## 2019-09-13 LAB — CMP (CANCER CENTER ONLY)
ALT: 17 U/L (ref 0–44)
AST: 22 U/L (ref 15–41)
Albumin: 3.4 g/dL — ABNORMAL LOW (ref 3.5–5.0)
Alkaline Phosphatase: 78 U/L (ref 38–126)
Anion gap: 8 (ref 5–15)
BUN: 20 mg/dL (ref 8–23)
CO2: 23 mmol/L (ref 22–32)
Calcium: 8.6 mg/dL — ABNORMAL LOW (ref 8.9–10.3)
Chloride: 105 mmol/L (ref 98–111)
Creatinine: 1.42 mg/dL — ABNORMAL HIGH (ref 0.61–1.24)
GFR, Est AFR Am: 57 mL/min — ABNORMAL LOW (ref >60)
GFR, Estimated: 49 mL/min — ABNORMAL LOW (ref >60)
Glucose, Bld: 133 mg/dL — ABNORMAL HIGH (ref 70–99)
Potassium: 4.6 mmol/L (ref 3.5–5.1)
Sodium: 136 mmol/L (ref 135–145)
Total Bilirubin: 0.3 mg/dL (ref 0.3–1.2)
Total Protein: 8.6 g/dL — ABNORMAL HIGH (ref 6.5–8.1)

## 2019-09-13 LAB — CBC WITH DIFFERENTIAL (CANCER CENTER ONLY)
Abs Immature Granulocytes: 0.01 10*3/uL (ref 0.00–0.07)
Basophils Absolute: 0 10*3/uL (ref 0.0–0.1)
Basophils Relative: 1 %
Eosinophils Absolute: 0 10*3/uL (ref 0.0–0.5)
Eosinophils Relative: 1 %
HCT: 32.9 % — ABNORMAL LOW (ref 39.0–52.0)
Hemoglobin: 10.9 g/dL — ABNORMAL LOW (ref 13.0–17.0)
Immature Granulocytes: 0 %
Lymphocytes Relative: 27 %
Lymphs Abs: 1.1 10*3/uL (ref 0.7–4.0)
MCH: 34.8 pg — ABNORMAL HIGH (ref 26.0–34.0)
MCHC: 33.1 g/dL (ref 30.0–36.0)
MCV: 105.1 fL — ABNORMAL HIGH (ref 80.0–100.0)
Monocytes Absolute: 0.2 10*3/uL (ref 0.1–1.0)
Monocytes Relative: 4 %
Neutro Abs: 2.8 10*3/uL (ref 1.7–7.7)
Neutrophils Relative %: 67 %
Platelet Count: 165 10*3/uL (ref 150–400)
RBC: 3.13 MIL/uL — ABNORMAL LOW (ref 4.22–5.81)
RDW: 13.8 % (ref 11.5–15.5)
WBC Count: 4.1 10*3/uL (ref 4.0–10.5)
nRBC: 0 % (ref 0.0–0.2)

## 2019-09-13 MED ORDER — BORTEZOMIB CHEMO SQ INJECTION 3.5 MG (2.5MG/ML)
1.3000 mg/m2 | Freq: Once | INTRAMUSCULAR | Status: AC
  Administered 2019-09-13: 2.75 mg via SUBCUTANEOUS
  Filled 2019-09-13: qty 1.1

## 2019-09-13 MED ORDER — PROCHLORPERAZINE MALEATE 10 MG PO TABS: 10.0000 mg | ORAL_TABLET | Freq: Four times a day (QID) | ORAL | 1 refills | Status: DC | PRN

## 2019-09-13 MED ORDER — PROCHLORPERAZINE MALEATE 10 MG PO TABS
10.0000 mg | ORAL_TABLET | Freq: Once | ORAL | Status: AC
  Administered 2019-09-13: 10 mg via ORAL

## 2019-09-13 MED ORDER — PROCHLORPERAZINE MALEATE 10 MG PO TABS
ORAL_TABLET | ORAL | Status: AC
  Filled 2019-09-13: qty 1

## 2019-09-13 MED ORDER — LENALIDOMIDE 10 MG PO CAPS: 10.0000 mg | ORAL_CAPSULE | Freq: Every day | ORAL | 0 refills | Status: DC

## 2019-09-13 NOTE — Telephone Encounter (Signed)
Requested refill of prochlorperazine

## 2019-09-13 NOTE — Telephone Encounter (Signed)
  Patient requested refill of Revlimid  - Refilled per Dr. Irene Limbo OV note 09/06/19 Refill escribed to [facility] by Westley Gambles, [place] - [id number] [facility] Auth# [id number], 09/13/2019

## 2019-09-13 NOTE — Patient Instructions (Addendum)
[facility] Discharge Instructions for Patients Receiving Chemotherapy  Today you received the following chemotherapy agent: Bortezomib (Velcade)  To help prevent nausea and vomiting after your treatment, we encourage you to take your nausea medication as directed by your MD.   If you develop nausea and vomiting that is not controlled by your nausea medication, call the clinic.   BELOW ARE SYMPTOMS THAT SHOULD BE REPORTED IMMEDIATELY:  *FEVER GREATER THAN 100.5 F  *CHILLS WITH OR WITHOUT FEVER  NAUSEA AND VOMITING THAT IS NOT CONTROLLED WITH YOUR NAUSEA MEDICATION  *UNUSUAL SHORTNESS OF BREATH  *UNUSUAL BRUISING OR BLEEDING  TENDERNESS IN MOUTH AND THROAT WITH OR WITHOUT PRESENCE OF ULCERS  *URINARY PROBLEMS  *BOWEL PROBLEMS  UNUSUAL RASH Items with * indicate a potential emergency and should be followed up as soon as possible.  Feel free to call the clinic should you have any questions or concerns. The clinic phone number is [phone].  Please show the CHEMO ALERT CARD at check-in to the Emergency Department and triage nurse.  Coronavirus (COVID-19) Are you at risk?  Are you at risk for the Coronavirus (COVID-19)?  To be considered HIGH RISK for Coronavirus (COVID-19), you have to meet the following criteria:  . Traveled to [place], [place], [place], [place] or [place]; or in the [place] to [place], [place], [place], or [place]; and have fever, cough, and shortness of breath within the last 2 weeks of travel OR . Been in close contact with a person diagnosed with COVID-19 within the last 2 weeks and have fever, cough, and shortness of breath . IF YOU DO NOT MEET THESE CRITERIA, YOU ARE CONSIDERED LOW RISK FOR COVID-19.  What to do if you are HIGH RISK for COVID-19?  . If you are having a medical emergency, call 911. . Seek medical care right away. Before you go to a doctor's office, urgent care or emergency department, call ahead and tell  them about your recent travel, contact with someone diagnosed with COVID-19, and your symptoms. You should receive instructions from your physician's office regarding next steps of care.  . When you arrive at healthcare provider, tell the healthcare staff immediately you have returned from visiting [place], [place], [place], [place] or [place]; or traveled in the [place] to [place], [place], [place], or [place]; in the last two weeks or you have been in close contact with a person diagnosed with COVID-19 in the last 2 weeks.   . Tell the health care staff about your symptoms: fever, cough and shortness of breath. . After you have been seen by a medical provider, you will be either: o Tested for (COVID-19) and discharged home on quarantine except to seek medical care if symptoms worsen, and asked to  - Stay home and avoid contact with others until you get your results (4-5 days)  - Avoid travel on public transportation if possible (such as bus, train, or airplane) or o Sent to the Emergency Department by EMS for evaluation, COVID-19 testing, and possible admission depending on your condition and test results.  What to do if you are LOW RISK for COVID-19?  Reduce your risk of any infection by using the same precautions used for avoiding the common cold or flu:  . Wash your hands often with soap and warm water for at least 20 seconds.  If soap and water are not readily available, use an alcohol-based hand sanitizer with at least 60% alcohol.  .   If coughing or sneezing, cover your mouth and nose by coughing or sneezing into the elbow areas of your shirt or coat, into a tissue or into your sleeve (not your hands). . Avoid shaking hands with others and consider head nods or verbal greetings only. . Avoid touching your eyes, nose, or mouth with unwashed hands.  . Avoid close contact with people who are sick. . Avoid places or events with large numbers of people in one location, like concerts or  sporting events. . Carefully consider travel plans you have or are making. . If you are planning any travel outside or inside the [place], visit the CDC's Travelers' Health webpage for the latest health notices. . If you have some symptoms but not all symptoms, continue to monitor at home and seek medical attention if your symptoms worsen. . If you are having a medical emergency, call 911.   ADDITIONAL HEALTHCARE OPTIONS FOR PATIENTS  [facility] / e-Visit: [email or web address]         [facility]: [phone]  [facility]: [phone]                   [facility]: [phone]  

## 2019-09-13 NOTE — Progress Notes (Signed)
Pt. took Dexamethazone 40 mg po today at home prior to arrival for treatment.[name]

## 2019-09-14 ENCOUNTER — Other Ambulatory Visit: Payer: Self-pay | Admitting: Hematology

## 2019-09-17 ENCOUNTER — Other Ambulatory Visit: Payer: Self-pay

## 2019-09-17 DIAGNOSIS — C9000 Multiple myeloma not having achieved remission: Secondary | ICD-10-CM

## 2019-09-18 ENCOUNTER — Other Ambulatory Visit: Payer: Self-pay | Admitting: Hematology

## 2019-09-18 DIAGNOSIS — Z7189 Other specified counseling: Secondary | ICD-10-CM

## 2019-09-18 DIAGNOSIS — C9000 Multiple myeloma not having achieved remission: Secondary | ICD-10-CM

## 2019-09-20 ENCOUNTER — Other Ambulatory Visit: Payer: Self-pay

## 2019-09-20 ENCOUNTER — Inpatient Hospital Stay: Payer: Medicare Other | Attending: Hematology

## 2019-09-20 ENCOUNTER — Inpatient Hospital Stay: Payer: Medicare Other

## 2019-09-20 ENCOUNTER — Inpatient Hospital Stay: Payer: Medicare Other | Admitting: Hematology

## 2019-09-20 VITALS — BP 120/74 | HR 96 | Temp 98.7°F | Resp 18 | Ht 70.0 in | Wt 219.1 lb

## 2019-09-20 DIAGNOSIS — C9000 Multiple myeloma not having achieved remission: Secondary | ICD-10-CM

## 2019-09-20 DIAGNOSIS — Z7189 Other specified counseling: Secondary | ICD-10-CM

## 2019-09-20 DIAGNOSIS — Z79899 Other long term (current) drug therapy: Secondary | ICD-10-CM | POA: Insufficient documentation

## 2019-09-20 DIAGNOSIS — C7951 Secondary malignant neoplasm of bone: Secondary | ICD-10-CM

## 2019-09-20 DIAGNOSIS — Z5112 Encounter for antineoplastic immunotherapy: Secondary | ICD-10-CM | POA: Insufficient documentation

## 2019-09-20 LAB — CMP (CANCER CENTER ONLY)
ALT: 13 U/L (ref 0–44)
AST: 23 U/L (ref 15–41)
Albumin: 3.4 g/dL — ABNORMAL LOW (ref 3.5–5.0)
Alkaline Phosphatase: 72 U/L (ref 38–126)
Anion gap: 7 (ref 5–15)
BUN: 18 mg/dL (ref 8–23)
CO2: 23 mmol/L (ref 22–32)
Calcium: 8.6 mg/dL — ABNORMAL LOW (ref 8.9–10.3)
Chloride: 106 mmol/L (ref 98–111)
Creatinine: 1.49 mg/dL — ABNORMAL HIGH (ref 0.61–1.24)
GFR, Est AFR Am: 54 mL/min — ABNORMAL LOW (ref >60)
GFR, Estimated: 46 mL/min — ABNORMAL LOW (ref >60)
Glucose, Bld: 211 mg/dL — ABNORMAL HIGH (ref 70–99)
Potassium: 4.3 mmol/L (ref 3.5–5.1)
Sodium: 136 mmol/L (ref 135–145)
Total Bilirubin: 0.5 mg/dL (ref 0.3–1.2)
Total Protein: 8.4 g/dL — ABNORMAL HIGH (ref 6.5–8.1)

## 2019-09-20 LAB — CBC WITH DIFFERENTIAL (CANCER CENTER ONLY)
Abs Immature Granulocytes: 0.01 10*3/uL (ref 0.00–0.07)
Basophils Absolute: 0 10*3/uL (ref 0.0–0.1)
Basophils Relative: 1 %
Eosinophils Absolute: 0 10*3/uL (ref 0.0–0.5)
Eosinophils Relative: 0 %
HCT: 32.2 % — ABNORMAL LOW (ref 39.0–52.0)
Hemoglobin: 10.7 g/dL — ABNORMAL LOW (ref 13.0–17.0)
Immature Granulocytes: 0 %
Lymphocytes Relative: 18 %
Lymphs Abs: 0.7 10*3/uL (ref 0.7–4.0)
MCH: 34.9 pg — ABNORMAL HIGH (ref 26.0–34.0)
MCHC: 33.2 g/dL (ref 30.0–36.0)
MCV: 104.9 fL — ABNORMAL HIGH (ref 80.0–100.0)
Monocytes Absolute: 0.1 10*3/uL (ref 0.1–1.0)
Monocytes Relative: 2 %
Neutro Abs: 3.2 10*3/uL (ref 1.7–7.7)
Neutrophils Relative %: 79 %
Platelet Count: 198 10*3/uL (ref 150–400)
RBC: 3.07 MIL/uL — ABNORMAL LOW (ref 4.22–5.81)
RDW: 13.8 % (ref 11.5–15.5)
WBC Count: 4 10*3/uL (ref 4.0–10.5)
nRBC: 0 % (ref 0.0–0.2)

## 2019-09-20 MED ORDER — PROCHLORPERAZINE MALEATE 10 MG PO TABS
ORAL_TABLET | ORAL | Status: AC
  Filled 2019-09-20: qty 1

## 2019-09-20 MED ORDER — BORTEZOMIB CHEMO SQ INJECTION 3.5 MG (2.5MG/ML)
1.3000 mg/m2 | Freq: Once | INTRAMUSCULAR | Status: AC
  Administered 2019-09-20: 2.75 mg via SUBCUTANEOUS
  Filled 2019-09-20: qty 1.1

## 2019-09-20 MED ORDER — PROCHLORPERAZINE MALEATE 10 MG PO TABS
10.0000 mg | ORAL_TABLET | Freq: Once | ORAL | Status: AC
  Administered 2019-09-20: 10 mg via ORAL
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
## 2019-09-20 NOTE — Telephone Encounter (Signed)
Refill requested

## 2019-09-20 NOTE — Patient Instructions (Signed)
[facility] Discharge Instructions for Patients Receiving Chemotherapy  Today you received the following Immunotherapy agent: Bortezomib (Velcade)  To help prevent nausea and vomiting after your treatment, we encourage you to take your nausea medication as directed by your MD.   If you develop nausea and vomiting that is not controlled by your nausea medication, call the clinic.   BELOW ARE SYMPTOMS THAT SHOULD BE REPORTED IMMEDIATELY:  *FEVER GREATER THAN 100.5 F  *CHILLS WITH OR WITHOUT FEVER  NAUSEA AND VOMITING THAT IS NOT CONTROLLED WITH YOUR NAUSEA MEDICATION  *UNUSUAL SHORTNESS OF BREATH  *UNUSUAL BRUISING OR BLEEDING  TENDERNESS IN MOUTH AND THROAT WITH OR WITHOUT PRESENCE OF ULCERS  *URINARY PROBLEMS  *BOWEL PROBLEMS  UNUSUAL RASH Items with * indicate a potential emergency and should be followed up as soon as possible.  Feel free to call the clinic should you have any questions or concerns. The clinic phone number is [phone].  Please show the [facility] at check-in to the Emergency Department and triage nurse.  Coronavirus (COVID-19) Are you at risk?  Are you at risk for the Coronavirus (COVID-19)?  To be considered HIGH RISK for Coronavirus (COVID-19), you have to meet the following criteria:  . Traveled to [place], [place], [place], [place] or [place]; or in the [place] to [place], [place], [place], or [place]; and have fever, cough, and shortness of breath within the last 2 weeks of travel OR . Been in close contact with a person diagnosed with COVID-19 within the last 2 weeks and have fever, cough, and shortness of breath . IF YOU DO NOT MEET THESE CRITERIA, YOU ARE CONSIDERED LOW RISK FOR COVID-19.  What to do if you are HIGH RISK for COVID-19?  Marland Kitchen If you are having a medical emergency, call 911. . Seek medical care right away. Before you go to a doctor's office, urgent care or emergency department, call ahead and  tell them about your recent travel, contact with someone diagnosed with COVID-19, and your symptoms. You should receive instructions from your physician's office regarding next steps of care.  . When you arrive at healthcare provider, tell the healthcare staff immediately you have returned from visiting [place], [place], [place], [place] or [place]; or traveled in the [place] to [place], [place], [place], or [place]; in the last two weeks or you have been in close contact with a person diagnosed with COVID-19 in the last 2 weeks.   . Tell the health care staff about your symptoms: fever, cough and shortness of breath. . After you have been seen by a medical provider, you will be either: o Tested for (COVID-19) and discharged home on quarantine except to seek medical care if symptoms worsen, and asked to  - Stay home and avoid contact with others until you get your results (4-5 days)  - Avoid travel on public transportation if possible (such as bus, train, or airplane) or o Sent to the Emergency Department by EMS for evaluation, COVID-19 testing, and possible admission depending on your condition and test results.  What to do if you are LOW RISK for COVID-19?  Reduce your risk of any infection by using the same precautions used for avoiding the common cold or flu:  Marland Kitchen Wash your hands often with soap and warm water for at least 20 seconds.  If soap and water are not readily available, use an alcohol-based hand sanitizer with at least 60% alcohol.  Marland Kitchen  If coughing or sneezing, cover your mouth and nose by coughing or sneezing into the elbow areas of your shirt or coat, into a tissue or into your sleeve (not your hands). . Avoid shaking hands with others and consider head nods or verbal greetings only. . Avoid touching your eyes, nose, or mouth with unwashed hands.  . Avoid close contact with people who are sick. . Avoid places or events with large numbers of people in one location, like  concerts or sporting events. . Carefully consider travel plans you have or are making. . If you are planning any travel outside or inside the [place], visit the CDC's Travelers' Health webpage for the latest health notices. . If you have some symptoms but not all symptoms, continue to monitor at home and seek medical attention if your symptoms worsen. . If you are having a medical emergency, call 911.   ADDITIONAL HEALTHCARE OPTIONS FOR PATIENTS  [facility] / e-Visit: [email or web address]         [facility]: [phone]  [facility]: [phone]                   [facility]: [phone]   

## 2019-09-21 MED ORDER — ERYTHROMYCIN 5 MG/GM OP OINT: TOPICAL_OINTMENT | OPHTHALMIC | 1 refills | Status: DC

## 2019-09-23 ENCOUNTER — Telehealth: Payer: Self-pay | Admitting: *Deleted

## 2019-09-23 ENCOUNTER — Other Ambulatory Visit: Payer: Self-pay | Admitting: Hematology

## 2019-09-23 MED ORDER — DOXYCYCLINE HYCLATE 100 MG PO TABS: 100.0000 mg | ORAL_TABLET | Freq: Two times a day (BID) | ORAL | 0 refills | Status: DC

## 2019-09-23 NOTE — Telephone Encounter (Signed)
Patient called. Using antibiotic ointment 2 x/day as ordered for left eye stye. It is not better - selling is worse and it is painful.  Dr. Irene Limbo informed. Dr. Irene Limbo prescribed oral antibiotic for patient and asked if he has been washing it gently and using warm soaks. He recommends patient contact his eye doctor for further evaluation. If he does not have an eye doctor, Dr. Irene Limbo will provide referral. Contacted patient. States he tried to wash it on Monday afternoon and the soap burned really badly so he has not repeated the washing. Has not been using warm soaks - instructed patient to apply warm wet washcloth several times a day for comfort. Informed him that po antibiotic was prescribed and Rx sent to pharmacy. Informed him that Dr. Irene Limbo recommends he contact his eye doctor if he has one - he says he does and will call. Patient verbalized understanding of all information/instructions.

## 2019-09-27 ENCOUNTER — Inpatient Hospital Stay: Payer: Medicare Other

## 2019-09-27 ENCOUNTER — Other Ambulatory Visit: Payer: Self-pay

## 2019-09-27 VITALS — BP 131/74 | HR 69 | Temp 98.3°F | Resp 17 | Wt 216.5 lb

## 2019-09-27 DIAGNOSIS — Z7189 Other specified counseling: Secondary | ICD-10-CM

## 2019-09-27 DIAGNOSIS — C9000 Multiple myeloma not having achieved remission: Secondary | ICD-10-CM

## 2019-09-27 DIAGNOSIS — Z79899 Other long term (current) drug therapy: Secondary | ICD-10-CM | POA: Diagnosis not present

## 2019-09-27 DIAGNOSIS — C7951 Secondary malignant neoplasm of bone: Secondary | ICD-10-CM

## 2019-09-27 DIAGNOSIS — Z5112 Encounter for antineoplastic immunotherapy: Secondary | ICD-10-CM | POA: Diagnosis not present

## 2019-09-27 LAB — CMP (CANCER CENTER ONLY)
ALT: 14 U/L (ref 0–44)
AST: 25 U/L (ref 15–41)
Albumin: 3.4 g/dL — ABNORMAL LOW (ref 3.5–5.0)
Alkaline Phosphatase: 71 U/L (ref 38–126)
Anion gap: 5 (ref 5–15)
BUN: 16 mg/dL (ref 8–23)
CO2: 25 mmol/L (ref 22–32)
Calcium: 8.7 mg/dL — ABNORMAL LOW (ref 8.9–10.3)
Chloride: 108 mmol/L (ref 98–111)
Creatinine: 1.4 mg/dL — ABNORMAL HIGH (ref 0.61–1.24)
GFR, Est AFR Am: 58 mL/min — ABNORMAL LOW (ref >60)
GFR, Estimated: 50 mL/min — ABNORMAL LOW (ref >60)
Glucose, Bld: 82 mg/dL (ref 70–99)
Potassium: 4.2 mmol/L (ref 3.5–5.1)
Sodium: 138 mmol/L (ref 135–145)
Total Bilirubin: 0.5 mg/dL (ref 0.3–1.2)
Total Protein: 7.6 g/dL (ref 6.5–8.1)

## 2019-09-27 LAB — CBC WITH DIFFERENTIAL/PLATELET
Abs Immature Granulocytes: 0.01 10*3/uL (ref 0.00–0.07)
Basophils Absolute: 0.1 10*3/uL (ref 0.0–0.1)
Basophils Relative: 1 %
Eosinophils Absolute: 0.3 10*3/uL (ref 0.0–0.5)
Eosinophils Relative: 7 %
HCT: 31.4 % — ABNORMAL LOW (ref 39.0–52.0)
Hemoglobin: 10.5 g/dL — ABNORMAL LOW (ref 13.0–17.0)
Immature Granulocytes: 0 %
Lymphocytes Relative: 45 %
Lymphs Abs: 2.1 10*3/uL (ref 0.7–4.0)
MCH: 34.5 pg — ABNORMAL HIGH (ref 26.0–34.0)
MCHC: 33.4 g/dL (ref 30.0–36.0)
MCV: 103.3 fL — ABNORMAL HIGH (ref 80.0–100.0)
Monocytes Absolute: 0.4 10*3/uL (ref 0.1–1.0)
Monocytes Relative: 10 %
Neutro Abs: 1.7 10*3/uL (ref 1.7–7.7)
Neutrophils Relative %: 37 %
Platelets: 183 10*3/uL (ref 150–400)
RBC: 3.04 MIL/uL — ABNORMAL LOW (ref 4.22–5.81)
RDW: 14.1 % (ref 11.5–15.5)
WBC: 4.7 10*3/uL (ref 4.0–10.5)
nRBC: 0 % (ref 0.0–0.2)

## 2019-09-27 MED ORDER — PROCHLORPERAZINE MALEATE 10 MG PO TABS
10.0000 mg | ORAL_TABLET | Freq: Once | ORAL | Status: AC
  Administered 2019-09-27: 10 mg via ORAL

## 2019-09-27 MED ORDER — BORTEZOMIB CHEMO SQ INJECTION 3.5 MG (2.5MG/ML)
1.3000 mg/m2 | Freq: Once | INTRAMUSCULAR | Status: AC
  Administered 2019-09-27: 2.75 mg via SUBCUTANEOUS
  Filled 2019-09-27: qty 1.1

## 2019-09-27 MED ORDER — SODIUM CHLORIDE 0.9 % IV SOLN
60.0000 mg | Freq: Once | INTRAVENOUS | Status: AC
  Administered 2019-09-27: 60 mg via INTRAVENOUS
  Filled 2019-09-27: qty 10

## 2019-09-27 MED ORDER — SODIUM CHLORIDE 0.9 % IV SOLN
Freq: Once | INTRAVENOUS | Status: AC
  Filled 2019-09-27: qty 250

## 2019-09-27 MED ORDER — PROCHLORPERAZINE MALEATE 10 MG PO TABS
ORAL_TABLET | ORAL | Status: AC
  Filled 2019-09-27: qty 1

## 2019-09-27 NOTE — Patient Instructions (Signed)
[facility] Discharge Instructions for Patients Receiving Chemotherapy  Today you received the following chemotherapy agents: Velcade  To help prevent nausea and vomiting after your treatment, we encourage you to take your nausea medication as directed.   If you develop nausea and vomiting that is not controlled by your nausea medication, call the clinic.   BELOW ARE SYMPTOMS THAT SHOULD BE REPORTED IMMEDIATELY:  *FEVER GREATER THAN 100.5 F  *CHILLS WITH OR WITHOUT FEVER  NAUSEA AND VOMITING THAT IS NOT CONTROLLED WITH YOUR NAUSEA MEDICATION  *UNUSUAL SHORTNESS OF BREATH  *UNUSUAL BRUISING OR BLEEDING  TENDERNESS IN MOUTH AND THROAT WITH OR WITHOUT PRESENCE OF ULCERS  *URINARY PROBLEMS  *BOWEL PROBLEMS  UNUSUAL RASH Items with * indicate a potential emergency and should be followed up as soon as possible.  Feel free to call the clinic should you have any questions or concerns. The clinic phone number is [phone].  Please show the [facility] at check-in to the Emergency Department and triage nurse.  Pamidronate injection What is this medicine? PAMIDRONATE (pa mi DROE nate) slows calcium loss from bones. It is used to treat high calcium blood levels from cancer or Paget's disease. It is also used to treat bone pain and prevent fractures from certain cancers that have spread to the bone. This medicine may be used for other purposes; ask your health care provider or pharmacist if you have questions. COMMON BRAND NAME(S): Aredia What should I tell my health care provider before I take this medicine? They need to know if you have any of these conditions:  aspirin-sensitive asthma  dental disease  kidney disease  an unusual or allergic reaction to pamidronate, other medicines, foods, dyes, or preservatives  pregnant or trying to get pregnant  breast-feeding How should I use this medicine? This medicine is for infusion into a vein. It is given  by a health care professional in a hospital or clinic setting. Talk to your pediatrician regarding the use of this medicine in children. This medicine is not approved for use in children. Overdosage: If you think you have taken too much of this medicine contact a poison control center or emergency room at once. NOTE: This medicine is only for you. Do not share this medicine with others. What if I miss a dose? This does not apply. What may interact with this medicine?  certain antibiotics given by injection  medicines for inflammation or pain like ibuprofen, naproxen  some diuretics like bumetanide, furosemide  cyclosporine  parathyroid hormone  tacrolimus  teriparatide  thalidomide This list may not describe all possible interactions. Give your health care provider a list of all the medicines, herbs, non-prescription drugs, or dietary supplements you use. Also tell them if you smoke, drink alcohol, or use illegal drugs. Some items may interact with your medicine. What should I watch for while using this medicine? Visit your doctor or health care professional for regular checkups. It may be some time before you see the benefit from this medicine. Do not stop taking your medicine unless your doctor tells you to. Your doctor may order blood tests or other tests to see how you are doing. Women should inform their doctor if they wish to become pregnant or think they might be pregnant. There is a potential for serious side effects to an unborn child. Talk to your health care professional or pharmacist for more information. You should make sure that you get enough calcium and vitamin D while you are  taking this medicine. Discuss the foods you eat and the vitamins you take with your health care professional. Some people who take this medicine have severe bone, joint, and/or muscle pain. This medicine may also increase your risk for a broken thigh bone. Tell your doctor right away if you have pain  in your upper leg or groin. Tell your doctor if you have any pain that does not go away or that gets worse. What side effects may I notice from receiving this medicine? Side effects that you should report to your doctor or health care professional as soon as possible:  allergic reactions like skin rash, itching or hives, swelling of the face, lips, or tongue  black or tarry stools  changes in vision  eye inflammation, pain  high blood pressure  jaw pain, especially burning or cramping  muscle weakness  numb, tingling pain  swelling of feet or hands  trouble passing urine or change in the amount of urine  unable to move easily Side effects that usually do not require medical attention (report to your doctor or health care professional if they continue or are bothersome):  bone, joint, or muscle pain  constipation  dizzy, drowsy  fever  headache  loss of appetite  nausea, vomiting  pain at site where injected This list may not describe all possible side effects. Call your doctor for medical advice about side effects. You may report side effects to FDA at 1-800-FDA-1088. Where should I keep my medicine? This drug is given in a hospital or clinic and will not be stored at home. NOTE: This sheet is a summary. It may not cover all possible information. If you have questions about this medicine, talk to your doctor, pharmacist, or health care provider.  2020 [employer]vier/Gold Standard (2011-01-04 08:49:49)

## 2019-09-29 ENCOUNTER — Telehealth: Payer: Self-pay

## 2019-09-29 NOTE — Telephone Encounter (Signed)
Received call from patient wanting to know if his wife can be present for his next MD visit with Dr. Irene Limbo. Advised patient that wife can come in with him for MD visit but can not go in to the treatment area. Also informed him that she would have to take a travel screen as well before coming in. Patient was agreeable with this. Confirmed next appts. date with patient and he verbalized agreement.

## 2019-10-04 ENCOUNTER — Other Ambulatory Visit: Payer: Self-pay

## 2019-10-04 ENCOUNTER — Inpatient Hospital Stay: Payer: Medicare Other

## 2019-10-04 ENCOUNTER — Other Ambulatory Visit: Payer: Self-pay | Admitting: Hematology

## 2019-10-04 VITALS — BP 113/59 | HR 68 | Temp 98.5°F | Resp 18 | Wt 215.8 lb

## 2019-10-04 DIAGNOSIS — C9000 Multiple myeloma not having achieved remission: Secondary | ICD-10-CM

## 2019-10-04 DIAGNOSIS — C7951 Secondary malignant neoplasm of bone: Secondary | ICD-10-CM

## 2019-10-04 DIAGNOSIS — Z7189 Other specified counseling: Secondary | ICD-10-CM

## 2019-10-04 DIAGNOSIS — Z79899 Other long term (current) drug therapy: Secondary | ICD-10-CM | POA: Diagnosis not present

## 2019-10-04 DIAGNOSIS — Z5112 Encounter for antineoplastic immunotherapy: Secondary | ICD-10-CM | POA: Diagnosis not present

## 2019-10-04 LAB — CMP (CANCER CENTER ONLY)
ALT: 14 U/L (ref 0–44)
AST: 25 U/L (ref 15–41)
Albumin: 3.6 g/dL (ref 3.5–5.0)
Alkaline Phosphatase: 70 U/L (ref 38–126)
Anion gap: 8 (ref 5–15)
BUN: 20 mg/dL (ref 8–23)
CO2: 24 mmol/L (ref 22–32)
Calcium: 9 mg/dL (ref 8.9–10.3)
Chloride: 106 mmol/L (ref 98–111)
Creatinine: 1.42 mg/dL — ABNORMAL HIGH (ref 0.61–1.24)
GFR, Est AFR Am: 57 mL/min — ABNORMAL LOW (ref >60)
GFR, Estimated: 49 mL/min — ABNORMAL LOW (ref >60)
Glucose, Bld: 122 mg/dL — ABNORMAL HIGH (ref 70–99)
Potassium: 4.4 mmol/L (ref 3.5–5.1)
Sodium: 138 mmol/L (ref 135–145)
Total Bilirubin: 0.5 mg/dL (ref 0.3–1.2)
Total Protein: 8 g/dL (ref 6.5–8.1)

## 2019-10-04 LAB — CBC WITH DIFFERENTIAL/PLATELET
Abs Immature Granulocytes: 0.01 10*3/uL (ref 0.00–0.07)
Basophils Absolute: 0.1 10*3/uL (ref 0.0–0.1)
Basophils Relative: 1 %
Eosinophils Absolute: 0.1 10*3/uL (ref 0.0–0.5)
Eosinophils Relative: 2 %
HCT: 33.1 % — ABNORMAL LOW (ref 39.0–52.0)
Hemoglobin: 11 g/dL — ABNORMAL LOW (ref 13.0–17.0)
Immature Granulocytes: 0 %
Lymphocytes Relative: 24 %
Lymphs Abs: 0.8 10*3/uL (ref 0.7–4.0)
MCH: 34.4 pg — ABNORMAL HIGH (ref 26.0–34.0)
MCHC: 33.2 g/dL (ref 30.0–36.0)
MCV: 103.4 fL — ABNORMAL HIGH (ref 80.0–100.0)
Monocytes Absolute: 0.2 10*3/uL (ref 0.1–1.0)
Monocytes Relative: 6 %
Neutro Abs: 2.4 10*3/uL (ref 1.7–7.7)
Neutrophils Relative %: 67 %
Platelets: 155 10*3/uL (ref 150–400)
RBC: 3.2 MIL/uL — ABNORMAL LOW (ref 4.22–5.81)
RDW: 13.6 % (ref 11.5–15.5)
WBC: 3.5 10*3/uL — ABNORMAL LOW (ref 4.0–10.5)
nRBC: 0 % (ref 0.0–0.2)

## 2019-10-04 LAB — VITAMIN D 25 HYDROXY (VIT D DEFICIENCY, FRACTURES): Vit D, 25-Hydroxy: 52.3 ng/mL (ref 30–100)

## 2019-10-04 MED ORDER — BORTEZOMIB CHEMO SQ INJECTION 3.5 MG (2.5MG/ML)
1.3000 mg/m2 | Freq: Once | INTRAMUSCULAR | Status: AC
  Administered 2019-10-04: 2.75 mg via SUBCUTANEOUS
  Filled 2019-10-04: qty 1.1

## 2019-10-04 MED ORDER — PROCHLORPERAZINE MALEATE 10 MG PO TABS
10.0000 mg | ORAL_TABLET | Freq: Once | ORAL | Status: AC
  Administered 2019-10-04: 10 mg via ORAL

## 2019-10-04 MED ORDER — PROCHLORPERAZINE MALEATE 10 MG PO TABS
ORAL_TABLET | ORAL | Status: AC
  Filled 2019-10-04: qty 1

## 2019-10-04 NOTE — Patient Instructions (Signed)
[facility] Discharge Instructions for Patients Receiving Chemotherapy  Today you received the following chemotherapy agents: Bortezomib (VELCADE).  To help prevent nausea and vomiting after your treatment, we encourage you to take your nausea medication as prescribed.   If you develop nausea and vomiting that is not controlled by your nausea medication, call the clinic.   BELOW ARE SYMPTOMS THAT SHOULD BE REPORTED IMMEDIATELY:  *FEVER GREATER THAN 100.5 F  *CHILLS WITH OR WITHOUT FEVER  NAUSEA AND VOMITING THAT IS NOT CONTROLLED WITH YOUR NAUSEA MEDICATION  *UNUSUAL SHORTNESS OF BREATH  *UNUSUAL BRUISING OR BLEEDING  TENDERNESS IN MOUTH AND THROAT WITH OR WITHOUT PRESENCE OF ULCERS  *URINARY PROBLEMS  *BOWEL PROBLEMS  UNUSUAL RASH Items with * indicate a potential emergency and should be followed up as soon as possible.  Feel free to call the clinic should you have any questions or concerns. The clinic phone number is [phone].  Please show the CHEMO ALERT CARD at check-in to the Emergency Department and triage nurse.   Coronavirus (COVID-19) Are you at risk?  Are you at risk for the Coronavirus (COVID-19)?  To be considered HIGH RISK for Coronavirus (COVID-19), you have to meet the following criteria:  . Traveled to [place], [place], [place], [place] or [place]; or in the [place] to [place], [place], [place], or [place]; and have fever, cough, and shortness of breath within the last 2 weeks of travel OR . Been in close contact with a person diagnosed with COVID-19 within the last 2 weeks and have fever, cough, and shortness of breath . IF YOU DO NOT MEET THESE CRITERIA, YOU ARE CONSIDERED LOW RISK FOR COVID-19.  What to do if you are HIGH RISK for COVID-19?  . If you are having a medical emergency, call 911. . Seek medical care right away. Before you go to a doctor's office, urgent care or emergency department, call ahead and tell them  about your recent travel, contact with someone diagnosed with COVID-19, and your symptoms. You should receive instructions from your physician's office regarding next steps of care.  . When you arrive at healthcare provider, tell the healthcare staff immediately you have returned from visiting [place], [place], [place], [place] or [place]; or traveled in the [place] to [place], [place], [place], or [place]; in the last two weeks or you have been in close contact with a person diagnosed with COVID-19 in the last 2 weeks.   . Tell the health care staff about your symptoms: fever, cough and shortness of breath. . After you have been seen by a medical provider, you will be either: o Tested for (COVID-19) and discharged home on quarantine except to seek medical care if symptoms worsen, and asked to  - Stay home and avoid contact with others until you get your results (4-5 days)  - Avoid travel on public transportation if possible (such as bus, train, or airplane) or o Sent to the Emergency Department by EMS for evaluation, COVID-19 testing, and possible admission depending on your condition and test results.  What to do if you are LOW RISK for COVID-19?  Reduce your risk of any infection by using the same precautions used for avoiding the common cold or flu:  . Wash your hands often with soap and warm water for at least 20 seconds.  If soap and water are not readily available, use an alcohol-based hand sanitizer with at least 60% alcohol.  . If coughing   or sneezing, cover your mouth and nose by coughing or sneezing into the elbow areas of your shirt or coat, into a tissue or into your sleeve (not your hands). . Avoid shaking hands with others and consider head nods or verbal greetings only. . Avoid touching your eyes, nose, or mouth with unwashed hands.  . Avoid close contact with people who are sick. . Avoid places or events with large numbers of people in one location, like concerts or  sporting events. . Carefully consider travel plans you have or are making. . If you are planning any travel outside or inside the [place], visit the CDC's Travelers' Health webpage for the latest health notices. . If you have some symptoms but not all symptoms, continue to monitor at home and seek medical attention if your symptoms worsen. . If you are having a medical emergency, call 911.   ADDITIONAL HEALTHCARE OPTIONS FOR PATIENTS  [facility] / e-Visit: [email or web address]         [facility]: [phone]  [facility]: [phone]                   [facility]: [phone]   

## 2019-10-05 ENCOUNTER — Other Ambulatory Visit: Payer: Self-pay | Admitting: *Deleted

## 2019-10-05 DIAGNOSIS — C9000 Multiple myeloma not having achieved remission: Secondary | ICD-10-CM

## 2019-10-05 MED ORDER — LENALIDOMIDE 10 MG PO CAPS: 10.0000 mg | ORAL_CAPSULE | Freq: Every day | ORAL | 0 refills | Status: DC

## 2019-10-05 NOTE — Telephone Encounter (Signed)
Received faxed refill request from [facility] for Revlimid 10 mg Refill escribed to [facility] by Westley Gambles, [place] - [address] per Dr.Kale's OV note 09/20/19 [facility] auth # [id number], 10/05/19

## 2019-10-06 NOTE — Progress Notes (Signed)
Pharmacist Chemotherapy Monitoring - Follow Up Assessment    I verify that I have reviewed each item in the below checklist:  . Regimen for the patient is scheduled for the appropriate day and plan matches scheduled date. Marland Kitchen Appropriate non-routine labs are ordered dependent on drug ordered. . If applicable, additional medications reviewed and ordered per protocol based on lifetime cumulative doses and/or treatment regimen.   Plan for follow-up and/or issues identified: Yes . I-vent associated with next due treatment: Yes . MD and/or nursing notified: No  [name] 10/06/2019 2:50 PM

## 2019-10-07 LAB — MULTIPLE MYELOMA PANEL, SERUM
Albumin SerPl Elph-Mcnc: 3.5 g/dL (ref 2.9–4.4)
Albumin/Glob SerPl: 0.9 (ref 0.7–1.7)
Alpha 1: 0.3 g/dL (ref 0.0–0.4)
Alpha2 Glob SerPl Elph-Mcnc: 0.8 g/dL (ref 0.4–1.0)
B-Globulin SerPl Elph-Mcnc: 0.9 g/dL (ref 0.7–1.3)
Gamma Glob SerPl Elph-Mcnc: 2.1 g/dL — ABNORMAL HIGH (ref 0.4–1.8)
Globulin, Total: 4 g/dL — ABNORMAL HIGH (ref 2.2–3.9)
IgA: 9 mg/dL — ABNORMAL LOW (ref 61–437)
IgG (Immunoglobin G), Serum: 2505 mg/dL — ABNORMAL HIGH (ref 603–1613)
IgM (Immunoglobulin M), Srm: 5 mg/dL — ABNORMAL LOW (ref 15–143)
M Protein SerPl Elph-Mcnc: 1.9 g/dL — ABNORMAL HIGH
Total Protein ELP: 7.5 g/dL (ref 6.0–8.5)

## 2019-10-11 ENCOUNTER — Telehealth: Payer: Self-pay
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
## 2019-10-11 NOTE — Telephone Encounter (Signed)
Received call from Pt. c/o the stye in his L eye has not resolved and has now progressed to his R eye. Informed patient that Dr. Irene Limbo recommends that he sees his eye doctor. He is willing to refill his erythromycin ointment if that has run out; and to continue gently wiping eyelids with warm water and soap. Dr. Irene Limbo will reassess upon f/u visit tomorrow 10/12/19. Patient was agreeable with this and verbalized understanding.

## 2019-10-12 ENCOUNTER — Other Ambulatory Visit: Payer: Self-pay

## 2019-10-12 ENCOUNTER — Inpatient Hospital Stay: Payer: Medicare Other | Admitting: Hematology

## 2019-10-12 ENCOUNTER — Inpatient Hospital Stay: Payer: Medicare Other

## 2019-10-12 VITALS — BP 110/66 | HR 80 | Temp 98.5°F | Resp 18 | Ht 70.0 in | Wt 219.3 lb

## 2019-10-12 DIAGNOSIS — C7951 Secondary malignant neoplasm of bone: Secondary | ICD-10-CM

## 2019-10-12 DIAGNOSIS — Z7189 Other specified counseling: Secondary | ICD-10-CM

## 2019-10-12 DIAGNOSIS — Z5111 Encounter for antineoplastic chemotherapy: Secondary | ICD-10-CM | POA: Diagnosis not present

## 2019-10-12 DIAGNOSIS — C9000 Multiple myeloma not having achieved remission: Secondary | ICD-10-CM

## 2019-10-12 DIAGNOSIS — Z5112 Encounter for antineoplastic immunotherapy: Secondary | ICD-10-CM | POA: Diagnosis not present

## 2019-10-12 DIAGNOSIS — Z79899 Other long term (current) drug therapy: Secondary | ICD-10-CM | POA: Diagnosis not present

## 2019-10-12 LAB — CBC WITH DIFFERENTIAL/PLATELET
Abs Immature Granulocytes: 0.02 10*3/uL (ref 0.00–0.07)
Basophils Absolute: 0 10*3/uL (ref 0.0–0.1)
Basophils Relative: 0 %
Eosinophils Absolute: 0 10*3/uL (ref 0.0–0.5)
Eosinophils Relative: 1 %
HCT: 30.6 % — ABNORMAL LOW (ref 39.0–52.0)
Hemoglobin: 10.4 g/dL — ABNORMAL LOW (ref 13.0–17.0)
Immature Granulocytes: 0 %
Lymphocytes Relative: 29 %
Lymphs Abs: 1.9 10*3/uL (ref 0.7–4.0)
MCH: 34.8 pg — ABNORMAL HIGH (ref 26.0–34.0)
MCHC: 34 g/dL (ref 30.0–36.0)
MCV: 102.3 fL — ABNORMAL HIGH (ref 80.0–100.0)
Monocytes Absolute: 1.1 10*3/uL — ABNORMAL HIGH (ref 0.1–1.0)
Monocytes Relative: 16 %
Neutro Abs: 3.6 10*3/uL (ref 1.7–7.7)
Neutrophils Relative %: 54 %
Platelets: 161 10*3/uL (ref 150–400)
RBC: 2.99 MIL/uL — ABNORMAL LOW (ref 4.22–5.81)
RDW: 13.6 % (ref 11.5–15.5)
WBC: 6.7 10*3/uL (ref 4.0–10.5)
nRBC: 0 % (ref 0.0–0.2)

## 2019-10-12 LAB — CMP (CANCER CENTER ONLY)
ALT: 15 U/L (ref 0–44)
AST: 26 U/L (ref 15–41)
Albumin: 3.6 g/dL (ref 3.5–5.0)
Alkaline Phosphatase: 71 U/L (ref 38–126)
Anion gap: 8 (ref 5–15)
BUN: 26 mg/dL — ABNORMAL HIGH (ref 8–23)
CO2: 23 mmol/L (ref 22–32)
Calcium: 8.7 mg/dL — ABNORMAL LOW (ref 8.9–10.3)
Chloride: 105 mmol/L (ref 98–111)
Creatinine: 1.62 mg/dL — ABNORMAL HIGH (ref 0.61–1.24)
GFR, Est AFR Am: 48 mL/min — ABNORMAL LOW (ref >60)
GFR, Estimated: 42 mL/min — ABNORMAL LOW (ref >60)
Glucose, Bld: 145 mg/dL — ABNORMAL HIGH (ref 70–99)
Potassium: 3.9 mmol/L (ref 3.5–5.1)
Sodium: 136 mmol/L (ref 135–145)
Total Bilirubin: 0.6 mg/dL (ref 0.3–1.2)
Total Protein: 7.9 g/dL (ref 6.5–8.1)

## 2019-10-12 MED ORDER — PROCHLORPERAZINE MALEATE 10 MG PO TABS
10.0000 mg | ORAL_TABLET | Freq: Once | ORAL | Status: AC
  Administered 2019-10-12: 10 mg via ORAL

## 2019-10-12 MED ORDER — LENALIDOMIDE 15 MG PO CAPS: 15.0000 mg | ORAL_CAPSULE | Freq: Every day | ORAL | 2 refills | Status: DC

## 2019-10-12 MED ORDER — BORTEZOMIB CHEMO SQ INJECTION 3.5 MG (2.5MG/ML)
1.3000 mg/m2 | Freq: Once | INTRAMUSCULAR | Status: AC
  Administered 2019-10-12: 2.75 mg via SUBCUTANEOUS
  Filled 2019-10-12: qty 1.1

## 2019-10-12 MED ORDER — PROCHLORPERAZINE MALEATE 10 MG PO TABS
ORAL_TABLET | ORAL | Status: AC
  Filled 2019-10-12: qty 1

## 2019-10-12 NOTE — Progress Notes (Signed)
Pharmacist Chemotherapy Monitoring - Follow Up Assessment    I verify that I have reviewed each item in the below checklist:  . Regimen for the patient is scheduled for the appropriate day and plan matches scheduled date. Marland Kitchen Appropriate non-routine labs are ordered dependent on drug ordered. . If applicable, additional medications reviewed and ordered per protocol based on lifetime cumulative doses and/or treatment regimen.   Plan for follow-up and/or issues identified: Yes . I-vent associated with next due treatment: Yes . MD and/or nursing notified: No  [name] 10/12/2019 1:12 PM

## 2019-10-12 NOTE — Progress Notes (Signed)
Per Dr. Kale, ok to treat with elevated creatinine.  

## 2019-10-12 NOTE — Patient Instructions (Signed)
[facility] Discharge Instructions for Patients Receiving Chemotherapy  Today you received the following chemotherapy agents: bortezomib.  To help prevent nausea and vomiting after your treatment, we encourage you to take your nausea medication as directed.   If you develop nausea and vomiting that is not controlled by your nausea medication, call the clinic.   BELOW ARE SYMPTOMS THAT SHOULD BE REPORTED IMMEDIATELY:  *FEVER GREATER THAN 100.5 F  *CHILLS WITH OR WITHOUT FEVER  NAUSEA AND VOMITING THAT IS NOT CONTROLLED WITH YOUR NAUSEA MEDICATION  *UNUSUAL SHORTNESS OF BREATH  *UNUSUAL BRUISING OR BLEEDING  TENDERNESS IN MOUTH AND THROAT WITH OR WITHOUT PRESENCE OF ULCERS  *URINARY PROBLEMS  *BOWEL PROBLEMS  UNUSUAL RASH Items with * indicate a potential emergency and should be followed up as soon as possible.  Feel free to call the clinic should you have any questions or concerns. The clinic phone number is [phone].  Please show the CHEMO ALERT CARD at check-in to the Emergency Department and triage nurse.   

## 2019-10-17 ENCOUNTER — Other Ambulatory Visit: Payer: Self-pay | Admitting: Hematology

## 2019-10-18 ENCOUNTER — Inpatient Hospital Stay: Payer: Medicare Other

## 2019-10-18 ENCOUNTER — Other Ambulatory Visit: Payer: Self-pay

## 2019-10-18 VITALS — BP 108/71 | HR 75 | Temp 98.5°F | Resp 18

## 2019-10-18 DIAGNOSIS — Z7189 Other specified counseling: Secondary | ICD-10-CM

## 2019-10-18 DIAGNOSIS — C7951 Secondary malignant neoplasm of bone: Secondary | ICD-10-CM

## 2019-10-18 DIAGNOSIS — Z5112 Encounter for antineoplastic immunotherapy: Secondary | ICD-10-CM | POA: Diagnosis not present

## 2019-10-18 DIAGNOSIS — C9000 Multiple myeloma not having achieved remission: Secondary | ICD-10-CM

## 2019-10-18 DIAGNOSIS — Z79899 Other long term (current) drug therapy: Secondary | ICD-10-CM | POA: Diagnosis not present

## 2019-10-18 LAB — CMP (CANCER CENTER ONLY)
ALT: 14 U/L (ref 0–44)
AST: 25 U/L (ref 15–41)
Albumin: 3.4 g/dL — ABNORMAL LOW (ref 3.5–5.0)
Alkaline Phosphatase: 70 U/L (ref 38–126)
Anion gap: 7 (ref 5–15)
BUN: 11 mg/dL (ref 8–23)
CO2: 27 mmol/L (ref 22–32)
Calcium: 8.7 mg/dL — ABNORMAL LOW (ref 8.9–10.3)
Chloride: 105 mmol/L (ref 98–111)
Creatinine: 1.39 mg/dL — ABNORMAL HIGH (ref 0.61–1.24)
GFR, Est AFR Am: 58 mL/min — ABNORMAL LOW (ref >60)
GFR, Estimated: 50 mL/min — ABNORMAL LOW (ref >60)
Glucose, Bld: 89 mg/dL (ref 70–99)
Potassium: 4.5 mmol/L (ref 3.5–5.1)
Sodium: 139 mmol/L (ref 135–145)
Total Bilirubin: 0.6 mg/dL (ref 0.3–1.2)
Total Protein: 7.7 g/dL (ref 6.5–8.1)

## 2019-10-18 LAB — CBC WITH DIFFERENTIAL/PLATELET
Abs Immature Granulocytes: 0.02 10*3/uL (ref 0.00–0.07)
Basophils Absolute: 0 10*3/uL (ref 0.0–0.1)
Basophils Relative: 1 %
Eosinophils Absolute: 0.5 10*3/uL (ref 0.0–0.5)
Eosinophils Relative: 9 %
HCT: 32.8 % — ABNORMAL LOW (ref 39.0–52.0)
Hemoglobin: 11 g/dL — ABNORMAL LOW (ref 13.0–17.0)
Immature Granulocytes: 0 %
Lymphocytes Relative: 37 %
Lymphs Abs: 1.8 10*3/uL (ref 0.7–4.0)
MCH: 34.5 pg — ABNORMAL HIGH (ref 26.0–34.0)
MCHC: 33.5 g/dL (ref 30.0–36.0)
MCV: 102.8 fL — ABNORMAL HIGH (ref 80.0–100.0)
Monocytes Absolute: 0.4 10*3/uL (ref 0.1–1.0)
Monocytes Relative: 8 %
Neutro Abs: 2.2 10*3/uL (ref 1.7–7.7)
Neutrophils Relative %: 45 %
Platelets: 169 10*3/uL (ref 150–400)
RBC: 3.19 MIL/uL — ABNORMAL LOW (ref 4.22–5.81)
RDW: 13.8 % (ref 11.5–15.5)
WBC: 4.9 10*3/uL (ref 4.0–10.5)
nRBC: 0 % (ref 0.0–0.2)

## 2019-10-18 MED ORDER — PROCHLORPERAZINE MALEATE 10 MG PO TABS
10.0000 mg | ORAL_TABLET | Freq: Once | ORAL | Status: AC
  Administered 2019-10-18: 10 mg via ORAL

## 2019-10-18 MED ORDER — BORTEZOMIB CHEMO SQ INJECTION 3.5 MG (2.5MG/ML)
1.3000 mg/m2 | Freq: Once | INTRAMUSCULAR | Status: AC
  Administered 2019-10-18: 12:00:00 2.75 mg via SUBCUTANEOUS
  Filled 2019-10-18: qty 1.1

## 2019-10-18 MED ORDER — PROCHLORPERAZINE MALEATE 10 MG PO TABS
ORAL_TABLET | ORAL | Status: AC
  Filled 2019-10-18: qty 1

## 2019-10-18 NOTE — Patient Instructions (Signed)
[facility] Discharge Instructions for Patients Receiving Chemotherapy  Today you received the following chemotherapy agents: bortezomib.  To help prevent nausea and vomiting after your treatment, we encourage you to take your nausea medication as directed.   If you develop nausea and vomiting that is not controlled by your nausea medication, call the clinic.   BELOW ARE SYMPTOMS THAT SHOULD BE REPORTED IMMEDIATELY:  *FEVER GREATER THAN 100.5 F  *CHILLS WITH OR WITHOUT FEVER  NAUSEA AND VOMITING THAT IS NOT CONTROLLED WITH YOUR NAUSEA MEDICATION  *UNUSUAL SHORTNESS OF BREATH  *UNUSUAL BRUISING OR BLEEDING  TENDERNESS IN MOUTH AND THROAT WITH OR WITHOUT PRESENCE OF ULCERS  *URINARY PROBLEMS  *BOWEL PROBLEMS  UNUSUAL RASH Items with * indicate a potential emergency and should be followed up as soon as possible.  Feel free to call the clinic should you have any questions or concerns. The clinic phone number is [phone].  Please show the CHEMO ALERT CARD at check-in to the Emergency Department and triage nurse.   

## 2019-10-19 ENCOUNTER — Telehealth: Payer: Self-pay | Admitting: Hematology

## 2019-10-19 ENCOUNTER — Other Ambulatory Visit: Payer: Self-pay | Admitting: Hematology

## 2019-10-19 NOTE — Progress Notes (Signed)
Pharmacist Chemotherapy Monitoring - Follow Up Assessment    I verify that I have reviewed each item in the below checklist:  . Regimen for the patient is scheduled for the appropriate day and plan matches scheduled date. Marland Kitchen Appropriate non-routine labs are ordered dependent on drug ordered. . If applicable, additional medications reviewed and ordered per protocol based on lifetime cumulative doses and/or treatment regimen.   Plan for follow-up and/or issues identified: Yes . I-vent associated with next due treatment: Yes . MD and/or nursing notified: Yes   Kennith Center, Pharm.D., CPP 10/19/2019@2 :42 PM

## 2019-10-19 NOTE — Telephone Encounter (Signed)
Scheduled per los, patient has been called and notified of upcoming appointments. 

## 2019-10-20 ENCOUNTER — Other Ambulatory Visit: Payer: Self-pay | Admitting: *Deleted

## 2019-10-20 DIAGNOSIS — H04123 Dry eye syndrome of bilateral lacrimal glands: Secondary | ICD-10-CM | POA: Diagnosis not present

## 2019-10-20 DIAGNOSIS — H00025 Hordeolum internum left lower eyelid: Secondary | ICD-10-CM | POA: Diagnosis not present

## 2019-10-20 DIAGNOSIS — H00021 Hordeolum internum right upper eyelid: Secondary | ICD-10-CM | POA: Diagnosis not present

## 2019-10-20 DIAGNOSIS — H00022 Hordeolum internum right lower eyelid: Secondary | ICD-10-CM | POA: Diagnosis not present

## 2019-10-20 DIAGNOSIS — C9000 Multiple myeloma not having achieved remission: Secondary | ICD-10-CM

## 2019-10-20 MED ORDER — LENALIDOMIDE 15 MG PO CAPS: 15.0000 mg | ORAL_CAPSULE | Freq: Every day | ORAL | 0 refills | Status: DC

## 2019-10-20 NOTE — Telephone Encounter (Signed)
10/12/19 OV note - per Dr. Irene Limbo, increase Revlimid to 15 [id number] for 14 days then hold 7 days. Repeat every 21 days Verbal order to increase dose with next refill  Refill escribed to [facility] by Westley Gambles, [place] - [id number] [facility] auth# [id number], 10/20/2019

## 2019-10-25 ENCOUNTER — Other Ambulatory Visit: Payer: Self-pay

## 2019-10-25 ENCOUNTER — Inpatient Hospital Stay: Payer: Medicare Other | Attending: Hematology

## 2019-10-25 ENCOUNTER — Inpatient Hospital Stay: Payer: Medicare Other

## 2019-10-25 VITALS — BP 123/77 | HR 71 | Temp 98.0°F | Resp 16

## 2019-10-25 DIAGNOSIS — D649 Anemia, unspecified: Secondary | ICD-10-CM | POA: Diagnosis not present

## 2019-10-25 DIAGNOSIS — Z7189 Other specified counseling: Secondary | ICD-10-CM

## 2019-10-25 DIAGNOSIS — C9000 Multiple myeloma not having achieved remission: Secondary | ICD-10-CM | POA: Diagnosis not present

## 2019-10-25 DIAGNOSIS — Z5112 Encounter for antineoplastic immunotherapy: Secondary | ICD-10-CM | POA: Diagnosis not present

## 2019-10-25 DIAGNOSIS — C7951 Secondary malignant neoplasm of bone: Secondary | ICD-10-CM

## 2019-10-25 LAB — CBC WITH DIFFERENTIAL/PLATELET
Abs Immature Granulocytes: 0.01 10*3/uL (ref 0.00–0.07)
Basophils Absolute: 0.1 10*3/uL (ref 0.0–0.1)
Basophils Relative: 1 %
Eosinophils Absolute: 0.3 10*3/uL (ref 0.0–0.5)
Eosinophils Relative: 5 %
HCT: 33.6 % — ABNORMAL LOW (ref 39.0–52.0)
Hemoglobin: 11.1 g/dL — ABNORMAL LOW (ref 13.0–17.0)
Immature Granulocytes: 0 %
Lymphocytes Relative: 42 %
Lymphs Abs: 2.1 10*3/uL (ref 0.7–4.0)
MCH: 33.3 pg (ref 26.0–34.0)
MCHC: 33 g/dL (ref 30.0–36.0)
MCV: 100.9 fL — ABNORMAL HIGH (ref 80.0–100.0)
Monocytes Absolute: 0.7 10*3/uL (ref 0.1–1.0)
Monocytes Relative: 14 %
Neutro Abs: 2 10*3/uL (ref 1.7–7.7)
Neutrophils Relative %: 38 %
Platelets: 155 10*3/uL (ref 150–400)
RBC: 3.33 MIL/uL — ABNORMAL LOW (ref 4.22–5.81)
RDW: 14 % (ref 11.5–15.5)
WBC: 5.1 10*3/uL (ref 4.0–10.5)
nRBC: 0 % (ref 0.0–0.2)

## 2019-10-25 LAB — CMP (CANCER CENTER ONLY)
ALT: 19 U/L (ref 0–44)
AST: 24 U/L (ref 15–41)
Albumin: 3.6 g/dL (ref 3.5–5.0)
Alkaline Phosphatase: 65 U/L (ref 38–126)
Anion gap: 9 (ref 5–15)
BUN: 16 mg/dL (ref 8–23)
CO2: 23 mmol/L (ref 22–32)
Calcium: 9 mg/dL (ref 8.9–10.3)
Chloride: 105 mmol/L (ref 98–111)
Creatinine: 1.5 mg/dL — ABNORMAL HIGH (ref 0.61–1.24)
GFR, Est AFR Am: 53 mL/min — ABNORMAL LOW (ref >60)
GFR, Estimated: 46 mL/min — ABNORMAL LOW (ref >60)
Glucose, Bld: 96 mg/dL (ref 70–99)
Potassium: 4.3 mmol/L (ref 3.5–5.1)
Sodium: 137 mmol/L (ref 135–145)
Total Bilirubin: 0.6 mg/dL (ref 0.3–1.2)
Total Protein: 7.8 g/dL (ref 6.5–8.1)

## 2019-10-25 MED ORDER — SODIUM CHLORIDE 0.9 % IV SOLN
60.0000 mg | Freq: Once | INTRAVENOUS | Status: AC
  Administered 2019-10-25: 60 mg via INTRAVENOUS
  Filled 2019-10-25: qty 10

## 2019-10-25 MED ORDER — PROCHLORPERAZINE MALEATE 10 MG PO TABS
10.0000 mg | ORAL_TABLET | Freq: Once | ORAL | Status: AC
  Administered 2019-10-25: 10 mg via ORAL

## 2019-10-25 MED ORDER — SODIUM CHLORIDE 0.9 % IV SOLN
INTRAVENOUS | Status: DC
  Filled 2019-10-25 (×2): qty 250

## 2019-10-25 MED ORDER — BORTEZOMIB CHEMO SQ INJECTION 3.5 MG (2.5MG/ML)
1.3000 mg/m2 | Freq: Once | INTRAMUSCULAR | Status: AC
  Administered 2019-10-25: 12:00:00 2.75 mg via SUBCUTANEOUS
  Filled 2019-10-25: qty 1.1

## 2019-10-25 MED ORDER — SODIUM CHLORIDE 0.9 % IV SOLN
60.0000 mg | Freq: Once | INTRAVENOUS | Status: DC
  Filled 2019-10-25: qty 20

## 2019-10-25 MED ORDER — PROCHLORPERAZINE MALEATE 10 MG PO TABS
ORAL_TABLET | ORAL | Status: AC
  Filled 2019-10-25: qty 1

## 2019-10-25 NOTE — Patient Instructions (Signed)
[facility] Discharge Instructions for Patients Receiving Chemotherapy  Today you received the following chemotherapy agents: bortezomib.  To help prevent nausea and vomiting after your treatment, we encourage you to take your nausea medication as directed.   If you develop nausea and vomiting that is not controlled by your nausea medication, call the clinic.   BELOW ARE SYMPTOMS THAT SHOULD BE REPORTED IMMEDIATELY:  *FEVER GREATER THAN 100.5 F  *CHILLS WITH OR WITHOUT FEVER  NAUSEA AND VOMITING THAT IS NOT CONTROLLED WITH YOUR NAUSEA MEDICATION  *UNUSUAL SHORTNESS OF BREATH  *UNUSUAL BRUISING OR BLEEDING  TENDERNESS IN MOUTH AND THROAT WITH OR WITHOUT PRESENCE OF ULCERS  *URINARY PROBLEMS  *BOWEL PROBLEMS  UNUSUAL RASH Items with * indicate a potential emergency and should be followed up as soon as possible.  Feel free to call the clinic should you have any questions or concerns. The clinic phone number is [phone].  Please show the [facility] at check-in to the Emergency Department and triage nurse.  Pamidronate injection What is this medicine? PAMIDRONATE (pa mi DROE nate) slows calcium loss from bones. It is used to treat high calcium blood levels from cancer or Paget's disease. It is also used to treat bone pain and prevent fractures from certain cancers that have spread to the bone. This medicine may be used for other purposes; ask your health care provider or pharmacist if you have questions. COMMON BRAND NAME(S): Aredia What should I tell my health care provider before I take this medicine? They need to know if you have any of these conditions:  aspirin-sensitive asthma  dental disease  kidney disease  an unusual or allergic reaction to pamidronate, other medicines, foods, dyes, or preservatives  pregnant or trying to get pregnant  breast-feeding How should I use this medicine? This medicine is for infusion into a vein. It is  given by a health care professional in a hospital or clinic setting. Talk to your pediatrician regarding the use of this medicine in children. This medicine is not approved for use in children. Overdosage: If you think you have taken too much of this medicine contact a poison control center or emergency room at once. NOTE: This medicine is only for you. Do not share this medicine with others. What if I miss a dose? This does not apply. What may interact with this medicine?  certain antibiotics given by injection  medicines for inflammation or pain like ibuprofen, naproxen  some diuretics like bumetanide, furosemide  cyclosporine  parathyroid hormone  tacrolimus  teriparatide  thalidomide This list may not describe all possible interactions. Give your health care provider a list of all the medicines, herbs, non-prescription drugs, or dietary supplements you use. Also tell them if you smoke, drink alcohol, or use illegal drugs. Some items may interact with your medicine. What should I watch for while using this medicine? Visit your doctor or health care professional for regular checkups. It may be some time before you see the benefit from this medicine. Do not stop taking your medicine unless your doctor tells you to. Your doctor may order blood tests or other tests to see how you are doing. Women should inform their doctor if they wish to become pregnant or think they might be pregnant. There is a potential for serious side effects to an unborn child. Talk to your health care professional or pharmacist for more information. You should make sure that you get enough calcium and vitamin D while you are  taking this medicine. Discuss the foods you eat and the vitamins you take with your health care professional. Some people who take this medicine have severe bone, joint, and/or muscle pain. This medicine may also increase your risk for a broken thigh bone. Tell your doctor right away if you  have pain in your upper leg or groin. Tell your doctor if you have any pain that does not go away or that gets worse. What side effects may I notice from receiving this medicine? Side effects that you should report to your doctor or health care professional as soon as possible:  allergic reactions like skin rash, itching or hives, swelling of the face, lips, or tongue  black or tarry stools  changes in vision  eye inflammation, pain  high blood pressure  jaw pain, especially burning or cramping  muscle weakness  numb, tingling pain  swelling of feet or hands  trouble passing urine or change in the amount of urine  unable to move easily Side effects that usually do not require medical attention (report to your doctor or health care professional if they continue or are bothersome):  bone, joint, or muscle pain  constipation  dizzy, drowsy  fever  headache  loss of appetite  nausea, vomiting  pain at site where injected This list may not describe all possible side effects. Call your doctor for medical advice about side effects. You may report side effects to FDA at 1-800-FDA-1088. Where should I keep my medicine? This drug is given in a hospital or clinic and will not be stored at home. NOTE: This sheet is a summary. It may not cover all possible information. If you have questions about this medicine, talk to your doctor, pharmacist, or health care provider.  2020 Elsevier/Gold Standard (2011-01-04 08:49:49)

## 2019-10-27 NOTE — Progress Notes (Signed)
Pharmacist Chemotherapy Monitoring - Follow Up Assessment    I verify that I have reviewed each item in the below checklist:  . Regimen for the patient is scheduled for the appropriate day and plan matches scheduled date. Marland Kitchen Appropriate non-routine labs are ordered dependent on drug ordered. . If applicable, additional medications reviewed and ordered per protocol based on lifetime cumulative doses and/or treatment regimen.   Plan for follow-up and/or issues identified: No . I-vent associated with next due treatment: No . MD and/or nursing notified: No  Runell Kovich D 10/27/2019 2:35 PM

## 2019-10-28 LAB — MULTIPLE MYELOMA PANEL, SERUM
Albumin SerPl Elph-Mcnc: 3.3 g/dL (ref 2.9–4.4)
Albumin/Glob SerPl: 0.9 (ref 0.7–1.7)
Alpha 1: 0.3 g/dL (ref 0.0–0.4)
Alpha2 Glob SerPl Elph-Mcnc: 0.8 g/dL (ref 0.4–1.0)
B-Globulin SerPl Elph-Mcnc: 0.9 g/dL (ref 0.7–1.3)
Gamma Glob SerPl Elph-Mcnc: 1.8 g/dL (ref 0.4–1.8)
Globulin, Total: 3.9 g/dL (ref 2.2–3.9)
IgA: 9 mg/dL — ABNORMAL LOW (ref 61–437)
IgG (Immunoglobin G), Serum: 2213 mg/dL — ABNORMAL HIGH (ref 603–1613)
IgM (Immunoglobulin M), Srm: 5 mg/dL — ABNORMAL LOW (ref 15–143)
M Protein SerPl Elph-Mcnc: 1.6 g/dL — ABNORMAL HIGH
Total Protein ELP: 7.2 g/dL (ref 6.0–8.5)

## 2019-11-01 ENCOUNTER — Encounter: Payer: Self-pay | Admitting: Hematology

## 2019-11-01 NOTE — Progress Notes (Signed)
Pharmacist Chemotherapy Monitoring - Follow Up Assessment    I verify that I have reviewed each item in the below checklist:  . Regimen for the patient is scheduled for the appropriate day and plan matches scheduled date. Marland Kitchen Appropriate non-routine labs are ordered dependent on drug ordered. . If applicable, additional medications reviewed and ordered per protocol based on lifetime cumulative doses and/or treatment regimen.   Plan for follow-up and/or issues identified: No . I-vent associated with next due treatment: No . MD and/or nursing notified: No  Romualdo Bolk [facility] 11/01/2019 3:00 PM

## 2019-11-02 ENCOUNTER — Inpatient Hospital Stay: Payer: Medicare Other

## 2019-11-02 ENCOUNTER — Ambulatory Visit: Payer: Medicare Other

## 2019-11-02 ENCOUNTER — Other Ambulatory Visit: Payer: Self-pay | Admitting: Hematology

## 2019-11-02 ENCOUNTER — Inpatient Hospital Stay: Payer: Medicare Other | Admitting: Hematology

## 2019-11-02 ENCOUNTER — Other Ambulatory Visit: Payer: Self-pay

## 2019-11-02 ENCOUNTER — Other Ambulatory Visit: Payer: Medicare Other

## 2019-11-02 VITALS — BP 123/81 | HR 68 | Temp 97.6°F | Resp 17 | Ht 70.0 in | Wt 219.9 lb

## 2019-11-02 DIAGNOSIS — Z5111 Encounter for antineoplastic chemotherapy: Secondary | ICD-10-CM | POA: Diagnosis not present

## 2019-11-02 DIAGNOSIS — Z7189 Other specified counseling: Secondary | ICD-10-CM

## 2019-11-02 DIAGNOSIS — C9000 Multiple myeloma not having achieved remission: Secondary | ICD-10-CM

## 2019-11-02 DIAGNOSIS — Z5112 Encounter for antineoplastic immunotherapy: Secondary | ICD-10-CM | POA: Diagnosis not present

## 2019-11-02 DIAGNOSIS — D649 Anemia, unspecified: Secondary | ICD-10-CM | POA: Diagnosis not present

## 2019-11-02 DIAGNOSIS — C7951 Secondary malignant neoplasm of bone: Secondary | ICD-10-CM

## 2019-11-02 LAB — CBC WITH DIFFERENTIAL/PLATELET
Abs Immature Granulocytes: 0.01 10*3/uL (ref 0.00–0.07)
Basophils Absolute: 0 10*3/uL (ref 0.0–0.1)
Basophils Relative: 1 %
Eosinophils Absolute: 0.1 10*3/uL (ref 0.0–0.5)
Eosinophils Relative: 1 %
HCT: 32.8 % — ABNORMAL LOW (ref 39.0–52.0)
Hemoglobin: 10.9 g/dL — ABNORMAL LOW (ref 13.0–17.0)
Immature Granulocytes: 0 %
Lymphocytes Relative: 36 %
Lymphs Abs: 2.3 10*3/uL (ref 0.7–4.0)
MCH: 33.6 pg (ref 26.0–34.0)
MCHC: 33.2 g/dL (ref 30.0–36.0)
MCV: 101.2 fL — ABNORMAL HIGH (ref 80.0–100.0)
Monocytes Absolute: 1 10*3/uL (ref 0.1–1.0)
Monocytes Relative: 16 %
Neutro Abs: 2.9 10*3/uL (ref 1.7–7.7)
Neutrophils Relative %: 46 %
Platelets: 161 10*3/uL (ref 150–400)
RBC: 3.24 MIL/uL — ABNORMAL LOW (ref 4.22–5.81)
RDW: 13.9 % (ref 11.5–15.5)
WBC: 6.3 10*3/uL (ref 4.0–10.5)
nRBC: 0 % (ref 0.0–0.2)

## 2019-11-02 LAB — CMP (CANCER CENTER ONLY)
ALT: 15 U/L (ref 0–44)
AST: 24 U/L (ref 15–41)
Albumin: 3.7 g/dL (ref 3.5–5.0)
Alkaline Phosphatase: 63 U/L (ref 38–126)
Anion gap: 8 (ref 5–15)
BUN: 18 mg/dL (ref 8–23)
CO2: 27 mmol/L (ref 22–32)
Calcium: 9.2 mg/dL (ref 8.9–10.3)
Chloride: 105 mmol/L (ref 98–111)
Creatinine: 1.37 mg/dL — ABNORMAL HIGH (ref 0.61–1.24)
GFR, Est AFR Am: 59 mL/min — ABNORMAL LOW (ref >60)
GFR, Estimated: 51 mL/min — ABNORMAL LOW (ref >60)
Glucose, Bld: 84 mg/dL (ref 70–99)
Potassium: 3.8 mmol/L (ref 3.5–5.1)
Sodium: 140 mmol/L (ref 135–145)
Total Bilirubin: 0.5 mg/dL (ref 0.3–1.2)
Total Protein: 7.9 g/dL (ref 6.5–8.1)

## 2019-11-02 MED ORDER — BORTEZOMIB CHEMO SQ INJECTION 3.5 MG (2.5MG/ML)
1.3000 mg/m2 | Freq: Once | INTRAMUSCULAR | Status: AC
  Administered 2019-11-02: 2.75 mg via SUBCUTANEOUS
  Filled 2019-11-02: qty 1.1

## 2019-11-02 MED ORDER — PROCHLORPERAZINE MALEATE 10 MG PO TABS
10.0000 mg | ORAL_TABLET | Freq: Once | ORAL | Status: AC
  Administered 2019-11-02: 10 mg via ORAL

## 2019-11-02 MED ORDER — PROCHLORPERAZINE MALEATE 10 MG PO TABS
ORAL_TABLET | ORAL | Status: AC
  Filled 2019-11-02: qty 1
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
## 2019-11-02 NOTE — Patient Instructions (Signed)
[facility] Discharge Instructions for Patients Receiving Chemotherapy  Today you received the following chemotherapy agents: bortezomib.  To help prevent nausea and vomiting after your treatment, we encourage you to take your nausea medication as directed.   If you develop nausea and vomiting that is not controlled by your nausea medication, call the clinic.   BELOW ARE SYMPTOMS THAT SHOULD BE REPORTED IMMEDIATELY:  *FEVER GREATER THAN 100.5 F  *CHILLS WITH OR WITHOUT FEVER  NAUSEA AND VOMITING THAT IS NOT CONTROLLED WITH YOUR NAUSEA MEDICATION  *UNUSUAL SHORTNESS OF BREATH  *UNUSUAL BRUISING OR BLEEDING  TENDERNESS IN MOUTH AND THROAT WITH OR WITHOUT PRESENCE OF ULCERS  *URINARY PROBLEMS  *BOWEL PROBLEMS  UNUSUAL RASH Items with * indicate a potential emergency and should be followed up as soon as possible.  Feel free to call the clinic should you have any questions or concerns. The clinic phone number is [phone].  Please show the CHEMO ALERT CARD at check-in to the Emergency Department and triage nurse.   

## 2019-11-03 ENCOUNTER — Other Ambulatory Visit: Payer: Self-pay | Admitting: *Deleted

## 2019-11-03 NOTE — Progress Notes (Signed)
Pharmacist Chemotherapy Monitoring - Follow Up Assessment    I verify that I have reviewed each item in the below checklist:  . Regimen for the patient is scheduled for the appropriate day and plan matches scheduled date. Marland Kitchen Appropriate non-routine labs are ordered dependent on drug ordered. . If applicable, additional medications reviewed and ordered per protocol based on lifetime cumulative doses and/or treatment regimen.   Plan for follow-up and/or issues identified: Yes . I-vent associated with next due treatment: Yes . MD and/or nursing notified: Hoy Register 11/03/2019 3:57 PM

## 2019-11-04 ENCOUNTER — Telehealth: Payer: Self-pay | Admitting: Neurology

## 2019-11-04 ENCOUNTER — Telehealth: Payer: Self-pay | Admitting: Hematology

## 2019-11-04 ENCOUNTER — Telehealth: Payer: Self-pay | Admitting: *Deleted

## 2019-11-04 ENCOUNTER — Other Ambulatory Visit: Payer: Self-pay | Admitting: Hematology

## 2019-11-04 DIAGNOSIS — C9000 Multiple myeloma not having achieved remission: Secondary | ICD-10-CM

## 2019-11-04 NOTE — Telephone Encounter (Signed)
Dr. Irene Limbo ordered Port placement. Per IR, patient will need to hold Plavix for 5 days prior to port placement. Contacted Dr. Krista Blue at [facility].  She gave verbal order for patient to hold Plavix for 5 days prior to port placement and resume on day after.

## 2019-11-04 NOTE — Telephone Encounter (Signed)
Scheduled per 04/14 los, patient has been called and notified of upcoming appointments.

## 2019-11-04 NOTE — Telephone Encounter (Signed)
I talked with [name] from Dr. Tiajuana Amass at cancer center, who is treating patient for multiple myeloma, needing Port-A-Cath placement, it is okay to hold off Plavix for 5 days prior to surgery, resume Plavix afterwards

## 2019-11-05 ENCOUNTER — Other Ambulatory Visit: Payer: Self-pay | Admitting: Hematology

## 2019-11-05 ENCOUNTER — Telehealth: Payer: Self-pay | Admitting: *Deleted

## 2019-11-05 DIAGNOSIS — H00024 Hordeolum internum left upper eyelid: Secondary | ICD-10-CM | POA: Diagnosis not present

## 2019-11-05 DIAGNOSIS — C9000 Multiple myeloma not having achieved remission: Secondary | ICD-10-CM

## 2019-11-05 DIAGNOSIS — H00022 Hordeolum internum right lower eyelid: Secondary | ICD-10-CM | POA: Diagnosis not present

## 2019-11-05 DIAGNOSIS — Z7189 Other specified counseling: Secondary | ICD-10-CM

## 2019-11-05 DIAGNOSIS — H00021 Hordeolum internum right upper eyelid: Secondary | ICD-10-CM | POA: Diagnosis not present

## 2019-11-05 MED ORDER — LIDOCAINE-PRILOCAINE 2.5-2.5 % EX CREA: TOPICAL_CREAM | CUTANEOUS | 3 refills | Status: DC

## 2019-11-05 MED ORDER — DEXAMETHASONE 4 MG PO TABS: ORAL_TABLET | ORAL | 4 refills | Status: DC

## 2019-11-05 MED ORDER — ONDANSETRON HCL 8 MG PO TABS: 8.0000 mg | ORAL_TABLET | Freq: Two times a day (BID) | ORAL | 1 refills | Status: DC | PRN

## 2019-11-05 MED ORDER — LORAZEPAM 0.5 MG PO TABS: 0.5000 mg | ORAL_TABLET | Freq: Four times a day (QID) | ORAL | 0 refills | Status: DC | PRN

## 2019-11-05 MED ORDER — ACYCLOVIR 400 MG PO TABS: 400.0000 mg | ORAL_TABLET | Freq: Two times a day (BID) | ORAL | 3 refills | Status: DC

## 2019-11-05 MED ORDER — PROCHLORPERAZINE MALEATE 10 MG PO TABS: 10.0000 mg | ORAL_TABLET | Freq: Four times a day (QID) | ORAL | 1 refills | Status: DC | PRN

## 2019-11-05 NOTE — Telephone Encounter (Signed)
Contacted patient with following information: Patient scheduled for port placement at [facility] IR on 11/19/19 at 10:30 am - confirmed with Anderson Malta     Pt to arrive at [facility] at 10:30 am, NPO, needs driver, may take am meds  Medication instructions:  Patient will hold Revlimid for 5 days (per Dr. Irene Limbo) - last dose 11/13/19 Patient will hold Plavix for 5 days (per Dr. Krista Blue) - last dose 11/13/19 Patient will bridge with Lovenox (will be ordered by Dr.Kale) 4/25 through 4/29  Patient verbalized understanding of all directions and read med instructions back to caller.

## 2019-11-05 NOTE — Telephone Encounter (Signed)
Contacted by Dr. Quentin Ore, ophthalmologist with [facility]. Patient still has multiple styes. Dr.Weaver asking for Dr. Grier Mitts ok to give Keflex 500 mg TID for 10 days. Dr. Irene Limbo informed. Contacted Dr. Kathlen Mody with Dr. Irene Limbo response - ok to give keflex. Dr. Kathlen Mody states he will also send office visit notes to Dr. Irene Limbo

## 2019-11-05 NOTE — Progress Notes (Signed)
ON PATHWAY REGIMEN - Multiple Myeloma and Other Plasma Cell Dyscrasias  No Change  Continue With Treatment as Ordered.     A cycle is every 21 days:     Bortezomib      Lenalidomide      Dexamethasone   **Always confirm dose/schedule in your pharmacy ordering system**  Patient Characteristics: Newly Diagnosed, Transplant Eligible, Unknown or Awaiting Test Results R-ISS Staging: Unknown Disease Classification: Newly Diagnosed Is Patient Eligible for Transplant<= Transplant Eligible Risk Status: Awaiting Test Results Intent of Therapy: Non-Curative / Palliative Intent, Discussed with Patient

## 2019-11-05 NOTE — Progress Notes (Signed)
DISCONTINUE ON PATHWAY REGIMEN - Multiple Myeloma and Other Plasma Cell Dyscrasias     A cycle is every 21 days:     Bortezomib      Lenalidomide      Dexamethasone   **Always confirm dose/schedule in your pharmacy ordering system**  REASON: Toxicities / Adverse Event PRIOR TREATMENT: [id number]: VRd (Bortezomib 1.3 mg/m2 Subcut D1, 8, 15 + Lenalidomide 25 mg + Dexamethasone 40 mg) q21 Days x 4-6 Cycles Maximum Prior to [facility] [employer] TREATMENT RESPONSE: Partial Response (PR)  START ON PATHWAY REGIMEN - Multiple Myeloma and Other Plasma Cell Dyscrasias     A cycle is every 28 days:     Carfilzomib      Carfilzomib      Carfilzomib      Dexamethasone      Dexamethasone      Lenalidomide   **Always confirm dose/schedule in your pharmacy ordering system**  Patient Characteristics: Multiple Myeloma, Newly Diagnosed, Transplant Eligible, Standard Risk Disease Classification: Multiple Myeloma R-ISS Staging: Unknown Therapeutic Status: Newly Diagnosed Is Patient Eligible for Transplant<= Transplant Eligible Risk Status: Standard Risk Intent of Therapy: Non-Curative / Palliative Intent, Discussed with Patient 

## 2019-11-09 ENCOUNTER — Other Ambulatory Visit: Payer: Self-pay | Admitting: Hematology

## 2019-11-09 ENCOUNTER — Other Ambulatory Visit: Payer: Self-pay

## 2019-11-09 ENCOUNTER — Inpatient Hospital Stay: Payer: Medicare Other

## 2019-11-09 VITALS — BP 140/75 | HR 75 | Temp 97.3°F | Resp 18

## 2019-11-09 DIAGNOSIS — C7951 Secondary malignant neoplasm of bone: Secondary | ICD-10-CM

## 2019-11-09 DIAGNOSIS — C9000 Multiple myeloma not having achieved remission: Secondary | ICD-10-CM

## 2019-11-09 DIAGNOSIS — Z7189 Other specified counseling: Secondary | ICD-10-CM

## 2019-11-09 DIAGNOSIS — D649 Anemia, unspecified: Secondary | ICD-10-CM | POA: Diagnosis not present

## 2019-11-09 DIAGNOSIS — Z5112 Encounter for antineoplastic immunotherapy: Secondary | ICD-10-CM | POA: Diagnosis not present

## 2019-11-09 LAB — CBC WITH DIFFERENTIAL/PLATELET
Abs Immature Granulocytes: 0.02 10*3/uL (ref 0.00–0.07)
Basophils Absolute: 0 10*3/uL (ref 0.0–0.1)
Basophils Relative: 0 %
Eosinophils Absolute: 0.1 10*3/uL (ref 0.0–0.5)
Eosinophils Relative: 1 %
HCT: 33.7 % — ABNORMAL LOW (ref 39.0–52.0)
Hemoglobin: 11.3 g/dL — ABNORMAL LOW (ref 13.0–17.0)
Immature Granulocytes: 0 %
Lymphocytes Relative: 32 %
Lymphs Abs: 2.8 10*3/uL (ref 0.7–4.0)
MCH: 33.9 pg (ref 26.0–34.0)
MCHC: 33.5 g/dL (ref 30.0–36.0)
MCV: 101.2 fL — ABNORMAL HIGH (ref 80.0–100.0)
Monocytes Absolute: 0.8 10*3/uL (ref 0.1–1.0)
Monocytes Relative: 10 %
Neutro Abs: 4.8 10*3/uL (ref 1.7–7.7)
Neutrophils Relative %: 57 %
Platelets: 207 10*3/uL (ref 150–400)
RBC: 3.33 MIL/uL — ABNORMAL LOW (ref 4.22–5.81)
RDW: 13.8 % (ref 11.5–15.5)
WBC: 8.5 10*3/uL (ref 4.0–10.5)
nRBC: 0 % (ref 0.0–0.2)

## 2019-11-09 LAB — CMP (CANCER CENTER ONLY)
ALT: 14 U/L (ref 0–44)
AST: 21 U/L (ref 15–41)
Albumin: 3.7 g/dL (ref 3.5–5.0)
Alkaline Phosphatase: 68 U/L (ref 38–126)
Anion gap: 8 (ref 5–15)
BUN: 25 mg/dL — ABNORMAL HIGH (ref 8–23)
CO2: 22 mmol/L (ref 22–32)
Calcium: 9.1 mg/dL (ref 8.9–10.3)
Chloride: 106 mmol/L (ref 98–111)
Creatinine: 1.55 mg/dL — ABNORMAL HIGH (ref 0.61–1.24)
GFR, Est AFR Am: 51 mL/min — ABNORMAL LOW (ref >60)
GFR, Estimated: 44 mL/min — ABNORMAL LOW (ref >60)
Glucose, Bld: 68 mg/dL — ABNORMAL LOW (ref 70–99)
Potassium: 3.8 mmol/L (ref 3.5–5.1)
Sodium: 136 mmol/L (ref 135–145)
Total Bilirubin: 0.5 mg/dL (ref 0.3–1.2)
Total Protein: 8.1 g/dL (ref 6.5–8.1)

## 2019-11-09 MED ORDER — SODIUM CHLORIDE 0.9 % IV SOLN
Freq: Once | INTRAVENOUS | Status: AC
  Filled 2019-11-09: qty 250

## 2019-11-09 MED ORDER — ENOXAPARIN SODIUM 60 MG/0.6ML ~~LOC~~ SOLN: 60.0000 mg | SUBCUTANEOUS | 0 refills | Status: DC

## 2019-11-09 MED ORDER — SODIUM CHLORIDE 0.9 % IV SOLN
40.0000 mg | Freq: Once | INTRAVENOUS | Status: AC
  Administered 2019-11-09: 40 mg via INTRAVENOUS
  Filled 2019-11-09: qty 4

## 2019-11-09 MED ORDER — DEXTROSE 5 % IV SOLN
18.0000 mg/m2 | Freq: Once | INTRAVENOUS | Status: AC
  Administered 2019-11-09: 40 mg via INTRAVENOUS
  Filled 2019-11-09: qty 5

## 2019-11-09 NOTE — Progress Notes (Signed)
Per Dr Irene Limbo: okay to treat pt. with BUN of 25 and Scr. of 1.55

## 2019-11-09 NOTE — Progress Notes (Signed)
Proceed with treatment per PA team in setting of pending auth.   Demetrius Charity, PharmD, BCPS, [facility] Pharmacist Pharmacy Phone: [phone] 11/09/2019

## 2019-11-09 NOTE — Progress Notes (Signed)
First time kyprolis. Pt. Tolerated without issue. Nurtrition offered.

## 2019-11-09 NOTE — Patient Instructions (Signed)
[facility] Discharge Instructions for Patients Receiving Chemotherapy  Today you received the following chemotherapy agents kyprolis  To help prevent nausea and vomiting after your treatment, we encourage you to take your nausea medication.   If you develop nausea and vomiting that is not controlled by your nausea medication, call the clinic.   BELOW ARE SYMPTOMS THAT SHOULD BE REPORTED IMMEDIATELY:  *FEVER GREATER THAN 100.5 F  *CHILLS WITH OR WITHOUT FEVER  NAUSEA AND VOMITING THAT IS NOT CONTROLLED WITH YOUR NAUSEA MEDICATION  *UNUSUAL SHORTNESS OF BREATH  *UNUSUAL BRUISING OR BLEEDING  TENDERNESS IN MOUTH AND THROAT WITH OR WITHOUT PRESENCE OF ULCERS  *URINARY PROBLEMS  *BOWEL PROBLEMS  UNUSUAL RASH Items with * indicate a potential emergency and should be followed up as soon as possible.  Feel free to call the clinic should you have any questions or concerns. The clinic phone number is [phone].  Please show the [place] at check-in to the Emergency Department and triage nurse.  Carfilzomib injection What is this medicine? CARFILZOMIB (kar FILZ oh mib) targets a specific protein within cancer cells and stops the cancer cells from growing. It is used to treat multiple myeloma. This medicine may be used for other purposes; ask your health care provider or pharmacist if you have questions. COMMON BRAND NAME(S): KYPROLIS What should I tell my health care provider before I take this medicine? They need to know if you have any of these conditions:  heart disease  history of blood clots  irregular heartbeat  kidney disease  liver disease  lung or breathing disease  an unusual or allergic reaction to carfilzomib, or other medicines, foods, dyes, or preservatives  pregnant or trying to get pregnant  breast-feeding How should I use this medicine? This medicine is for injection or infusion into a vein. It is given by a health care  professional in a hospital or clinic setting. Talk to your pediatrician regarding the use of this medicine in children. Special care may be needed. Overdosage: If you think you have taken too much of this medicine contact a poison control center or emergency room at once. NOTE: This medicine is only for you. Do not share this medicine with others. What if I miss a dose? It is important not to miss your dose. Call your doctor or health care professional if you are unable to keep an appointment. What may interact with this medicine? Interactions are not expected. Give your health care provider a list of all the medicines, herbs, non-prescription drugs, or dietary supplements you use. Also tell them if you smoke, drink alcohol, or use illegal drugs. Some items may interact with your medicine. This list may not describe all possible interactions. Give your health care provider a list of all the medicines, herbs, non-prescription drugs, or dietary supplements you use. Also tell them if you smoke, drink alcohol, or use illegal drugs. Some items may interact with your medicine. What should I watch for while using this medicine? Your condition will be monitored carefully while you are receiving this medicine. Report any side effects. Continue your course of treatment even though you feel ill unless your doctor tells you to stop. You may need blood work done while you are taking this medicine. Do not become pregnant while taking this medicine or for at least 6 months after stopping it. Women should inform their doctor if they wish to become pregnant or think they might be pregnant. There is a potential for  serious side effects to an unborn child. Men should not father a child while taking this medicine and for at least 3 months after stopping it. Talk to your health care professional or pharmacist for more information. Do not breast-feed an infant while taking this medicine or for 2 weeks after the last  dose. Check with your doctor or health care professional if you get an attack of severe diarrhea, nausea and vomiting, or if you sweat a lot. The loss of too much body fluid can make it dangerous for you to take this medicine. You may get dizzy. Do not drive, use machinery, or do anything that needs mental alertness until you know how this medicine affects you. Do not stand or sit up quickly, especially if you are an older patient. This reduces the risk of dizzy or fainting spells. What side effects may I notice from receiving this medicine? Side effects that you should report to your doctor or health care professional as soon as possible:  allergic reactions like skin rash, itching or hives, swelling of the face, lips, or tongue  confusion  dizziness  feeling faint or lightheaded  fever or chills  palpitations  seizures  signs and symptoms of bleeding such as bloody or black, tarry stools; red or dark-brown urine; spitting up blood or brown material that looks like coffee grounds; red spots on the skin; unusual bruising or bleeding including from the eye, gums, or nose  signs and symptoms of a blood clot such as breathing problems; changes in vision; chest pain; severe, sudden headache; pain, swelling, warmth in the leg; trouble speaking; sudden numbness or weakness of the face, arm or leg  signs and symptoms of kidney injury like trouble passing urine or change in the amount of urine  signs and symptoms of liver injury like dark yellow or brown urine; general ill feeling or flu-like symptoms; light-colored stools; loss of appetite; nausea; right upper belly pain; unusually weak or tired; yellowing of the eyes or skin Side effects that usually do not require medical attention (report to your doctor or health care professional if they continue or are bothersome):  back pain  cough  diarrhea  headache  muscle cramps  trouble sleeping  vomiting This list may not describe all  possible side effects. Call your doctor for medical advice about side effects. You may report side effects to FDA at 1-800-FDA-1088. Where should I keep my medicine? This drug is given in a hospital or clinic and will not be stored at home. NOTE: This sheet is a summary. It may not cover all possible information. If you have questions about this medicine, talk to your doctor, pharmacist, or health care provider.  2020 [employer]vier/Gold Standard (2019-03-15 19:44:21)
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
## 2019-11-12 ENCOUNTER — Other Ambulatory Visit: Payer: Self-pay | Admitting: *Deleted

## 2019-11-12 DIAGNOSIS — C9000 Multiple myeloma not having achieved remission: Secondary | ICD-10-CM

## 2019-11-12 MED ORDER — LENALIDOMIDE 15 MG PO CAPS: 15.0000 mg | ORAL_CAPSULE | Freq: Every day | ORAL | 0 refills | Status: DC

## 2019-11-12 NOTE — Telephone Encounter (Signed)
Refill Revlimid per Dr. Irene Limbo OV note 4/13 - Next refill: switch 15 mg Revlimid to 3 weeks on, 1 week off (change from 2 weeks on-1 week off).  Refill escribed to [facility] by [facility]ley Gambles, [place] - [id number] [facility] Auth # [id number], 11/12/19

## 2019-11-15 ENCOUNTER — Inpatient Hospital Stay: Payer: Medicare Other

## 2019-11-15 ENCOUNTER — Other Ambulatory Visit: Payer: Medicare Other

## 2019-11-15 ENCOUNTER — Other Ambulatory Visit: Payer: Self-pay

## 2019-11-15 ENCOUNTER — Ambulatory Visit: Payer: Medicare Other

## 2019-11-15 ENCOUNTER — Inpatient Hospital Stay: Payer: Medicare Other | Admitting: Hematology

## 2019-11-15 ENCOUNTER — Encounter: Payer: Self-pay | Admitting: Hematology

## 2019-11-15 VITALS — BP 110/61 | HR 77 | Temp 98.0°F | Resp 17 | Ht 70.0 in | Wt 220.0 lb

## 2019-11-15 DIAGNOSIS — C7951 Secondary malignant neoplasm of bone: Secondary | ICD-10-CM | POA: Diagnosis not present

## 2019-11-15 DIAGNOSIS — Z7189 Other specified counseling: Secondary | ICD-10-CM

## 2019-11-15 DIAGNOSIS — D649 Anemia, unspecified: Secondary | ICD-10-CM | POA: Diagnosis not present

## 2019-11-15 DIAGNOSIS — C9000 Multiple myeloma not having achieved remission: Secondary | ICD-10-CM

## 2019-11-15 DIAGNOSIS — Z5111 Encounter for antineoplastic chemotherapy: Secondary | ICD-10-CM | POA: Diagnosis not present

## 2019-11-15 DIAGNOSIS — Z5112 Encounter for antineoplastic immunotherapy: Secondary | ICD-10-CM | POA: Diagnosis not present

## 2019-11-15 LAB — CMP (CANCER CENTER ONLY)
ALT: 16 U/L (ref 0–44)
AST: 20 U/L (ref 15–41)
Albumin: 3.5 g/dL (ref 3.5–5.0)
Alkaline Phosphatase: 67 U/L (ref 38–126)
Anion gap: 10 (ref 5–15)
BUN: 17 mg/dL (ref 8–23)
CO2: 25 mmol/L (ref 22–32)
Calcium: 9.1 mg/dL (ref 8.9–10.3)
Chloride: 106 mmol/L (ref 98–111)
Creatinine: 1.56 mg/dL — ABNORMAL HIGH (ref 0.61–1.24)
GFR, Est AFR Am: 50 mL/min — ABNORMAL LOW (ref >60)
GFR, Estimated: 43 mL/min — ABNORMAL LOW (ref >60)
Glucose, Bld: 131 mg/dL — ABNORMAL HIGH (ref 70–99)
Potassium: 4.1 mmol/L (ref 3.5–5.1)
Sodium: 141 mmol/L (ref 135–145)
Total Bilirubin: 0.6 mg/dL (ref 0.3–1.2)
Total Protein: 7.5 g/dL (ref 6.5–8.1)

## 2019-11-15 LAB — CBC WITH DIFFERENTIAL/PLATELET
Abs Immature Granulocytes: 0 10*3/uL (ref 0.00–0.07)
Basophils Absolute: 0.1 10*3/uL (ref 0.0–0.1)
Basophils Relative: 1 %
Eosinophils Absolute: 0.2 10*3/uL (ref 0.0–0.5)
Eosinophils Relative: 5 %
HCT: 35 % — ABNORMAL LOW (ref 39.0–52.0)
Hemoglobin: 11.7 g/dL — ABNORMAL LOW (ref 13.0–17.0)
Immature Granulocytes: 0 %
Lymphocytes Relative: 41 %
Lymphs Abs: 2.2 10*3/uL (ref 0.7–4.0)
MCH: 33.4 pg (ref 26.0–34.0)
MCHC: 33.4 g/dL (ref 30.0–36.0)
MCV: 100 fL (ref 80.0–100.0)
Monocytes Absolute: 0.9 10*3/uL (ref 0.1–1.0)
Monocytes Relative: 17 %
Neutro Abs: 1.9 10*3/uL (ref 1.7–7.7)
Neutrophils Relative %: 36 %
Platelets: 172 10*3/uL (ref 150–400)
RBC: 3.5 MIL/uL — ABNORMAL LOW (ref 4.22–5.81)
RDW: 14.2 % (ref 11.5–15.5)
WBC: 5.2 10*3/uL (ref 4.0–10.5)
nRBC: 0 % (ref 0.0–0.2)

## 2019-11-15 MED ORDER — SODIUM CHLORIDE 0.9 % IV SOLN
Freq: Once | INTRAVENOUS | Status: AC
  Filled 2019-11-15: qty 250

## 2019-11-15 MED ORDER — SODIUM CHLORIDE 0.9 % IV SOLN
40.0000 mg | Freq: Once | INTRAVENOUS | Status: AC
  Administered 2019-11-15: 40 mg via INTRAVENOUS
  Filled 2019-11-15: qty 4

## 2019-11-15 MED ORDER — DEXTROSE 5 % IV SOLN
27.0000 mg/m2 | Freq: Once | INTRAVENOUS | Status: AC
  Administered 2019-11-15: 60 mg via INTRAVENOUS
  Filled 2019-11-15: qty 30

## 2019-11-15 NOTE — Progress Notes (Signed)
Verbal Order - per Dr. Irene Limbo - ok to receive Carfilzomib today with Creatinine 1.56

## 2019-11-15 NOTE — Patient Instructions (Signed)
[facility] Discharge Instructions for Patients Receiving Chemotherapy  Today you received the following chemotherapy agents:  Kyprolis  To help prevent nausea and vomiting after your treatment, we encourage you to take your nausea medication as directed.   If you develop nausea and vomiting that is not controlled by your nausea medication, call the clinic.   BELOW ARE SYMPTOMS THAT SHOULD BE REPORTED IMMEDIATELY:  *FEVER GREATER THAN 100.5 F  *CHILLS WITH OR WITHOUT FEVER  NAUSEA AND VOMITING THAT IS NOT CONTROLLED WITH YOUR NAUSEA MEDICATION  *UNUSUAL SHORTNESS OF BREATH  *UNUSUAL BRUISING OR BLEEDING  TENDERNESS IN MOUTH AND THROAT WITH OR WITHOUT PRESENCE OF ULCERS  *URINARY PROBLEMS  *BOWEL PROBLEMS  UNUSUAL RASH Items with * indicate a potential emergency and should be followed up as soon as possible.  Feel free to call the clinic should you have any questions or concerns. The clinic phone number is [phone].  Please show the CHEMO ALERT CARD at check-in to the Emergency Department and triage nurse.   

## 2019-11-15 NOTE — Patient Instructions (Signed)
-  Please start next cycle of Revlimid 15mg  3 weeks on 1 week off from [place] on 12/07/2019

## 2019-11-16 ENCOUNTER — Telehealth: Payer: Self-pay | Admitting: *Deleted

## 2019-11-16 NOTE — Telephone Encounter (Signed)
Received mailed document from [place] on behalf of [employer]: Carfilzomib injection approved - Service Code [phone], Approved units/visits/days 999, From 11/09/19 through 11/08/20 Paperwork sent to [facility] to be scanned in to patient record

## 2019-11-17 ENCOUNTER — Other Ambulatory Visit: Payer: Self-pay | Admitting: Radiology

## 2019-11-17 ENCOUNTER — Other Ambulatory Visit: Payer: Self-pay | Admitting: Hematology

## 2019-11-17 NOTE — Progress Notes (Signed)

## 2019-11-17 NOTE — Telephone Encounter (Signed)
Please review for refill.  

## 2019-11-19 ENCOUNTER — Encounter (HOSPITAL_COMMUNITY): Payer: Self-pay

## 2019-11-19 ENCOUNTER — Other Ambulatory Visit: Payer: Self-pay

## 2019-11-19 ENCOUNTER — Ambulatory Visit (HOSPITAL_COMMUNITY): Payer: Medicare Other

## 2019-11-19 ENCOUNTER — Other Ambulatory Visit: Payer: Self-pay | Admitting: Hematology

## 2019-11-19 ENCOUNTER — Ambulatory Visit (HOSPITAL_COMMUNITY)
Admission: RE | Admit: 2019-11-19 | Discharge: 2019-11-19 | Disposition: A | Discharge status: Home / Self Care | Payer: Medicare Other | Source: Ambulatory Visit | Attending: Hematology | Admitting: Hematology

## 2019-11-19 DIAGNOSIS — M1990 Unspecified osteoarthritis, unspecified site: Secondary | ICD-10-CM | POA: Diagnosis not present

## 2019-11-19 DIAGNOSIS — Z79899 Other long term (current) drug therapy: Secondary | ICD-10-CM | POA: Insufficient documentation

## 2019-11-19 DIAGNOSIS — C9000 Multiple myeloma not having achieved remission: Secondary | ICD-10-CM

## 2019-11-19 DIAGNOSIS — G4730 Sleep apnea, unspecified: Secondary | ICD-10-CM | POA: Insufficient documentation

## 2019-11-19 DIAGNOSIS — N400 Enlarged prostate without lower urinary tract symptoms: Secondary | ICD-10-CM | POA: Insufficient documentation

## 2019-11-19 DIAGNOSIS — Z801 Family history of malignant neoplasm of trachea, bronchus and lung: Secondary | ICD-10-CM | POA: Diagnosis not present

## 2019-11-19 DIAGNOSIS — N189 Chronic kidney disease, unspecified: Secondary | ICD-10-CM | POA: Insufficient documentation

## 2019-11-19 DIAGNOSIS — M109 Gout, unspecified: Secondary | ICD-10-CM | POA: Diagnosis not present

## 2019-11-19 DIAGNOSIS — M4120 Other idiopathic scoliosis, site unspecified: Secondary | ICD-10-CM | POA: Diagnosis not present

## 2019-11-19 DIAGNOSIS — Z7902 Long term (current) use of antithrombotics/antiplatelets: Secondary | ICD-10-CM | POA: Diagnosis not present

## 2019-11-19 DIAGNOSIS — I129 Hypertensive chronic kidney disease with stage 1 through stage 4 chronic kidney disease, or unspecified chronic kidney disease: Secondary | ICD-10-CM | POA: Diagnosis not present

## 2019-11-19 DIAGNOSIS — Z5111 Encounter for antineoplastic chemotherapy: Secondary | ICD-10-CM | POA: Diagnosis not present

## 2019-11-19 DIAGNOSIS — Z8249 Family history of ischemic heart disease and other diseases of the circulatory system: Secondary | ICD-10-CM | POA: Insufficient documentation

## 2019-11-19 DIAGNOSIS — H269 Unspecified cataract: Secondary | ICD-10-CM | POA: Insufficient documentation

## 2019-11-19 HISTORY — PX: IR IMAGING GUIDED PORT INSERTION: IMG5740

## 2019-11-19 MED ORDER — LIDOCAINE HCL 1 % IJ SOLN
INTRAMUSCULAR | Status: AC
  Filled 2019-11-19: qty 20

## 2019-11-19 MED ORDER — CEFAZOLIN SODIUM-DEXTROSE 2-4 GM/100ML-% IV SOLN
INTRAVENOUS | Status: AC
  Filled 2019-11-19: qty 100

## 2019-11-19 MED ORDER — CEFAZOLIN SODIUM-DEXTROSE 2-4 GM/100ML-% IV SOLN: 2.0000 g | INTRAVENOUS | Status: AC

## 2019-11-19 MED ORDER — MIDAZOLAM HCL 2 MG/2ML IJ SOLN
INTRAMUSCULAR | Status: AC
  Filled 2019-11-19: qty 2

## 2019-11-19 MED ORDER — MIDAZOLAM HCL 2 MG/2ML IJ SOLN
INTRAMUSCULAR | Status: AC | PRN
  Administered 2019-11-19: 1 mg via INTRAVENOUS

## 2019-11-19 MED ORDER — FENTANYL CITRATE (PF) 100 MCG/2ML IJ SOLN
INTRAMUSCULAR | Status: AC
  Filled 2019-11-19: qty 2

## 2019-11-19 MED ORDER — HEPARIN SOD (PORK) LOCK FLUSH 100 UNIT/ML IV SOLN
INTRAVENOUS | Status: AC | PRN
  Administered 2019-11-19: 500 [IU] via INTRAVENOUS

## 2019-11-19 MED ORDER — HEPARIN SOD (PORK) LOCK FLUSH 100 UNIT/ML IV SOLN
INTRAVENOUS | Status: AC
  Filled 2019-11-19: qty 5

## 2019-11-19 MED ORDER — SODIUM CHLORIDE 0.9 % IV SOLN: INTRAVENOUS | Status: DC

## 2019-11-19 MED ORDER — CHLORHEXIDINE GLUCONATE 4 % EX LIQD
CUTANEOUS | Status: AC
  Filled 2019-11-19: qty 15

## 2019-11-19 MED ORDER — LIDOCAINE HCL 1 % IJ SOLN
INTRAMUSCULAR | Status: AC | PRN
  Administered 2019-11-19: 20 mL

## 2019-11-19 MED ORDER — FENTANYL CITRATE (PF) 100 MCG/2ML IJ SOLN
INTRAMUSCULAR | Status: AC | PRN
  Administered 2019-11-19: 50 ug via INTRAVENOUS

## 2019-11-19 MED ORDER — CEFAZOLIN SODIUM-DEXTROSE 2-4 GM/100ML-% IV SOLN: 2.0000 g | Freq: Once | INTRAVENOUS | Status: AC

## 2019-11-19 MED ORDER — CEFAZOLIN SODIUM-DEXTROSE 2-4 GM/100ML-% IV SOLN
INTRAVENOUS | Status: AC | PRN
  Administered 2019-11-19: 2 g via INTRAVENOUS

## 2019-11-19 NOTE — Procedures (Signed)
Interventional Radiology Procedure Note  Procedure: Single Lumen Power Port Placement    Access:  Right IJ vein.  Findings: Catheter tip positioned at SVC/RA junction. Port is ready for immediate use.   Complications: None  EBL: < 10 mL  Recommendations:  - Ok to shower in 24 hours - Do not submerge for 7 days - Routine line care   Tkai Serfass T. Pritesh Sobecki, M.D Pager:  [phone]   
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
## 2019-11-19 NOTE — Discharge Instructions (Addendum)
Implanted Port Insertion, Care After This sheet gives you information about how to care for yourself after your procedure. Your health care provider may also give you more specific instructions. If you have problems or questions, contact your health care provider. What can I expect after the procedure? After the procedure, it is common to have:  Discomfort at the port insertion site.  Bruising on the skin over the port. This should improve over 3-4 days. Follow these instructions at home: Port care  After your port is placed, you will get a manufacturer's information card. The card has information about your port. Keep this card with you at all times.  Take care of the port as told by your health care provider. Ask your health care provider if you or a family member can get training for taking care of the port at home. A home health care nurse may also take care of the port.  Make sure to remember what type of port you have. Incision care      Follow instructions from your health care provider about how to take care of your port insertion site. Make sure you: ? Wash your hands with soap and water before and after you change your bandage (dressing). If soap and water are not available, use hand sanitizer. ? Change your dressing as told by your health care provider. ? Leave stitches (sutures), skin glue, or adhesive strips in place. These skin closures may need to stay in place for 2 weeks or longer. If adhesive strip edges start to loosen and curl up, you may trim the loose edges. Do not remove adhesive strips completely unless your health care provider tells you to do that.  Check your port insertion site every day for signs of infection. Check for: ? Redness, swelling, or pain. ? Fluid or blood. ? Warmth. ? Pus or a bad smell. Activity  Return to your normal activities as told by your health care provider. Ask your health care provider what activities are safe for you.  Do not  lift anything that is heavier than 10 lb (4.5 kg), or the limit that you are told, until your health care provider says that it is safe. General instructions  Take over-the-counter and prescription medicines only as told by your health care provider.  Do not take baths, swim, or use a hot tub until your health care provider approves. Ask your health care provider if you may take showers. You may only be allowed to take sponge baths.  Do not drive for 24 hours if you were given a sedative during your procedure.  Wear a medical alert bracelet in case of an emergency. This will tell any health care providers that you have a port.  Keep all follow-up visits as told by your health care provider. This is important. Contact a health care provider if:  You cannot flush your port with saline as directed, or you cannot draw blood from the port.  You have a fever or chills.  You have redness, swelling, or pain around your port insertion site.  You have fluid or blood coming from your port insertion site.  Your port insertion site feels warm to the touch.  You have pus or a bad smell coming from the port insertion site. Get help right away if:  You have chest pain or shortness of breath.  You have bleeding from your port that you cannot control. Summary  Take care of the port as told by your health   care provider. Keep the manufacturer's information card with you at all times.  Change your dressing as told by your health care provider.  Contact a health care provider if you have a fever or chills or if you have redness, swelling, or pain around your port insertion site.  Keep all follow-up visits as told by your health care provider. This information is not intended to replace advice given to you by your health care provider. Make sure you discuss any questions you have with your health care provider. Document Revised: 02/03/2018 Document Reviewed: 02/03/2018 Elsevier Patient Education   2020 Elsevier Inc. Moderate Conscious Sedation, Adult Sedation is the use of medicines to promote relaxation and relieve discomfort and anxiety. Moderate conscious sedation is a type of sedation. Under moderate conscious sedation, you are less alert than normal, but you are still able to respond to instructions, touch, or both. Moderate conscious sedation is used during short medical and dental procedures. It is milder than deep sedation, which is a type of sedation under which you cannot be easily woken up. It is also milder than general anesthesia, which is the use of medicines to make you unconscious. Moderate conscious sedation allows you to return to your regular activities sooner. Tell a health care provider about:  Any allergies you have.  All medicines you are taking, including vitamins, herbs, eye drops, creams, and over-the-counter medicines.  Use of steroids (by mouth or creams).  Any problems you or family members have had with sedatives and anesthetic medicines.  Any blood disorders you have.  Any surgeries you have had.  Any medical conditions you have, such as sleep apnea.  Whether you are pregnant or may be pregnant.  Any use of cigarettes, alcohol, marijuana, or street drugs. What are the risks? Generally, this is a safe procedure. However, problems may occur, including:  Getting too much medicine (oversedation).  Nausea.  Allergic reaction to medicines.  Trouble breathing. If this happens, a breathing tube may be used to help with breathing. It will be removed when you are awake and breathing on your own.  Heart trouble.  Lung trouble. What happens before the procedure? Staying hydrated Follow instructions from your health care provider about hydration, which may include:  Up to 2 hours before the procedure - you may continue to drink clear liquids, such as water, clear fruit juice, black coffee, and plain tea. Eating and drinking restrictions Follow  instructions from your health care provider about eating and drinking, which may include:  8 hours before the procedure - stop eating heavy meals or foods such as meat, fried foods, or fatty foods.  6 hours before the procedure - stop eating light meals or foods, such as toast or cereal.  6 hours before the procedure - stop drinking milk or drinks that contain milk.  2 hours before the procedure - stop drinking clear liquids. Medicine Ask your health care provider about:  Changing or stopping your regular medicines. This is especially important if you are taking diabetes medicines or blood thinners.  Taking medicines such as aspirin and ibuprofen. These medicines can thin your blood. Do not take these medicines before your procedure if your health care provider instructs you not to.  Tests and exams  You will have a physical exam.  You may have blood tests done to show: ? How well your kidneys and liver are working. ? How well your blood can clot. General instructions  Plan to have someone take you home from the   hospital or clinic.  If you will be going home right after the procedure, plan to have someone with you for 24 hours. What happens during the procedure?  An IV tube will be inserted into one of your veins.  Medicine to help you relax (sedative) will be given through the IV tube.  The medical or dental procedure will be performed. What happens after the procedure?  Your blood pressure, heart rate, breathing rate, and blood oxygen level will be monitored often until the medicines you were given have worn off.  Do not drive for 24 hours. This information is not intended to replace advice given to you by your health care provider. Make sure you discuss any questions you have with your health care provider. Document Revised: 06/20/2017 Document Reviewed: 10/28/2015 Elsevier Patient Education  2020 Elsevier Inc.  

## 2019-11-22 ENCOUNTER — Encounter (HOSPITAL_COMMUNITY): Payer: Self-pay

## 2019-11-23 ENCOUNTER — Inpatient Hospital Stay: Payer: Medicare Other | Attending: Hematology

## 2019-11-23 ENCOUNTER — Inpatient Hospital Stay: Payer: Medicare Other

## 2019-11-23 ENCOUNTER — Ambulatory Visit: Payer: Medicare Other

## 2019-11-23 ENCOUNTER — Other Ambulatory Visit: Payer: Self-pay

## 2019-11-23 ENCOUNTER — Other Ambulatory Visit: Payer: Medicare Other

## 2019-11-23 VITALS — BP 135/87 | HR 74 | Temp 98.9°F | Resp 18

## 2019-11-23 DIAGNOSIS — Z79899 Other long term (current) drug therapy: Secondary | ICD-10-CM | POA: Diagnosis not present

## 2019-11-23 DIAGNOSIS — Z5112 Encounter for antineoplastic immunotherapy: Secondary | ICD-10-CM | POA: Insufficient documentation

## 2019-11-23 DIAGNOSIS — C9000 Multiple myeloma not having achieved remission: Secondary | ICD-10-CM

## 2019-11-23 DIAGNOSIS — Z7189 Other specified counseling: Secondary | ICD-10-CM

## 2019-11-23 LAB — CMP (CANCER CENTER ONLY)
ALT: 14 U/L (ref 0–44)
AST: 20 U/L (ref 15–41)
Albumin: 3.7 g/dL (ref 3.5–5.0)
Alkaline Phosphatase: 66 U/L (ref 38–126)
Anion gap: 7 (ref 5–15)
BUN: 22 mg/dL (ref 8–23)
CO2: 24 mmol/L (ref 22–32)
Calcium: 8.9 mg/dL (ref 8.9–10.3)
Chloride: 107 mmol/L (ref 98–111)
Creatinine: 1.69 mg/dL — ABNORMAL HIGH (ref 0.61–1.24)
GFR, Est AFR Am: 46 mL/min — ABNORMAL LOW (ref >60)
GFR, Estimated: 39 mL/min — ABNORMAL LOW (ref >60)
Glucose, Bld: 98 mg/dL (ref 70–99)
Potassium: 3.6 mmol/L (ref 3.5–5.1)
Sodium: 138 mmol/L (ref 135–145)
Total Bilirubin: 0.4 mg/dL (ref 0.3–1.2)
Total Protein: 7.3 g/dL (ref 6.5–8.1)

## 2019-11-23 LAB — CBC WITH DIFFERENTIAL/PLATELET
Abs Immature Granulocytes: 0.04 10*3/uL (ref 0.00–0.07)
Basophils Absolute: 0 10*3/uL (ref 0.0–0.1)
Basophils Relative: 0 %
Eosinophils Absolute: 0 10*3/uL (ref 0.0–0.5)
Eosinophils Relative: 1 %
HCT: 32.8 % — ABNORMAL LOW (ref 39.0–52.0)
Hemoglobin: 11 g/dL — ABNORMAL LOW (ref 13.0–17.0)
Immature Granulocytes: 1 %
Lymphocytes Relative: 25 %
Lymphs Abs: 1.9 10*3/uL (ref 0.7–4.0)
MCH: 33.4 pg (ref 26.0–34.0)
MCHC: 33.5 g/dL (ref 30.0–36.0)
MCV: 99.7 fL (ref 80.0–100.0)
Monocytes Absolute: 1.1 10*3/uL — ABNORMAL HIGH (ref 0.1–1.0)
Monocytes Relative: 15 %
Neutro Abs: 4.4 10*3/uL (ref 1.7–7.7)
Neutrophils Relative %: 58 %
Platelets: 152 10*3/uL (ref 150–400)
RBC: 3.29 MIL/uL — ABNORMAL LOW (ref 4.22–5.81)
RDW: 14.3 % (ref 11.5–15.5)
WBC: 7.5 10*3/uL (ref 4.0–10.5)
nRBC: 0 % (ref 0.0–0.2)

## 2019-11-23 MED ORDER — SODIUM CHLORIDE 0.9% FLUSH
10.0000 mL | INTRAVENOUS | Status: DC | PRN
  Administered 2019-11-23: 15:00:00 10 mL
  Filled 2019-11-23: qty 10

## 2019-11-23 MED ORDER — SODIUM CHLORIDE 0.9 % IV SOLN
60.0000 mg | Freq: Once | INTRAVENOUS | Status: AC
  Administered 2019-11-23: 12:00:00 60 mg via INTRAVENOUS
  Filled 2019-11-23: qty 10

## 2019-11-23 MED ORDER — HEPARIN SOD (PORK) LOCK FLUSH 100 UNIT/ML IV SOLN
500.0000 [IU] | Freq: Once | INTRAVENOUS | Status: AC | PRN
  Administered 2019-11-23: 15:00:00 500 [IU]
  Filled 2019-11-23: qty 5

## 2019-11-23 MED ORDER — DEXTROSE 5 % IV SOLN
36.0000 mg/m2 | Freq: Once | INTRAVENOUS | Status: AC
  Administered 2019-11-23: 13:00:00 80 mg via INTRAVENOUS
  Filled 2019-11-23: qty 30

## 2019-11-23 MED ORDER — SODIUM CHLORIDE 0.9 % IV SOLN
Freq: Once | INTRAVENOUS | Status: AC
  Filled 2019-11-23: qty 250

## 2019-11-23 MED ORDER — SODIUM CHLORIDE 0.9 % IV SOLN
40.0000 mg | Freq: Once | INTRAVENOUS | Status: AC
  Administered 2019-11-23: 12:00:00 40 mg via INTRAVENOUS
  Filled 2019-11-23: qty 4

## 2019-11-23 MED ORDER — SODIUM CHLORIDE 0.9% FLUSH
10.0000 mL | Freq: Once | INTRAVENOUS | Status: AC | PRN
  Administered 2019-11-23: 10 mL
  Filled 2019-11-23: qty 10

## 2019-11-23 MED ORDER — DARBEPOETIN ALFA 200 MCG/0.4ML IJ SOSY
PREFILLED_SYRINGE | INTRAMUSCULAR | Status: AC
  Filled 2019-11-23: qty 0.4

## 2019-11-23 NOTE — Patient Instructions (Signed)

## 2019-11-23 NOTE — Progress Notes (Signed)
Per MD okay to treat with crt 1.69

## 2019-11-23 NOTE — Patient Instructions (Signed)
[facility] Discharge Instructions for Patients Receiving Chemotherapy  Today you received the following chemotherapy agents Kyprolis and aredia   To help prevent nausea and vomiting after your treatment, we encourage you to take your nausea medication as directed   If you develop nausea and vomiting that is not controlled by your nausea medication, call the clinic.   BELOW ARE SYMPTOMS THAT SHOULD BE REPORTED IMMEDIATELY:  *FEVER GREATER THAN 100.5 F  *CHILLS WITH OR WITHOUT FEVER  NAUSEA AND VOMITING THAT IS NOT CONTROLLED WITH YOUR NAUSEA MEDICATION  *UNUSUAL SHORTNESS OF BREATH  *UNUSUAL BRUISING OR BLEEDING  TENDERNESS IN MOUTH AND THROAT WITH OR WITHOUT PRESENCE OF ULCERS  *URINARY PROBLEMS  *BOWEL PROBLEMS  UNUSUAL RASH Items with * indicate a potential emergency and should be followed up as soon as possible.  Feel free to call the clinic should you have any questions or concerns. The clinic phone number is [phone].  Please show the [facility] at check-in to the Emergency Department and triage nurse.

## 2019-11-30 ENCOUNTER — Encounter: Payer: Self-pay | Admitting: Hematology

## 2019-11-30 NOTE — Progress Notes (Signed)
Pharmacist Chemotherapy Monitoring - Follow Up Assessment    I verify that I have reviewed each item in the below checklist:  . Regimen for the patient is scheduled for the appropriate day and plan matches scheduled date. Marland Kitchen Appropriate non-routine labs are ordered dependent on drug ordered. . If applicable, additional medications reviewed and ordered per protocol based on lifetime cumulative doses and/or treatment regimen.   Plan for follow-up and/or issues identified: No . I-vent associated with next due treatment: Yes . MD and/or nursing notified: No   Kennith Center, Pharm.D., CPP 11/30/2019@4 :15 PM

## 2019-12-03 ENCOUNTER — Encounter: Payer: Self-pay | Admitting: *Deleted

## 2019-12-04 ENCOUNTER — Other Ambulatory Visit: Payer: Self-pay | Admitting: Hematology

## 2019-12-04 DIAGNOSIS — Z7189 Other specified counseling: Secondary | ICD-10-CM

## 2019-12-04 DIAGNOSIS — C9000 Multiple myeloma not having achieved remission: Secondary | ICD-10-CM

## 2019-12-06 ENCOUNTER — Inpatient Hospital Stay: Payer: Medicare Other

## 2019-12-06 ENCOUNTER — Other Ambulatory Visit: Payer: Self-pay

## 2019-12-06 ENCOUNTER — Inpatient Hospital Stay: Payer: Medicare Other | Admitting: Hematology

## 2019-12-06 VITALS — BP 98/71 | HR 80 | Temp 98.0°F | Resp 18 | Ht 71.0 in | Wt 215.9 lb

## 2019-12-06 DIAGNOSIS — C9000 Multiple myeloma not having achieved remission: Secondary | ICD-10-CM

## 2019-12-06 DIAGNOSIS — Z7189 Other specified counseling: Secondary | ICD-10-CM

## 2019-12-06 DIAGNOSIS — Z79899 Other long term (current) drug therapy: Secondary | ICD-10-CM | POA: Diagnosis not present

## 2019-12-06 DIAGNOSIS — Z5112 Encounter for antineoplastic immunotherapy: Secondary | ICD-10-CM | POA: Diagnosis not present

## 2019-12-06 LAB — CMP (CANCER CENTER ONLY)
ALT: 16 U/L (ref 0–44)
AST: 22 U/L (ref 15–41)
Albumin: 3.7 g/dL (ref 3.5–5.0)
Alkaline Phosphatase: 72 U/L (ref 38–126)
Anion gap: 10 (ref 5–15)
BUN: 19 mg/dL (ref 8–23)
CO2: 25 mmol/L (ref 22–32)
Calcium: 9.5 mg/dL (ref 8.9–10.3)
Chloride: 103 mmol/L (ref 98–111)
Creatinine: 1.68 mg/dL — ABNORMAL HIGH (ref 0.61–1.24)
GFR, Est AFR Am: 46 mL/min — ABNORMAL LOW (ref >60)
GFR, Estimated: 40 mL/min — ABNORMAL LOW (ref >60)
Glucose, Bld: 107 mg/dL — ABNORMAL HIGH (ref 70–99)
Potassium: 4.4 mmol/L (ref 3.5–5.1)
Sodium: 138 mmol/L (ref 135–145)
Total Bilirubin: 0.6 mg/dL (ref 0.3–1.2)
Total Protein: 7.6 g/dL (ref 6.5–8.1)

## 2019-12-06 LAB — CBC WITH DIFFERENTIAL/PLATELET
Abs Immature Granulocytes: 0.01 10*3/uL (ref 0.00–0.07)
Basophils Absolute: 0.1 10*3/uL (ref 0.0–0.1)
Basophils Relative: 1 %
Eosinophils Absolute: 0.1 10*3/uL (ref 0.0–0.5)
Eosinophils Relative: 3 %
HCT: 37 % — ABNORMAL LOW (ref 39.0–52.0)
Hemoglobin: 12.3 g/dL — ABNORMAL LOW (ref 13.0–17.0)
Immature Granulocytes: 0 %
Lymphocytes Relative: 32 %
Lymphs Abs: 1.4 10*3/uL (ref 0.7–4.0)
MCH: 33.6 pg (ref 26.0–34.0)
MCHC: 33.2 g/dL (ref 30.0–36.0)
MCV: 101.1 fL — ABNORMAL HIGH (ref 80.0–100.0)
Monocytes Absolute: 0.6 10*3/uL (ref 0.1–1.0)
Monocytes Relative: 13 %
Neutro Abs: 2.3 10*3/uL (ref 1.7–7.7)
Neutrophils Relative %: 51 %
Platelets: 232 10*3/uL (ref 150–400)
RBC: 3.66 MIL/uL — ABNORMAL LOW (ref 4.22–5.81)
RDW: 14.4 % (ref 11.5–15.5)
WBC: 4.5 10*3/uL (ref 4.0–10.5)
nRBC: 0 % (ref 0.0–0.2)

## 2019-12-06 MED ORDER — DEXTROSE 5 % IV SOLN
54.5000 mg/m2 | Freq: Once | INTRAVENOUS | Status: AC
  Administered 2019-12-06: 120 mg via INTRAVENOUS
  Filled 2019-12-06: qty 60

## 2019-12-06 MED ORDER — SODIUM CHLORIDE 0.9 % IV SOLN
Freq: Once | INTRAVENOUS | Status: AC
  Filled 2019-12-06: qty 250

## 2019-12-06 MED ORDER — PROCHLORPERAZINE MALEATE 10 MG PO TABS
ORAL_TABLET | ORAL | Status: AC
  Filled 2019-12-06: qty 1

## 2019-12-06 MED ORDER — HEPARIN SOD (PORK) LOCK FLUSH 100 UNIT/ML IV SOLN
500.0000 [IU] | Freq: Once | INTRAVENOUS | Status: AC | PRN
  Administered 2019-12-06: 500 [IU]
  Filled 2019-12-06: qty 5

## 2019-12-06 MED ORDER — SODIUM CHLORIDE 0.9% FLUSH
10.0000 mL | INTRAVENOUS | Status: DC | PRN
  Administered 2019-12-06: 10 mL
  Filled 2019-12-06: qty 10

## 2019-12-06 MED ORDER — CEPHALEXIN 500 MG PO CAPS
500.0000 mg | ORAL_CAPSULE | Freq: Three times a day (TID) | ORAL | 0 refills | Status: DC
Start: 2019-12-06 — End: 2020-01-20

## 2019-12-06 MED ORDER — DEXAMETHASONE 4 MG PO TABS: ORAL_TABLET | ORAL | 4 refills | Status: DC

## 2019-12-06 MED ORDER — PROCHLORPERAZINE MALEATE 10 MG PO TABS
10.0000 mg | ORAL_TABLET | Freq: Once | ORAL | Status: AC
  Administered 2019-12-06: 10 mg via ORAL

## 2019-12-06 NOTE — Progress Notes (Signed)
Pt. took Decadron 20 mg po at 10:00 am this morning.

## 2019-12-06 NOTE — Telephone Encounter (Signed)
Refill requested
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
## 2019-12-06 NOTE — Patient Instructions (Signed)
[facility] Discharge Instructions for Patients Receiving Chemotherapy  Today you received the following chemotherapy agent: Carfilzomib (Kyprolis)  To help prevent nausea and vomiting after your treatment, we encourage you to take your nausea medication as directed by your MD.   If you develop nausea and vomiting that is not controlled by your nausea medication, call the clinic.   BELOW ARE SYMPTOMS THAT SHOULD BE REPORTED IMMEDIATELY:  *FEVER GREATER THAN 100.5 F  *CHILLS WITH OR WITHOUT FEVER  NAUSEA AND VOMITING THAT IS NOT CONTROLLED WITH YOUR NAUSEA MEDICATION  *UNUSUAL SHORTNESS OF BREATH  *UNUSUAL BRUISING OR BLEEDING  TENDERNESS IN MOUTH AND THROAT WITH OR WITHOUT PRESENCE OF ULCERS  *URINARY PROBLEMS  *BOWEL PROBLEMS  UNUSUAL RASH Items with * indicate a potential emergency and should be followed up as soon as possible.  Feel free to call the clinic should you have any questions or concerns. The clinic phone number is [phone].  Please show the [facility] at check-in to the Emergency Department and triage nurse.  Coronavirus (COVID-19) Are you at risk?  Are you at risk for the Coronavirus (COVID-19)?  To be considered HIGH RISK for Coronavirus (COVID-19), you have to meet the following criteria:  . Traveled to [place], [place], [place], [place] or [place]; or in the [place] to [place], [place], [place], or [place]; and have fever, cough, and shortness of breath within the last 2 weeks of travel OR . Been in close contact with a person diagnosed with COVID-19 within the last 2 weeks and have fever, cough, and shortness of breath . IF YOU DO NOT MEET THESE CRITERIA, YOU ARE CONSIDERED LOW RISK FOR COVID-19.  What to do if you are HIGH RISK for COVID-19?  Marland Kitchen If you are having a medical emergency, call 911. . Seek medical care right away. Before you go to a doctor's office, urgent care or emergency department, call ahead and  tell them about your recent travel, contact with someone diagnosed with COVID-19, and your symptoms. You should receive instructions from your physician's office regarding next steps of care.  . When you arrive at healthcare provider, tell the healthcare staff immediately you have returned from visiting [place], [place], [place], [place] or [place]; or traveled in the [place] to [place], [place], [place], or [place]; in the last two weeks or you have been in close contact with a person diagnosed with COVID-19 in the last 2 weeks.   . Tell the health care staff about your symptoms: fever, cough and shortness of breath. . After you have been seen by a medical provider, you will be either: o Tested for (COVID-19) and discharged home on quarantine except to seek medical care if symptoms worsen, and asked to  - Stay home and avoid contact with others until you get your results (4-5 days)  - Avoid travel on public transportation if possible (such as bus, train, or airplane) or o Sent to the Emergency Department by EMS for evaluation, COVID-19 testing, and possible admission depending on your condition and test results.  What to do if you are LOW RISK for COVID-19?  Reduce your risk of any infection by using the same precautions used for avoiding the common cold or flu:  Marland Kitchen Wash your hands often with soap and warm water for at least 20 seconds.  If soap and water are not readily available, use an alcohol-based hand sanitizer with at least 60% alcohol.  Marland Kitchen  If coughing or sneezing, cover your mouth and nose by coughing or sneezing into the elbow areas of your shirt or coat, into a tissue or into your sleeve (not your hands). . Avoid shaking hands with others and consider head nods or verbal greetings only. . Avoid touching your eyes, nose, or mouth with unwashed hands.  . Avoid close contact with people who are sick. . Avoid places or events with large numbers of people in one location, like  concerts or sporting events. . Carefully consider travel plans you have or are making. . If you are planning any travel outside or inside the [place], visit the CDC's Travelers' Health webpage for the latest health notices. . If you have some symptoms but not all symptoms, continue to monitor at home and seek medical attention if your symptoms worsen. . If you are having a medical emergency, call 911.   ADDITIONAL HEALTHCARE OPTIONS FOR PATIENTS  [facility] / e-Visit: [email or web address]         [facility]: [phone]  [facility]: [phone]                   [facility]: [phone]  

## 2019-12-08 NOTE — Progress Notes (Signed)
Pharmacist Chemotherapy Monitoring - Follow Up Assessment    I verify that I have reviewed each item in the below checklist:  . Regimen for the patient is scheduled for the appropriate day and plan matches scheduled date. Marland Kitchen Appropriate non-routine labs are ordered dependent on drug ordered. . If applicable, additional medications reviewed and ordered per protocol based on lifetime cumulative doses and/or treatment regimen.   Plan for follow-up and/or issues identified: No . I-vent associated with next due treatment: Yes . MD and/or nursing notified: No   Kennith Center, Pharm.D., CPP 12/08/2019@3 :49 PM

## 2019-12-09 ENCOUNTER — Encounter: Payer: Self-pay | Admitting: Hematology

## 2019-12-14 ENCOUNTER — Inpatient Hospital Stay: Payer: Medicare Other

## 2019-12-14 ENCOUNTER — Other Ambulatory Visit: Payer: Self-pay

## 2019-12-14 VITALS — BP 120/66 | HR 61 | Temp 98.1°F | Resp 18 | Wt 218.2 lb

## 2019-12-14 DIAGNOSIS — C9000 Multiple myeloma not having achieved remission: Secondary | ICD-10-CM

## 2019-12-14 DIAGNOSIS — Z5112 Encounter for antineoplastic immunotherapy: Secondary | ICD-10-CM | POA: Diagnosis not present

## 2019-12-14 DIAGNOSIS — C7951 Secondary malignant neoplasm of bone: Secondary | ICD-10-CM

## 2019-12-14 DIAGNOSIS — Z79899 Other long term (current) drug therapy: Secondary | ICD-10-CM | POA: Diagnosis not present

## 2019-12-14 DIAGNOSIS — Z7189 Other specified counseling: Secondary | ICD-10-CM

## 2019-12-14 LAB — CBC WITH DIFFERENTIAL/PLATELET
Abs Immature Granulocytes: 0.02 10*3/uL (ref 0.00–0.07)
Basophils Absolute: 0 10*3/uL (ref 0.0–0.1)
Basophils Relative: 0 %
Eosinophils Absolute: 0.2 10*3/uL (ref 0.0–0.5)
Eosinophils Relative: 2 %
HCT: 34.1 % — ABNORMAL LOW (ref 39.0–52.0)
Hemoglobin: 11.4 g/dL — ABNORMAL LOW (ref 13.0–17.0)
Immature Granulocytes: 0 %
Lymphocytes Relative: 26 %
Lymphs Abs: 1.7 10*3/uL (ref 0.7–4.0)
MCH: 33.7 pg (ref 26.0–34.0)
MCHC: 33.4 g/dL (ref 30.0–36.0)
MCV: 100.9 fL — ABNORMAL HIGH (ref 80.0–100.0)
Monocytes Absolute: 0.8 10*3/uL (ref 0.1–1.0)
Monocytes Relative: 13 %
Neutro Abs: 4 10*3/uL (ref 1.7–7.7)
Neutrophils Relative %: 59 %
Platelets: 122 10*3/uL — ABNORMAL LOW (ref 150–400)
RBC: 3.38 MIL/uL — ABNORMAL LOW (ref 4.22–5.81)
RDW: 14.8 % (ref 11.5–15.5)
WBC: 6.7 10*3/uL (ref 4.0–10.5)
nRBC: 0 % (ref 0.0–0.2)

## 2019-12-14 LAB — CMP (CANCER CENTER ONLY)
ALT: 15 U/L (ref 0–44)
AST: 17 U/L (ref 15–41)
Albumin: 3.7 g/dL (ref 3.5–5.0)
Alkaline Phosphatase: 68 U/L (ref 38–126)
Anion gap: 5 (ref 5–15)
BUN: 14 mg/dL (ref 8–23)
CO2: 28 mmol/L (ref 22–32)
Calcium: 9.2 mg/dL (ref 8.9–10.3)
Chloride: 107 mmol/L (ref 98–111)
Creatinine: 1.36 mg/dL — ABNORMAL HIGH (ref 0.61–1.24)
GFR, Est AFR Am: 59 mL/min — ABNORMAL LOW (ref >60)
GFR, Estimated: 51 mL/min — ABNORMAL LOW (ref >60)
Glucose, Bld: 79 mg/dL (ref 70–99)
Potassium: 3.9 mmol/L (ref 3.5–5.1)
Sodium: 140 mmol/L (ref 135–145)
Total Bilirubin: 0.5 mg/dL (ref 0.3–1.2)
Total Protein: 7.2 g/dL (ref 6.5–8.1)

## 2019-12-14 MED ORDER — SODIUM CHLORIDE 0.9 % IV SOLN
Freq: Once | INTRAVENOUS | Status: DC
  Filled 2019-12-14: qty 250

## 2019-12-14 MED ORDER — PROCHLORPERAZINE MALEATE 10 MG PO TABS
ORAL_TABLET | ORAL | Status: AC
  Filled 2019-12-14: qty 1

## 2019-12-14 MED ORDER — DEXTROSE 5 % IV SOLN
54.5000 mg/m2 | Freq: Once | INTRAVENOUS | Status: AC
  Administered 2019-12-14: 120 mg via INTRAVENOUS
  Filled 2019-12-14: qty 60

## 2019-12-14 MED ORDER — SODIUM CHLORIDE 0.9% FLUSH
10.0000 mL | INTRAVENOUS | Status: DC | PRN
  Administered 2019-12-14: 10 mL
  Filled 2019-12-14: qty 10

## 2019-12-14 MED ORDER — SODIUM CHLORIDE 0.9 % IV SOLN
Freq: Once | INTRAVENOUS | Status: AC
  Filled 2019-12-14: qty 250

## 2019-12-14 MED ORDER — PROCHLORPERAZINE MALEATE 10 MG PO TABS
10.0000 mg | ORAL_TABLET | Freq: Once | ORAL | Status: AC
  Administered 2019-12-14: 10 mg via ORAL

## 2019-12-14 MED ORDER — HEPARIN SOD (PORK) LOCK FLUSH 100 UNIT/ML IV SOLN
500.0000 [IU] | Freq: Once | INTRAVENOUS | Status: AC | PRN
  Administered 2019-12-14: 500 [IU]
  Filled 2019-12-14: qty 5

## 2019-12-14 MED ORDER — SODIUM CHLORIDE 0.9% FLUSH
10.0000 mL | Freq: Once | INTRAVENOUS | Status: DC | PRN
  Filled 2019-12-14: qty 10

## 2019-12-14 NOTE — Patient Instructions (Signed)
[facility] Discharge Instructions for Patients Receiving Chemotherapy  Today you received the following chemotherapy agents:  Kyprolis  To help prevent nausea and vomiting after your treatment, we encourage you to take your nausea medication as directed.   If you develop nausea and vomiting that is not controlled by your nausea medication, call the clinic.   BELOW ARE SYMPTOMS THAT SHOULD BE REPORTED IMMEDIATELY:  *FEVER GREATER THAN 100.5 F  *CHILLS WITH OR WITHOUT FEVER  NAUSEA AND VOMITING THAT IS NOT CONTROLLED WITH YOUR NAUSEA MEDICATION  *UNUSUAL SHORTNESS OF BREATH  *UNUSUAL BRUISING OR BLEEDING  TENDERNESS IN MOUTH AND THROAT WITH OR WITHOUT PRESENCE OF ULCERS  *URINARY PROBLEMS  *BOWEL PROBLEMS  UNUSUAL RASH Items with * indicate a potential emergency and should be followed up as soon as possible.  Feel free to call the clinic should you have any questions or concerns. The clinic phone number is [phone].  Please show the CHEMO ALERT CARD at check-in to the Emergency Department and triage nurse.   

## 2019-12-14 NOTE — Progress Notes (Signed)
Pharmacist Chemotherapy Monitoring - Follow Up Assessment    I verify that I have reviewed each item in the below checklist:   Regimen for the patient is scheduled for the appropriate day and plan matches scheduled date.  Appropriate non-routine labs are ordered dependent on drug ordered.  If applicable, additional medications reviewed and ordered per protocol based on lifetime cumulative doses and/or treatment regimen.   Plan for follow-up and/or issues identified: Yes  I-vent associated with next due treatment: Yes  MD and/or nursing notified: No   Kennith Center, Pharm.D., CPP 12/14/2019@8 :54 AM

## 2019-12-15 LAB — KAPPA/LAMBDA LIGHT CHAINS
Kappa free light chain: 397.3 mg/L — ABNORMAL HIGH (ref 3.3–19.4)
Kappa, lambda light chain ratio: 88.29 — ABNORMAL HIGH (ref 0.26–1.65)
Lambda free light chains: 4.5 mg/L — ABNORMAL LOW (ref 5.7–26.3)

## 2019-12-16 ENCOUNTER — Other Ambulatory Visit: Payer: Self-pay | Admitting: Hematology

## 2019-12-16 NOTE — Telephone Encounter (Signed)
Patient request refill

## 2019-12-17 LAB — MULTIPLE MYELOMA PANEL, SERUM
Albumin SerPl Elph-Mcnc: 3.6 g/dL (ref 2.9–4.4)
Albumin/Glob SerPl: 1.2 (ref 0.7–1.7)
Alpha 1: 0.3 g/dL (ref 0.0–0.4)
Alpha2 Glob SerPl Elph-Mcnc: 0.8 g/dL (ref 0.4–1.0)
B-Globulin SerPl Elph-Mcnc: 0.9 g/dL (ref 0.7–1.3)
Gamma Glob SerPl Elph-Mcnc: 1.2 g/dL (ref 0.4–1.8)
Globulin, Total: 3.2 g/dL (ref 2.2–3.9)
IgA: 8 mg/dL — ABNORMAL LOW (ref 61–437)
IgG (Immunoglobin G), Serum: 1423 mg/dL (ref 603–1613)
IgM (Immunoglobulin M), Srm: <5 mg/dL — ABNORMAL LOW (ref 15–143)
M Protein SerPl Elph-Mcnc: 1.1 g/dL — ABNORMAL HIGH
Total Protein ELP: 6.8 g/dL (ref 6.0–8.5)

## 2019-12-21 ENCOUNTER — Inpatient Hospital Stay: Payer: Medicare Other | Attending: Hematology

## 2019-12-21 ENCOUNTER — Inpatient Hospital Stay: Payer: Medicare Other

## 2019-12-21 ENCOUNTER — Other Ambulatory Visit: Payer: Self-pay

## 2019-12-21 VITALS — BP 122/81 | HR 71 | Temp 98.2°F | Resp 18

## 2019-12-21 DIAGNOSIS — C9000 Multiple myeloma not having achieved remission: Secondary | ICD-10-CM | POA: Diagnosis not present

## 2019-12-21 DIAGNOSIS — Z79899 Other long term (current) drug therapy: Secondary | ICD-10-CM | POA: Insufficient documentation

## 2019-12-21 DIAGNOSIS — Z7189 Other specified counseling: Secondary | ICD-10-CM

## 2019-12-21 DIAGNOSIS — Z5112 Encounter for antineoplastic immunotherapy: Secondary | ICD-10-CM | POA: Insufficient documentation

## 2019-12-21 LAB — CBC WITH DIFFERENTIAL/PLATELET
Abs Immature Granulocytes: 0.02 10*3/uL (ref 0.00–0.07)
Basophils Absolute: 0 10*3/uL (ref 0.0–0.1)
Basophils Relative: 0 %
Eosinophils Absolute: 0.2 10*3/uL (ref 0.0–0.5)
Eosinophils Relative: 2 %
HCT: 35.5 % — ABNORMAL LOW (ref 39.0–52.0)
Hemoglobin: 11.8 g/dL — ABNORMAL LOW (ref 13.0–17.0)
Immature Granulocytes: 0 %
Lymphocytes Relative: 25 %
Lymphs Abs: 1.7 10*3/uL (ref 0.7–4.0)
MCH: 33.3 pg (ref 26.0–34.0)
MCHC: 33.2 g/dL (ref 30.0–36.0)
MCV: 100.3 fL — ABNORMAL HIGH (ref 80.0–100.0)
Monocytes Absolute: 1.1 10*3/uL — ABNORMAL HIGH (ref 0.1–1.0)
Monocytes Relative: 16 %
Neutro Abs: 3.7 10*3/uL (ref 1.7–7.7)
Neutrophils Relative %: 57 %
Platelets: 186 10*3/uL (ref 150–400)
RBC: 3.54 MIL/uL — ABNORMAL LOW (ref 4.22–5.81)
RDW: 14.6 % (ref 11.5–15.5)
WBC: 6.7 10*3/uL (ref 4.0–10.5)
nRBC: 0 % (ref 0.0–0.2)

## 2019-12-21 LAB — CMP (CANCER CENTER ONLY)
ALT: 19 U/L (ref 0–44)
AST: 20 U/L (ref 15–41)
Albumin: 3.7 g/dL (ref 3.5–5.0)
Alkaline Phosphatase: 57 U/L (ref 38–126)
Anion gap: 11 (ref 5–15)
BUN: 20 mg/dL (ref 8–23)
CO2: 24 mmol/L (ref 22–32)
Calcium: 9.2 mg/dL (ref 8.9–10.3)
Chloride: 105 mmol/L (ref 98–111)
Creatinine: 1.53 mg/dL — ABNORMAL HIGH (ref 0.61–1.24)
GFR, Est AFR Am: 52 mL/min — ABNORMAL LOW (ref >60)
GFR, Estimated: 44 mL/min — ABNORMAL LOW (ref >60)
Glucose, Bld: 94 mg/dL (ref 70–99)
Potassium: 3.8 mmol/L (ref 3.5–5.1)
Sodium: 140 mmol/L (ref 135–145)
Total Bilirubin: 0.6 mg/dL (ref 0.3–1.2)
Total Protein: 7.1 g/dL (ref 6.5–8.1)

## 2019-12-21 MED ORDER — PROCHLORPERAZINE MALEATE 10 MG PO TABS
10.0000 mg | ORAL_TABLET | Freq: Once | ORAL | Status: AC
  Administered 2019-12-21: 10 mg via ORAL

## 2019-12-21 MED ORDER — SODIUM CHLORIDE 0.9 % IV SOLN
60.0000 mg | Freq: Once | INTRAVENOUS | Status: AC
  Administered 2019-12-21: 60 mg via INTRAVENOUS
  Filled 2019-12-21: qty 10
  Filled 2019-12-21: qty 20

## 2019-12-21 MED ORDER — PROCHLORPERAZINE MALEATE 10 MG PO TABS
ORAL_TABLET | ORAL | Status: AC
  Filled 2019-12-21: qty 1

## 2019-12-21 MED ORDER — SODIUM CHLORIDE 0.9% FLUSH
10.0000 mL | INTRAVENOUS | Status: DC | PRN
  Administered 2019-12-21: 10 mL
  Filled 2019-12-21: qty 10

## 2019-12-21 MED ORDER — SODIUM CHLORIDE 0.9% FLUSH
10.0000 mL | Freq: Once | INTRAVENOUS | Status: AC | PRN
  Administered 2019-12-21: 10 mL
  Filled 2019-12-21: qty 10

## 2019-12-21 MED ORDER — DEXTROSE 5 % IV SOLN
54.5000 mg/m2 | Freq: Once | INTRAVENOUS | Status: AC
  Administered 2019-12-21: 120 mg via INTRAVENOUS
  Filled 2019-12-21: qty 60

## 2019-12-21 MED ORDER — HEPARIN SOD (PORK) LOCK FLUSH 100 UNIT/ML IV SOLN
500.0000 [IU] | Freq: Once | INTRAVENOUS | Status: AC | PRN
  Administered 2019-12-21: 500 [IU]
  Filled 2019-12-21: qty 5

## 2019-12-21 MED ORDER — SODIUM CHLORIDE 0.9 % IV SOLN
Freq: Once | INTRAVENOUS | Status: AC
  Filled 2019-12-21: qty 250

## 2019-12-21 NOTE — Progress Notes (Signed)
Pt's creatnine today is 1.53, ok to treat per Dr. Irene Limbo

## 2019-12-21 NOTE — Patient Instructions (Signed)
[facility] Discharge Instructions for Patients Receiving Chemotherapy  Today you received the following chemotherapy agents Kyprolis and aredia   To help prevent nausea and vomiting after your treatment, we encourage you to take your nausea medication as directed   If you develop nausea and vomiting that is not controlled by your nausea medication, call the clinic.   BELOW ARE SYMPTOMS THAT SHOULD BE REPORTED IMMEDIATELY:  *FEVER GREATER THAN 100.5 F  *CHILLS WITH OR WITHOUT FEVER  NAUSEA AND VOMITING THAT IS NOT CONTROLLED WITH YOUR NAUSEA MEDICATION  *UNUSUAL SHORTNESS OF BREATH  *UNUSUAL BRUISING OR BLEEDING  TENDERNESS IN MOUTH AND THROAT WITH OR WITHOUT PRESENCE OF ULCERS  *URINARY PROBLEMS  *BOWEL PROBLEMS  UNUSUAL RASH Items with * indicate a potential emergency and should be followed up as soon as possible.  Feel free to call the clinic should you have any questions or concerns. The clinic phone number is [phone].  Please show the [facility] at check-in to the Emergency Department and triage nurse.

## 2019-12-24 ENCOUNTER — Other Ambulatory Visit: Payer: Self-pay

## 2019-12-24 DIAGNOSIS — C9000 Multiple myeloma not having achieved remission: Secondary | ICD-10-CM

## 2019-12-24 LAB — MULTIPLE MYELOMA PANEL, SERUM
Albumin SerPl Elph-Mcnc: 3.7 g/dL (ref 2.9–4.4)
Albumin/Glob SerPl: 1.3 (ref 0.7–1.7)
Alpha 1: 0.2 g/dL (ref 0.0–0.4)
Alpha2 Glob SerPl Elph-Mcnc: 0.8 g/dL (ref 0.4–1.0)
B-Globulin SerPl Elph-Mcnc: 0.8 g/dL (ref 0.7–1.3)
Gamma Glob SerPl Elph-Mcnc: 1 g/dL (ref 0.4–1.8)
Globulin, Total: 2.9 g/dL (ref 2.2–3.9)
IgA: 9 mg/dL — ABNORMAL LOW (ref 61–437)
IgG (Immunoglobin G), Serum: 1117 mg/dL (ref 603–1613)
IgM (Immunoglobulin M), Srm: 6 mg/dL — ABNORMAL LOW (ref 15–143)
M Protein SerPl Elph-Mcnc: 0.9 g/dL — ABNORMAL HIGH
Total Protein ELP: 6.6 g/dL (ref 6.0–8.5)

## 2019-12-24 MED ORDER — LENALIDOMIDE 15 MG PO CAPS: 15.0000 mg | ORAL_CAPSULE | Freq: Every day | ORAL | 0 refills | Status: DC

## 2019-12-28 ENCOUNTER — Other Ambulatory Visit: Payer: Self-pay | Admitting: Internal Medicine

## 2019-12-28 ENCOUNTER — Telehealth: Payer: Self-pay | Admitting: *Deleted

## 2019-12-28 NOTE — Telephone Encounter (Signed)
Patient called, wants to know if ok to have dental appt for cleaning at this time or if he should reschedule. Dr. Irene Limbo informed. Contacted patient with Dr. Grier Mitts response: okay to have dental cleaning appointment -- will need prophylactic antibiotics if he has a port a cath or artificial joints or heart valve issues.  Patient has port, he states he will notify dentist of same and need for antibiotics

## 2020-01-03 ENCOUNTER — Inpatient Hospital Stay: Payer: Medicare Other

## 2020-01-03 ENCOUNTER — Inpatient Hospital Stay (HOSPITAL_BASED_OUTPATIENT_CLINIC_OR_DEPARTMENT_OTHER): Payer: Medicare Other | Admitting: Hematology

## 2020-01-03 ENCOUNTER — Other Ambulatory Visit: Payer: Self-pay

## 2020-01-03 VITALS — BP 129/70 | HR 77 | Temp 98.1°F | Resp 17 | Ht 71.0 in | Wt 212.0 lb

## 2020-01-03 DIAGNOSIS — C9000 Multiple myeloma not having achieved remission: Secondary | ICD-10-CM

## 2020-01-03 DIAGNOSIS — Z7189 Other specified counseling: Secondary | ICD-10-CM

## 2020-01-03 DIAGNOSIS — Z5111 Encounter for antineoplastic chemotherapy: Secondary | ICD-10-CM | POA: Diagnosis not present

## 2020-01-03 DIAGNOSIS — Z79899 Other long term (current) drug therapy: Secondary | ICD-10-CM | POA: Diagnosis not present

## 2020-01-03 DIAGNOSIS — Z5112 Encounter for antineoplastic immunotherapy: Secondary | ICD-10-CM | POA: Diagnosis not present

## 2020-01-03 LAB — CBC WITH DIFFERENTIAL/PLATELET
Abs Immature Granulocytes: 0.02 10*3/uL (ref 0.00–0.07)
Basophils Absolute: 0.2 10*3/uL — ABNORMAL HIGH (ref 0.0–0.1)
Basophils Relative: 4 %
Eosinophils Absolute: 0 10*3/uL (ref 0.0–0.5)
Eosinophils Relative: 1 %
HCT: 35.4 % — ABNORMAL LOW (ref 39.0–52.0)
Hemoglobin: 11.8 g/dL — ABNORMAL LOW (ref 13.0–17.0)
Immature Granulocytes: 0 %
Lymphocytes Relative: 17 %
Lymphs Abs: 1 10*3/uL (ref 0.7–4.0)
MCH: 33 pg (ref 26.0–34.0)
MCHC: 33.3 g/dL (ref 30.0–36.0)
MCV: 98.9 fL (ref 80.0–100.0)
Monocytes Absolute: 0.7 10*3/uL (ref 0.1–1.0)
Monocytes Relative: 11 %
Neutro Abs: 4.1 10*3/uL (ref 1.7–7.7)
Neutrophils Relative %: 67 %
Platelets: 276 10*3/uL (ref 150–400)
RBC: 3.58 MIL/uL — ABNORMAL LOW (ref 4.22–5.81)
RDW: 14.5 % (ref 11.5–15.5)
WBC: 6.1 10*3/uL (ref 4.0–10.5)
nRBC: 0 % (ref 0.0–0.2)

## 2020-01-03 LAB — CMP (CANCER CENTER ONLY)
ALT: 15 U/L (ref 0–44)
AST: 22 U/L (ref 15–41)
Albumin: 3.6 g/dL (ref 3.5–5.0)
Alkaline Phosphatase: 60 U/L (ref 38–126)
Anion gap: 10 (ref 5–15)
BUN: 19 mg/dL (ref 8–23)
CO2: 24 mmol/L (ref 22–32)
Calcium: 9.7 mg/dL (ref 8.9–10.3)
Chloride: 103 mmol/L (ref 98–111)
Creatinine: 1.58 mg/dL — ABNORMAL HIGH (ref 0.61–1.24)
GFR, Est AFR Am: 50 mL/min — ABNORMAL LOW (ref >60)
GFR, Estimated: 43 mL/min — ABNORMAL LOW (ref >60)
Glucose, Bld: 102 mg/dL — ABNORMAL HIGH (ref 70–99)
Potassium: 4.4 mmol/L (ref 3.5–5.1)
Sodium: 137 mmol/L (ref 135–145)
Total Bilirubin: 0.7 mg/dL (ref 0.3–1.2)
Total Protein: 7 g/dL (ref 6.5–8.1)

## 2020-01-03 MED ORDER — HEPARIN SOD (PORK) LOCK FLUSH 100 UNIT/ML IV SOLN
500.0000 [IU] | Freq: Once | INTRAVENOUS | Status: AC | PRN
  Administered 2020-01-03: 500 [IU]
  Filled 2020-01-03: qty 5

## 2020-01-03 MED ORDER — TRAMADOL HCL 50 MG PO TABS: ORAL_TABLET | ORAL | 0 refills | Status: DC

## 2020-01-03 MED ORDER — SODIUM CHLORIDE 0.9 % IV SOLN
Freq: Once | INTRAVENOUS | Status: AC
  Filled 2020-01-03: qty 250

## 2020-01-03 MED ORDER — SODIUM CHLORIDE 0.9 % IV SOLN
Freq: Once | INTRAVENOUS | Status: DC
  Filled 2020-01-03: qty 250

## 2020-01-03 MED ORDER — PROCHLORPERAZINE MALEATE 10 MG PO TABS
ORAL_TABLET | ORAL | Status: AC
  Filled 2020-01-03: qty 1

## 2020-01-03 MED ORDER — DEXTROSE 5 % IV SOLN
56.0000 mg/m2 | Freq: Once | INTRAVENOUS | Status: AC
  Administered 2020-01-03: 120 mg via INTRAVENOUS
  Filled 2020-01-03: qty 60

## 2020-01-03 MED ORDER — PROCHLORPERAZINE MALEATE 10 MG PO TABS
10.0000 mg | ORAL_TABLET | Freq: Once | ORAL | Status: AC
  Administered 2020-01-03: 10 mg via ORAL

## 2020-01-03 MED ORDER — SODIUM CHLORIDE 0.9% FLUSH
10.0000 mL | INTRAVENOUS | Status: DC | PRN
  Administered 2020-01-03: 10 mL
  Filled 2020-01-03: qty 10

## 2020-01-03 MED ORDER — ACETAMINOPHEN 325 MG PO TABS
650.0000 mg | ORAL_TABLET | Freq: Once | ORAL | Status: AC
  Administered 2020-01-03: 650 mg via ORAL

## 2020-01-03 MED ORDER — ACETAMINOPHEN 325 MG PO TABS
ORAL_TABLET | ORAL | Status: AC
  Filled 2020-01-03: qty 2

## 2020-01-03 MED ORDER — SODIUM CHLORIDE 0.9% FLUSH
10.0000 mL | Freq: Once | INTRAVENOUS | Status: AC | PRN
  Administered 2020-01-03: 10 mL
  Filled 2020-01-03: qty 10
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
## 2020-01-03 NOTE — Patient Instructions (Signed)
[facility] Discharge Instructions for Patients Receiving Chemotherapy  Today you received the following chemotherapy agents:  Kyprolis  To help prevent nausea and vomiting after your treatment, we encourage you to take your nausea medication as directed.   If you develop nausea and vomiting that is not controlled by your nausea medication, call the clinic.   BELOW ARE SYMPTOMS THAT SHOULD BE REPORTED IMMEDIATELY:  *FEVER GREATER THAN 100.5 F  *CHILLS WITH OR WITHOUT FEVER  NAUSEA AND VOMITING THAT IS NOT CONTROLLED WITH YOUR NAUSEA MEDICATION  *UNUSUAL SHORTNESS OF BREATH  *UNUSUAL BRUISING OR BLEEDING  TENDERNESS IN MOUTH AND THROAT WITH OR WITHOUT PRESENCE OF ULCERS  *URINARY PROBLEMS  *BOWEL PROBLEMS  UNUSUAL RASH Items with * indicate a potential emergency and should be followed up as soon as possible.  Feel free to call the clinic should you have any questions or concerns. The clinic phone number is [phone].  Please show the CHEMO ALERT CARD at check-in to the Emergency Department and triage nurse.   

## 2020-01-03 NOTE — Patient Instructions (Signed)

## 2020-01-03 NOTE — Progress Notes (Signed)
Per Dr. Irene Limbo, okay to treat with SCr 1.58.

## 2020-01-07 ENCOUNTER — Other Ambulatory Visit: Payer: Self-pay | Admitting: Hematology

## 2020-01-07 DIAGNOSIS — C9000 Multiple myeloma not having achieved remission: Secondary | ICD-10-CM

## 2020-01-07 DIAGNOSIS — Z7189 Other specified counseling: Secondary | ICD-10-CM

## 2020-01-07 NOTE — Telephone Encounter (Signed)
Please review request for refill 

## 2020-01-11 ENCOUNTER — Inpatient Hospital Stay: Payer: Medicare Other

## 2020-01-11 ENCOUNTER — Other Ambulatory Visit: Payer: Self-pay

## 2020-01-11 VITALS — BP 124/85 | HR 97 | Temp 98.7°F | Resp 18 | Wt 213.2 lb

## 2020-01-11 DIAGNOSIS — C9000 Multiple myeloma not having achieved remission: Secondary | ICD-10-CM

## 2020-01-11 DIAGNOSIS — Z7189 Other specified counseling: Secondary | ICD-10-CM

## 2020-01-11 DIAGNOSIS — Z95828 Presence of other vascular implants and grafts: Secondary | ICD-10-CM

## 2020-01-11 DIAGNOSIS — Z79899 Other long term (current) drug therapy: Secondary | ICD-10-CM | POA: Diagnosis not present

## 2020-01-11 DIAGNOSIS — Z5112 Encounter for antineoplastic immunotherapy: Secondary | ICD-10-CM | POA: Diagnosis not present

## 2020-01-11 DIAGNOSIS — Z5111 Encounter for antineoplastic chemotherapy: Secondary | ICD-10-CM

## 2020-01-11 LAB — CBC WITH DIFFERENTIAL/PLATELET
Abs Immature Granulocytes: 0.06 10*3/uL (ref 0.00–0.07)
Basophils Absolute: 0.1 10*3/uL (ref 0.0–0.1)
Basophils Relative: 0 %
Eosinophils Absolute: 0.2 10*3/uL (ref 0.0–0.5)
Eosinophils Relative: 2 %
HCT: 35.2 % — ABNORMAL LOW (ref 39.0–52.0)
Hemoglobin: 11.7 g/dL — ABNORMAL LOW (ref 13.0–17.0)
Immature Granulocytes: 1 %
Lymphocytes Relative: 21 %
Lymphs Abs: 2.4 10*3/uL (ref 0.7–4.0)
MCH: 33.2 pg (ref 26.0–34.0)
MCHC: 33.2 g/dL (ref 30.0–36.0)
MCV: 100 fL (ref 80.0–100.0)
Monocytes Absolute: 0.9 10*3/uL (ref 0.1–1.0)
Monocytes Relative: 8 %
Neutro Abs: 7.6 10*3/uL (ref 1.7–7.7)
Neutrophils Relative %: 68 %
Platelets: 148 10*3/uL — ABNORMAL LOW (ref 150–400)
RBC: 3.52 MIL/uL — ABNORMAL LOW (ref 4.22–5.81)
RDW: 14.7 % (ref 11.5–15.5)
WBC: 11.2 10*3/uL — ABNORMAL HIGH (ref 4.0–10.5)
nRBC: 0 % (ref 0.0–0.2)

## 2020-01-11 LAB — CMP (CANCER CENTER ONLY)
ALT: 15 U/L (ref 0–44)
AST: 15 U/L (ref 15–41)
Albumin: 3.6 g/dL (ref 3.5–5.0)
Alkaline Phosphatase: 70 U/L (ref 38–126)
Anion gap: 10 (ref 5–15)
BUN: 19 mg/dL (ref 8–23)
CO2: 23 mmol/L (ref 22–32)
Calcium: 9.1 mg/dL (ref 8.9–10.3)
Chloride: 104 mmol/L (ref 98–111)
Creatinine: 1.43 mg/dL — ABNORMAL HIGH (ref 0.61–1.24)
GFR, Est AFR Am: 56 mL/min — ABNORMAL LOW (ref >60)
GFR, Estimated: 48 mL/min — ABNORMAL LOW (ref >60)
Glucose, Bld: 94 mg/dL (ref 70–99)
Potassium: 4.1 mmol/L (ref 3.5–5.1)
Sodium: 137 mmol/L (ref 135–145)
Total Bilirubin: 0.6 mg/dL (ref 0.3–1.2)
Total Protein: 6.6 g/dL (ref 6.5–8.1)

## 2020-01-11 MED ORDER — ACETAMINOPHEN 325 MG PO TABS
650.0000 mg | ORAL_TABLET | Freq: Once | ORAL | Status: AC
  Administered 2020-01-11: 650 mg via ORAL

## 2020-01-11 MED ORDER — SODIUM CHLORIDE 0.9% FLUSH
10.0000 mL | INTRAVENOUS | Status: DC | PRN
  Administered 2020-01-11: 10 mL via INTRAVENOUS
  Filled 2020-01-11: qty 10

## 2020-01-11 MED ORDER — SODIUM CHLORIDE 0.9% FLUSH
10.0000 mL | INTRAVENOUS | Status: DC | PRN
  Administered 2020-01-11: 10 mL
  Filled 2020-01-11: qty 10

## 2020-01-11 MED ORDER — SODIUM CHLORIDE 0.9 % IV SOLN
Freq: Once | INTRAVENOUS | Status: AC
  Filled 2020-01-11: qty 250

## 2020-01-11 MED ORDER — ACETAMINOPHEN 325 MG PO TABS
ORAL_TABLET | ORAL | Status: AC
  Filled 2020-01-11: qty 2

## 2020-01-11 MED ORDER — HEPARIN SOD (PORK) LOCK FLUSH 100 UNIT/ML IV SOLN
500.0000 [IU] | Freq: Once | INTRAVENOUS | Status: AC | PRN
  Administered 2020-01-11: 500 [IU]
  Filled 2020-01-11: qty 5

## 2020-01-11 MED ORDER — PROCHLORPERAZINE MALEATE 10 MG PO TABS
ORAL_TABLET | ORAL | Status: AC
  Filled 2020-01-11: qty 1

## 2020-01-11 MED ORDER — DEXTROSE 5 % IV SOLN
56.0000 mg/m2 | Freq: Once | INTRAVENOUS | Status: AC
  Administered 2020-01-11: 120 mg via INTRAVENOUS
  Filled 2020-01-11: qty 60

## 2020-01-11 MED ORDER — PROCHLORPERAZINE MALEATE 10 MG PO TABS
10.0000 mg | ORAL_TABLET | Freq: Once | ORAL | Status: AC
  Administered 2020-01-11: 10 mg via ORAL

## 2020-01-11 NOTE — Patient Instructions (Signed)
[facility] Discharge Instructions for Patients Receiving Chemotherapy  Today you received the following chemotherapy agents:  Kyprolis  To help prevent nausea and vomiting after your treatment, we encourage you to take your nausea medication as directed.   If you develop nausea and vomiting that is not controlled by your nausea medication, call the clinic.   BELOW ARE SYMPTOMS THAT SHOULD BE REPORTED IMMEDIATELY:  *FEVER GREATER THAN 100.5 F  *CHILLS WITH OR WITHOUT FEVER  NAUSEA AND VOMITING THAT IS NOT CONTROLLED WITH YOUR NAUSEA MEDICATION  *UNUSUAL SHORTNESS OF BREATH  *UNUSUAL BRUISING OR BLEEDING  TENDERNESS IN MOUTH AND THROAT WITH OR WITHOUT PRESENCE OF ULCERS  *URINARY PROBLEMS  *BOWEL PROBLEMS  UNUSUAL RASH Items with * indicate a potential emergency and should be followed up as soon as possible.  Feel free to call the clinic should you have any questions or concerns. The clinic phone number is [phone].  Please show the CHEMO ALERT CARD at check-in to the Emergency Department and triage nurse.   

## 2020-01-12 ENCOUNTER — Telehealth: Payer: Self-pay | Admitting: *Deleted

## 2020-01-12 NOTE — Telephone Encounter (Signed)
Per Dr.Kale - contacted [facility] hematology to refer to Dr Norma Fredrickson for consideration of BMT for multiple myeloma.  Patient contact information given to Amy in [facility]. Per Amy, Dr. Norma Fredrickson no longer w/[facility] - patient will be referred to another MD following review and acceptance. They will contact patient.  Patient contacted with this information

## 2020-01-12 NOTE — Telephone Encounter (Signed)
Contacted patient with following info per Dr. Irene Limbo: fatigue is treatment related and expected.  Patient should continue to stay hydrated, eat healthy and get exercise (walking) as tolerated. Patient verbalized understanding.

## 2020-01-12 NOTE — Telephone Encounter (Signed)
Patient's wife called. She stated that patient's  HCT was low and states his energy is very low. She wants to know if there is anything that can be done to increase his energy. Contacted patient - he states he is very fatigued. Dr. Irene Limbo informed

## 2020-01-13 NOTE — Telephone Encounter (Signed)
Received call to fax recent office visit notes and labs to Amy in [facility]. Documents faxed to [phone].

## 2020-01-17 ENCOUNTER — Inpatient Hospital Stay: Payer: Medicare Other

## 2020-01-17 ENCOUNTER — Other Ambulatory Visit: Payer: Self-pay

## 2020-01-17 VITALS — BP 130/78 | HR 78 | Temp 98.5°F | Resp 18 | Wt 210.5 lb

## 2020-01-17 DIAGNOSIS — Z5112 Encounter for antineoplastic immunotherapy: Secondary | ICD-10-CM | POA: Diagnosis not present

## 2020-01-17 DIAGNOSIS — Z95828 Presence of other vascular implants and grafts: Secondary | ICD-10-CM

## 2020-01-17 DIAGNOSIS — Z7189 Other specified counseling: Secondary | ICD-10-CM

## 2020-01-17 DIAGNOSIS — C9000 Multiple myeloma not having achieved remission: Secondary | ICD-10-CM

## 2020-01-17 DIAGNOSIS — Z5111 Encounter for antineoplastic chemotherapy: Secondary | ICD-10-CM

## 2020-01-17 DIAGNOSIS — Z79899 Other long term (current) drug therapy: Secondary | ICD-10-CM | POA: Diagnosis not present

## 2020-01-17 LAB — CMP (CANCER CENTER ONLY)
ALT: 22 U/L (ref 0–44)
AST: 20 U/L (ref 15–41)
Albumin: 3.4 g/dL — ABNORMAL LOW (ref 3.5–5.0)
Alkaline Phosphatase: 63 U/L (ref 38–126)
Anion gap: 9 (ref 5–15)
BUN: 15 mg/dL (ref 8–23)
CO2: 23 mmol/L (ref 22–32)
Calcium: 8.6 mg/dL — ABNORMAL LOW (ref 8.9–10.3)
Chloride: 102 mmol/L (ref 98–111)
Creatinine: 1.39 mg/dL — ABNORMAL HIGH (ref 0.61–1.24)
GFR, Est AFR Am: 58 mL/min — ABNORMAL LOW (ref >60)
GFR, Estimated: 50 mL/min — ABNORMAL LOW (ref >60)
Glucose, Bld: 92 mg/dL (ref 70–99)
Potassium: 4.6 mmol/L (ref 3.5–5.1)
Sodium: 134 mmol/L — ABNORMAL LOW (ref 135–145)
Total Bilirubin: 0.8 mg/dL (ref 0.3–1.2)
Total Protein: 6.7 g/dL (ref 6.5–8.1)

## 2020-01-17 LAB — CBC WITH DIFFERENTIAL/PLATELET
Abs Immature Granulocytes: 0.04 10*3/uL (ref 0.00–0.07)
Basophils Absolute: 0 10*3/uL (ref 0.0–0.1)
Basophils Relative: 1 %
Eosinophils Absolute: 0.2 10*3/uL (ref 0.0–0.5)
Eosinophils Relative: 3 %
HCT: 34.7 % — ABNORMAL LOW (ref 39.0–52.0)
Hemoglobin: 11.4 g/dL — ABNORMAL LOW (ref 13.0–17.0)
Immature Granulocytes: 1 %
Lymphocytes Relative: 17 %
Lymphs Abs: 1.2 10*3/uL (ref 0.7–4.0)
MCH: 32.9 pg (ref 26.0–34.0)
MCHC: 32.9 g/dL (ref 30.0–36.0)
MCV: 100 fL (ref 80.0–100.0)
Monocytes Absolute: 0.5 10*3/uL (ref 0.1–1.0)
Monocytes Relative: 7 %
Neutro Abs: 5.2 10*3/uL (ref 1.7–7.7)
Neutrophils Relative %: 71 %
Platelets: 115 10*3/uL — ABNORMAL LOW (ref 150–400)
RBC: 3.47 MIL/uL — ABNORMAL LOW (ref 4.22–5.81)
RDW: 14.6 % (ref 11.5–15.5)
WBC: 7.1 10*3/uL (ref 4.0–10.5)
nRBC: 0 % (ref 0.0–0.2)

## 2020-01-17 MED ORDER — PROCHLORPERAZINE MALEATE 10 MG PO TABS
10.0000 mg | ORAL_TABLET | Freq: Once | ORAL | Status: AC
  Administered 2020-01-17: 10 mg via ORAL

## 2020-01-17 MED ORDER — SODIUM CHLORIDE 0.9 % IV SOLN
60.0000 mg | Freq: Once | INTRAVENOUS | Status: AC
  Administered 2020-01-17: 60 mg via INTRAVENOUS
  Filled 2020-01-17: qty 10

## 2020-01-17 MED ORDER — ACETAMINOPHEN 325 MG PO TABS
ORAL_TABLET | ORAL | Status: AC
  Filled 2020-01-17: qty 2

## 2020-01-17 MED ORDER — SODIUM CHLORIDE 0.9 % IV SOLN
Freq: Once | INTRAVENOUS | Status: AC
  Filled 2020-01-17: qty 250

## 2020-01-17 MED ORDER — SODIUM CHLORIDE 0.9% FLUSH
10.0000 mL | INTRAVENOUS | Status: DC | PRN
  Administered 2020-01-17: 10 mL
  Filled 2020-01-17: qty 10

## 2020-01-17 MED ORDER — PROCHLORPERAZINE MALEATE 10 MG PO TABS
ORAL_TABLET | ORAL | Status: AC
  Filled 2020-01-17: qty 1

## 2020-01-17 MED ORDER — SODIUM CHLORIDE 0.9% FLUSH
10.0000 mL | INTRAVENOUS | Status: DC | PRN
  Administered 2020-01-17: 10 mL via INTRAVENOUS
  Filled 2020-01-17: qty 10

## 2020-01-17 MED ORDER — HEPARIN SOD (PORK) LOCK FLUSH 100 UNIT/ML IV SOLN
500.0000 [IU] | Freq: Once | INTRAVENOUS | Status: AC | PRN
  Administered 2020-01-17: 500 [IU]
  Filled 2020-01-17: qty 5

## 2020-01-17 MED ORDER — ACETAMINOPHEN 325 MG PO TABS
650.0000 mg | ORAL_TABLET | Freq: Once | ORAL | Status: AC
  Administered 2020-01-17: 650 mg via ORAL

## 2020-01-17 MED ORDER — DEXTROSE 5 % IV SOLN
56.0000 mg/m2 | Freq: Once | INTRAVENOUS | Status: AC
  Administered 2020-01-17: 120 mg via INTRAVENOUS
  Filled 2020-01-17: qty 60

## 2020-01-17 NOTE — Patient Instructions (Signed)
[facility] Discharge Instructions for Patients Receiving Chemotherapy  Today you received the following chemotherapy agents Kyprolis and Aredia   To help prevent nausea and vomiting after your treatment, we encourage you to take your nausea medication as directed   If you develop nausea and vomiting that is not controlled by your nausea medication, call the clinic.   BELOW ARE SYMPTOMS THAT SHOULD BE REPORTED IMMEDIATELY:  *FEVER GREATER THAN 100.5 F  *CHILLS WITH OR WITHOUT FEVER  NAUSEA AND VOMITING THAT IS NOT CONTROLLED WITH YOUR NAUSEA MEDICATION  *UNUSUAL SHORTNESS OF BREATH  *UNUSUAL BRUISING OR BLEEDING  TENDERNESS IN MOUTH AND THROAT WITH OR WITHOUT PRESENCE OF ULCERS  *URINARY PROBLEMS  *BOWEL PROBLEMS  UNUSUAL RASH Items with * indicate a potential emergency and should be followed up as soon as possible.  Feel free to call the clinic should you have any questions or concerns. The clinic phone number is [phone].  Please show the [place] at check-in to the Emergency Department and triage nurse.

## 2020-01-18 LAB — MULTIPLE MYELOMA PANEL, SERUM
Albumin SerPl Elph-Mcnc: 3.2 g/dL (ref 2.9–4.4)
Albumin/Glob SerPl: 1.2 (ref 0.7–1.7)
Alpha 1: 0.4 g/dL (ref 0.0–0.4)
Alpha2 Glob SerPl Elph-Mcnc: 1 g/dL (ref 0.4–1.0)
B-Globulin SerPl Elph-Mcnc: 0.9 g/dL (ref 0.7–1.3)
Gamma Glob SerPl Elph-Mcnc: 0.7 g/dL (ref 0.4–1.8)
Globulin, Total: 2.9 g/dL (ref 2.2–3.9)
IgA: 27 mg/dL — ABNORMAL LOW (ref 61–437)
IgG (Immunoglobin G), Serum: 769 mg/dL (ref 603–1613)
IgM (Immunoglobulin M), Srm: 8 mg/dL — ABNORMAL LOW (ref 15–143)
M Protein SerPl Elph-Mcnc: 0.4 g/dL — ABNORMAL HIGH
Total Protein ELP: 6.1 g/dL (ref 6.0–8.5)

## 2020-01-18 LAB — KAPPA/LAMBDA LIGHT CHAINS
Kappa free light chain: 156.7 mg/L — ABNORMAL HIGH (ref 3.3–19.4)
Kappa, lambda light chain ratio: 11.19 — ABNORMAL HIGH (ref 0.26–1.65)
Lambda free light chains: 14 mg/L (ref 5.7–26.3)

## 2020-01-19 ENCOUNTER — Other Ambulatory Visit: Payer: Self-pay | Admitting: Hematology

## 2020-01-19 ENCOUNTER — Telehealth: Payer: Self-pay | Admitting: *Deleted

## 2020-01-19 DIAGNOSIS — Z7189 Other specified counseling: Secondary | ICD-10-CM

## 2020-01-19 DIAGNOSIS — C9000 Multiple myeloma not having achieved remission: Secondary | ICD-10-CM

## 2020-01-19 DIAGNOSIS — C9030 Solitary plasmacytoma not having achieved remission: Secondary | ICD-10-CM | POA: Diagnosis not present

## 2020-01-19 MED ORDER — LORAZEPAM 0.5 MG PO TABS: ORAL_TABLET | ORAL | 0 refills | Status: DC

## 2020-01-19 NOTE — Telephone Encounter (Signed)
Patient called - He shook a lorazepam out into his hand over sink. Bottle got away from him, he poured most of a full prescription into the sink. Has about 4 pills left. States he has other nausea medications, but that the lorazepam was helping with night time anxiety so he could sleep. He asked if a refill was possible? Prescription for 30 tablets written on 6/18. Dr. Irene Limbo informed.  Dr. Irene Limbo sent new prescription to pharmacy. Patient informed ad verbalized understanding.

## 2020-01-20 ENCOUNTER — Other Ambulatory Visit: Payer: Self-pay

## 2020-01-20 ENCOUNTER — Emergency Department (HOSPITAL_COMMUNITY)
Admission: EM | Admit: 2020-01-20 | Discharge: 2020-01-20 | Disposition: A | Discharge status: Home / Self Care | Payer: Medicare Other | Attending: Emergency Medicine | Admitting: Emergency Medicine

## 2020-01-20 ENCOUNTER — Telehealth: Payer: Self-pay | Admitting: *Deleted

## 2020-01-20 ENCOUNTER — Encounter (HOSPITAL_COMMUNITY): Payer: Self-pay

## 2020-01-20 ENCOUNTER — Emergency Department (HOSPITAL_BASED_OUTPATIENT_CLINIC_OR_DEPARTMENT_OTHER): Payer: Medicare Other

## 2020-01-20 DIAGNOSIS — I129 Hypertensive chronic kidney disease with stage 1 through stage 4 chronic kidney disease, or unspecified chronic kidney disease: Secondary | ICD-10-CM | POA: Insufficient documentation

## 2020-01-20 DIAGNOSIS — R609 Edema, unspecified: Secondary | ICD-10-CM | POA: Diagnosis not present

## 2020-01-20 DIAGNOSIS — I82492 Acute embolism and thrombosis of other specified deep vein of left lower extremity: Secondary | ICD-10-CM

## 2020-01-20 DIAGNOSIS — C9000 Multiple myeloma not having achieved remission: Secondary | ICD-10-CM | POA: Insufficient documentation

## 2020-01-20 DIAGNOSIS — N189 Chronic kidney disease, unspecified: Secondary | ICD-10-CM | POA: Insufficient documentation

## 2020-01-20 DIAGNOSIS — Z79899 Other long term (current) drug therapy: Secondary | ICD-10-CM | POA: Insufficient documentation

## 2020-01-20 DIAGNOSIS — Z96691 Finger-joint replacement of right hand: Secondary | ICD-10-CM | POA: Diagnosis not present

## 2020-01-20 DIAGNOSIS — Z7901 Long term (current) use of anticoagulants: Secondary | ICD-10-CM | POA: Diagnosis not present

## 2020-01-20 DIAGNOSIS — I82402 Acute embolism and thrombosis of unspecified deep veins of left lower extremity: Secondary | ICD-10-CM | POA: Diagnosis not present

## 2020-01-20 DIAGNOSIS — R2242 Localized swelling, mass and lump, left lower limb: Secondary | ICD-10-CM | POA: Diagnosis present

## 2020-01-20 LAB — CBC WITH DIFFERENTIAL/PLATELET
Abs Immature Granulocytes: 0.06 10*3/uL (ref 0.00–0.07)
Basophils Absolute: 0 10*3/uL (ref 0.0–0.1)
Basophils Relative: 1 %
Eosinophils Absolute: 0.4 10*3/uL (ref 0.0–0.5)
Eosinophils Relative: 8 %
HCT: 32.5 % — ABNORMAL LOW (ref 39.0–52.0)
Hemoglobin: 10.6 g/dL — ABNORMAL LOW (ref 13.0–17.0)
Immature Granulocytes: 1 %
Lymphocytes Relative: 29 %
Lymphs Abs: 1.5 10*3/uL (ref 0.7–4.0)
MCH: 32.8 pg (ref 26.0–34.0)
MCHC: 32.6 g/dL (ref 30.0–36.0)
MCV: 100.6 fL — ABNORMAL HIGH (ref 80.0–100.0)
Monocytes Absolute: 0.7 10*3/uL (ref 0.1–1.0)
Monocytes Relative: 13 %
Neutro Abs: 2.4 10*3/uL (ref 1.7–7.7)
Neutrophils Relative %: 48 %
Platelets: 77 10*3/uL — ABNORMAL LOW (ref 150–400)
RBC: 3.23 MIL/uL — ABNORMAL LOW (ref 4.22–5.81)
RDW: 14.6 % (ref 11.5–15.5)
WBC: 5 10*3/uL (ref 4.0–10.5)
nRBC: 0 % (ref 0.0–0.2)

## 2020-01-20 LAB — BASIC METABOLIC PANEL
Anion gap: 11 (ref 5–15)
BUN: 17 mg/dL (ref 8–23)
CO2: 24 mmol/L (ref 22–32)
Calcium: 8.3 mg/dL — ABNORMAL LOW (ref 8.9–10.3)
Chloride: 101 mmol/L (ref 98–111)
Creatinine, Ser: 1.46 mg/dL — ABNORMAL HIGH (ref 0.61–1.24)
GFR calc Af Amer: 55 mL/min — ABNORMAL LOW (ref >60)
GFR calc non Af Amer: 47 mL/min — ABNORMAL LOW (ref >60)
Glucose, Bld: 110 mg/dL — ABNORMAL HIGH (ref 70–99)
Potassium: 4.2 mmol/L (ref 3.5–5.1)
Sodium: 136 mmol/L (ref 135–145)

## 2020-01-20 MED ORDER — APIXABAN 5 MG PO TABS
10.0000 mg | ORAL_TABLET | Freq: Once | ORAL | Status: AC
  Administered 2020-01-20: 10 mg via ORAL
  Filled 2020-01-20: qty 2

## 2020-01-20 MED ORDER — APIXABAN (ELIQUIS) EDUCATION KIT FOR DVT/PE PATIENTS
PACK | Freq: Once | Status: AC
  Filled 2020-01-20: qty 1

## 2020-01-20 MED ORDER — APIXABAN 5 MG PO TABS
ORAL_TABLET | ORAL | 0 refills | Status: DC
Start: 2020-01-20 — End: 2020-03-08

## 2020-01-20 MED ORDER — APIXABAN 5 MG PO TABS
ORAL_TABLET | ORAL | 0 refills | Status: DC
Start: 2020-01-20 — End: 2020-01-20
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
## 2020-01-20 NOTE — Progress Notes (Signed)
LT lower extremity venous study completed.   See Cv Proc for preliminary results.  Preliminary results discussed with Chrys Racer, PA-C.    Darlin Coco, RDMS

## 2020-01-20 NOTE — ED Triage Notes (Signed)
Patient c/o left leg swelling x 2-3 days, but has had left leg pain x 3-4 days. Patient is currently receiving chemo.

## 2020-01-20 NOTE — Discharge Instructions (Addendum)
As discussed, your ultrasound was positive for a blood clot in your left leg. I am placing you on a blood thinner. Take as prescribed. Please call your PCP or oncologist for further evaluation of your blood clot. Stop taking Plavix until you speak with your neurologist. Please call today to discuss continuing plavix. Also, stop taking Vitamin E and Coq10 until you speak with your oncologist. You may take over the counter Tylenol as needed for pain. Avoid all NSAIDs like ibuprofen because it increases your bleeding risk. Return to the ER if you develop shortness of breath or chest pain. Return to the ER for new or worsening symptoms.   Stop Vitamin E and Coq10 due to increase bleeding risk. Stop Plavix

## 2020-01-20 NOTE — Telephone Encounter (Signed)
Patient called. Went to ED - blood clot "ankle to groin". ED started him on Eliquis and told hiim to contact Dr. Irene Limbo to ask if he could stop vitamin E and CoQ10. He was also instructed to call neurologist to ask if he could stop Plavix so he is also checking on that. Dr. Irene Limbo informed. Per Dr.Kale - he can stop Vit E and CoQ10. Patient contacted and verbalized understanding

## 2020-01-20 NOTE — Telephone Encounter (Signed)
Patient called - left leg swollen from thigh to ankle. Started 2 days ago. Steadily getting worse. Painful to walk on or bear weight. Dr. Irene Limbo informed.  Per Dr.Kale - informed patient to go to ED now for evaluation. Patient verbalized understanding and states he will go asap.

## 2020-01-20 NOTE — ED Notes (Signed)
Pt verbalizes understanding of DC instructions. Pt belongings returned and is ambulatory out of ED.  

## 2020-01-24 ENCOUNTER — Other Ambulatory Visit: Payer: Self-pay | Admitting: Hematology

## 2020-01-24 ENCOUNTER — Encounter: Payer: Self-pay | Admitting: Hematology

## 2020-01-25 NOTE — Progress Notes (Signed)
Pharmacist Chemotherapy Monitoring - Follow Up Assessment    I verify that I have reviewed each item in the below checklist:  . Regimen for the patient is scheduled for the appropriate day and plan matches scheduled date. Marland Kitchen Appropriate non-routine labs are ordered dependent on drug ordered. . If applicable, additional medications reviewed and ordered per protocol based on lifetime cumulative doses and/or treatment regimen.   Plan for follow-up and/or issues identified: No . I-vent associated with next due treatment: No . MD and/or nursing notified: No  Philomena Course 01/25/2020 8:53 AM

## 2020-01-31 ENCOUNTER — Inpatient Hospital Stay (HOSPITAL_BASED_OUTPATIENT_CLINIC_OR_DEPARTMENT_OTHER): Payer: Medicare Other | Admitting: Hematology

## 2020-01-31 ENCOUNTER — Inpatient Hospital Stay: Payer: Medicare Other | Attending: Hematology

## 2020-01-31 ENCOUNTER — Inpatient Hospital Stay: Payer: Medicare Other

## 2020-01-31 ENCOUNTER — Other Ambulatory Visit: Payer: Self-pay

## 2020-01-31 VITALS — BP 111/65 | HR 80 | Temp 98.1°F | Resp 18 | Ht 71.0 in | Wt 209.0 lb

## 2020-01-31 DIAGNOSIS — C9000 Multiple myeloma not having achieved remission: Secondary | ICD-10-CM

## 2020-01-31 DIAGNOSIS — Z5112 Encounter for antineoplastic immunotherapy: Secondary | ICD-10-CM | POA: Insufficient documentation

## 2020-01-31 DIAGNOSIS — I82419 Acute embolism and thrombosis of unspecified femoral vein: Secondary | ICD-10-CM | POA: Diagnosis not present

## 2020-01-31 DIAGNOSIS — Z95828 Presence of other vascular implants and grafts: Secondary | ICD-10-CM

## 2020-01-31 DIAGNOSIS — Z79899 Other long term (current) drug therapy: Secondary | ICD-10-CM | POA: Insufficient documentation

## 2020-01-31 DIAGNOSIS — Z7189 Other specified counseling: Secondary | ICD-10-CM

## 2020-01-31 LAB — CMP (CANCER CENTER ONLY)
ALT: 22 U/L (ref 0–44)
AST: 26 U/L (ref 15–41)
Albumin: 3.5 g/dL (ref 3.5–5.0)
Alkaline Phosphatase: 74 U/L (ref 38–126)
Anion gap: 7 (ref 5–15)
BUN: 16 mg/dL (ref 8–23)
CO2: 24 mmol/L (ref 22–32)
Calcium: 9.5 mg/dL (ref 8.9–10.3)
Chloride: 105 mmol/L (ref 98–111)
Creatinine: 1.47 mg/dL — ABNORMAL HIGH (ref 0.61–1.24)
GFR, Est AFR Am: 54 mL/min — ABNORMAL LOW (ref >60)
GFR, Estimated: 47 mL/min — ABNORMAL LOW (ref >60)
Glucose, Bld: 92 mg/dL (ref 70–99)
Potassium: 4.6 mmol/L (ref 3.5–5.1)
Sodium: 136 mmol/L (ref 135–145)
Total Bilirubin: 0.6 mg/dL (ref 0.3–1.2)
Total Protein: 6.7 g/dL (ref 6.5–8.1)

## 2020-01-31 LAB — CBC WITH DIFFERENTIAL/PLATELET
Abs Immature Granulocytes: 0.02 10*3/uL (ref 0.00–0.07)
Basophils Absolute: 0.2 10*3/uL — ABNORMAL HIGH (ref 0.0–0.1)
Basophils Relative: 3 %
Eosinophils Absolute: 0.1 10*3/uL (ref 0.0–0.5)
Eosinophils Relative: 1 %
HCT: 34.6 % — ABNORMAL LOW (ref 39.0–52.0)
Hemoglobin: 11.2 g/dL — ABNORMAL LOW (ref 13.0–17.0)
Immature Granulocytes: 0 %
Lymphocytes Relative: 21 %
Lymphs Abs: 1.1 10*3/uL (ref 0.7–4.0)
MCH: 33 pg (ref 26.0–34.0)
MCHC: 32.4 g/dL (ref 30.0–36.0)
MCV: 102.1 fL — ABNORMAL HIGH (ref 80.0–100.0)
Monocytes Absolute: 0.4 10*3/uL (ref 0.1–1.0)
Monocytes Relative: 7 %
Neutro Abs: 3.6 10*3/uL (ref 1.7–7.7)
Neutrophils Relative %: 68 %
Platelets: 292 10*3/uL (ref 150–400)
RBC: 3.39 MIL/uL — ABNORMAL LOW (ref 4.22–5.81)
RDW: 15.4 % (ref 11.5–15.5)
WBC: 5.4 10*3/uL (ref 4.0–10.5)
nRBC: 0 % (ref 0.0–0.2)

## 2020-01-31 MED ORDER — SODIUM CHLORIDE 0.9% FLUSH
10.0000 mL | Freq: Once | INTRAVENOUS | Status: AC
  Administered 2020-01-31: 10 mL
  Filled 2020-01-31: qty 10

## 2020-01-31 MED ORDER — PROCHLORPERAZINE MALEATE 10 MG PO TABS
10.0000 mg | ORAL_TABLET | Freq: Once | ORAL | Status: AC
  Administered 2020-01-31: 10 mg via ORAL

## 2020-01-31 MED ORDER — SODIUM CHLORIDE 0.9% FLUSH
10.0000 mL | INTRAVENOUS | Status: DC | PRN
  Administered 2020-01-31: 10 mL
  Filled 2020-01-31: qty 10

## 2020-01-31 MED ORDER — ACETAMINOPHEN 325 MG PO TABS
650.0000 mg | ORAL_TABLET | Freq: Once | ORAL | Status: AC
  Administered 2020-01-31: 650 mg via ORAL

## 2020-01-31 MED ORDER — ACETAMINOPHEN 325 MG PO TABS
ORAL_TABLET | ORAL | Status: AC
  Filled 2020-01-31: qty 2

## 2020-01-31 MED ORDER — PROCHLORPERAZINE MALEATE 10 MG PO TABS
ORAL_TABLET | ORAL | Status: AC
  Filled 2020-01-31: qty 1

## 2020-01-31 MED ORDER — SODIUM CHLORIDE 0.9 % IV SOLN
Freq: Once | INTRAVENOUS | Status: AC
  Filled 2020-01-31: qty 250

## 2020-01-31 MED ORDER — DEXTROSE 5 % IV SOLN
56.0000 mg/m2 | Freq: Once | INTRAVENOUS | Status: AC
  Administered 2020-01-31: 120 mg via INTRAVENOUS
  Filled 2020-01-31: qty 60

## 2020-01-31 MED ORDER — SODIUM CHLORIDE 0.9 % IV SOLN
Freq: Once | INTRAVENOUS | Status: DC
  Filled 2020-01-31: qty 250

## 2020-01-31 MED ORDER — HEPARIN SOD (PORK) LOCK FLUSH 100 UNIT/ML IV SOLN
500.0000 [IU] | Freq: Once | INTRAVENOUS | Status: AC | PRN
  Administered 2020-01-31: 500 [IU]
  Filled 2020-01-31: qty 5
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
## 2020-01-31 NOTE — Patient Instructions (Signed)
[facility] Discharge Instructions for Patients Receiving Chemotherapy  Today you received the following chemotherapy agents:  Kyprolis  To help prevent nausea and vomiting after your treatment, we encourage you to take your nausea medication as directed.   If you develop nausea and vomiting that is not controlled by your nausea medication, call the clinic.   BELOW ARE SYMPTOMS THAT SHOULD BE REPORTED IMMEDIATELY:  *FEVER GREATER THAN 100.5 F  *CHILLS WITH OR WITHOUT FEVER  NAUSEA AND VOMITING THAT IS NOT CONTROLLED WITH YOUR NAUSEA MEDICATION  *UNUSUAL SHORTNESS OF BREATH  *UNUSUAL BRUISING OR BLEEDING  TENDERNESS IN MOUTH AND THROAT WITH OR WITHOUT PRESENCE OF ULCERS  *URINARY PROBLEMS  *BOWEL PROBLEMS  UNUSUAL RASH Items with * indicate a potential emergency and should be followed up as soon as possible.  Feel free to call the clinic should you have any questions or concerns. The clinic phone number is [phone].  Please show the CHEMO ALERT CARD at check-in to the Emergency Department and triage nurse.   

## 2020-02-03 ENCOUNTER — Other Ambulatory Visit: Payer: Self-pay | Admitting: *Deleted

## 2020-02-03 DIAGNOSIS — I739 Peripheral vascular disease, unspecified: Secondary | ICD-10-CM

## 2020-02-05 ENCOUNTER — Other Ambulatory Visit: Payer: Self-pay | Admitting: Hematology

## 2020-02-05 DIAGNOSIS — Z7189 Other specified counseling: Secondary | ICD-10-CM

## 2020-02-05 DIAGNOSIS — C9000 Multiple myeloma not having achieved remission: Secondary | ICD-10-CM

## 2020-02-07 ENCOUNTER — Inpatient Hospital Stay: Payer: Medicare Other

## 2020-02-07 ENCOUNTER — Other Ambulatory Visit: Payer: Self-pay

## 2020-02-07 ENCOUNTER — Ambulatory Visit: Payer: Medicare Other | Admitting: Nutrition

## 2020-02-07 ENCOUNTER — Encounter: Payer: Medicare Other | Admitting: Nutrition

## 2020-02-07 VITALS — BP 122/74 | HR 72 | Temp 98.6°F | Resp 18

## 2020-02-07 DIAGNOSIS — C9000 Multiple myeloma not having achieved remission: Secondary | ICD-10-CM

## 2020-02-07 DIAGNOSIS — Z5112 Encounter for antineoplastic immunotherapy: Secondary | ICD-10-CM | POA: Diagnosis not present

## 2020-02-07 DIAGNOSIS — Z79899 Other long term (current) drug therapy: Secondary | ICD-10-CM | POA: Diagnosis not present

## 2020-02-07 DIAGNOSIS — Z95828 Presence of other vascular implants and grafts: Secondary | ICD-10-CM

## 2020-02-07 DIAGNOSIS — Z7189 Other specified counseling: Secondary | ICD-10-CM

## 2020-02-07 LAB — CBC WITH DIFFERENTIAL/PLATELET
Abs Immature Granulocytes: 0.02 10*3/uL (ref 0.00–0.07)
Basophils Absolute: 0 10*3/uL (ref 0.0–0.1)
Basophils Relative: 1 %
Eosinophils Absolute: 0.1 10*3/uL (ref 0.0–0.5)
Eosinophils Relative: 2 %
HCT: 34.1 % — ABNORMAL LOW (ref 39.0–52.0)
Hemoglobin: 11 g/dL — ABNORMAL LOW (ref 13.0–17.0)
Immature Granulocytes: 1 %
Lymphocytes Relative: 24 %
Lymphs Abs: 0.9 10*3/uL (ref 0.7–4.0)
MCH: 33.7 pg (ref 26.0–34.0)
MCHC: 32.3 g/dL (ref 30.0–36.0)
MCV: 104.6 fL — ABNORMAL HIGH (ref 80.0–100.0)
Monocytes Absolute: 0.2 10*3/uL (ref 0.1–1.0)
Monocytes Relative: 6 %
Neutro Abs: 2.6 10*3/uL (ref 1.7–7.7)
Neutrophils Relative %: 66 %
Platelets: 144 10*3/uL — ABNORMAL LOW (ref 150–400)
RBC: 3.26 MIL/uL — ABNORMAL LOW (ref 4.22–5.81)
RDW: 15.8 % — ABNORMAL HIGH (ref 11.5–15.5)
WBC: 3.9 10*3/uL — ABNORMAL LOW (ref 4.0–10.5)
nRBC: 0 % (ref 0.0–0.2)

## 2020-02-07 LAB — CMP (CANCER CENTER ONLY)
ALT: 12 U/L (ref 0–44)
AST: 19 U/L (ref 15–41)
Albumin: 3.5 g/dL (ref 3.5–5.0)
Alkaline Phosphatase: 63 U/L (ref 38–126)
Anion gap: 8 (ref 5–15)
BUN: 16 mg/dL (ref 8–23)
CO2: 26 mmol/L (ref 22–32)
Calcium: 9.6 mg/dL (ref 8.9–10.3)
Chloride: 106 mmol/L (ref 98–111)
Creatinine: 1.44 mg/dL — ABNORMAL HIGH (ref 0.61–1.24)
GFR, Est AFR Am: 55 mL/min — ABNORMAL LOW (ref >60)
GFR, Estimated: 48 mL/min — ABNORMAL LOW (ref >60)
Glucose, Bld: 97 mg/dL (ref 70–99)
Potassium: 4.8 mmol/L (ref 3.5–5.1)
Sodium: 140 mmol/L (ref 135–145)
Total Bilirubin: 0.8 mg/dL (ref 0.3–1.2)
Total Protein: 6.3 g/dL — ABNORMAL LOW (ref 6.5–8.1)

## 2020-02-07 MED ORDER — ACETAMINOPHEN 325 MG PO TABS
650.0000 mg | ORAL_TABLET | Freq: Once | ORAL | Status: AC
  Administered 2020-02-07: 650 mg via ORAL

## 2020-02-07 MED ORDER — HEPARIN SOD (PORK) LOCK FLUSH 100 UNIT/ML IV SOLN
500.0000 [IU] | Freq: Once | INTRAVENOUS | Status: AC | PRN
  Administered 2020-02-07: 500 [IU]
  Filled 2020-02-07: qty 5

## 2020-02-07 MED ORDER — SODIUM CHLORIDE 0.9% FLUSH
10.0000 mL | INTRAVENOUS | Status: DC | PRN
  Administered 2020-02-07: 10 mL
  Filled 2020-02-07: qty 10

## 2020-02-07 MED ORDER — DEXTROSE 5 % IV SOLN
56.0000 mg/m2 | Freq: Once | INTRAVENOUS | Status: AC
  Administered 2020-02-07: 120 mg via INTRAVENOUS
  Filled 2020-02-07: qty 60

## 2020-02-07 MED ORDER — PROCHLORPERAZINE MALEATE 10 MG PO TABS
10.0000 mg | ORAL_TABLET | Freq: Once | ORAL | Status: AC
  Administered 2020-02-07: 10 mg via ORAL

## 2020-02-07 MED ORDER — SODIUM CHLORIDE 0.9 % IV SOLN
Freq: Once | INTRAVENOUS | Status: AC
  Filled 2020-02-07: qty 250

## 2020-02-07 MED ORDER — ACETAMINOPHEN 325 MG PO TABS
ORAL_TABLET | ORAL | Status: AC
  Filled 2020-02-07: qty 2

## 2020-02-07 MED ORDER — SODIUM CHLORIDE 0.9% FLUSH
10.0000 mL | Freq: Once | INTRAVENOUS | Status: AC
  Administered 2020-02-07: 10 mL
  Filled 2020-02-07: qty 10

## 2020-02-07 MED ORDER — PROCHLORPERAZINE MALEATE 10 MG PO TABS
ORAL_TABLET | ORAL | Status: AC
  Filled 2020-02-07: qty 1

## 2020-02-07 MED ORDER — SODIUM CHLORIDE 0.9 % IV SOLN
Freq: Once | INTRAVENOUS | Status: DC
  Filled 2020-02-07: qty 250

## 2020-02-07 NOTE — Telephone Encounter (Signed)
Please review for refill.  
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
## 2020-02-07 NOTE — Patient Instructions (Signed)
[facility] Discharge Instructions for Patients Receiving Chemotherapy  Today you received the following chemotherapy agents kyprolis  To help prevent nausea and vomiting after your treatment, we encourage you to take your nausea medication as directed.   If you develop nausea and vomiting that is not controlled by your nausea medication, call the clinic.   BELOW ARE SYMPTOMS THAT SHOULD BE REPORTED IMMEDIATELY:  *FEVER GREATER THAN 100.5 F  *CHILLS WITH OR WITHOUT FEVER  NAUSEA AND VOMITING THAT IS NOT CONTROLLED WITH YOUR NAUSEA MEDICATION  *UNUSUAL SHORTNESS OF BREATH  *UNUSUAL BRUISING OR BLEEDING  TENDERNESS IN MOUTH AND THROAT WITH OR WITHOUT PRESENCE OF ULCERS  *URINARY PROBLEMS  *BOWEL PROBLEMS  UNUSUAL RASH Items with * indicate a potential emergency and should be followed up as soon as possible.  Feel free to call the clinic should you have any questions or concerns. The clinic phone number is [phone].  Please show the CHEMO ALERT CARD at check-in to the Emergency Department and triage nurse.   

## 2020-02-08 DIAGNOSIS — C9000 Multiple myeloma not having achieved remission: Secondary | ICD-10-CM | POA: Diagnosis not present

## 2020-02-10 LAB — MULTIPLE MYELOMA PANEL, SERUM
Albumin SerPl Elph-Mcnc: 3.4 g/dL (ref 2.9–4.4)
Albumin/Glob SerPl: 1.5 (ref 0.7–1.7)
Alpha 1: 0.2 g/dL (ref 0.0–0.4)
Alpha2 Glob SerPl Elph-Mcnc: 0.6 g/dL (ref 0.4–1.0)
B-Globulin SerPl Elph-Mcnc: 0.9 g/dL (ref 0.7–1.3)
Gamma Glob SerPl Elph-Mcnc: 0.6 g/dL (ref 0.4–1.8)
Globulin, Total: 2.3 g/dL (ref 2.2–3.9)
IgA: 23 mg/dL — ABNORMAL LOW (ref 61–437)
IgG (Immunoglobin G), Serum: 658 mg/dL (ref 603–1613)
IgM (Immunoglobulin M), Srm: <5 mg/dL — ABNORMAL LOW (ref 15–143)
M Protein SerPl Elph-Mcnc: 0.2 g/dL — ABNORMAL HIGH
Total Protein ELP: 5.7 g/dL — ABNORMAL LOW (ref 6.0–8.5)

## 2020-02-11 ENCOUNTER — Other Ambulatory Visit: Payer: Self-pay

## 2020-02-11 ENCOUNTER — Ambulatory Visit (HOSPITAL_COMMUNITY)
Admission: RE | Admit: 2020-02-11 | Discharge: 2020-02-11 | Disposition: A | Discharge status: Home / Self Care | Payer: Medicare Other | Source: Ambulatory Visit | Attending: Internal Medicine | Admitting: Internal Medicine

## 2020-02-11 DIAGNOSIS — I739 Peripheral vascular disease, unspecified: Secondary | ICD-10-CM | POA: Diagnosis not present

## 2020-02-14 ENCOUNTER — Other Ambulatory Visit: Payer: Self-pay

## 2020-02-14 ENCOUNTER — Inpatient Hospital Stay: Payer: Medicare Other

## 2020-02-14 ENCOUNTER — Inpatient Hospital Stay (HOSPITAL_BASED_OUTPATIENT_CLINIC_OR_DEPARTMENT_OTHER): Payer: Medicare Other | Admitting: Hematology

## 2020-02-14 VITALS — BP 122/83 | HR 89 | Temp 98.6°F | Resp 18 | Wt 209.5 lb

## 2020-02-14 DIAGNOSIS — C9000 Multiple myeloma not having achieved remission: Secondary | ICD-10-CM

## 2020-02-14 DIAGNOSIS — Z79899 Other long term (current) drug therapy: Secondary | ICD-10-CM | POA: Diagnosis not present

## 2020-02-14 DIAGNOSIS — Z7189 Other specified counseling: Secondary | ICD-10-CM

## 2020-02-14 DIAGNOSIS — Z5112 Encounter for antineoplastic immunotherapy: Secondary | ICD-10-CM | POA: Diagnosis not present

## 2020-02-14 DIAGNOSIS — C7951 Secondary malignant neoplasm of bone: Secondary | ICD-10-CM

## 2020-02-14 DIAGNOSIS — Z5111 Encounter for antineoplastic chemotherapy: Secondary | ICD-10-CM

## 2020-02-14 DIAGNOSIS — I82419 Acute embolism and thrombosis of unspecified femoral vein: Secondary | ICD-10-CM | POA: Diagnosis not present

## 2020-02-14 DIAGNOSIS — Z95828 Presence of other vascular implants and grafts: Secondary | ICD-10-CM

## 2020-02-14 LAB — CMP (CANCER CENTER ONLY)
ALT: 14 U/L (ref 0–44)
AST: 19 U/L (ref 15–41)
Albumin: 3.8 g/dL (ref 3.5–5.0)
Alkaline Phosphatase: 68 U/L (ref 38–126)
Anion gap: 9 (ref 5–15)
BUN: 13 mg/dL (ref 8–23)
CO2: 25 mmol/L (ref 22–32)
Calcium: 9.9 mg/dL (ref 8.9–10.3)
Chloride: 105 mmol/L (ref 98–111)
Creatinine: 1.34 mg/dL — ABNORMAL HIGH (ref 0.61–1.24)
GFR, Est AFR Am: >60 mL/min (ref >60)
GFR, Estimated: 52 mL/min — ABNORMAL LOW (ref >60)
Glucose, Bld: 106 mg/dL — ABNORMAL HIGH (ref 70–99)
Potassium: 4.4 mmol/L (ref 3.5–5.1)
Sodium: 139 mmol/L (ref 135–145)
Total Bilirubin: 1 mg/dL (ref 0.3–1.2)
Total Protein: 6.8 g/dL (ref 6.5–8.1)

## 2020-02-14 LAB — CBC WITH DIFFERENTIAL/PLATELET
Abs Immature Granulocytes: 0.02 10*3/uL (ref 0.00–0.07)
Basophils Absolute: 0 10*3/uL (ref 0.0–0.1)
Basophils Relative: 0 %
Eosinophils Absolute: 0 10*3/uL (ref 0.0–0.5)
Eosinophils Relative: 0 %
HCT: 34.6 % — ABNORMAL LOW (ref 39.0–52.0)
Hemoglobin: 11.3 g/dL — ABNORMAL LOW (ref 13.0–17.0)
Immature Granulocytes: 0 %
Lymphocytes Relative: 12 %
Lymphs Abs: 0.6 10*3/uL — ABNORMAL LOW (ref 0.7–4.0)
MCH: 33.7 pg (ref 26.0–34.0)
MCHC: 32.7 g/dL (ref 30.0–36.0)
MCV: 103.3 fL — ABNORMAL HIGH (ref 80.0–100.0)
Monocytes Absolute: 0.1 10*3/uL (ref 0.1–1.0)
Monocytes Relative: 1 %
Neutro Abs: 4.6 10*3/uL (ref 1.7–7.7)
Neutrophils Relative %: 87 %
Platelets: 157 10*3/uL (ref 150–400)
RBC: 3.35 MIL/uL — ABNORMAL LOW (ref 4.22–5.81)
RDW: 16.5 % — ABNORMAL HIGH (ref 11.5–15.5)
WBC: 5.3 10*3/uL (ref 4.0–10.5)
nRBC: 0 % (ref 0.0–0.2)

## 2020-02-14 MED ORDER — DEXTROSE 5 % IV SOLN
56.0000 mg/m2 | Freq: Once | INTRAVENOUS | Status: AC
  Administered 2020-02-14: 120 mg via INTRAVENOUS
  Filled 2020-02-14: qty 60

## 2020-02-14 MED ORDER — ACETAMINOPHEN 325 MG PO TABS
650.0000 mg | ORAL_TABLET | Freq: Once | ORAL | Status: AC
  Administered 2020-02-14: 650 mg via ORAL

## 2020-02-14 MED ORDER — SODIUM CHLORIDE 0.9 % IV SOLN
60.0000 mg | Freq: Once | INTRAVENOUS | Status: AC
  Administered 2020-02-14: 60 mg via INTRAVENOUS
  Filled 2020-02-14: qty 10

## 2020-02-14 MED ORDER — PROCHLORPERAZINE MALEATE 10 MG PO TABS
10.0000 mg | ORAL_TABLET | Freq: Once | ORAL | Status: AC
  Administered 2020-02-14: 10 mg via ORAL

## 2020-02-14 MED ORDER — SODIUM CHLORIDE 0.9% FLUSH
10.0000 mL | INTRAVENOUS | Status: DC | PRN
  Administered 2020-02-14: 10 mL
  Filled 2020-02-14: qty 10

## 2020-02-14 MED ORDER — HEPARIN SOD (PORK) LOCK FLUSH 100 UNIT/ML IV SOLN
500.0000 [IU] | Freq: Once | INTRAVENOUS | Status: AC | PRN
  Administered 2020-02-14: 500 [IU]
  Filled 2020-02-14: qty 5

## 2020-02-14 MED ORDER — SODIUM CHLORIDE 0.9 % IV SOLN
Freq: Once | INTRAVENOUS | Status: DC
  Filled 2020-02-14: qty 250

## 2020-02-14 MED ORDER — PROCHLORPERAZINE MALEATE 10 MG PO TABS
ORAL_TABLET | ORAL | Status: AC
  Filled 2020-02-14: qty 1

## 2020-02-14 MED ORDER — SODIUM CHLORIDE 0.9 % IV SOLN
Freq: Once | INTRAVENOUS | Status: AC
  Filled 2020-02-14: qty 250

## 2020-02-14 MED ORDER — ACETAMINOPHEN 325 MG PO TABS
ORAL_TABLET | ORAL | Status: AC
  Filled 2020-02-14: qty 2

## 2020-02-14 NOTE — Progress Notes (Signed)
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 the above. Brunetta Genera MD

## 2020-02-14 NOTE — Patient Instructions (Addendum)
[place] [facility] Discharge Instructions for Patients Receiving Chemotherapy  Today you received the following chemotherapy agents: kyprolis, aredia  To help prevent nausea and vomiting after your treatment, we encourage you to take your nausea medication as directed.    If you develop nausea and vomiting that is not controlled by your nausea medication, call the clinic.   BELOW ARE SYMPTOMS THAT SHOULD BE REPORTED IMMEDIATELY:  *FEVER GREATER THAN 100.5 F  *CHILLS WITH OR WITHOUT FEVER  NAUSEA AND VOMITING THAT IS NOT CONTROLLED WITH YOUR NAUSEA MEDICATION  *UNUSUAL SHORTNESS OF BREATH  *UNUSUAL BRUISING OR BLEEDING  TENDERNESS IN MOUTH AND THROAT WITH OR WITHOUT PRESENCE OF ULCERS  *URINARY PROBLEMS  *BOWEL PROBLEMS  UNUSUAL RASH Items with * indicate a potential emergency and should be followed up as soon as possible.  Feel free to call the clinic should you have any questions or concerns. The clinic phone number is [phone].  Please show the [facility] at check-in to the Emergency Department and triage nurse.

## 2020-02-15 ENCOUNTER — Ambulatory Visit (INDEPENDENT_AMBULATORY_CARE_PROVIDER_SITE_OTHER): Payer: Medicare Other | Admitting: Vascular Surgery

## 2020-02-15 ENCOUNTER — Encounter: Payer: Self-pay | Admitting: Vascular Surgery

## 2020-02-15 ENCOUNTER — Telehealth: Payer: Self-pay | Admitting: Hematology

## 2020-02-15 DIAGNOSIS — I82409 Acute embolism and thrombosis of unspecified deep veins of unspecified lower extremity: Secondary | ICD-10-CM | POA: Insufficient documentation

## 2020-02-15 DIAGNOSIS — I82412 Acute embolism and thrombosis of left femoral vein: Secondary | ICD-10-CM | POA: Diagnosis not present
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
## 2020-02-15 NOTE — Telephone Encounter (Signed)
Scheduled per 07/27 los, spoke with patient's wife. Patient will be notified of upcoming appointment. 

## 2020-02-22 DIAGNOSIS — C9000 Multiple myeloma not having achieved remission: Secondary | ICD-10-CM | POA: Diagnosis not present

## 2020-02-22 DIAGNOSIS — Z01818 Encounter for other preprocedural examination: Secondary | ICD-10-CM | POA: Diagnosis not present

## 2020-02-22 DIAGNOSIS — I825Z9 Chronic embolism and thrombosis of unspecified deep veins of unspecified distal lower extremity: Secondary | ICD-10-CM | POA: Diagnosis not present

## 2020-02-22 DIAGNOSIS — N183 Chronic kidney disease, stage 3 unspecified: Secondary | ICD-10-CM | POA: Diagnosis not present

## 2020-02-22 DIAGNOSIS — I129 Hypertensive chronic kidney disease with stage 1 through stage 4 chronic kidney disease, or unspecified chronic kidney disease: Secondary | ICD-10-CM | POA: Diagnosis not present

## 2020-02-22 DIAGNOSIS — Z79899 Other long term (current) drug therapy: Secondary | ICD-10-CM | POA: Diagnosis not present

## 2020-02-23 ENCOUNTER — Other Ambulatory Visit: Payer: Self-pay | Admitting: Hematology

## 2020-02-24 DIAGNOSIS — I491 Atrial premature depolarization: Secondary | ICD-10-CM | POA: Diagnosis not present

## 2020-02-24 DIAGNOSIS — Z01818 Encounter for other preprocedural examination: Secondary | ICD-10-CM | POA: Diagnosis not present

## 2020-02-24 DIAGNOSIS — I083 Combined rheumatic disorders of mitral, aortic and tricuspid valves: Secondary | ICD-10-CM | POA: Diagnosis not present

## 2020-02-28 ENCOUNTER — Inpatient Hospital Stay: Payer: Medicare Other

## 2020-02-28 ENCOUNTER — Other Ambulatory Visit: Payer: Self-pay

## 2020-02-28 ENCOUNTER — Inpatient Hospital Stay: Payer: Medicare Other | Attending: Hematology

## 2020-02-28 ENCOUNTER — Other Ambulatory Visit: Payer: Self-pay | Admitting: Hematology

## 2020-02-28 ENCOUNTER — Telehealth: Payer: Self-pay | Admitting: Hematology

## 2020-02-28 VITALS — BP 119/67 | HR 99 | Temp 98.4°F | Resp 18 | Wt 212.0 lb

## 2020-02-28 DIAGNOSIS — C9000 Multiple myeloma not having achieved remission: Secondary | ICD-10-CM

## 2020-02-28 DIAGNOSIS — Z5112 Encounter for antineoplastic immunotherapy: Secondary | ICD-10-CM | POA: Diagnosis not present

## 2020-02-28 DIAGNOSIS — Z79899 Other long term (current) drug therapy: Secondary | ICD-10-CM | POA: Insufficient documentation

## 2020-02-28 DIAGNOSIS — Z95828 Presence of other vascular implants and grafts: Secondary | ICD-10-CM

## 2020-02-28 DIAGNOSIS — Z7189 Other specified counseling: Secondary | ICD-10-CM

## 2020-02-28 DIAGNOSIS — C7951 Secondary malignant neoplasm of bone: Secondary | ICD-10-CM

## 2020-02-28 LAB — CMP (CANCER CENTER ONLY)
ALT: 12 U/L (ref 0–44)
AST: 18 U/L (ref 15–41)
Albumin: 3.9 g/dL (ref 3.5–5.0)
Alkaline Phosphatase: 63 U/L (ref 38–126)
Anion gap: 10 (ref 5–15)
BUN: 15 mg/dL (ref 8–23)
CO2: 24 mmol/L (ref 22–32)
Calcium: 9.8 mg/dL (ref 8.9–10.3)
Chloride: 106 mmol/L (ref 98–111)
Creatinine: 1.53 mg/dL — ABNORMAL HIGH (ref 0.61–1.24)
GFR, Est AFR Am: 52 mL/min — ABNORMAL LOW (ref >60)
GFR, Estimated: 44 mL/min — ABNORMAL LOW (ref >60)
Glucose, Bld: 176 mg/dL — ABNORMAL HIGH (ref 70–99)
Potassium: 4.1 mmol/L (ref 3.5–5.1)
Sodium: 140 mmol/L (ref 135–145)
Total Bilirubin: 0.7 mg/dL (ref 0.3–1.2)
Total Protein: 6.7 g/dL (ref 6.5–8.1)

## 2020-02-28 LAB — CBC WITH DIFFERENTIAL/PLATELET
Abs Immature Granulocytes: 0.01 10*3/uL (ref 0.00–0.07)
Basophils Absolute: 0 10*3/uL (ref 0.0–0.1)
Basophils Relative: 0 %
Eosinophils Absolute: 0 10*3/uL (ref 0.0–0.5)
Eosinophils Relative: 0 %
HCT: 34.6 % — ABNORMAL LOW (ref 39.0–52.0)
Hemoglobin: 11.5 g/dL — ABNORMAL LOW (ref 13.0–17.0)
Immature Granulocytes: 0 %
Lymphocytes Relative: 12 %
Lymphs Abs: 0.6 10*3/uL — ABNORMAL LOW (ref 0.7–4.0)
MCH: 35 pg — ABNORMAL HIGH (ref 26.0–34.0)
MCHC: 33.2 g/dL (ref 30.0–36.0)
MCV: 105.2 fL — ABNORMAL HIGH (ref 80.0–100.0)
Monocytes Absolute: 0 10*3/uL — ABNORMAL LOW (ref 0.1–1.0)
Monocytes Relative: 1 %
Neutro Abs: 4.1 10*3/uL (ref 1.7–7.7)
Neutrophils Relative %: 87 %
Platelets: 229 10*3/uL (ref 150–400)
RBC: 3.29 MIL/uL — ABNORMAL LOW (ref 4.22–5.81)
RDW: 15.4 % (ref 11.5–15.5)
WBC: 4.7 10*3/uL (ref 4.0–10.5)
nRBC: 0 % (ref 0.0–0.2)

## 2020-02-28 MED ORDER — PROCHLORPERAZINE MALEATE 10 MG PO TABS
ORAL_TABLET | ORAL | Status: AC
  Filled 2020-02-28: qty 1

## 2020-02-28 MED ORDER — HEPARIN SOD (PORK) LOCK FLUSH 100 UNIT/ML IV SOLN
500.0000 [IU] | Freq: Once | INTRAVENOUS | Status: AC | PRN
  Administered 2020-02-28: 500 [IU]
  Filled 2020-02-28: qty 5

## 2020-02-28 MED ORDER — SODIUM CHLORIDE 0.9% FLUSH
10.0000 mL | Freq: Once | INTRAVENOUS | Status: AC
  Administered 2020-02-28: 10 mL
  Filled 2020-02-28: qty 10

## 2020-02-28 MED ORDER — ACETAMINOPHEN 325 MG PO TABS
650.0000 mg | ORAL_TABLET | Freq: Once | ORAL | Status: AC
  Administered 2020-02-28: 650 mg via ORAL

## 2020-02-28 MED ORDER — SODIUM CHLORIDE 0.9 % IV SOLN
Freq: Once | INTRAVENOUS | Status: DC
  Filled 2020-02-28: qty 250

## 2020-02-28 MED ORDER — SODIUM CHLORIDE 0.9% FLUSH
10.0000 mL | INTRAVENOUS | Status: DC | PRN
  Administered 2020-02-28: 10 mL
  Filled 2020-02-28: qty 10

## 2020-02-28 MED ORDER — PROCHLORPERAZINE MALEATE 10 MG PO TABS
10.0000 mg | ORAL_TABLET | Freq: Once | ORAL | Status: AC
  Administered 2020-02-28: 10 mg via ORAL

## 2020-02-28 MED ORDER — DEXTROSE 5 % IV SOLN
56.0000 mg/m2 | Freq: Once | INTRAVENOUS | Status: AC
  Administered 2020-02-28: 120 mg via INTRAVENOUS
  Filled 2020-02-28: qty 60

## 2020-02-28 MED ORDER — SODIUM CHLORIDE 0.9 % IV SOLN
Freq: Once | INTRAVENOUS | Status: AC
  Filled 2020-02-28: qty 250

## 2020-02-28 MED ORDER — ACETAMINOPHEN 325 MG PO TABS
ORAL_TABLET | ORAL | Status: AC
  Filled 2020-02-28: qty 2

## 2020-02-28 NOTE — Patient Instructions (Addendum)
[facility] Discharge Instructions for Patients Receiving Chemotherapy  Today you received the following chemotherapy agents kyprolis  To help prevent nausea and vomiting after your treatment, we encourage you to take your nausea medication as directed.   If you develop nausea and vomiting that is not controlled by your nausea medication, call the clinic.   BELOW ARE SYMPTOMS THAT SHOULD BE REPORTED IMMEDIATELY:  *FEVER GREATER THAN 100.5 F  *CHILLS WITH OR WITHOUT FEVER  NAUSEA AND VOMITING THAT IS NOT CONTROLLED WITH YOUR NAUSEA MEDICATION  *UNUSUAL SHORTNESS OF BREATH  *UNUSUAL BRUISING OR BLEEDING  TENDERNESS IN MOUTH AND THROAT WITH OR WITHOUT PRESENCE OF ULCERS  *URINARY PROBLEMS  *BOWEL PROBLEMS  UNUSUAL RASH Items with * indicate a potential emergency and should be followed up as soon as possible.  Feel free to call the clinic should you have any questions or concerns. The clinic phone number is [phone].  Please show the CHEMO ALERT CARD at check-in to the Emergency Department and triage nurse.   

## 2020-02-28 NOTE — Telephone Encounter (Signed)
Scheduled per 8/9 sch message. Messaged RN Carmell Austria to give pt appt calendar during treatment

## 2020-02-28 NOTE — Progress Notes (Signed)
Per Dr. Irene Limbo, ok to treat with Scr 1.53.

## 2020-02-29 DIAGNOSIS — C9000 Multiple myeloma not having achieved remission: Secondary | ICD-10-CM | POA: Diagnosis not present

## 2020-02-29 DIAGNOSIS — Z01818 Encounter for other preprocedural examination: Secondary | ICD-10-CM | POA: Diagnosis not present

## 2020-03-01 DIAGNOSIS — Z01818 Encounter for other preprocedural examination: Secondary | ICD-10-CM | POA: Diagnosis not present

## 2020-03-01 DIAGNOSIS — C9000 Multiple myeloma not having achieved remission: Secondary | ICD-10-CM | POA: Diagnosis not present

## 2020-03-06 ENCOUNTER — Inpatient Hospital Stay: Payer: Medicare Other

## 2020-03-06 ENCOUNTER — Inpatient Hospital Stay: Payer: Medicare Other | Admitting: Hematology

## 2020-03-07 DIAGNOSIS — Z52011 Autologous donor, stem cells: Secondary | ICD-10-CM | POA: Diagnosis not present

## 2020-03-07 DIAGNOSIS — C9000 Multiple myeloma not having achieved remission: Secondary | ICD-10-CM | POA: Diagnosis not present

## 2020-03-07 DIAGNOSIS — N183 Chronic kidney disease, stage 3 unspecified: Secondary | ICD-10-CM | POA: Diagnosis not present

## 2020-03-07 DIAGNOSIS — Z7901 Long term (current) use of anticoagulants: Secondary | ICD-10-CM | POA: Diagnosis not present

## 2020-03-07 DIAGNOSIS — I6782 Cerebral ischemia: Secondary | ICD-10-CM | POA: Diagnosis not present

## 2020-03-07 DIAGNOSIS — Z86718 Personal history of other venous thrombosis and embolism: Secondary | ICD-10-CM | POA: Diagnosis not present

## 2020-03-07 DIAGNOSIS — I82409 Acute embolism and thrombosis of unspecified deep veins of unspecified lower extremity: Secondary | ICD-10-CM | POA: Diagnosis not present

## 2020-03-08 ENCOUNTER — Other Ambulatory Visit: Payer: Self-pay | Admitting: *Deleted

## 2020-03-08 DIAGNOSIS — I82419 Acute embolism and thrombosis of unspecified femoral vein: Secondary | ICD-10-CM

## 2020-03-08 DIAGNOSIS — C9000 Multiple myeloma not having achieved remission: Secondary | ICD-10-CM

## 2020-03-08 MED ORDER — APIXABAN 5 MG PO TABS: ORAL_TABLET | ORAL | 0 refills | Status: DC

## 2020-03-08 NOTE — Telephone Encounter (Addendum)
Patient requested refill of Eliquis Refill verbal auth by Dr.Kale

## 2020-03-11 ENCOUNTER — Encounter: Payer: Self-pay | Admitting: Hematology

## 2020-03-11 DIAGNOSIS — C9000 Multiple myeloma not having achieved remission: Secondary | ICD-10-CM | POA: Diagnosis not present

## 2020-03-12 DIAGNOSIS — Z52091 Other blood donor, stem cells: Secondary | ICD-10-CM | POA: Diagnosis not present

## 2020-03-12 DIAGNOSIS — C9000 Multiple myeloma not having achieved remission: Secondary | ICD-10-CM | POA: Diagnosis not present

## 2020-03-13 ENCOUNTER — Other Ambulatory Visit: Payer: Medicare Other

## 2020-03-13 ENCOUNTER — Ambulatory Visit: Payer: Medicare Other

## 2020-03-13 DIAGNOSIS — Z52011 Autologous donor, stem cells: Secondary | ICD-10-CM | POA: Diagnosis not present

## 2020-03-13 DIAGNOSIS — C9000 Multiple myeloma not having achieved remission: Secondary | ICD-10-CM | POA: Diagnosis not present

## 2020-03-14 DIAGNOSIS — C9000 Multiple myeloma not having achieved remission: Secondary | ICD-10-CM | POA: Diagnosis not present

## 2020-03-14 DIAGNOSIS — Z79899 Other long term (current) drug therapy: Secondary | ICD-10-CM | POA: Diagnosis not present

## 2020-03-15 DIAGNOSIS — Z52091 Other blood donor, stem cells: Secondary | ICD-10-CM | POA: Diagnosis not present

## 2020-03-15 DIAGNOSIS — C9000 Multiple myeloma not having achieved remission: Secondary | ICD-10-CM | POA: Diagnosis not present

## 2020-03-16 DIAGNOSIS — Z52091 Other blood donor, stem cells: Secondary | ICD-10-CM | POA: Diagnosis not present

## 2020-03-16 DIAGNOSIS — C9000 Multiple myeloma not having achieved remission: Secondary | ICD-10-CM | POA: Diagnosis not present

## 2020-03-20 DIAGNOSIS — Z01812 Encounter for preprocedural laboratory examination: Secondary | ICD-10-CM | POA: Diagnosis not present

## 2020-03-20 DIAGNOSIS — C9000 Multiple myeloma not having achieved remission: Secondary | ICD-10-CM | POA: Diagnosis not present

## 2020-03-20 DIAGNOSIS — Z20822 Contact with and (suspected) exposure to covid-19: Secondary | ICD-10-CM | POA: Diagnosis not present

## 2020-03-22 DIAGNOSIS — Z86718 Personal history of other venous thrombosis and embolism: Secondary | ICD-10-CM | POA: Diagnosis not present

## 2020-03-22 DIAGNOSIS — C9000 Multiple myeloma not having achieved remission: Secondary | ICD-10-CM | POA: Diagnosis not present

## 2020-03-22 DIAGNOSIS — D61810 Antineoplastic chemotherapy induced pancytopenia: Secondary | ICD-10-CM | POA: Diagnosis not present

## 2020-03-22 DIAGNOSIS — Z79899 Other long term (current) drug therapy: Secondary | ICD-10-CM | POA: Diagnosis not present

## 2020-03-22 DIAGNOSIS — T451X5D Adverse effect of antineoplastic and immunosuppressive drugs, subsequent encounter: Secondary | ICD-10-CM | POA: Diagnosis not present

## 2020-03-22 DIAGNOSIS — Z7901 Long term (current) use of anticoagulants: Secondary | ICD-10-CM | POA: Diagnosis not present

## 2020-03-22 DIAGNOSIS — G629 Polyneuropathy, unspecified: Secondary | ICD-10-CM | POA: Diagnosis not present

## 2020-03-22 DIAGNOSIS — R112 Nausea with vomiting, unspecified: Secondary | ICD-10-CM | POA: Diagnosis not present

## 2020-03-23 DIAGNOSIS — I82412 Acute embolism and thrombosis of left femoral vein: Secondary | ICD-10-CM | POA: Diagnosis not present

## 2020-03-23 DIAGNOSIS — Z7901 Long term (current) use of anticoagulants: Secondary | ICD-10-CM | POA: Diagnosis not present

## 2020-03-23 DIAGNOSIS — G629 Polyneuropathy, unspecified: Secondary | ICD-10-CM | POA: Diagnosis not present

## 2020-03-23 DIAGNOSIS — Z9484 Stem cells transplant status: Secondary | ICD-10-CM | POA: Diagnosis not present

## 2020-03-23 DIAGNOSIS — Z79899 Other long term (current) drug therapy: Secondary | ICD-10-CM | POA: Diagnosis not present

## 2020-03-23 DIAGNOSIS — Z7952 Long term (current) use of systemic steroids: Secondary | ICD-10-CM | POA: Diagnosis not present

## 2020-03-23 DIAGNOSIS — C9000 Multiple myeloma not having achieved remission: Secondary | ICD-10-CM | POA: Diagnosis not present

## 2020-03-23 DIAGNOSIS — T451X5S Adverse effect of antineoplastic and immunosuppressive drugs, sequela: Secondary | ICD-10-CM | POA: Diagnosis not present

## 2020-03-23 DIAGNOSIS — D61810 Antineoplastic chemotherapy induced pancytopenia: Secondary | ICD-10-CM | POA: Diagnosis not present

## 2020-03-24 DIAGNOSIS — D61810 Antineoplastic chemotherapy induced pancytopenia: Secondary | ICD-10-CM | POA: Diagnosis not present

## 2020-03-24 DIAGNOSIS — Z9484 Stem cells transplant status: Secondary | ICD-10-CM | POA: Diagnosis not present

## 2020-03-24 DIAGNOSIS — T451X5S Adverse effect of antineoplastic and immunosuppressive drugs, sequela: Secondary | ICD-10-CM | POA: Diagnosis not present

## 2020-03-24 DIAGNOSIS — Z452 Encounter for adjustment and management of vascular access device: Secondary | ICD-10-CM | POA: Diagnosis not present

## 2020-03-24 DIAGNOSIS — C9000 Multiple myeloma not having achieved remission: Secondary | ICD-10-CM | POA: Diagnosis not present

## 2020-03-25 DIAGNOSIS — I82412 Acute embolism and thrombosis of left femoral vein: Secondary | ICD-10-CM | POA: Diagnosis not present

## 2020-03-25 DIAGNOSIS — Z79899 Other long term (current) drug therapy: Secondary | ICD-10-CM | POA: Diagnosis not present

## 2020-03-25 DIAGNOSIS — D61810 Antineoplastic chemotherapy induced pancytopenia: Secondary | ICD-10-CM | POA: Diagnosis not present

## 2020-03-25 DIAGNOSIS — Z86718 Personal history of other venous thrombosis and embolism: Secondary | ICD-10-CM | POA: Diagnosis not present

## 2020-03-25 DIAGNOSIS — G8929 Other chronic pain: Secondary | ICD-10-CM | POA: Diagnosis not present

## 2020-03-25 DIAGNOSIS — T451X5S Adverse effect of antineoplastic and immunosuppressive drugs, sequela: Secondary | ICD-10-CM | POA: Diagnosis not present

## 2020-03-25 DIAGNOSIS — Z9484 Stem cells transplant status: Secondary | ICD-10-CM | POA: Diagnosis not present

## 2020-03-25 DIAGNOSIS — C9000 Multiple myeloma not having achieved remission: Secondary | ICD-10-CM | POA: Diagnosis not present

## 2020-03-25 DIAGNOSIS — Z7901 Long term (current) use of anticoagulants: Secondary | ICD-10-CM | POA: Diagnosis not present

## 2020-03-25 DIAGNOSIS — G629 Polyneuropathy, unspecified: Secondary | ICD-10-CM | POA: Diagnosis not present

## 2020-03-25 DIAGNOSIS — M545 Low back pain: Secondary | ICD-10-CM | POA: Diagnosis not present

## 2020-03-26 DIAGNOSIS — T451X5S Adverse effect of antineoplastic and immunosuppressive drugs, sequela: Secondary | ICD-10-CM | POA: Diagnosis not present

## 2020-03-26 DIAGNOSIS — Z9484 Stem cells transplant status: Secondary | ICD-10-CM | POA: Diagnosis not present

## 2020-03-26 DIAGNOSIS — M545 Low back pain: Secondary | ICD-10-CM | POA: Diagnosis not present

## 2020-03-26 DIAGNOSIS — G8929 Other chronic pain: Secondary | ICD-10-CM | POA: Diagnosis not present

## 2020-03-26 DIAGNOSIS — C9000 Multiple myeloma not having achieved remission: Secondary | ICD-10-CM | POA: Diagnosis not present

## 2020-03-26 DIAGNOSIS — D61810 Antineoplastic chemotherapy induced pancytopenia: Secondary | ICD-10-CM | POA: Diagnosis not present

## 2020-03-27 DIAGNOSIS — C9000 Multiple myeloma not having achieved remission: Secondary | ICD-10-CM | POA: Diagnosis not present

## 2020-03-27 DIAGNOSIS — R110 Nausea: Secondary | ICD-10-CM | POA: Diagnosis not present

## 2020-03-27 DIAGNOSIS — D61810 Antineoplastic chemotherapy induced pancytopenia: Secondary | ICD-10-CM | POA: Diagnosis not present

## 2020-03-27 DIAGNOSIS — G629 Polyneuropathy, unspecified: Secondary | ICD-10-CM | POA: Diagnosis not present

## 2020-03-27 DIAGNOSIS — G479 Sleep disorder, unspecified: Secondary | ICD-10-CM | POA: Diagnosis not present

## 2020-03-27 DIAGNOSIS — I82412 Acute embolism and thrombosis of left femoral vein: Secondary | ICD-10-CM | POA: Diagnosis not present

## 2020-03-27 DIAGNOSIS — T451X5S Adverse effect of antineoplastic and immunosuppressive drugs, sequela: Secondary | ICD-10-CM | POA: Diagnosis not present

## 2020-03-27 DIAGNOSIS — Z7901 Long term (current) use of anticoagulants: Secondary | ICD-10-CM | POA: Diagnosis not present

## 2020-03-27 DIAGNOSIS — Z9484 Stem cells transplant status: Secondary | ICD-10-CM | POA: Diagnosis not present

## 2020-03-27 DIAGNOSIS — Z79899 Other long term (current) drug therapy: Secondary | ICD-10-CM | POA: Diagnosis not present

## 2020-03-28 DIAGNOSIS — G629 Polyneuropathy, unspecified: Secondary | ICD-10-CM | POA: Diagnosis not present

## 2020-03-28 DIAGNOSIS — C9000 Multiple myeloma not having achieved remission: Secondary | ICD-10-CM | POA: Diagnosis not present

## 2020-03-28 DIAGNOSIS — Z79899 Other long term (current) drug therapy: Secondary | ICD-10-CM | POA: Diagnosis not present

## 2020-03-28 DIAGNOSIS — T451X5S Adverse effect of antineoplastic and immunosuppressive drugs, sequela: Secondary | ICD-10-CM | POA: Diagnosis not present

## 2020-03-28 DIAGNOSIS — D61810 Antineoplastic chemotherapy induced pancytopenia: Secondary | ICD-10-CM | POA: Diagnosis not present

## 2020-03-28 DIAGNOSIS — Z9484 Stem cells transplant status: Secondary | ICD-10-CM | POA: Diagnosis not present

## 2020-03-28 DIAGNOSIS — Z7901 Long term (current) use of anticoagulants: Secondary | ICD-10-CM | POA: Diagnosis not present

## 2020-03-28 DIAGNOSIS — Z52011 Autologous donor, stem cells: Secondary | ICD-10-CM | POA: Diagnosis not present

## 2020-03-28 DIAGNOSIS — I82412 Acute embolism and thrombosis of left femoral vein: Secondary | ICD-10-CM | POA: Diagnosis not present

## 2020-03-29 DIAGNOSIS — I129 Hypertensive chronic kidney disease with stage 1 through stage 4 chronic kidney disease, or unspecified chronic kidney disease: Secondary | ICD-10-CM | POA: Diagnosis not present

## 2020-03-29 DIAGNOSIS — G5790 Unspecified mononeuropathy of unspecified lower limb: Secondary | ICD-10-CM | POA: Diagnosis not present

## 2020-03-29 DIAGNOSIS — R110 Nausea: Secondary | ICD-10-CM | POA: Diagnosis not present

## 2020-03-29 DIAGNOSIS — Z9484 Stem cells transplant status: Secondary | ICD-10-CM | POA: Diagnosis not present

## 2020-03-29 DIAGNOSIS — R5081 Fever presenting with conditions classified elsewhere: Secondary | ICD-10-CM | POA: Diagnosis not present

## 2020-03-29 DIAGNOSIS — D708 Other neutropenia: Secondary | ICD-10-CM | POA: Diagnosis not present

## 2020-03-29 DIAGNOSIS — R197 Diarrhea, unspecified: Secondary | ICD-10-CM | POA: Diagnosis not present

## 2020-03-29 DIAGNOSIS — M109 Gout, unspecified: Secondary | ICD-10-CM | POA: Diagnosis not present

## 2020-03-29 DIAGNOSIS — I82412 Acute embolism and thrombosis of left femoral vein: Secondary | ICD-10-CM | POA: Diagnosis not present

## 2020-03-29 DIAGNOSIS — R627 Adult failure to thrive: Secondary | ICD-10-CM | POA: Diagnosis not present

## 2020-03-29 DIAGNOSIS — A049 Bacterial intestinal infection, unspecified: Secondary | ICD-10-CM | POA: Diagnosis not present

## 2020-03-29 DIAGNOSIS — D709 Neutropenia, unspecified: Secondary | ICD-10-CM | POA: Diagnosis not present

## 2020-03-29 DIAGNOSIS — T865 Complications of stem cell transplant: Secondary | ICD-10-CM | POA: Diagnosis not present

## 2020-03-29 DIAGNOSIS — G629 Polyneuropathy, unspecified: Secondary | ICD-10-CM | POA: Diagnosis not present

## 2020-03-29 DIAGNOSIS — Z7901 Long term (current) use of anticoagulants: Secondary | ICD-10-CM | POA: Diagnosis not present

## 2020-03-29 DIAGNOSIS — Z86718 Personal history of other venous thrombosis and embolism: Secondary | ICD-10-CM | POA: Diagnosis not present

## 2020-03-29 DIAGNOSIS — T451X5A Adverse effect of antineoplastic and immunosuppressive drugs, initial encounter: Secondary | ICD-10-CM | POA: Diagnosis not present

## 2020-03-29 DIAGNOSIS — G4731 Primary central sleep apnea: Secondary | ICD-10-CM | POA: Diagnosis not present

## 2020-03-29 DIAGNOSIS — R509 Fever, unspecified: Secondary | ICD-10-CM | POA: Diagnosis not present

## 2020-03-29 DIAGNOSIS — C9000 Multiple myeloma not having achieved remission: Secondary | ICD-10-CM | POA: Diagnosis not present

## 2020-03-29 DIAGNOSIS — K5900 Constipation, unspecified: Secondary | ICD-10-CM | POA: Diagnosis not present

## 2020-03-29 DIAGNOSIS — R109 Unspecified abdominal pain: Secondary | ICD-10-CM | POA: Diagnosis not present

## 2020-03-29 DIAGNOSIS — N183 Chronic kidney disease, stage 3 unspecified: Secondary | ICD-10-CM | POA: Diagnosis not present

## 2020-03-29 DIAGNOSIS — Z809 Family history of malignant neoplasm, unspecified: Secondary | ICD-10-CM | POA: Diagnosis not present

## 2020-03-29 DIAGNOSIS — Z79899 Other long term (current) drug therapy: Secondary | ICD-10-CM | POA: Diagnosis not present

## 2020-03-29 DIAGNOSIS — D61810 Antineoplastic chemotherapy induced pancytopenia: Secondary | ICD-10-CM | POA: Diagnosis not present

## 2020-03-30 ENCOUNTER — Telehealth: Payer: Self-pay | Admitting: *Deleted

## 2020-03-30 ENCOUNTER — Other Ambulatory Visit: Payer: Self-pay | Admitting: *Deleted

## 2020-03-30 DIAGNOSIS — C9000 Multiple myeloma not having achieved remission: Secondary | ICD-10-CM

## 2020-03-30 DIAGNOSIS — G629 Polyneuropathy, unspecified: Secondary | ICD-10-CM | POA: Diagnosis not present

## 2020-03-30 NOTE — Telephone Encounter (Signed)
Received fax from [facility] transplant team member Berdine Addison, RN. [phone].  Request for Transplant 60+ day appts for lab and for patient to see Dr. Irene Limbo week of 05/22/20. Schedule message sent. Requested labs ordered: CBC/diff;CMP;Magnesium; Platelets. Nicole requests CB when appts are scheduled.

## 2020-04-01 DIAGNOSIS — C9000 Multiple myeloma not having achieved remission: Secondary | ICD-10-CM | POA: Diagnosis not present

## 2020-04-01 DIAGNOSIS — R509 Fever, unspecified: Secondary | ICD-10-CM | POA: Diagnosis not present

## 2020-04-01 DIAGNOSIS — R5081 Fever presenting with conditions classified elsewhere: Secondary | ICD-10-CM | POA: Diagnosis not present

## 2020-04-01 DIAGNOSIS — R627 Adult failure to thrive: Secondary | ICD-10-CM | POA: Diagnosis not present

## 2020-04-01 DIAGNOSIS — D709 Neutropenia, unspecified: Secondary | ICD-10-CM | POA: Diagnosis not present

## 2020-04-02 DIAGNOSIS — R627 Adult failure to thrive: Secondary | ICD-10-CM | POA: Diagnosis not present

## 2020-04-02 DIAGNOSIS — R5081 Fever presenting with conditions classified elsewhere: Secondary | ICD-10-CM | POA: Diagnosis not present

## 2020-04-02 DIAGNOSIS — D709 Neutropenia, unspecified: Secondary | ICD-10-CM | POA: Diagnosis not present

## 2020-04-02 DIAGNOSIS — C9000 Multiple myeloma not having achieved remission: Secondary | ICD-10-CM | POA: Diagnosis not present

## 2020-04-03 DIAGNOSIS — R197 Diarrhea, unspecified: Secondary | ICD-10-CM | POA: Diagnosis not present

## 2020-04-03 DIAGNOSIS — R109 Unspecified abdominal pain: Secondary | ICD-10-CM | POA: Diagnosis not present

## 2020-04-03 DIAGNOSIS — R5081 Fever presenting with conditions classified elsewhere: Secondary | ICD-10-CM | POA: Diagnosis not present

## 2020-04-03 DIAGNOSIS — D709 Neutropenia, unspecified: Secondary | ICD-10-CM | POA: Diagnosis not present

## 2020-04-03 DIAGNOSIS — C9000 Multiple myeloma not having achieved remission: Secondary | ICD-10-CM | POA: Diagnosis not present

## 2020-04-03 DIAGNOSIS — R627 Adult failure to thrive: Secondary | ICD-10-CM | POA: Diagnosis not present

## 2020-04-04 DIAGNOSIS — D709 Neutropenia, unspecified: Secondary | ICD-10-CM | POA: Diagnosis not present

## 2020-04-04 DIAGNOSIS — R197 Diarrhea, unspecified: Secondary | ICD-10-CM | POA: Diagnosis not present

## 2020-04-04 DIAGNOSIS — R627 Adult failure to thrive: Secondary | ICD-10-CM | POA: Diagnosis not present

## 2020-04-04 DIAGNOSIS — C9000 Multiple myeloma not having achieved remission: Secondary | ICD-10-CM | POA: Diagnosis not present

## 2020-04-04 DIAGNOSIS — R5081 Fever presenting with conditions classified elsewhere: Secondary | ICD-10-CM | POA: Diagnosis not present

## 2020-04-05 DIAGNOSIS — R627 Adult failure to thrive: Secondary | ICD-10-CM | POA: Diagnosis not present

## 2020-04-05 DIAGNOSIS — D709 Neutropenia, unspecified: Secondary | ICD-10-CM | POA: Diagnosis not present

## 2020-04-05 DIAGNOSIS — R5081 Fever presenting with conditions classified elsewhere: Secondary | ICD-10-CM | POA: Diagnosis not present

## 2020-04-05 DIAGNOSIS — C9000 Multiple myeloma not having achieved remission: Secondary | ICD-10-CM | POA: Diagnosis not present

## 2020-04-05 DIAGNOSIS — R197 Diarrhea, unspecified: Secondary | ICD-10-CM | POA: Diagnosis not present

## 2020-04-10 ENCOUNTER — Telehealth: Payer: Self-pay | Admitting: *Deleted

## 2020-04-10 NOTE — Telephone Encounter (Signed)
Patient called. Has concerns regarding sharp pain in right shoulder and upper arm - has had since last week. Was discharged from transplant on Wednesday. Not sure if Dr Irene Limbo is managing pain or other symptoms. Dr. Irene Limbo informed. Contacted patient per Dr. Irene Limbo: please contact transplant team during first 100 days after transplant with any/all physical symptoms. They need to know and will be man ageing symptoms.  Patient verbalized understanding and states he will call them.

## 2020-04-11 ENCOUNTER — Inpatient Hospital Stay: Payer: Medicare Other | Attending: Hematology

## 2020-04-11 ENCOUNTER — Other Ambulatory Visit: Payer: Self-pay

## 2020-04-11 DIAGNOSIS — C9000 Multiple myeloma not having achieved remission: Secondary | ICD-10-CM | POA: Diagnosis not present

## 2020-04-11 DIAGNOSIS — C7951 Secondary malignant neoplasm of bone: Secondary | ICD-10-CM

## 2020-04-11 LAB — CBC WITH DIFFERENTIAL/PLATELET
Abs Immature Granulocytes: 0.18 10*3/uL — ABNORMAL HIGH (ref 0.00–0.07)
Basophils Absolute: 0.1 10*3/uL (ref 0.0–0.1)
Basophils Relative: 1 %
Eosinophils Absolute: 0 10*3/uL (ref 0.0–0.5)
Eosinophils Relative: 0 %
HCT: 34.7 % — ABNORMAL LOW (ref 39.0–52.0)
Hemoglobin: 11.6 g/dL — ABNORMAL LOW (ref 13.0–17.0)
Immature Granulocytes: 2 %
Lymphocytes Relative: 35 %
Lymphs Abs: 2.7 10*3/uL (ref 0.7–4.0)
MCH: 34.1 pg — ABNORMAL HIGH (ref 26.0–34.0)
MCHC: 33.4 g/dL (ref 30.0–36.0)
MCV: 102.1 fL — ABNORMAL HIGH (ref 80.0–100.0)
Monocytes Absolute: 1.7 10*3/uL — ABNORMAL HIGH (ref 0.1–1.0)
Monocytes Relative: 22 %
Neutro Abs: 3.1 10*3/uL (ref 1.7–7.7)
Neutrophils Relative %: 40 %
Platelets: 224 10*3/uL (ref 150–400)
RBC: 3.4 MIL/uL — ABNORMAL LOW (ref 4.22–5.81)
RDW: 14.9 % (ref 11.5–15.5)
WBC: 7.7 10*3/uL (ref 4.0–10.5)
nRBC: 0 % (ref 0.0–0.2)

## 2020-04-11 LAB — CMP (CANCER CENTER ONLY)
ALT: 19 U/L (ref 0–44)
AST: 28 U/L (ref 15–41)
Albumin: 3.2 g/dL — ABNORMAL LOW (ref 3.5–5.0)
Alkaline Phosphatase: 66 U/L (ref 38–126)
Anion gap: 9 (ref 5–15)
BUN: 6 mg/dL — ABNORMAL LOW (ref 8–23)
CO2: 28 mmol/L (ref 22–32)
Calcium: 9 mg/dL (ref 8.9–10.3)
Chloride: 102 mmol/L (ref 98–111)
Creatinine: 0.91 mg/dL (ref 0.61–1.24)
GFR, Est AFR Am: >60 mL/min (ref >60)
GFR, Estimated: >60 mL/min (ref >60)
Glucose, Bld: 99 mg/dL (ref 70–99)
Potassium: 3.7 mmol/L (ref 3.5–5.1)
Sodium: 139 mmol/L (ref 135–145)
Total Bilirubin: 0.4 mg/dL (ref 0.3–1.2)
Total Protein: 5.9 g/dL — ABNORMAL LOW (ref 6.5–8.1)

## 2020-04-18 DIAGNOSIS — C9000 Multiple myeloma not having achieved remission: Secondary | ICD-10-CM | POA: Diagnosis not present

## 2020-04-18 DIAGNOSIS — I82442 Acute embolism and thrombosis of left tibial vein: Secondary | ICD-10-CM | POA: Diagnosis not present

## 2020-04-18 DIAGNOSIS — I82412 Acute embolism and thrombosis of left femoral vein: Secondary | ICD-10-CM | POA: Diagnosis not present

## 2020-04-18 DIAGNOSIS — Z9221 Personal history of antineoplastic chemotherapy: Secondary | ICD-10-CM | POA: Diagnosis not present

## 2020-04-18 DIAGNOSIS — I82432 Acute embolism and thrombosis of left popliteal vein: Secondary | ICD-10-CM | POA: Diagnosis not present

## 2020-04-18 DIAGNOSIS — Z9484 Stem cells transplant status: Secondary | ICD-10-CM | POA: Diagnosis not present

## 2020-04-18 DIAGNOSIS — I824Z2 Acute embolism and thrombosis of unspecified deep veins of left distal lower extremity: Secondary | ICD-10-CM | POA: Diagnosis not present

## 2020-04-18 DIAGNOSIS — Z7901 Long term (current) use of anticoagulants: Secondary | ICD-10-CM | POA: Diagnosis not present

## 2020-04-18 DIAGNOSIS — Z79899 Other long term (current) drug therapy: Secondary | ICD-10-CM | POA: Diagnosis not present

## 2020-05-19 ENCOUNTER — Other Ambulatory Visit: Payer: Self-pay | Admitting: Hematology

## 2020-05-19 NOTE — Telephone Encounter (Signed)
Please review for refill.  

## 2020-05-22 ENCOUNTER — Inpatient Hospital Stay: Payer: Medicare Other

## 2020-05-22 ENCOUNTER — Other Ambulatory Visit: Payer: Self-pay

## 2020-05-22 ENCOUNTER — Inpatient Hospital Stay: Payer: Medicare Other | Attending: Hematology | Admitting: Hematology

## 2020-05-22 VITALS — BP 120/72 | HR 101 | Temp 98.0°F | Resp 18 | Ht 71.0 in | Wt 196.9 lb

## 2020-05-22 DIAGNOSIS — Z86718 Personal history of other venous thrombosis and embolism: Secondary | ICD-10-CM | POA: Diagnosis not present

## 2020-05-22 DIAGNOSIS — Z95828 Presence of other vascular implants and grafts: Secondary | ICD-10-CM

## 2020-05-22 DIAGNOSIS — Z9481 Bone marrow transplant status: Secondary | ICD-10-CM | POA: Diagnosis not present

## 2020-05-22 DIAGNOSIS — N189 Chronic kidney disease, unspecified: Secondary | ICD-10-CM | POA: Diagnosis not present

## 2020-05-22 DIAGNOSIS — C9000 Multiple myeloma not having achieved remission: Secondary | ICD-10-CM | POA: Insufficient documentation

## 2020-05-22 DIAGNOSIS — I129 Hypertensive chronic kidney disease with stage 1 through stage 4 chronic kidney disease, or unspecified chronic kidney disease: Secondary | ICD-10-CM | POA: Insufficient documentation

## 2020-05-22 LAB — CMP (CANCER CENTER ONLY)
ALT: 13 U/L (ref 0–44)
AST: 25 U/L (ref 15–41)
Albumin: 3.9 g/dL (ref 3.5–5.0)
Alkaline Phosphatase: 51 U/L (ref 38–126)
Anion gap: 7 (ref 5–15)
BUN: 15 mg/dL (ref 8–23)
CO2: 24 mmol/L (ref 22–32)
Calcium: 9.4 mg/dL (ref 8.9–10.3)
Chloride: 106 mmol/L (ref 98–111)
Creatinine: 1.44 mg/dL — ABNORMAL HIGH (ref 0.61–1.24)
GFR, Estimated: 51 mL/min — ABNORMAL LOW (ref >60)
Glucose, Bld: 91 mg/dL (ref 70–99)
Potassium: 4.1 mmol/L (ref 3.5–5.1)
Sodium: 137 mmol/L (ref 135–145)
Total Bilirubin: 0.5 mg/dL (ref 0.3–1.2)
Total Protein: 6.1 g/dL — ABNORMAL LOW (ref 6.5–8.1)

## 2020-05-22 LAB — CBC WITH DIFFERENTIAL (CANCER CENTER ONLY)
Abs Immature Granulocytes: 0.01 10*3/uL (ref 0.00–0.07)
Basophils Absolute: 0 10*3/uL (ref 0.0–0.1)
Basophils Relative: 1 %
Eosinophils Absolute: 0.1 10*3/uL (ref 0.0–0.5)
Eosinophils Relative: 1 %
HCT: 34.8 % — ABNORMAL LOW (ref 39.0–52.0)
Hemoglobin: 11.7 g/dL — ABNORMAL LOW (ref 13.0–17.0)
Immature Granulocytes: 0 %
Lymphocytes Relative: 43 %
Lymphs Abs: 2.7 10*3/uL (ref 0.7–4.0)
MCH: 34.9 pg — ABNORMAL HIGH (ref 26.0–34.0)
MCHC: 33.6 g/dL (ref 30.0–36.0)
MCV: 103.9 fL — ABNORMAL HIGH (ref 80.0–100.0)
Monocytes Absolute: 0.6 10*3/uL (ref 0.1–1.0)
Monocytes Relative: 10 %
Neutro Abs: 2.9 10*3/uL (ref 1.7–7.7)
Neutrophils Relative %: 45 %
Platelet Count: 156 10*3/uL (ref 150–400)
RBC: 3.35 MIL/uL — ABNORMAL LOW (ref 4.22–5.81)
RDW: 14.5 % (ref 11.5–15.5)
WBC Count: 6.3 10*3/uL (ref 4.0–10.5)
nRBC: 0 % (ref 0.0–0.2)

## 2020-05-22 LAB — MAGNESIUM: Magnesium: 1.8 mg/dL (ref 1.7–2.4)

## 2020-05-22 MED ORDER — SODIUM CHLORIDE 0.9% FLUSH
10.0000 mL | Freq: Once | INTRAVENOUS | Status: AC
  Administered 2020-05-22: 10 mL
  Filled 2020-05-22: qty 10

## 2020-05-22 MED ORDER — HEPARIN SOD (PORK) LOCK FLUSH 100 UNIT/ML IV SOLN
250.0000 [IU] | Freq: Once | INTRAVENOUS | Status: AC
  Administered 2020-05-22: 500 [IU]
  Filled 2020-05-22: qty 5
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
## 2020-05-22 NOTE — Patient Instructions (Signed)

## 2020-05-26 ENCOUNTER — Telehealth: Payer: Self-pay | Admitting: Internal Medicine

## 2020-05-26 NOTE — Telephone Encounter (Signed)
Called and spoke with [name] to schedule his CPE, AMW with labs prior, we scheduled those. Then he called me back to let me know that he had spoke with [name] and that he had his Stem Cell Transplant in September and was under his 100 day quarantine and was not cleared to come into the office until after the first of the year. That he would call and schedule his physical as soon as he was cleared. So I worked out where we could do his AMW by phone this year and do CPE and labs once he is cleared to come in office.

## 2020-06-26 ENCOUNTER — Ambulatory Visit: Payer: Medicare Other | Admitting: Hematology

## 2020-06-26 ENCOUNTER — Other Ambulatory Visit: Payer: Medicare Other

## 2020-06-28 ENCOUNTER — Ambulatory Visit (INDEPENDENT_AMBULATORY_CARE_PROVIDER_SITE_OTHER): Payer: Medicare Other | Admitting: Internal Medicine

## 2020-06-28 ENCOUNTER — Encounter: Payer: Self-pay | Admitting: Internal Medicine

## 2020-06-28 ENCOUNTER — Other Ambulatory Visit: Payer: Self-pay

## 2020-06-28 DIAGNOSIS — Z9484 Stem cells transplant status: Secondary | ICD-10-CM

## 2020-06-28 DIAGNOSIS — Z0000 Encounter for general adult medical examination without abnormal findings: Secondary | ICD-10-CM | POA: Diagnosis not present

## 2020-06-28 DIAGNOSIS — Z8579 Personal history of other malignant neoplasms of lymphoid, hematopoietic and related tissues: Secondary | ICD-10-CM | POA: Diagnosis not present

## 2020-06-28 NOTE — Patient Instructions (Signed)
He is being followed closely at [facility].  Return in 1 year or as needed.
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
## 2020-07-02 ENCOUNTER — Other Ambulatory Visit: Payer: Self-pay | Admitting: Hematology

## 2020-07-03 NOTE — Telephone Encounter (Signed)
Please review for refill.  

## 2020-07-04 DIAGNOSIS — C9000 Multiple myeloma not having achieved remission: Secondary | ICD-10-CM | POA: Diagnosis not present

## 2020-07-04 DIAGNOSIS — Z9484 Stem cells transplant status: Secondary | ICD-10-CM | POA: Diagnosis not present

## 2020-07-11 DIAGNOSIS — I440 Atrioventricular block, first degree: Secondary | ICD-10-CM | POA: Diagnosis not present

## 2020-07-11 DIAGNOSIS — C9001 Multiple myeloma in remission: Secondary | ICD-10-CM | POA: Diagnosis not present

## 2020-07-11 DIAGNOSIS — G893 Neoplasm related pain (acute) (chronic): Secondary | ICD-10-CM | POA: Diagnosis not present

## 2020-07-11 DIAGNOSIS — Z86718 Personal history of other venous thrombosis and embolism: Secondary | ICD-10-CM | POA: Diagnosis not present

## 2020-07-11 DIAGNOSIS — C9000 Multiple myeloma not having achieved remission: Secondary | ICD-10-CM | POA: Diagnosis not present

## 2020-07-11 DIAGNOSIS — I129 Hypertensive chronic kidney disease with stage 1 through stage 4 chronic kidney disease, or unspecified chronic kidney disease: Secondary | ICD-10-CM | POA: Diagnosis not present

## 2020-07-11 DIAGNOSIS — G4700 Insomnia, unspecified: Secondary | ICD-10-CM | POA: Diagnosis not present

## 2020-07-11 DIAGNOSIS — R0600 Dyspnea, unspecified: Secondary | ICD-10-CM | POA: Diagnosis not present

## 2020-07-11 DIAGNOSIS — I499 Cardiac arrhythmia, unspecified: Secondary | ICD-10-CM | POA: Diagnosis not present

## 2020-07-11 DIAGNOSIS — Z7901 Long term (current) use of anticoagulants: Secondary | ICD-10-CM | POA: Diagnosis not present

## 2020-07-11 DIAGNOSIS — N183 Chronic kidney disease, stage 3 unspecified: Secondary | ICD-10-CM | POA: Diagnosis not present

## 2020-07-11 DIAGNOSIS — Z9484 Stem cells transplant status: Secondary | ICD-10-CM | POA: Diagnosis not present

## 2020-07-11 DIAGNOSIS — R0609 Other forms of dyspnea: Secondary | ICD-10-CM | POA: Diagnosis not present

## 2020-07-11 DIAGNOSIS — I82812 Embolism and thrombosis of superficial veins of left lower extremities: Secondary | ICD-10-CM | POA: Diagnosis not present

## 2020-07-13 ENCOUNTER — Telehealth: Payer: Self-pay | Admitting: Hematology

## 2020-07-13 ENCOUNTER — Other Ambulatory Visit: Payer: Self-pay | Admitting: Hematology

## 2020-07-13 DIAGNOSIS — C9000 Multiple myeloma not having achieved remission: Secondary | ICD-10-CM

## 2020-07-13 NOTE — Telephone Encounter (Signed)
Scheduled appt per 12/23 sch msg - pt is aware of appt date and time

## 2020-07-20 ENCOUNTER — Telehealth: Payer: Self-pay | Admitting: Hematology

## 2020-07-20 NOTE — Telephone Encounter (Signed)
Called patient regarding upcoming appointments, patient is notified. 

## 2020-07-23 ENCOUNTER — Encounter: Payer: Self-pay | Admitting: Hematology

## 2020-08-01 ENCOUNTER — Inpatient Hospital Stay: Payer: Medicare Other

## 2020-08-01 ENCOUNTER — Other Ambulatory Visit: Payer: Self-pay

## 2020-08-01 ENCOUNTER — Encounter: Payer: Self-pay | Admitting: Hematology

## 2020-08-01 ENCOUNTER — Inpatient Hospital Stay: Payer: Medicare Other | Attending: Hematology | Admitting: Hematology

## 2020-08-01 VITALS — BP 109/78 | HR 74 | Temp 97.9°F | Resp 18 | Ht 71.0 in | Wt 196.5 lb

## 2020-08-01 DIAGNOSIS — Z79899 Other long term (current) drug therapy: Secondary | ICD-10-CM | POA: Insufficient documentation

## 2020-08-01 DIAGNOSIS — C9000 Multiple myeloma not having achieved remission: Secondary | ICD-10-CM | POA: Insufficient documentation

## 2020-08-01 DIAGNOSIS — Z23 Encounter for immunization: Secondary | ICD-10-CM | POA: Diagnosis not present

## 2020-08-01 DIAGNOSIS — Z5112 Encounter for antineoplastic immunotherapy: Secondary | ICD-10-CM | POA: Insufficient documentation

## 2020-08-01 DIAGNOSIS — Z95828 Presence of other vascular implants and grafts: Secondary | ICD-10-CM

## 2020-08-01 LAB — CMP (CANCER CENTER ONLY)
ALT: 14 U/L (ref 0–44)
AST: 22 U/L (ref 15–41)
Albumin: 3.8 g/dL (ref 3.5–5.0)
Alkaline Phosphatase: 63 U/L (ref 38–126)
Anion gap: 6 (ref 5–15)
BUN: 13 mg/dL (ref 8–23)
CO2: 27 mmol/L (ref 22–32)
Calcium: 10.1 mg/dL (ref 8.9–10.3)
Chloride: 105 mmol/L (ref 98–111)
Creatinine: 1.46 mg/dL — ABNORMAL HIGH (ref 0.61–1.24)
GFR, Estimated: 50 mL/min — ABNORMAL LOW (ref >60)
Glucose, Bld: 99 mg/dL (ref 70–99)
Potassium: 4.5 mmol/L (ref 3.5–5.1)
Sodium: 138 mmol/L (ref 135–145)
Total Bilirubin: 0.5 mg/dL (ref 0.3–1.2)
Total Protein: 6.1 g/dL — ABNORMAL LOW (ref 6.5–8.1)

## 2020-08-01 LAB — CBC WITH DIFFERENTIAL/PLATELET
Abs Immature Granulocytes: 0.01 10*3/uL (ref 0.00–0.07)
Basophils Absolute: 0 10*3/uL (ref 0.0–0.1)
Basophils Relative: 1 %
Eosinophils Absolute: 0.1 10*3/uL (ref 0.0–0.5)
Eosinophils Relative: 2 %
HCT: 36.8 % — ABNORMAL LOW (ref 39.0–52.0)
Hemoglobin: 12.6 g/dL — ABNORMAL LOW (ref 13.0–17.0)
Immature Granulocytes: 0 %
Lymphocytes Relative: 39 %
Lymphs Abs: 1.7 10*3/uL (ref 0.7–4.0)
MCH: 33.3 pg (ref 26.0–34.0)
MCHC: 34.2 g/dL (ref 30.0–36.0)
MCV: 97.4 fL (ref 80.0–100.0)
Monocytes Absolute: 0.5 10*3/uL (ref 0.1–1.0)
Monocytes Relative: 12 %
Neutro Abs: 2 10*3/uL (ref 1.7–7.7)
Neutrophils Relative %: 46 %
Platelets: 143 10*3/uL — ABNORMAL LOW (ref 150–400)
RBC: 3.78 MIL/uL — ABNORMAL LOW (ref 4.22–5.81)
RDW: 13.3 % (ref 11.5–15.5)
WBC: 4.4 10*3/uL (ref 4.0–10.5)
nRBC: 0 % (ref 0.0–0.2)

## 2020-08-01 LAB — SAMPLE TO BLOOD BANK

## 2020-08-01 MED ORDER — SODIUM CHLORIDE 0.9% FLUSH
10.0000 mL | Freq: Once | INTRAVENOUS | Status: AC
  Administered 2020-08-01: 10 mL
  Filled 2020-08-01: qty 10

## 2020-08-01 MED ORDER — HEPARIN SOD (PORK) LOCK FLUSH 100 UNIT/ML IV SOLN
500.0000 [IU] | Freq: Once | INTRAVENOUS | Status: AC
  Administered 2020-08-01: 500 [IU]
  Filled 2020-08-01: qty 5
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
## 2020-08-01 NOTE — Progress Notes (Signed)
   [id number] Vaccination Clinic  Name:  [name].    MRN: [id number] DOB: 1945-08-21  08/01/2020  [name] was observed post Covid-19 immunization for 15 minutes without incident. He was provided with Vaccine Information Sheet and instruction to access the V-Safe system.   [name] was instructed to call 911 with any severe reactions post vaccine: Marland Kitchen Difficulty breathing  . Swelling of face and throat  . A fast heartbeat  . A bad rash all over body  . Dizziness and weakness   Immunizations Administered    Name Date Dose VIS Date Route   Pfizer COVID-19 Vaccine 08/01/2020  1:11 PM 0.3 mL 05/10/2020 Intramuscular   Manufacturer: Irena   Lot: [id number]   NDC: [address]-8

## 2020-08-02 LAB — KAPPA/LAMBDA LIGHT CHAINS
Kappa free light chain: 11.8 mg/L (ref 3.3–19.4)
Kappa, lambda light chain ratio: 1.84 — ABNORMAL HIGH (ref 0.26–1.65)
Lambda free light chains: 6.4 mg/L (ref 5.7–26.3)

## 2020-08-03 ENCOUNTER — Other Ambulatory Visit: Payer: Self-pay | Admitting: Hematology

## 2020-08-03 ENCOUNTER — Telehealth: Payer: Self-pay | Admitting: Hematology

## 2020-08-03 DIAGNOSIS — I82419 Acute embolism and thrombosis of unspecified femoral vein: Secondary | ICD-10-CM

## 2020-08-03 DIAGNOSIS — C9000 Multiple myeloma not having achieved remission: Secondary | ICD-10-CM

## 2020-08-03 LAB — MULTIPLE MYELOMA PANEL, SERUM
Albumin SerPl Elph-Mcnc: 3.9 g/dL (ref 2.9–4.4)
Albumin/Glob SerPl: 2.1 — ABNORMAL HIGH (ref 0.7–1.7)
Alpha 1: 0.2 g/dL (ref 0.0–0.4)
Alpha2 Glob SerPl Elph-Mcnc: 0.7 g/dL (ref 0.4–1.0)
B-Globulin SerPl Elph-Mcnc: 0.8 g/dL (ref 0.7–1.3)
Gamma Glob SerPl Elph-Mcnc: 0.2 g/dL — ABNORMAL LOW (ref 0.4–1.8)
Globulin, Total: 1.9 g/dL — ABNORMAL LOW (ref 2.2–3.9)
IgA: 27 mg/dL — ABNORMAL LOW (ref 61–437)
IgG (Immunoglobin G), Serum: 303 mg/dL — ABNORMAL LOW (ref 603–1613)
IgM (Immunoglobulin M), Srm: 11 mg/dL — ABNORMAL LOW (ref 15–143)
M Protein SerPl Elph-Mcnc: 0.1 g/dL — ABNORMAL HIGH
Total Protein ELP: 5.8 g/dL — ABNORMAL LOW (ref 6.0–8.5)

## 2020-08-03 MED ORDER — APIXABAN 5 MG PO TABS: ORAL_TABLET | ORAL | 2 refills | Status: DC

## 2020-08-03 MED ORDER — GABAPENTIN 100 MG PO CAPS: ORAL_CAPSULE | ORAL | 2 refills | Status: DC

## 2020-08-03 NOTE — Telephone Encounter (Signed)
Called patient regarding upcoming appointments, left a voicemail. 

## 2020-08-04 ENCOUNTER — Other Ambulatory Visit: Payer: Self-pay | Admitting: Hematology

## 2020-08-04 DIAGNOSIS — C9000 Multiple myeloma not having achieved remission: Secondary | ICD-10-CM

## 2020-08-04 DIAGNOSIS — Z7189 Other specified counseling: Secondary | ICD-10-CM

## 2020-08-04 MED ORDER — PROCHLORPERAZINE MALEATE 10 MG PO TABS: 10.0000 mg | ORAL_TABLET | Freq: Four times a day (QID) | ORAL | 1 refills | Status: DC | PRN

## 2020-08-04 MED ORDER — LORAZEPAM 0.5 MG PO TABS: 0.5000 mg | ORAL_TABLET | Freq: Four times a day (QID) | ORAL | 0 refills | Status: DC | PRN

## 2020-08-04 MED ORDER — ONDANSETRON HCL 8 MG PO TABS: 8.0000 mg | ORAL_TABLET | Freq: Two times a day (BID) | ORAL | 1 refills | Status: DC | PRN

## 2020-08-04 NOTE — Progress Notes (Signed)
ON PATHWAY REGIMEN - Multiple Myeloma and Other Plasma Cell Dyscrasias  No Change  Continue With Treatment as Ordered.  Original Decision Date/Time: 11/05/2019 13:28     A cycle is every 28 days:     Carfilzomib      Carfilzomib      Carfilzomib      Dexamethasone      Dexamethasone      Lenalidomide   **Always confirm dose/schedule in your pharmacy ordering system**  Patient Characteristics: Multiple Myeloma, Newly Diagnosed, Transplant Eligible, Standard Risk Disease Classification: Multiple Myeloma R-ISS Staging: Unknown Therapeutic Status: Newly Diagnosed Is Patient Eligible for Transplant<= Transplant Eligible Risk Status: Standard Risk Intent of Therapy: Non-Curative / Palliative Intent, Discussed with Patient

## 2020-08-04 NOTE — Progress Notes (Signed)
DISCONTINUE ON PATHWAY REGIMEN - Multiple Myeloma and Other Plasma Cell Dyscrasias     A cycle is every 28 days:     Carfilzomib      Carfilzomib      Carfilzomib      Dexamethasone      Dexamethasone      Lenalidomide   **Always confirm dose/schedule in your pharmacy ordering system**  REASON: Continuation Of Treatment PRIOR TREATMENT: [id number]: KRd (Carfilzomib 20/36 mg/m2 + Lenalidomide 25 mg + Dexamethasone PO 40/20 mg) q28 Days x 4-8 Cycles TREATMENT RESPONSE: Complete Response (CR)  START OFF PATHWAY REGIMEN - Multiple Myeloma and Other Plasma Cell Dyscrasias   [id number]:Carfilzomib 20/27 mg/m2 Monotherapy q28 Days:   A cycle is every 28 days:     Carfilzomib      Carfilzomib      Carfilzomib      Carfilzomib   **Always confirm dose/schedule in your pharmacy ordering system**  Patient Characteristics: Multiple Myeloma, Newly Diagnosed, Transplant Eligible, Standard Risk Disease Classification: Multiple Myeloma R-ISS Staging: Unknown Therapeutic Status: Newly Diagnosed Is Patient Eligible for Transplant<= Transplant Eligible Risk Status: Standard Risk Intent of Therapy: Non-Curative / Palliative Intent, Discussed with Patient

## 2020-08-07 ENCOUNTER — Other Ambulatory Visit: Payer: Self-pay | Admitting: Hematology

## 2020-08-08 ENCOUNTER — Inpatient Hospital Stay: Payer: Medicare Other

## 2020-08-08 ENCOUNTER — Encounter: Payer: Self-pay | Admitting: Hematology

## 2020-08-08 ENCOUNTER — Other Ambulatory Visit: Payer: Self-pay | Admitting: Hematology

## 2020-08-08 ENCOUNTER — Other Ambulatory Visit: Payer: Self-pay

## 2020-08-08 ENCOUNTER — Other Ambulatory Visit: Payer: Self-pay | Admitting: *Deleted

## 2020-08-08 VITALS — BP 114/67 | HR 91 | Temp 98.2°F | Resp 18 | Wt 195.0 lb

## 2020-08-08 DIAGNOSIS — Z95828 Presence of other vascular implants and grafts: Secondary | ICD-10-CM

## 2020-08-08 DIAGNOSIS — C9000 Multiple myeloma not having achieved remission: Secondary | ICD-10-CM

## 2020-08-08 DIAGNOSIS — Z79899 Other long term (current) drug therapy: Secondary | ICD-10-CM | POA: Diagnosis not present

## 2020-08-08 DIAGNOSIS — Z23 Encounter for immunization: Secondary | ICD-10-CM | POA: Diagnosis not present

## 2020-08-08 DIAGNOSIS — Z5112 Encounter for antineoplastic immunotherapy: Secondary | ICD-10-CM | POA: Diagnosis not present

## 2020-08-08 DIAGNOSIS — Z7189 Other specified counseling: Secondary | ICD-10-CM

## 2020-08-08 DIAGNOSIS — Z5111 Encounter for antineoplastic chemotherapy: Secondary | ICD-10-CM

## 2020-08-08 LAB — CMP (CANCER CENTER ONLY)
ALT: 15 U/L (ref 0–44)
AST: 24 U/L (ref 15–41)
Albumin: 4.1 g/dL (ref 3.5–5.0)
Alkaline Phosphatase: 63 U/L (ref 38–126)
Anion gap: 8 (ref 5–15)
BUN: 16 mg/dL (ref 8–23)
CO2: 25 mmol/L (ref 22–32)
Calcium: 9.4 mg/dL (ref 8.9–10.3)
Chloride: 106 mmol/L (ref 98–111)
Creatinine: 1.55 mg/dL — ABNORMAL HIGH (ref 0.61–1.24)
GFR, Estimated: 47 mL/min — ABNORMAL LOW (ref >60)
Glucose, Bld: 76 mg/dL (ref 70–99)
Potassium: 4.4 mmol/L (ref 3.5–5.1)
Sodium: 139 mmol/L (ref 135–145)
Total Bilirubin: 0.4 mg/dL (ref 0.3–1.2)
Total Protein: 6.4 g/dL — ABNORMAL LOW (ref 6.5–8.1)

## 2020-08-08 LAB — CBC WITH DIFFERENTIAL/PLATELET
Abs Immature Granulocytes: 0.01 10*3/uL (ref 0.00–0.07)
Basophils Absolute: 0 10*3/uL (ref 0.0–0.1)
Basophils Relative: 1 %
Eosinophils Absolute: 0.2 10*3/uL (ref 0.0–0.5)
Eosinophils Relative: 3 %
HCT: 38.5 % — ABNORMAL LOW (ref 39.0–52.0)
Hemoglobin: 13.1 g/dL (ref 13.0–17.0)
Immature Granulocytes: 0 %
Lymphocytes Relative: 41 %
Lymphs Abs: 2.4 10*3/uL (ref 0.7–4.0)
MCH: 33.9 pg (ref 26.0–34.0)
MCHC: 34 g/dL (ref 30.0–36.0)
MCV: 99.5 fL (ref 80.0–100.0)
Monocytes Absolute: 0.6 10*3/uL (ref 0.1–1.0)
Monocytes Relative: 10 %
Neutro Abs: 2.7 10*3/uL (ref 1.7–7.7)
Neutrophils Relative %: 45 %
Platelets: 164 10*3/uL (ref 150–400)
RBC: 3.87 MIL/uL — ABNORMAL LOW (ref 4.22–5.81)
RDW: 13.3 % (ref 11.5–15.5)
WBC: 5.8 10*3/uL (ref 4.0–10.5)
nRBC: 0 % (ref 0.0–0.2)

## 2020-08-08 MED ORDER — PROCHLORPERAZINE MALEATE 10 MG PO TABS
10.0000 mg | ORAL_TABLET | Freq: Once | ORAL | Status: AC
Start: 2020-08-08 — End: 2020-08-08
  Administered 2020-08-08: 10 mg via ORAL

## 2020-08-08 MED ORDER — SODIUM CHLORIDE 0.9% FLUSH
10.0000 mL | Freq: Once | INTRAVENOUS | Status: DC
  Filled 2020-08-08: qty 10

## 2020-08-08 MED ORDER — SODIUM CHLORIDE 0.9% FLUSH
10.0000 mL | INTRAVENOUS | Status: DC | PRN
  Administered 2020-08-08: 10 mL
  Filled 2020-08-08: qty 10

## 2020-08-08 MED ORDER — HEPARIN SOD (PORK) LOCK FLUSH 100 UNIT/ML IV SOLN
500.0000 [IU] | Freq: Once | INTRAVENOUS | Status: AC | PRN
  Administered 2020-08-08: 500 [IU]
  Filled 2020-08-08: qty 5

## 2020-08-08 MED ORDER — SODIUM CHLORIDE 0.9 % IV SOLN
Freq: Once | INTRAVENOUS | Status: AC
  Filled 2020-08-08: qty 250

## 2020-08-08 MED ORDER — DEXTROSE 5 % IV SOLN
28.0000 mg/m2 | Freq: Once | INTRAVENOUS | Status: AC
  Administered 2020-08-08: 60 mg via INTRAVENOUS
  Filled 2020-08-08: qty 30

## 2020-08-08 MED ORDER — SODIUM CHLORIDE 0.9 % IV SOLN
20.0000 mg | Freq: Once | INTRAVENOUS | Status: AC
  Administered 2020-08-08: 20 mg via INTRAVENOUS
  Filled 2020-08-08: qty 20

## 2020-08-08 MED ORDER — PROCHLORPERAZINE MALEATE 10 MG PO TABS
ORAL_TABLET | ORAL | Status: AC
  Filled 2020-08-08: qty 1

## 2020-08-08 NOTE — Progress Notes (Signed)
MD states okay to treat with Creat 1.55

## 2020-08-08 NOTE — Progress Notes (Signed)
Verbal order from Dr. Irene Limbo - Madaline Brilliant to treat with Creatinine 1.55

## 2020-08-08 NOTE — Patient Instructions (Signed)
[facility] Discharge Instructions for Patients Receiving Chemotherapy  Today you received the following chemotherapy agents Kyproliz   To help prevent nausea and vomiting after your treatment, we encourage you to take your nausea medication as directed.  If you develop nausea and vomiting that is not controlled by your nausea medication, call the clinic.   BELOW ARE SYMPTOMS THAT SHOULD BE REPORTED IMMEDIATELY:  *FEVER GREATER THAN 100.5 F  *CHILLS WITH OR WITHOUT FEVER  NAUSEA AND VOMITING THAT IS NOT CONTROLLED WITH YOUR NAUSEA MEDICATION  *UNUSUAL SHORTNESS OF BREATH  *UNUSUAL BRUISING OR BLEEDING  TENDERNESS IN MOUTH AND THROAT WITH OR WITHOUT PRESENCE OF ULCERS  *URINARY PROBLEMS  *BOWEL PROBLEMS  UNUSUAL RASH Items with * indicate a potential emergency and should be followed up as soon as possible.  Feel free to call the clinic should you have any questions or concerns. The clinic phone number is [phone].  Please show the [place] at check-in to the Emergency Department and triage nurse.

## 2020-08-08 NOTE — Telephone Encounter (Signed)
error 

## 2020-08-08 NOTE — Telephone Encounter (Signed)
Please review for refill.  

## 2020-08-15 ENCOUNTER — Inpatient Hospital Stay: Payer: Medicare Other

## 2020-08-15 ENCOUNTER — Other Ambulatory Visit: Payer: Self-pay

## 2020-08-15 ENCOUNTER — Other Ambulatory Visit: Payer: Self-pay | Admitting: Hematology

## 2020-08-15 VITALS — BP 131/81 | HR 76 | Temp 98.8°F | Resp 18

## 2020-08-15 DIAGNOSIS — C9000 Multiple myeloma not having achieved remission: Secondary | ICD-10-CM

## 2020-08-15 DIAGNOSIS — Z7189 Other specified counseling: Secondary | ICD-10-CM

## 2020-08-15 DIAGNOSIS — Z79899 Other long term (current) drug therapy: Secondary | ICD-10-CM | POA: Diagnosis not present

## 2020-08-15 DIAGNOSIS — Z5112 Encounter for antineoplastic immunotherapy: Secondary | ICD-10-CM | POA: Diagnosis not present

## 2020-08-15 DIAGNOSIS — Z5111 Encounter for antineoplastic chemotherapy: Secondary | ICD-10-CM

## 2020-08-15 DIAGNOSIS — Z23 Encounter for immunization: Secondary | ICD-10-CM | POA: Diagnosis not present

## 2020-08-15 DIAGNOSIS — Z95828 Presence of other vascular implants and grafts: Secondary | ICD-10-CM

## 2020-08-15 LAB — CMP (CANCER CENTER ONLY)
ALT: 13 U/L (ref 0–44)
AST: 21 U/L (ref 15–41)
Albumin: 3.9 g/dL (ref 3.5–5.0)
Alkaline Phosphatase: 59 U/L (ref 38–126)
Anion gap: 8 (ref 5–15)
BUN: 14 mg/dL (ref 8–23)
CO2: 25 mmol/L (ref 22–32)
Calcium: 9.5 mg/dL (ref 8.9–10.3)
Chloride: 105 mmol/L (ref 98–111)
Creatinine: 1.54 mg/dL — ABNORMAL HIGH (ref 0.61–1.24)
GFR, Estimated: 47 mL/min — ABNORMAL LOW (ref >60)
Glucose, Bld: 92 mg/dL (ref 70–99)
Potassium: 4.5 mmol/L (ref 3.5–5.1)
Sodium: 138 mmol/L (ref 135–145)
Total Bilirubin: 0.5 mg/dL (ref 0.3–1.2)
Total Protein: 6.1 g/dL — ABNORMAL LOW (ref 6.5–8.1)

## 2020-08-15 LAB — CBC WITH DIFFERENTIAL/PLATELET
Abs Immature Granulocytes: 0 10*3/uL (ref 0.00–0.07)
Basophils Absolute: 0 10*3/uL (ref 0.0–0.1)
Basophils Relative: 0 %
Eosinophils Absolute: 0.1 10*3/uL (ref 0.0–0.5)
Eosinophils Relative: 2 %
HCT: 37.3 % — ABNORMAL LOW (ref 39.0–52.0)
Hemoglobin: 12.4 g/dL — ABNORMAL LOW (ref 13.0–17.0)
Immature Granulocytes: 0 %
Lymphocytes Relative: 38 %
Lymphs Abs: 2.2 10*3/uL (ref 0.7–4.0)
MCH: 33.2 pg (ref 26.0–34.0)
MCHC: 33.2 g/dL (ref 30.0–36.0)
MCV: 100 fL (ref 80.0–100.0)
Monocytes Absolute: 0.6 10*3/uL (ref 0.1–1.0)
Monocytes Relative: 11 %
Neutro Abs: 2.8 10*3/uL (ref 1.7–7.7)
Neutrophils Relative %: 49 %
Platelets: 142 10*3/uL — ABNORMAL LOW (ref 150–400)
RBC: 3.73 MIL/uL — ABNORMAL LOW (ref 4.22–5.81)
RDW: 13.4 % (ref 11.5–15.5)
WBC: 5.8 10*3/uL (ref 4.0–10.5)
nRBC: 0 % (ref 0.0–0.2)

## 2020-08-15 MED ORDER — PROCHLORPERAZINE MALEATE 10 MG PO TABS
ORAL_TABLET | ORAL | Status: AC
  Filled 2020-08-15: qty 1

## 2020-08-15 MED ORDER — SODIUM CHLORIDE 0.9 % IV SOLN
Freq: Once | INTRAVENOUS | Status: AC
  Filled 2020-08-15: qty 250

## 2020-08-15 MED ORDER — SODIUM CHLORIDE 0.9% FLUSH
10.0000 mL | Freq: Once | INTRAVENOUS | Status: AC
  Administered 2020-08-15: 10 mL
  Filled 2020-08-15: qty 10

## 2020-08-15 MED ORDER — HEPARIN SOD (PORK) LOCK FLUSH 100 UNIT/ML IV SOLN
500.0000 [IU] | Freq: Once | INTRAVENOUS | Status: AC | PRN
  Administered 2020-08-15: 500 [IU]
  Filled 2020-08-15: qty 5

## 2020-08-15 MED ORDER — PROCHLORPERAZINE MALEATE 10 MG PO TABS
10.0000 mg | ORAL_TABLET | Freq: Once | ORAL | Status: AC
  Administered 2020-08-15: 10 mg via ORAL

## 2020-08-15 MED ORDER — CARFILZOMIB CHEMO INJECTION 60 MG
80.0000 mg | Freq: Once | INTRAVENOUS | Status: AC
Start: 2020-08-15 — End: 2020-08-15
  Administered 2020-08-15: 80 mg via INTRAVENOUS
  Filled 2020-08-15: qty 30

## 2020-08-15 MED ORDER — SODIUM CHLORIDE 0.9% FLUSH
10.0000 mL | INTRAVENOUS | Status: DC | PRN
  Administered 2020-08-15: 10 mL
  Filled 2020-08-15: qty 10

## 2020-08-15 MED ORDER — SODIUM CHLORIDE 0.9 % IV SOLN
20.0000 mg | Freq: Once | INTRAVENOUS | Status: AC
  Administered 2020-08-15: 20 mg via INTRAVENOUS
  Filled 2020-08-15: qty 20

## 2020-08-15 NOTE — Progress Notes (Signed)
Per MD okay to treat with Crt 1.54

## 2020-08-15 NOTE — Patient Instructions (Signed)
[facility] Discharge Instructions for Patients Receiving Chemotherapy  Today you received the following chemotherapy agents:  Kyprolis  To help prevent nausea and vomiting after your treatment, we encourage you to take your nausea medication as directed.   If you develop nausea and vomiting that is not controlled by your nausea medication, call the clinic.   BELOW ARE SYMPTOMS THAT SHOULD BE REPORTED IMMEDIATELY:  *FEVER GREATER THAN 100.5 F  *CHILLS WITH OR WITHOUT FEVER  NAUSEA AND VOMITING THAT IS NOT CONTROLLED WITH YOUR NAUSEA MEDICATION  *UNUSUAL SHORTNESS OF BREATH  *UNUSUAL BRUISING OR BLEEDING  TENDERNESS IN MOUTH AND THROAT WITH OR WITHOUT PRESENCE OF ULCERS  *URINARY PROBLEMS  *BOWEL PROBLEMS  UNUSUAL RASH Items with * indicate a potential emergency and should be followed up as soon as possible.  Feel free to call the clinic should you have any questions or concerns. The clinic phone number is [phone].  Please show the CHEMO ALERT CARD at check-in to the Emergency Department and triage nurse.   

## 2020-08-15 NOTE — Progress Notes (Signed)
Transplant team recommending Kyprolis D1, 8, 15 q 28 days before moving to q 2 week treatments.  MD adjusted orders.  Raul [employer] [place], [place], BCPS, BCP 08/15/2020 2:49 PM
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
## 2020-08-22 ENCOUNTER — Inpatient Hospital Stay: Payer: Medicare Other

## 2020-08-22 ENCOUNTER — Other Ambulatory Visit: Payer: Self-pay

## 2020-08-22 ENCOUNTER — Inpatient Hospital Stay: Payer: Medicare Other | Admitting: Hematology

## 2020-08-22 ENCOUNTER — Inpatient Hospital Stay: Payer: Medicare Other | Attending: Hematology

## 2020-08-22 VITALS — BP 120/77 | HR 82 | Temp 97.0°F | Resp 18 | Ht 71.0 in | Wt 195.7 lb

## 2020-08-22 DIAGNOSIS — C9000 Multiple myeloma not having achieved remission: Secondary | ICD-10-CM | POA: Insufficient documentation

## 2020-08-22 DIAGNOSIS — Z23 Encounter for immunization: Secondary | ICD-10-CM

## 2020-08-22 DIAGNOSIS — Z95828 Presence of other vascular implants and grafts: Secondary | ICD-10-CM

## 2020-08-22 DIAGNOSIS — Z9481 Bone marrow transplant status: Secondary | ICD-10-CM

## 2020-08-22 DIAGNOSIS — Z5112 Encounter for antineoplastic immunotherapy: Secondary | ICD-10-CM | POA: Insufficient documentation

## 2020-08-22 DIAGNOSIS — Z7189 Other specified counseling: Secondary | ICD-10-CM

## 2020-08-22 DIAGNOSIS — W009XXA Unspecified fall due to ice and snow, initial encounter: Secondary | ICD-10-CM

## 2020-08-22 DIAGNOSIS — S0990XA Unspecified injury of head, initial encounter: Secondary | ICD-10-CM | POA: Diagnosis not present

## 2020-08-22 DIAGNOSIS — Z79899 Other long term (current) drug therapy: Secondary | ICD-10-CM | POA: Diagnosis not present

## 2020-08-22 LAB — CMP (CANCER CENTER ONLY)
ALT: 16 U/L (ref 0–44)
AST: 23 U/L (ref 15–41)
Albumin: 3.9 g/dL (ref 3.5–5.0)
Alkaline Phosphatase: 61 U/L (ref 38–126)
Anion gap: 8 (ref 5–15)
BUN: 18 mg/dL (ref 8–23)
CO2: 25 mmol/L (ref 22–32)
Calcium: 9.6 mg/dL (ref 8.9–10.3)
Chloride: 105 mmol/L (ref 98–111)
Creatinine: 1.48 mg/dL — ABNORMAL HIGH (ref 0.61–1.24)
GFR, Estimated: 50 mL/min — ABNORMAL LOW (ref >60)
Glucose, Bld: 96 mg/dL (ref 70–99)
Potassium: 4.2 mmol/L (ref 3.5–5.1)
Sodium: 138 mmol/L (ref 135–145)
Total Bilirubin: 0.8 mg/dL (ref 0.3–1.2)
Total Protein: 6.1 g/dL — ABNORMAL LOW (ref 6.5–8.1)

## 2020-08-22 LAB — CBC WITH DIFFERENTIAL/PLATELET
Abs Immature Granulocytes: 0.02 10*3/uL (ref 0.00–0.07)
Basophils Absolute: 0 10*3/uL (ref 0.0–0.1)
Basophils Relative: 0 %
Eosinophils Absolute: 0.1 10*3/uL (ref 0.0–0.5)
Eosinophils Relative: 1 %
HCT: 37 % — ABNORMAL LOW (ref 39.0–52.0)
Hemoglobin: 12.4 g/dL — ABNORMAL LOW (ref 13.0–17.0)
Immature Granulocytes: 0 %
Lymphocytes Relative: 31 %
Lymphs Abs: 1.7 10*3/uL (ref 0.7–4.0)
MCH: 33.2 pg (ref 26.0–34.0)
MCHC: 33.5 g/dL (ref 30.0–36.0)
MCV: 99.2 fL (ref 80.0–100.0)
Monocytes Absolute: 0.6 10*3/uL (ref 0.1–1.0)
Monocytes Relative: 11 %
Neutro Abs: 3 10*3/uL (ref 1.7–7.7)
Neutrophils Relative %: 57 %
Platelets: 136 10*3/uL — ABNORMAL LOW (ref 150–400)
RBC: 3.73 MIL/uL — ABNORMAL LOW (ref 4.22–5.81)
RDW: 13.5 % (ref 11.5–15.5)
WBC: 5.4 10*3/uL (ref 4.0–10.5)
nRBC: 0 % (ref 0.0–0.2)

## 2020-08-22 MED ORDER — SODIUM CHLORIDE 0.9 % IV SOLN
Freq: Once | INTRAVENOUS | Status: AC
  Filled 2020-08-22: qty 250

## 2020-08-22 MED ORDER — HEPARIN SOD (PORK) LOCK FLUSH 100 UNIT/ML IV SOLN
500.0000 [IU] | Freq: Once | INTRAVENOUS | Status: AC | PRN
  Administered 2020-08-22: 500 [IU]
  Filled 2020-08-22: qty 5

## 2020-08-22 MED ORDER — SODIUM CHLORIDE 0.9 % IV SOLN
20.0000 mg | Freq: Once | INTRAVENOUS | Status: AC
  Administered 2020-08-22: 20 mg via INTRAVENOUS
  Filled 2020-08-22: qty 20

## 2020-08-22 MED ORDER — DEXTROSE 5 % IV SOLN
56.0000 mg/m2 | Freq: Once | INTRAVENOUS | Status: AC
  Administered 2020-08-22: 120 mg via INTRAVENOUS
  Filled 2020-08-22: qty 60

## 2020-08-22 MED ORDER — PROCHLORPERAZINE MALEATE 10 MG PO TABS
ORAL_TABLET | ORAL | Status: AC
  Filled 2020-08-22: qty 1

## 2020-08-22 MED ORDER — SODIUM CHLORIDE 0.9% FLUSH
10.0000 mL | INTRAVENOUS | Status: DC | PRN
  Administered 2020-08-22: 10 mL
  Filled 2020-08-22: qty 10

## 2020-08-22 MED ORDER — PROCHLORPERAZINE MALEATE 10 MG PO TABS
10.0000 mg | ORAL_TABLET | Freq: Once | ORAL | Status: AC
  Administered 2020-08-22: 10 mg via ORAL

## 2020-08-22 MED ORDER — SODIUM CHLORIDE 0.9 % IV SOLN
60.0000 mg | Freq: Once | INTRAVENOUS | Status: AC
  Administered 2020-08-22: 60 mg via INTRAVENOUS
  Filled 2020-08-22: qty 10

## 2020-08-22 NOTE — Patient Instructions (Signed)
[facility] Discharge Instructions for Patients Receiving Chemotherapy  Today you received the following chemotherapy agents Carfilizomab, Pamidronate,  To help prevent nausea and vomiting after your treatment, we encourage you to take your nausea medication as directed.    If you develop nausea and vomiting that is not controlled by your nausea medication, call the clinic.   BELOW ARE SYMPTOMS THAT SHOULD BE REPORTED IMMEDIATELY:  *FEVER GREATER THAN 100.5 F  *CHILLS WITH OR WITHOUT FEVER  NAUSEA AND VOMITING THAT IS NOT CONTROLLED WITH YOUR NAUSEA MEDICATION  *UNUSUAL SHORTNESS OF BREATH  *UNUSUAL BRUISING OR BLEEDING  TENDERNESS IN MOUTH AND THROAT WITH OR WITHOUT PRESENCE OF ULCERS  *URINARY PROBLEMS  *BOWEL PROBLEMS  UNUSUAL RASH Items with * indicate a potential emergency and should be followed up as soon as possible.  Feel free to call the clinic should you have any questions or concerns. The clinic phone number is [phone].  Please show the [facility] at check-in to the Emergency Department and triage nurse.

## 2020-08-23 LAB — MULTIPLE MYELOMA PANEL, SERUM
Albumin SerPl Elph-Mcnc: 3.4 g/dL (ref 2.9–4.4)
Albumin/Glob SerPl: 1.5 (ref 0.7–1.7)
Alpha 1: 0.2 g/dL (ref 0.0–0.4)
Alpha2 Glob SerPl Elph-Mcnc: 0.8 g/dL (ref 0.4–1.0)
B-Globulin SerPl Elph-Mcnc: 0.9 g/dL (ref 0.7–1.3)
Gamma Glob SerPl Elph-Mcnc: 0.3 g/dL — ABNORMAL LOW (ref 0.4–1.8)
Globulin, Total: 2.3 g/dL (ref 2.2–3.9)
IgA: 19 mg/dL — ABNORMAL LOW (ref 61–437)
IgG (Immunoglobin G), Serum: 309 mg/dL — ABNORMAL LOW (ref 603–1613)
IgM (Immunoglobulin M), Srm: 11 mg/dL — ABNORMAL LOW (ref 15–143)
M Protein SerPl Elph-Mcnc: 0.1 g/dL — ABNORMAL HIGH
Total Protein ELP: 5.7 g/dL — ABNORMAL LOW (ref 6.0–8.5)

## 2020-08-23 LAB — KAPPA/LAMBDA LIGHT CHAINS
Kappa free light chain: 6.9 mg/L (ref 3.3–19.4)
Kappa, lambda light chain ratio: 2.56 — ABNORMAL HIGH (ref 0.26–1.65)
Lambda free light chains: 2.7 mg/L — ABNORMAL LOW (ref 5.7–26.3)

## 2020-09-01 ENCOUNTER — Other Ambulatory Visit: Payer: Self-pay

## 2020-09-01 ENCOUNTER — Encounter (HOSPITAL_COMMUNITY): Payer: Self-pay

## 2020-09-01 ENCOUNTER — Ambulatory Visit (HOSPITAL_COMMUNITY)
Admission: RE | Admit: 2020-09-01 | Discharge: 2020-09-01 | Disposition: A | Discharge status: Home / Self Care | Payer: Medicare Other | Source: Ambulatory Visit | Attending: Hematology | Admitting: Hematology

## 2020-09-01 DIAGNOSIS — Y939 Activity, unspecified: Secondary | ICD-10-CM | POA: Diagnosis not present

## 2020-09-01 DIAGNOSIS — S0990XA Unspecified injury of head, initial encounter: Secondary | ICD-10-CM | POA: Insufficient documentation

## 2020-09-01 DIAGNOSIS — M542 Cervicalgia: Secondary | ICD-10-CM | POA: Diagnosis not present

## 2020-09-01 DIAGNOSIS — M50323 Other cervical disc degeneration at C6-C7 level: Secondary | ICD-10-CM | POA: Diagnosis not present

## 2020-09-01 DIAGNOSIS — I6782 Cerebral ischemia: Secondary | ICD-10-CM | POA: Diagnosis not present

## 2020-09-01 DIAGNOSIS — M4802 Spinal stenosis, cervical region: Secondary | ICD-10-CM | POA: Insufficient documentation

## 2020-09-01 DIAGNOSIS — W009XXA Unspecified fall due to ice and snow, initial encounter: Secondary | ICD-10-CM | POA: Diagnosis not present

## 2020-09-01 DIAGNOSIS — Y929 Unspecified place or not applicable: Secondary | ICD-10-CM | POA: Diagnosis not present

## 2020-09-01 DIAGNOSIS — M47812 Spondylosis without myelopathy or radiculopathy, cervical region: Secondary | ICD-10-CM | POA: Insufficient documentation

## 2020-09-01 DIAGNOSIS — R519 Headache, unspecified: Secondary | ICD-10-CM | POA: Diagnosis not present
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
## 2020-09-05 ENCOUNTER — Other Ambulatory Visit: Payer: Self-pay

## 2020-09-05 ENCOUNTER — Inpatient Hospital Stay: Payer: Medicare Other

## 2020-09-05 ENCOUNTER — Inpatient Hospital Stay (HOSPITAL_BASED_OUTPATIENT_CLINIC_OR_DEPARTMENT_OTHER): Payer: Medicare Other | Admitting: Hematology

## 2020-09-05 VITALS — BP 107/71 | HR 98 | Temp 98.4°F | Resp 18 | Ht 71.0 in | Wt 192.8 lb

## 2020-09-05 DIAGNOSIS — C9000 Multiple myeloma not having achieved remission: Secondary | ICD-10-CM

## 2020-09-05 DIAGNOSIS — Z95828 Presence of other vascular implants and grafts: Secondary | ICD-10-CM

## 2020-09-05 DIAGNOSIS — Z5111 Encounter for antineoplastic chemotherapy: Secondary | ICD-10-CM

## 2020-09-05 DIAGNOSIS — Z23 Encounter for immunization: Secondary | ICD-10-CM | POA: Diagnosis not present

## 2020-09-05 DIAGNOSIS — Z5112 Encounter for antineoplastic immunotherapy: Secondary | ICD-10-CM | POA: Diagnosis not present

## 2020-09-05 DIAGNOSIS — Z7189 Other specified counseling: Secondary | ICD-10-CM

## 2020-09-05 DIAGNOSIS — Z79899 Other long term (current) drug therapy: Secondary | ICD-10-CM | POA: Diagnosis not present

## 2020-09-05 LAB — CBC WITH DIFFERENTIAL/PLATELET
Abs Immature Granulocytes: 0.01 10*3/uL (ref 0.00–0.07)
Basophils Absolute: 0.1 10*3/uL (ref 0.0–0.1)
Basophils Relative: 1 %
Eosinophils Absolute: 0.1 10*3/uL (ref 0.0–0.5)
Eosinophils Relative: 1 %
HCT: 36.5 % — ABNORMAL LOW (ref 39.0–52.0)
Hemoglobin: 12.5 g/dL — ABNORMAL LOW (ref 13.0–17.0)
Immature Granulocytes: 0 %
Lymphocytes Relative: 22 %
Lymphs Abs: 1.1 10*3/uL (ref 0.7–4.0)
MCH: 34 pg (ref 26.0–34.0)
MCHC: 34.2 g/dL (ref 30.0–36.0)
MCV: 99.2 fL (ref 80.0–100.0)
Monocytes Absolute: 0.5 10*3/uL (ref 0.1–1.0)
Monocytes Relative: 11 %
Neutro Abs: 3.2 10*3/uL (ref 1.7–7.7)
Neutrophils Relative %: 65 %
Platelets: 201 10*3/uL (ref 150–400)
RBC: 3.68 MIL/uL — ABNORMAL LOW (ref 4.22–5.81)
RDW: 14 % (ref 11.5–15.5)
WBC: 4.9 10*3/uL (ref 4.0–10.5)
nRBC: 0 % (ref 0.0–0.2)

## 2020-09-05 LAB — CMP (CANCER CENTER ONLY)
ALT: 16 U/L (ref 0–44)
AST: 25 U/L (ref 15–41)
Albumin: 4.1 g/dL (ref 3.5–5.0)
Alkaline Phosphatase: 64 U/L (ref 38–126)
Anion gap: 10 (ref 5–15)
BUN: 17 mg/dL (ref 8–23)
CO2: 23 mmol/L (ref 22–32)
Calcium: 9.5 mg/dL (ref 8.9–10.3)
Chloride: 104 mmol/L (ref 98–111)
Creatinine: 1.53 mg/dL — ABNORMAL HIGH (ref 0.61–1.24)
GFR, Estimated: 48 mL/min — ABNORMAL LOW (ref >60)
Glucose, Bld: 145 mg/dL — ABNORMAL HIGH (ref 70–99)
Potassium: 4 mmol/L (ref 3.5–5.1)
Sodium: 137 mmol/L (ref 135–145)
Total Bilirubin: 0.8 mg/dL (ref 0.3–1.2)
Total Protein: 6.5 g/dL (ref 6.5–8.1)

## 2020-09-05 MED ORDER — SODIUM CHLORIDE 0.9 % IV SOLN
20.0000 mg | Freq: Once | INTRAVENOUS | Status: AC
  Administered 2020-09-05: 20 mg via INTRAVENOUS
  Filled 2020-09-05: qty 20

## 2020-09-05 MED ORDER — DEXTROSE 5 % IV SOLN
56.0000 mg/m2 | Freq: Once | INTRAVENOUS | Status: AC
  Administered 2020-09-05: 120 mg via INTRAVENOUS
  Filled 2020-09-05: qty 60

## 2020-09-05 MED ORDER — SODIUM CHLORIDE 0.9 % IV SOLN
Freq: Once | INTRAVENOUS | Status: AC
  Filled 2020-09-05: qty 250

## 2020-09-05 MED ORDER — SODIUM CHLORIDE 0.9 % IV SOLN
Freq: Once | INTRAVENOUS | Status: DC
  Filled 2020-09-05: qty 250

## 2020-09-05 MED ORDER — SODIUM CHLORIDE 0.9% FLUSH
10.0000 mL | Freq: Once | INTRAVENOUS | Status: AC
  Administered 2020-09-05: 10 mL
  Filled 2020-09-05: qty 10

## 2020-09-05 MED ORDER — PROCHLORPERAZINE MALEATE 10 MG PO TABS
10.0000 mg | ORAL_TABLET | Freq: Once | ORAL | Status: AC
  Administered 2020-09-05: 10 mg via ORAL

## 2020-09-05 NOTE — Patient Instructions (Signed)
[facility] Discharge Instructions for Patients Receiving Chemotherapy  Today you received the following chemotherapy agents:  Kyprolis  To help prevent nausea and vomiting after your treatment, we encourage you to take your nausea medication as directed.   If you develop nausea and vomiting that is not controlled by your nausea medication, call the clinic.   BELOW ARE SYMPTOMS THAT SHOULD BE REPORTED IMMEDIATELY:  *FEVER GREATER THAN 100.5 F  *CHILLS WITH OR WITHOUT FEVER  NAUSEA AND VOMITING THAT IS NOT CONTROLLED WITH YOUR NAUSEA MEDICATION  *UNUSUAL SHORTNESS OF BREATH  *UNUSUAL BRUISING OR BLEEDING  TENDERNESS IN MOUTH AND THROAT WITH OR WITHOUT PRESENCE OF ULCERS  *URINARY PROBLEMS  *BOWEL PROBLEMS  UNUSUAL RASH Items with * indicate a potential emergency and should be followed up as soon as possible.  Feel free to call the clinic should you have any questions or concerns. The clinic phone number is [phone].  Please show the CHEMO ALERT CARD at check-in to the Emergency Department and triage nurse.   

## 2020-09-05 NOTE — Patient Instructions (Signed)

## 2020-09-05 NOTE — Progress Notes (Signed)
Ok to treat per Dr Irene Limbo with Scr 1.53 today

## 2020-09-11 DIAGNOSIS — M4120 Other idiopathic scoliosis, site unspecified: Secondary | ICD-10-CM | POA: Diagnosis not present

## 2020-09-11 DIAGNOSIS — M5416 Radiculopathy, lumbar region: Secondary | ICD-10-CM | POA: Diagnosis not present

## 2020-09-11 DIAGNOSIS — M545 Low back pain, unspecified: Secondary | ICD-10-CM | POA: Diagnosis not present

## 2020-09-11 DIAGNOSIS — M48061 Spinal stenosis, lumbar region without neurogenic claudication: Secondary | ICD-10-CM | POA: Diagnosis not present

## 2020-09-12 ENCOUNTER — Inpatient Hospital Stay: Payer: Medicare Other

## 2020-09-12 ENCOUNTER — Other Ambulatory Visit: Payer: Self-pay

## 2020-09-12 VITALS — BP 110/73 | HR 80 | Temp 98.3°F | Resp 18

## 2020-09-12 DIAGNOSIS — C9000 Multiple myeloma not having achieved remission: Secondary | ICD-10-CM

## 2020-09-12 DIAGNOSIS — Z23 Encounter for immunization: Secondary | ICD-10-CM | POA: Diagnosis not present

## 2020-09-12 DIAGNOSIS — Z5112 Encounter for antineoplastic immunotherapy: Secondary | ICD-10-CM | POA: Diagnosis not present

## 2020-09-12 DIAGNOSIS — Z79899 Other long term (current) drug therapy: Secondary | ICD-10-CM | POA: Diagnosis not present

## 2020-09-12 DIAGNOSIS — Z95828 Presence of other vascular implants and grafts: Secondary | ICD-10-CM

## 2020-09-12 DIAGNOSIS — Z7189 Other specified counseling: Secondary | ICD-10-CM

## 2020-09-12 DIAGNOSIS — Z5111 Encounter for antineoplastic chemotherapy: Secondary | ICD-10-CM

## 2020-09-12 LAB — CBC WITH DIFFERENTIAL/PLATELET
Abs Immature Granulocytes: 0.02 10*3/uL (ref 0.00–0.07)
Basophils Absolute: 0 10*3/uL (ref 0.0–0.1)
Basophils Relative: 0 %
Eosinophils Absolute: 0.1 10*3/uL (ref 0.0–0.5)
Eosinophils Relative: 1 %
HCT: 36 % — ABNORMAL LOW (ref 39.0–52.0)
Hemoglobin: 11.9 g/dL — ABNORMAL LOW (ref 13.0–17.0)
Immature Granulocytes: 0 %
Lymphocytes Relative: 31 %
Lymphs Abs: 1.4 10*3/uL (ref 0.7–4.0)
MCH: 33.1 pg (ref 26.0–34.0)
MCHC: 33.1 g/dL (ref 30.0–36.0)
MCV: 100.3 fL — ABNORMAL HIGH (ref 80.0–100.0)
Monocytes Absolute: 0.7 10*3/uL (ref 0.1–1.0)
Monocytes Relative: 14 %
Neutro Abs: 2.4 10*3/uL (ref 1.7–7.7)
Neutrophils Relative %: 54 %
Platelets: 113 10*3/uL — ABNORMAL LOW (ref 150–400)
RBC: 3.59 MIL/uL — ABNORMAL LOW (ref 4.22–5.81)
RDW: 14.3 % (ref 11.5–15.5)
WBC: 4.6 10*3/uL (ref 4.0–10.5)
nRBC: 0 % (ref 0.0–0.2)

## 2020-09-12 LAB — CMP (CANCER CENTER ONLY)
ALT: 19 U/L (ref 0–44)
AST: 25 U/L (ref 15–41)
Albumin: 3.9 g/dL (ref 3.5–5.0)
Alkaline Phosphatase: 66 U/L (ref 38–126)
Anion gap: 7 (ref 5–15)
BUN: 17 mg/dL (ref 8–23)
CO2: 24 mmol/L (ref 22–32)
Calcium: 9.1 mg/dL (ref 8.9–10.3)
Chloride: 106 mmol/L (ref 98–111)
Creatinine: 1.46 mg/dL — ABNORMAL HIGH (ref 0.61–1.24)
GFR, Estimated: 50 mL/min — ABNORMAL LOW (ref >60)
Glucose, Bld: 81 mg/dL (ref 70–99)
Potassium: 4.5 mmol/L (ref 3.5–5.1)
Sodium: 137 mmol/L (ref 135–145)
Total Bilirubin: 0.6 mg/dL (ref 0.3–1.2)
Total Protein: 6.1 g/dL — ABNORMAL LOW (ref 6.5–8.1)

## 2020-09-12 MED ORDER — SODIUM CHLORIDE 0.9% FLUSH
10.0000 mL | Freq: Once | INTRAVENOUS | Status: AC
  Administered 2020-09-12: 10 mL
  Filled 2020-09-12: qty 10

## 2020-09-12 MED ORDER — HEPARIN SOD (PORK) LOCK FLUSH 100 UNIT/ML IV SOLN
500.0000 [IU] | Freq: Once | INTRAVENOUS | Status: AC | PRN
  Administered 2020-09-12: 500 [IU]
  Filled 2020-09-12: qty 5

## 2020-09-12 MED ORDER — SODIUM CHLORIDE 0.9% FLUSH
10.0000 mL | INTRAVENOUS | Status: DC | PRN
  Administered 2020-09-12: 10 mL
  Filled 2020-09-12: qty 10

## 2020-09-12 MED ORDER — SODIUM CHLORIDE 0.9 % IV SOLN
Freq: Once | INTRAVENOUS | Status: AC
  Filled 2020-09-12: qty 250

## 2020-09-12 MED ORDER — PROCHLORPERAZINE MALEATE 10 MG PO TABS
ORAL_TABLET | ORAL | Status: AC
  Filled 2020-09-12: qty 1

## 2020-09-12 MED ORDER — DEXTROSE 5 % IV SOLN
56.0000 mg/m2 | Freq: Once | INTRAVENOUS | Status: AC
  Administered 2020-09-12: 120 mg via INTRAVENOUS
  Filled 2020-09-12: qty 60

## 2020-09-12 MED ORDER — PROCHLORPERAZINE MALEATE 10 MG PO TABS
10.0000 mg | ORAL_TABLET | Freq: Once | ORAL | Status: AC
  Administered 2020-09-12: 10 mg via ORAL

## 2020-09-12 MED ORDER — SODIUM CHLORIDE 0.9 % IV SOLN
20.0000 mg | Freq: Once | INTRAVENOUS | Status: AC
  Administered 2020-09-12: 20 mg via INTRAVENOUS
  Filled 2020-09-12: qty 20

## 2020-09-15 ENCOUNTER — Other Ambulatory Visit: Payer: Self-pay | Admitting: Adult Health

## 2020-09-15 ENCOUNTER — Encounter: Payer: Self-pay | Admitting: Hematology

## 2020-09-15 DIAGNOSIS — C9000 Multiple myeloma not having achieved remission: Secondary | ICD-10-CM

## 2020-09-15 NOTE — Progress Notes (Signed)
Referring patient for evusheld per Dr. Grier Mitts nurse Lanelle Bal.  I let her know that they are updating dosing recommendations on this and we may not be ready to give it this Tuesday.

## 2020-09-18 ENCOUNTER — Other Ambulatory Visit: Payer: Self-pay | Admitting: Hematology

## 2020-09-18 NOTE — Telephone Encounter (Signed)
Please review for refill thank you. 
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
## 2020-09-19 ENCOUNTER — Inpatient Hospital Stay: Payer: Medicare Other | Admitting: Hematology

## 2020-09-19 ENCOUNTER — Inpatient Hospital Stay: Payer: Medicare Other

## 2020-09-19 ENCOUNTER — Ambulatory Visit: Payer: Medicare Other

## 2020-09-19 ENCOUNTER — Inpatient Hospital Stay: Payer: Medicare Other | Attending: Hematology

## 2020-09-19 ENCOUNTER — Other Ambulatory Visit: Payer: Medicare Other

## 2020-09-19 ENCOUNTER — Other Ambulatory Visit: Payer: Self-pay

## 2020-09-19 DIAGNOSIS — Z79899 Other long term (current) drug therapy: Secondary | ICD-10-CM | POA: Insufficient documentation

## 2020-09-19 DIAGNOSIS — C9000 Multiple myeloma not having achieved remission: Secondary | ICD-10-CM | POA: Insufficient documentation

## 2020-09-19 DIAGNOSIS — Z5112 Encounter for antineoplastic immunotherapy: Secondary | ICD-10-CM | POA: Insufficient documentation

## 2020-09-19 DIAGNOSIS — Z298 Encounter for other specified prophylactic measures: Secondary | ICD-10-CM | POA: Diagnosis not present

## 2020-09-19 DIAGNOSIS — Z95828 Presence of other vascular implants and grafts: Secondary | ICD-10-CM

## 2020-09-19 DIAGNOSIS — Z9221 Personal history of antineoplastic chemotherapy: Secondary | ICD-10-CM | POA: Diagnosis not present

## 2020-09-19 DIAGNOSIS — Z5111 Encounter for antineoplastic chemotherapy: Secondary | ICD-10-CM

## 2020-09-19 DIAGNOSIS — Z7189 Other specified counseling: Secondary | ICD-10-CM

## 2020-09-19 LAB — CBC WITH DIFFERENTIAL/PLATELET
Abs Immature Granulocytes: 0.01 10*3/uL (ref 0.00–0.07)
Basophils Absolute: 0 10*3/uL (ref 0.0–0.1)
Basophils Relative: 1 %
Eosinophils Absolute: 0.1 10*3/uL (ref 0.0–0.5)
Eosinophils Relative: 2 %
HCT: 35.4 % — ABNORMAL LOW (ref 39.0–52.0)
Hemoglobin: 11.7 g/dL — ABNORMAL LOW (ref 13.0–17.0)
Immature Granulocytes: 0 %
Lymphocytes Relative: 21 %
Lymphs Abs: 0.9 10*3/uL (ref 0.7–4.0)
MCH: 33.4 pg (ref 26.0–34.0)
MCHC: 33.1 g/dL (ref 30.0–36.0)
MCV: 101.1 fL — ABNORMAL HIGH (ref 80.0–100.0)
Monocytes Absolute: 0.7 10*3/uL (ref 0.1–1.0)
Monocytes Relative: 16 %
Neutro Abs: 2.6 10*3/uL (ref 1.7–7.7)
Neutrophils Relative %: 60 %
Platelets: 112 10*3/uL — ABNORMAL LOW (ref 150–400)
RBC: 3.5 MIL/uL — ABNORMAL LOW (ref 4.22–5.81)
RDW: 14.6 % (ref 11.5–15.5)
WBC: 4.3 10*3/uL (ref 4.0–10.5)
nRBC: 0 % (ref 0.0–0.2)

## 2020-09-19 LAB — CMP (CANCER CENTER ONLY)
ALT: 20 U/L (ref 0–44)
AST: 24 U/L (ref 15–41)
Albumin: 3.9 g/dL (ref 3.5–5.0)
Alkaline Phosphatase: 66 U/L (ref 38–126)
Anion gap: 11 (ref 5–15)
BUN: 20 mg/dL (ref 8–23)
CO2: 25 mmol/L (ref 22–32)
Calcium: 9.4 mg/dL (ref 8.9–10.3)
Chloride: 105 mmol/L (ref 98–111)
Creatinine: 1.54 mg/dL — ABNORMAL HIGH (ref 0.61–1.24)
GFR, Estimated: 47 mL/min — ABNORMAL LOW (ref >60)
Glucose, Bld: 93 mg/dL (ref 70–99)
Potassium: 4.4 mmol/L (ref 3.5–5.1)
Sodium: 141 mmol/L (ref 135–145)
Total Bilirubin: 0.7 mg/dL (ref 0.3–1.2)
Total Protein: 6.1 g/dL — ABNORMAL LOW (ref 6.5–8.1)

## 2020-09-19 MED ORDER — SODIUM CHLORIDE 0.9 % IV SOLN
20.0000 mg | Freq: Once | INTRAVENOUS | Status: AC
  Administered 2020-09-19: 20 mg via INTRAVENOUS
  Filled 2020-09-19: qty 20

## 2020-09-19 MED ORDER — PROCHLORPERAZINE MALEATE 10 MG PO TABS
ORAL_TABLET | ORAL | Status: AC
  Filled 2020-09-19: qty 1

## 2020-09-19 MED ORDER — SODIUM CHLORIDE 0.9% FLUSH
10.0000 mL | INTRAVENOUS | Status: DC | PRN
  Administered 2020-09-19: 10 mL
  Filled 2020-09-19: qty 10

## 2020-09-19 MED ORDER — SODIUM CHLORIDE 0.9 % IV SOLN
Freq: Once | INTRAVENOUS | Status: DC
  Filled 2020-09-19: qty 250

## 2020-09-19 MED ORDER — SODIUM CHLORIDE 0.9% FLUSH
10.0000 mL | Freq: Once | INTRAVENOUS | Status: AC
  Administered 2020-09-19: 10 mL
  Filled 2020-09-19: qty 10

## 2020-09-19 MED ORDER — PROCHLORPERAZINE MALEATE 10 MG PO TABS
10.0000 mg | ORAL_TABLET | Freq: Once | ORAL | Status: AC
  Administered 2020-09-19: 10 mg via ORAL

## 2020-09-19 MED ORDER — SODIUM CHLORIDE 0.9 % IV SOLN
Freq: Once | INTRAVENOUS | Status: AC
  Filled 2020-09-19: qty 250

## 2020-09-19 MED ORDER — DEXTROSE 5 % IV SOLN
56.0000 mg/m2 | Freq: Once | INTRAVENOUS | Status: AC
  Administered 2020-09-19: 120 mg via INTRAVENOUS
  Filled 2020-09-19: qty 60

## 2020-09-19 MED ORDER — HEPARIN SOD (PORK) LOCK FLUSH 100 UNIT/ML IV SOLN
500.0000 [IU] | Freq: Once | INTRAVENOUS | Status: AC | PRN
  Administered 2020-09-19: 500 [IU]
  Filled 2020-09-19: qty 5

## 2020-09-19 NOTE — Patient Instructions (Signed)
[facility] Discharge Instructions for Patients Receiving Chemotherapy  Today you received the following chemotherapy agents:  Kyprolis  To help prevent nausea and vomiting after your treatment, we encourage you to take your nausea medication as directed.   If you develop nausea and vomiting that is not controlled by your nausea medication, call the clinic.   BELOW ARE SYMPTOMS THAT SHOULD BE REPORTED IMMEDIATELY:  *FEVER GREATER THAN 100.5 F  *CHILLS WITH OR WITHOUT FEVER  NAUSEA AND VOMITING THAT IS NOT CONTROLLED WITH YOUR NAUSEA MEDICATION  *UNUSUAL SHORTNESS OF BREATH  *UNUSUAL BRUISING OR BLEEDING  TENDERNESS IN MOUTH AND THROAT WITH OR WITHOUT PRESENCE OF ULCERS  *URINARY PROBLEMS  *BOWEL PROBLEMS  UNUSUAL RASH Items with * indicate a potential emergency and should be followed up as soon as possible.  Feel free to call the clinic should you have any questions or concerns. The clinic phone number is [phone].  Please show the CHEMO ALERT CARD at check-in to the Emergency Department and triage nurse.   

## 2020-09-19 NOTE — Patient Instructions (Signed)
Implanted Port Insertion, Care After This sheet gives you information about how to care for yourself after your procedure. Your health care provider may also give you more specific instructions. If you have problems or questions, contact your health care provider. What can I expect after the procedure? After the procedure, it is common to have:  Discomfort at the port insertion site.  Bruising on the skin over the port. This should improve over 3-4 days. Follow these instructions at home: Port care  After your port is placed, you will get a manufacturer's information card. The card has information about your port. Keep this card with you at all times.  Take care of the port as told by your health care provider. Ask your health care provider if you or a family member can get training for taking care of the port at home. A home health care nurse may also take care of the port.  Make sure to remember what type of port you have. Incision care  Follow instructions from your health care provider about how to take care of your port insertion site. Make sure you: ? Wash your hands with soap and water before and after you change your bandage (dressing). If soap and water are not available, use hand sanitizer. ? Change your dressing as told by your health care provider. ? Leave stitches (sutures), skin glue, or adhesive strips in place. These skin closures may need to stay in place for 2 weeks or longer. If adhesive strip edges start to loosen and curl up, you may trim the loose edges. Do not remove adhesive strips completely unless your health care provider tells you to do that.  Check your port insertion site every day for signs of infection. Check for: ? Redness, swelling, or pain. ? Fluid or blood. ? Warmth. ? Pus or a bad smell.      Activity  Return to your normal activities as told by your health care provider. Ask your health care provider what activities are safe for you.  Do not  lift anything that is heavier than 10 lb (4.5 kg), or the limit that you are told, until your health care provider says that it is safe. General instructions  Take over-the-counter and prescription medicines only as told by your health care provider.  Do not take baths, swim, or use a hot tub until your health care provider approves. Ask your health care provider if you may take showers. You may only be allowed to take sponge baths.  Do not drive for 24 hours if you were given a sedative during your procedure.  Wear a medical alert bracelet in case of an emergency. This will tell any health care providers that you have a port.  Keep all follow-up visits as told by your health care provider. This is important. Contact a health care provider if:  You cannot flush your port with saline as directed, or you cannot draw blood from the port.  You have a fever or chills.  You have redness, swelling, or pain around your port insertion site.  You have fluid or blood coming from your port insertion site.  Your port insertion site feels warm to the touch.  You have pus or a bad smell coming from the port insertion site. Get help right away if:  You have chest pain or shortness of breath.  You have bleeding from your port that you cannot control. Summary  Take care of the port as told by your   health care provider. Keep the manufacturer's information card with you at all times.  Change your dressing as told by your health care provider.  Contact a health care provider if you have a fever or chills or if you have redness, swelling, or pain around your port insertion site.  Keep all follow-up visits as told by your health care provider. This information is not intended to replace advice given to you by your health care provider. Make sure you discuss any questions you have with your health care provider. Document Revised: 02/03/2018 Document Reviewed: 02/03/2018 Elsevier Patient Education   2021 [employer] Inc.  

## 2020-09-19 NOTE — Progress Notes (Signed)
Per Dr. Irene Limbo, ok to treat with Scr 1.54.   Patient inquired about getting Aredia today, as he thought he was to get it every month.  Last dose was 08/22/20 and treatment plan states patient to receive Aredia every 12 weeks.  Checked with Dr. Irene Limbo for clarification and, per Dr. Irene Limbo, patient to receive Aredia every 12 weeks.  Informed patient of Dr. Grier Mitts response and patient verbalized understanding.

## 2020-09-24 ENCOUNTER — Other Ambulatory Visit: Payer: Self-pay | Admitting: Hematology

## 2020-09-24 NOTE — Progress Notes (Deleted)
This encounter was created in error - please disregard.

## 2020-09-25 ENCOUNTER — Encounter: Payer: Self-pay | Admitting: Adult Health

## 2020-09-25 ENCOUNTER — Other Ambulatory Visit: Payer: Self-pay | Admitting: Adult Health

## 2020-09-25 NOTE — Telephone Encounter (Signed)
Kindly review for refill. 

## 2020-09-25 NOTE — Progress Notes (Signed)
I connected by phone with [name]. on 09/25/2020, 12:16 PM to discuss the potential use of a new treatment, tixagevimab/cilgavimab, for pre-exposure prophylaxis for prevention of coronavirus disease 2019 (COVID-19) caused by the SARS-CoV-2 virus.  This patient is a 75 y.o. male that meets the FDA criteria for Emergency Use Authorization of tixagevimab/cilgavimab for pre-exposure prophylaxis of COVID-19 disease. Pt meets following criteria:  Age >12 yr and weight > 40kg  Not currently infected with SARS-CoV-2 and has no known recent exposure to an individual infected with SARS-CoV-2 AND o Who has moderate to severe immune compromise due to a medical condition or receipt of immunosuppressive medications or treatments and may not mount an adequate immune response to COVID-19 vaccination or  o Vaccination with any available COVID-19 vaccine, according to the approved or authorized schedule, is not recommended due to a history of severe adverse reaction (e.g., severe allergic reaction) to a COVID-19 vaccine(s) and/or COVID-19 vaccine component(s).  o Patient meets the following definition of mod-severe immune compromised status: 4. Lung transplants, other solid organ transplant recipients on continual belatacept therapy, or multiple myeloma (actively receiving treatment)  I have spoken and communicated the following to the patient or parent/caregiver regarding COVID monoclonal antibody treatment:  1. FDA has authorized the emergency use of tixagevimab/cilgavimab for the pre-exposure prophylaxis of COVID-19 in patients with moderate-severe immunocompromised status, who meet above EUA criteria.  2. The significant known and potential risks and benefits of COVID monoclonal antibody, and the extent to which such potential risks and benefits are unknown.  3. Information on available alternative treatments and the risks and benefits of those alternatives, including clinical trials.  4. The patient  or parent/caregiver has the option to accept or refuse COVID monoclonal antibody treatment.  After reviewing this information with the patient, agree to receive tixagevimab/cilgavimab.  I have arranged this to be completed on 10/10/2020 when he receives his infusion, as the updated dose of 323m is available beginning 10/09/2020.  [name] NP, 09/25/2020, 12:16 PM

## 2020-09-26 DIAGNOSIS — G893 Neoplasm related pain (acute) (chronic): Secondary | ICD-10-CM | POA: Diagnosis not present

## 2020-09-26 DIAGNOSIS — Z9484 Stem cells transplant status: Secondary | ICD-10-CM | POA: Diagnosis not present

## 2020-09-26 DIAGNOSIS — G629 Polyneuropathy, unspecified: Secondary | ICD-10-CM | POA: Diagnosis not present

## 2020-09-26 DIAGNOSIS — Z7901 Long term (current) use of anticoagulants: Secondary | ICD-10-CM | POA: Diagnosis not present

## 2020-09-26 DIAGNOSIS — I129 Hypertensive chronic kidney disease with stage 1 through stage 4 chronic kidney disease, or unspecified chronic kidney disease: Secondary | ICD-10-CM | POA: Diagnosis not present

## 2020-09-26 DIAGNOSIS — N183 Chronic kidney disease, stage 3 unspecified: Secondary | ICD-10-CM | POA: Diagnosis not present

## 2020-09-26 DIAGNOSIS — Z79899 Other long term (current) drug therapy: Secondary | ICD-10-CM | POA: Diagnosis not present

## 2020-09-26 DIAGNOSIS — Z23 Encounter for immunization: Secondary | ICD-10-CM | POA: Diagnosis not present

## 2020-09-26 DIAGNOSIS — I440 Atrioventricular block, first degree: Secondary | ICD-10-CM | POA: Diagnosis not present

## 2020-09-26 DIAGNOSIS — G8929 Other chronic pain: Secondary | ICD-10-CM | POA: Diagnosis not present

## 2020-09-26 DIAGNOSIS — R0609 Other forms of dyspnea: Secondary | ICD-10-CM | POA: Diagnosis not present

## 2020-09-26 DIAGNOSIS — C9000 Multiple myeloma not having achieved remission: Secondary | ICD-10-CM | POA: Diagnosis not present

## 2020-09-26 DIAGNOSIS — M549 Dorsalgia, unspecified: Secondary | ICD-10-CM | POA: Diagnosis not present

## 2020-09-26 DIAGNOSIS — C9001 Multiple myeloma in remission: Secondary | ICD-10-CM | POA: Diagnosis not present

## 2020-09-26 DIAGNOSIS — G4700 Insomnia, unspecified: Secondary | ICD-10-CM | POA: Diagnosis not present

## 2020-09-26 DIAGNOSIS — Z86718 Personal history of other venous thrombosis and embolism: Secondary | ICD-10-CM | POA: Diagnosis not present

## 2020-09-27 ENCOUNTER — Other Ambulatory Visit: Payer: Self-pay | Admitting: *Deleted

## 2020-09-27 ENCOUNTER — Encounter: Payer: Self-pay | Admitting: *Deleted

## 2020-09-27 DIAGNOSIS — C9000 Multiple myeloma not having achieved remission: Secondary | ICD-10-CM

## 2020-09-27 DIAGNOSIS — I82419 Acute embolism and thrombosis of unspecified femoral vein: Secondary | ICD-10-CM

## 2020-09-27 DIAGNOSIS — M5416 Radiculopathy, lumbar region: Secondary | ICD-10-CM | POA: Diagnosis not present

## 2020-09-27 MED ORDER — APIXABAN 5 MG PO TABS: ORAL_TABLET | ORAL | 0 refills | Status: DC

## 2020-09-27 MED ORDER — FOLIC ACID 1 MG PO TABS: 1.0000 mg | ORAL_TABLET | Freq: Every day | ORAL | 0 refills | Status: DC

## 2020-09-27 NOTE — Progress Notes (Signed)
Reviewed immunizations.

## 2020-09-27 NOTE — Telephone Encounter (Signed)
Received faxed refill request from [facility] for 90 day Rx for Eliquis 5 mg BID and Folic Acid tab/daily. Per Dr. Irene Limbo, ok to fill for 90 days

## 2020-09-29 ENCOUNTER — Encounter: Payer: Self-pay | Admitting: Hematology

## 2020-10-02 DIAGNOSIS — M5416 Radiculopathy, lumbar region: Secondary | ICD-10-CM | POA: Diagnosis not present

## 2020-10-03 ENCOUNTER — Other Ambulatory Visit: Payer: Medicare Other

## 2020-10-03 ENCOUNTER — Ambulatory Visit: Payer: Medicare Other
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
## 2020-10-04 ENCOUNTER — Inpatient Hospital Stay: Payer: Medicare Other

## 2020-10-04 ENCOUNTER — Other Ambulatory Visit: Payer: Self-pay

## 2020-10-04 ENCOUNTER — Inpatient Hospital Stay: Payer: Medicare Other | Admitting: Hematology

## 2020-10-04 VITALS — BP 104/70 | HR 67 | Temp 97.1°F | Resp 18 | Wt 193.8 lb

## 2020-10-04 DIAGNOSIS — Z5112 Encounter for antineoplastic immunotherapy: Secondary | ICD-10-CM | POA: Diagnosis not present

## 2020-10-04 DIAGNOSIS — Z5111 Encounter for antineoplastic chemotherapy: Secondary | ICD-10-CM

## 2020-10-04 DIAGNOSIS — Z79899 Other long term (current) drug therapy: Secondary | ICD-10-CM | POA: Diagnosis not present

## 2020-10-04 DIAGNOSIS — Z95828 Presence of other vascular implants and grafts: Secondary | ICD-10-CM

## 2020-10-04 DIAGNOSIS — Z298 Encounter for other specified prophylactic measures: Secondary | ICD-10-CM | POA: Diagnosis not present

## 2020-10-04 DIAGNOSIS — C7951 Secondary malignant neoplasm of bone: Secondary | ICD-10-CM | POA: Diagnosis not present

## 2020-10-04 DIAGNOSIS — Z7189 Other specified counseling: Secondary | ICD-10-CM

## 2020-10-04 DIAGNOSIS — Z9221 Personal history of antineoplastic chemotherapy: Secondary | ICD-10-CM | POA: Diagnosis not present

## 2020-10-04 DIAGNOSIS — C9000 Multiple myeloma not having achieved remission: Secondary | ICD-10-CM | POA: Diagnosis not present

## 2020-10-04 LAB — CBC WITH DIFFERENTIAL/PLATELET
Abs Immature Granulocytes: 0.01 10*3/uL (ref 0.00–0.07)
Basophils Absolute: 0 10*3/uL (ref 0.0–0.1)
Basophils Relative: 1 %
Eosinophils Absolute: 0.1 10*3/uL (ref 0.0–0.5)
Eosinophils Relative: 3 %
HCT: 33.8 % — ABNORMAL LOW (ref 39.0–52.0)
Hemoglobin: 11.5 g/dL — ABNORMAL LOW (ref 13.0–17.0)
Immature Granulocytes: 0 %
Lymphocytes Relative: 28 %
Lymphs Abs: 1 10*3/uL (ref 0.7–4.0)
MCH: 35 pg — ABNORMAL HIGH (ref 26.0–34.0)
MCHC: 34 g/dL (ref 30.0–36.0)
MCV: 102.7 fL — ABNORMAL HIGH (ref 80.0–100.0)
Monocytes Absolute: 0.5 10*3/uL (ref 0.1–1.0)
Monocytes Relative: 13 %
Neutro Abs: 1.9 10*3/uL (ref 1.7–7.7)
Neutrophils Relative %: 55 %
Platelets: 168 10*3/uL (ref 150–400)
RBC: 3.29 MIL/uL — ABNORMAL LOW (ref 4.22–5.81)
RDW: 15.4 % (ref 11.5–15.5)
WBC: 3.5 10*3/uL — ABNORMAL LOW (ref 4.0–10.5)
nRBC: 0 % (ref 0.0–0.2)

## 2020-10-04 LAB — CMP (CANCER CENTER ONLY)
ALT: 17 U/L (ref 0–44)
AST: 28 U/L (ref 15–41)
Albumin: 3.9 g/dL (ref 3.5–5.0)
Alkaline Phosphatase: 61 U/L (ref 38–126)
Anion gap: 8 (ref 5–15)
BUN: 17 mg/dL (ref 8–23)
CO2: 24 mmol/L (ref 22–32)
Calcium: 9.3 mg/dL (ref 8.9–10.3)
Chloride: 106 mmol/L (ref 98–111)
Creatinine: 1.42 mg/dL — ABNORMAL HIGH (ref 0.61–1.24)
GFR, Estimated: 52 mL/min — ABNORMAL LOW (ref >60)
Glucose, Bld: 121 mg/dL — ABNORMAL HIGH (ref 70–99)
Potassium: 3.9 mmol/L (ref 3.5–5.1)
Sodium: 138 mmol/L (ref 135–145)
Total Bilirubin: 0.8 mg/dL (ref 0.3–1.2)
Total Protein: 6 g/dL — ABNORMAL LOW (ref 6.5–8.1)

## 2020-10-04 MED ORDER — SODIUM CHLORIDE 0.9 % IV SOLN
20.0000 mg | Freq: Once | INTRAVENOUS | Status: AC
  Administered 2020-10-04: 20 mg via INTRAVENOUS
  Filled 2020-10-04: qty 2

## 2020-10-04 MED ORDER — PROCHLORPERAZINE MALEATE 10 MG PO TABS
ORAL_TABLET | ORAL | Status: AC
  Filled 2020-10-04: qty 1

## 2020-10-04 MED ORDER — SODIUM CHLORIDE 0.9 % IV SOLN
Freq: Once | INTRAVENOUS | Status: AC
  Filled 2020-10-04: qty 250

## 2020-10-04 MED ORDER — PROCHLORPERAZINE MALEATE 10 MG PO TABS
10.0000 mg | ORAL_TABLET | Freq: Once | ORAL | Status: AC
  Administered 2020-10-04: 10 mg via ORAL

## 2020-10-04 MED ORDER — SODIUM CHLORIDE 0.9% FLUSH
10.0000 mL | Freq: Once | INTRAVENOUS | Status: AC
  Administered 2020-10-04: 10 mL
  Filled 2020-10-04: qty 10

## 2020-10-04 MED ORDER — HEPARIN SOD (PORK) LOCK FLUSH 100 UNIT/ML IV SOLN
500.0000 [IU] | Freq: Once | INTRAVENOUS | Status: AC | PRN
  Administered 2020-10-04: 500 [IU]
  Filled 2020-10-04: qty 5

## 2020-10-04 MED ORDER — DEXTROSE 5 % IV SOLN
56.0000 mg/m2 | Freq: Once | INTRAVENOUS | Status: AC
  Administered 2020-10-04: 120 mg via INTRAVENOUS
  Filled 2020-10-04: qty 60

## 2020-10-04 MED ORDER — SODIUM CHLORIDE 0.9% FLUSH
10.0000 mL | INTRAVENOUS | Status: DC | PRN
  Administered 2020-10-04: 10 mL
  Filled 2020-10-04: qty 10

## 2020-10-05 ENCOUNTER — Telehealth: Payer: Self-pay | Admitting: *Deleted

## 2020-10-05 ENCOUNTER — Encounter: Payer: Self-pay | Admitting: *Deleted

## 2020-10-05 NOTE — Telephone Encounter (Signed)
Contacted by [facility]. Their records show patient to be on Eliquis 5mg  po BID (prescribed by Dr. Irene Limbo) and Plavix 75 mg po daily, prescribed by Dr.Yan. The pharmacy requests clarification about these medications. Patient contacted - he states Dr. Krista Blue d/c'd  Plavix January 2021. [name] states he only takes Eliquis [facility], spoke with Laurena Spies, Pharmacist. Gave them the information provided by patient. Pharmacist verbalized understanding and stated a note would be made on patient profile

## 2020-10-06 ENCOUNTER — Telehealth: Payer: Self-pay | Admitting: Hematology

## 2020-10-06 DIAGNOSIS — M5416 Radiculopathy, lumbar region: Secondary | ICD-10-CM | POA: Diagnosis not present

## 2020-10-06 NOTE — Telephone Encounter (Signed)
Moved upcoming appointment and increased infusion time per 3/16 los. Patient is aware.

## 2020-10-08 ENCOUNTER — Other Ambulatory Visit: Payer: Self-pay | Admitting: Physician Assistant

## 2020-10-08 DIAGNOSIS — C9000 Multiple myeloma not having achieved remission: Secondary | ICD-10-CM

## 2020-10-08 NOTE — Progress Notes (Signed)
I connected by phone with [name]. on 10/08/2020, 12:52 PM to discuss the potential use of a new treatment, tixagevimab/cilgavimab, for pre-exposure prophylaxis for prevention of coronavirus disease 2019 (COVID-19) caused by the SARS-CoV-2 virus.  This patient is a 74 y.o. male that meets the FDA criteria for Emergency Use Authorization of tixagevimab/cilgavimab for pre-exposure prophylaxis of COVID-19 disease. Pt meets following criteria:  Age >12 yr and weight > 40kg  Not currently infected with SARS-CoV-2 and has no known recent exposure to an individual infected with SARS-CoV-2 AND o Who has moderate to severe immune compromise due to a medical condition or receipt of immunosuppressive medications or treatments and may not mount an adequate immune response to COVID-19 vaccination or  o Vaccination with any available COVID-19 vaccine, according to the approved or authorized schedule, is not recommended due to a history of severe adverse reaction (e.g., severe allergic reaction) to a COVID-19 vaccine(s) and/or COVID-19 vaccine component(s).  o Patient meets the following definition of mod-severe immune compromised status: 6. Other actively treated hematologic malignancies or severe congenital immunodeficiency syndromes  I have spoken and communicated the following to the patient or parent/caregiver regarding COVID monoclonal antibody treatment:  1. FDA has authorized the emergency use of tixagevimab/cilgavimab for the pre-exposure prophylaxis of COVID-19 in patients with moderate-severe immunocompromised status, who meet above EUA criteria.  2. The significant known and potential risks and benefits of COVID monoclonal antibody, and the extent to which such potential risks and benefits are unknown.  3. Information on available alternative treatments and the risks and benefits of those alternatives, including clinical trials.  4. The patient or parent/caregiver has the option to accept  or refuse COVID monoclonal antibody treatment.  After reviewing this information with the patient, agree to receive tixagevimab/cilgavimab  Angelena Form, PA-C, 10/08/2020, 12:52 PM

## 2020-10-09 DIAGNOSIS — M5416 Radiculopathy, lumbar region: Secondary | ICD-10-CM | POA: Diagnosis not present

## 2020-10-10 ENCOUNTER — Other Ambulatory Visit: Payer: Self-pay

## 2020-10-10 ENCOUNTER — Inpatient Hospital Stay: Payer: Medicare Other

## 2020-10-10 ENCOUNTER — Other Ambulatory Visit: Payer: Medicare Other

## 2020-10-10 VITALS — BP 120/80 | HR 68 | Temp 97.2°F | Resp 17 | Wt 193.8 lb

## 2020-10-10 DIAGNOSIS — Z5111 Encounter for antineoplastic chemotherapy: Secondary | ICD-10-CM

## 2020-10-10 DIAGNOSIS — Z95828 Presence of other vascular implants and grafts: Secondary | ICD-10-CM

## 2020-10-10 DIAGNOSIS — Z5112 Encounter for antineoplastic immunotherapy: Secondary | ICD-10-CM | POA: Diagnosis not present

## 2020-10-10 DIAGNOSIS — Z9221 Personal history of antineoplastic chemotherapy: Secondary | ICD-10-CM | POA: Diagnosis not present

## 2020-10-10 DIAGNOSIS — Z79899 Other long term (current) drug therapy: Secondary | ICD-10-CM | POA: Diagnosis not present

## 2020-10-10 DIAGNOSIS — C9000 Multiple myeloma not having achieved remission: Secondary | ICD-10-CM | POA: Diagnosis not present

## 2020-10-10 DIAGNOSIS — Z298 Encounter for other specified prophylactic measures: Secondary | ICD-10-CM | POA: Diagnosis not present

## 2020-10-10 DIAGNOSIS — Z7189 Other specified counseling: Secondary | ICD-10-CM

## 2020-10-10 LAB — CBC WITH DIFFERENTIAL/PLATELET
Abs Immature Granulocytes: 0.01 10*3/uL (ref 0.00–0.07)
Basophils Absolute: 0 10*3/uL (ref 0.0–0.1)
Basophils Relative: 0 %
Eosinophils Absolute: 0.1 10*3/uL (ref 0.0–0.5)
Eosinophils Relative: 2 %
HCT: 34.5 % — ABNORMAL LOW (ref 39.0–52.0)
Hemoglobin: 11.8 g/dL — ABNORMAL LOW (ref 13.0–17.0)
Immature Granulocytes: 0 %
Lymphocytes Relative: 28 %
Lymphs Abs: 1.2 10*3/uL (ref 0.7–4.0)
MCH: 35.1 pg — ABNORMAL HIGH (ref 26.0–34.0)
MCHC: 34.2 g/dL (ref 30.0–36.0)
MCV: 102.7 fL — ABNORMAL HIGH (ref 80.0–100.0)
Monocytes Absolute: 0.7 10*3/uL (ref 0.1–1.0)
Monocytes Relative: 16 %
Neutro Abs: 2.4 10*3/uL (ref 1.7–7.7)
Neutrophils Relative %: 54 %
Platelets: 101 10*3/uL — ABNORMAL LOW (ref 150–400)
RBC: 3.36 MIL/uL — ABNORMAL LOW (ref 4.22–5.81)
RDW: 15 % (ref 11.5–15.5)
WBC: 4.3 10*3/uL (ref 4.0–10.5)
nRBC: 0 % (ref 0.0–0.2)

## 2020-10-10 LAB — CMP (CANCER CENTER ONLY)
ALT: 17 U/L (ref 0–44)
AST: 23 U/L (ref 15–41)
Albumin: 3.8 g/dL (ref 3.5–5.0)
Alkaline Phosphatase: 58 U/L (ref 38–126)
Anion gap: 10 (ref 5–15)
BUN: 12 mg/dL (ref 8–23)
CO2: 25 mmol/L (ref 22–32)
Calcium: 9.2 mg/dL (ref 8.9–10.3)
Chloride: 104 mmol/L (ref 98–111)
Creatinine: 1.34 mg/dL — ABNORMAL HIGH (ref 0.61–1.24)
GFR, Estimated: 56 mL/min — ABNORMAL LOW (ref >60)
Glucose, Bld: 116 mg/dL — ABNORMAL HIGH (ref 70–99)
Potassium: 4.2 mmol/L (ref 3.5–5.1)
Sodium: 139 mmol/L (ref 135–145)
Total Bilirubin: 0.8 mg/dL (ref 0.3–1.2)
Total Protein: 5.9 g/dL — ABNORMAL LOW (ref 6.5–8.1)

## 2020-10-10 MED ORDER — SODIUM CHLORIDE 0.9 % IV SOLN
20.0000 mg | Freq: Once | INTRAVENOUS | Status: AC
  Administered 2020-10-10: 20 mg via INTRAVENOUS
  Filled 2020-10-10: qty 20

## 2020-10-10 MED ORDER — PROCHLORPERAZINE MALEATE 10 MG PO TABS
ORAL_TABLET | ORAL | Status: AC
  Filled 2020-10-10: qty 1

## 2020-10-10 MED ORDER — SODIUM CHLORIDE 0.9% FLUSH
10.0000 mL | Freq: Once | INTRAVENOUS | Status: AC
Start: 2020-10-10 — End: 2020-10-10
  Administered 2020-10-10: 10 mL
  Filled 2020-10-10: qty 10

## 2020-10-10 MED ORDER — TIXAGEVIMAB (PART OF EVUSHELD) INJECTION
300.0000 mg | Freq: Once | INTRAMUSCULAR | Status: AC
  Administered 2020-10-10: 300 mg via INTRAMUSCULAR
  Filled 2020-10-10: qty 3

## 2020-10-10 MED ORDER — PROCHLORPERAZINE MALEATE 10 MG PO TABS
10.0000 mg | ORAL_TABLET | Freq: Once | ORAL | Status: AC
  Administered 2020-10-10: 10 mg via ORAL

## 2020-10-10 MED ORDER — DEXTROSE 5 % IV SOLN
56.0000 mg/m2 | Freq: Once | INTRAVENOUS | Status: AC
  Administered 2020-10-10: 120 mg via INTRAVENOUS
  Filled 2020-10-10: qty 60

## 2020-10-10 MED ORDER — SODIUM CHLORIDE 0.9 % IV SOLN
Freq: Once | INTRAVENOUS | Status: AC
  Filled 2020-10-10: qty 250

## 2020-10-10 MED ORDER — CILGAVIMAB (PART OF EVUSHELD) INJECTION
300.0000 mg | Freq: Once | INTRAMUSCULAR | Status: AC
  Administered 2020-10-10: 300 mg via INTRAMUSCULAR
  Filled 2020-10-10: qty 3

## 2020-10-10 NOTE — Patient Instructions (Signed)
[facility] Discharge Instructions for Patients Receiving Chemotherapy  Today you received the following chemotherapy agents:  Kyprolis  To help prevent nausea and vomiting after your treatment, we encourage you to take your nausea medication as directed.   If you develop nausea and vomiting that is not controlled by your nausea medication, call the clinic.   BELOW ARE SYMPTOMS THAT SHOULD BE REPORTED IMMEDIATELY:  *FEVER GREATER THAN 100.5 F  *CHILLS WITH OR WITHOUT FEVER  NAUSEA AND VOMITING THAT IS NOT CONTROLLED WITH YOUR NAUSEA MEDICATION  *UNUSUAL SHORTNESS OF BREATH  *UNUSUAL BRUISING OR BLEEDING  TENDERNESS IN MOUTH AND THROAT WITH OR WITHOUT PRESENCE OF ULCERS  *URINARY PROBLEMS  *BOWEL PROBLEMS  UNUSUAL RASH Items with * indicate a potential emergency and should be followed up as soon as possible.  Feel free to call the clinic should you have any questions or concerns. The clinic phone number is [phone].  Please show the CHEMO ALERT CARD at check-in to the Emergency Department and triage nurse.   

## 2020-10-13 DIAGNOSIS — M5416 Radiculopathy, lumbar region: Secondary | ICD-10-CM | POA: Diagnosis not present

## 2020-10-16 DIAGNOSIS — M5416 Radiculopathy, lumbar region: Secondary | ICD-10-CM | POA: Diagnosis not present

## 2020-10-17 ENCOUNTER — Inpatient Hospital Stay: Payer: Medicare Other

## 2020-10-17 ENCOUNTER — Other Ambulatory Visit: Payer: Self-pay

## 2020-10-17 ENCOUNTER — Other Ambulatory Visit: Payer: Medicare Other

## 2020-10-17 VITALS — BP 94/73 | HR 92 | Temp 98.5°F | Resp 18 | Wt 193.5 lb

## 2020-10-17 DIAGNOSIS — Z298 Encounter for other specified prophylactic measures: Secondary | ICD-10-CM | POA: Diagnosis not present

## 2020-10-17 DIAGNOSIS — C9000 Multiple myeloma not having achieved remission: Secondary | ICD-10-CM | POA: Diagnosis not present

## 2020-10-17 DIAGNOSIS — Z5111 Encounter for antineoplastic chemotherapy: Secondary | ICD-10-CM

## 2020-10-17 DIAGNOSIS — Z5112 Encounter for antineoplastic immunotherapy: Secondary | ICD-10-CM | POA: Diagnosis not present

## 2020-10-17 DIAGNOSIS — Z7189 Other specified counseling: Secondary | ICD-10-CM

## 2020-10-17 DIAGNOSIS — Z9221 Personal history of antineoplastic chemotherapy: Secondary | ICD-10-CM | POA: Diagnosis not present

## 2020-10-17 DIAGNOSIS — Z79899 Other long term (current) drug therapy: Secondary | ICD-10-CM | POA: Diagnosis not present

## 2020-10-17 LAB — CBC WITH DIFFERENTIAL/PLATELET
Abs Immature Granulocytes: 0.01 10*3/uL (ref 0.00–0.07)
Basophils Absolute: 0 10*3/uL (ref 0.0–0.1)
Basophils Relative: 0 %
Eosinophils Absolute: 0.1 10*3/uL (ref 0.0–0.5)
Eosinophils Relative: 2 %
HCT: 33.9 % — ABNORMAL LOW (ref 39.0–52.0)
Hemoglobin: 11.5 g/dL — ABNORMAL LOW (ref 13.0–17.0)
Immature Granulocytes: 0 %
Lymphocytes Relative: 20 %
Lymphs Abs: 1 10*3/uL (ref 0.7–4.0)
MCH: 35.5 pg — ABNORMAL HIGH (ref 26.0–34.0)
MCHC: 33.9 g/dL (ref 30.0–36.0)
MCV: 104.6 fL — ABNORMAL HIGH (ref 80.0–100.0)
Monocytes Absolute: 0.6 10*3/uL (ref 0.1–1.0)
Monocytes Relative: 13 %
Neutro Abs: 3.1 10*3/uL (ref 1.7–7.7)
Neutrophils Relative %: 65 %
Platelets: 130 10*3/uL — ABNORMAL LOW (ref 150–400)
RBC: 3.24 MIL/uL — ABNORMAL LOW (ref 4.22–5.81)
RDW: 14.9 % (ref 11.5–15.5)
WBC: 4.7 10*3/uL (ref 4.0–10.5)
nRBC: 0 % (ref 0.0–0.2)

## 2020-10-17 LAB — CMP (CANCER CENTER ONLY)
ALT: 17 U/L (ref 0–44)
AST: 25 U/L (ref 15–41)
Albumin: 3.9 g/dL (ref 3.5–5.0)
Alkaline Phosphatase: 69 U/L (ref 38–126)
Anion gap: 9 (ref 5–15)
BUN: 14 mg/dL (ref 8–23)
CO2: 25 mmol/L (ref 22–32)
Calcium: 9 mg/dL (ref 8.9–10.3)
Chloride: 105 mmol/L (ref 98–111)
Creatinine: 1.24 mg/dL (ref 0.61–1.24)
GFR, Estimated: >60 mL/min (ref >60)
Glucose, Bld: 89 mg/dL (ref 70–99)
Potassium: 4.3 mmol/L (ref 3.5–5.1)
Sodium: 139 mmol/L (ref 135–145)
Total Bilirubin: 0.8 mg/dL (ref 0.3–1.2)
Total Protein: 5.9 g/dL — ABNORMAL LOW (ref 6.5–8.1)

## 2020-10-17 MED ORDER — SODIUM CHLORIDE 0.9% FLUSH
10.0000 mL | INTRAVENOUS | Status: DC | PRN
  Administered 2020-10-17: 10 mL
  Filled 2020-10-17: qty 10

## 2020-10-17 MED ORDER — SODIUM CHLORIDE 0.9 % IV SOLN
Freq: Once | INTRAVENOUS | Status: AC
  Filled 2020-10-17: qty 250

## 2020-10-17 MED ORDER — HEPARIN SOD (PORK) LOCK FLUSH 100 UNIT/ML IV SOLN
500.0000 [IU] | Freq: Once | INTRAVENOUS | Status: AC | PRN
  Administered 2020-10-17: 500 [IU]
  Filled 2020-10-17: qty 5

## 2020-10-17 MED ORDER — DEXTROSE 5 % IV SOLN
56.0000 mg/m2 | Freq: Once | INTRAVENOUS | Status: AC
  Administered 2020-10-17: 120 mg via INTRAVENOUS
  Filled 2020-10-17: qty 60

## 2020-10-17 MED ORDER — SODIUM CHLORIDE 0.9 % IV SOLN
20.0000 mg | Freq: Once | INTRAVENOUS | Status: AC
  Administered 2020-10-17: 20 mg via INTRAVENOUS
  Filled 2020-10-17: qty 20

## 2020-10-17 MED ORDER — PROCHLORPERAZINE MALEATE 10 MG PO TABS
10.0000 mg | ORAL_TABLET | Freq: Once | ORAL | Status: AC
  Administered 2020-10-17: 10 mg via ORAL

## 2020-10-17 MED ORDER — PROCHLORPERAZINE MALEATE 10 MG PO TABS
ORAL_TABLET | ORAL | Status: AC
  Filled 2020-10-17: qty 1

## 2020-10-18 ENCOUNTER — Other Ambulatory Visit: Payer: Self-pay | Admitting: Hematology

## 2020-10-18 DIAGNOSIS — M5416 Radiculopathy, lumbar region: Secondary | ICD-10-CM | POA: Diagnosis not present

## 2020-10-18 NOTE — Telephone Encounter (Signed)
Please see refill request.

## 2020-10-25 DIAGNOSIS — M5416 Radiculopathy, lumbar region: Secondary | ICD-10-CM | POA: Diagnosis not present
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
## 2020-10-31 ENCOUNTER — Other Ambulatory Visit: Payer: Self-pay

## 2020-10-31 ENCOUNTER — Inpatient Hospital Stay: Payer: Medicare Other

## 2020-10-31 ENCOUNTER — Inpatient Hospital Stay: Payer: Medicare Other | Attending: Hematology

## 2020-10-31 ENCOUNTER — Inpatient Hospital Stay: Payer: Medicare Other | Admitting: Hematology

## 2020-10-31 VITALS — BP 110/67 | HR 71 | Temp 97.6°F | Resp 17 | Ht 71.0 in | Wt 192.8 lb

## 2020-10-31 DIAGNOSIS — C9000 Multiple myeloma not having achieved remission: Secondary | ICD-10-CM | POA: Diagnosis not present

## 2020-10-31 DIAGNOSIS — Z7189 Other specified counseling: Secondary | ICD-10-CM

## 2020-10-31 DIAGNOSIS — Z5112 Encounter for antineoplastic immunotherapy: Secondary | ICD-10-CM | POA: Insufficient documentation

## 2020-10-31 DIAGNOSIS — C9001 Multiple myeloma in remission: Secondary | ICD-10-CM

## 2020-10-31 DIAGNOSIS — Z5111 Encounter for antineoplastic chemotherapy: Secondary | ICD-10-CM

## 2020-10-31 DIAGNOSIS — Z79899 Other long term (current) drug therapy: Secondary | ICD-10-CM | POA: Insufficient documentation

## 2020-10-31 LAB — CBC WITH DIFFERENTIAL/PLATELET
Abs Immature Granulocytes: 0.01 10*3/uL (ref 0.00–0.07)
Basophils Absolute: 0 10*3/uL (ref 0.0–0.1)
Basophils Relative: 1 %
Eosinophils Absolute: 0.1 10*3/uL (ref 0.0–0.5)
Eosinophils Relative: 2 %
HCT: 34.9 % — ABNORMAL LOW (ref 39.0–52.0)
Hemoglobin: 11.7 g/dL — ABNORMAL LOW (ref 13.0–17.0)
Immature Granulocytes: 0 %
Lymphocytes Relative: 32 %
Lymphs Abs: 1.4 10*3/uL (ref 0.7–4.0)
MCH: 36 pg — ABNORMAL HIGH (ref 26.0–34.0)
MCHC: 33.5 g/dL (ref 30.0–36.0)
MCV: 107.4 fL — ABNORMAL HIGH (ref 80.0–100.0)
Monocytes Absolute: 0.5 10*3/uL (ref 0.1–1.0)
Monocytes Relative: 12 %
Neutro Abs: 2.3 10*3/uL (ref 1.7–7.7)
Neutrophils Relative %: 53 %
Platelets: 193 10*3/uL (ref 150–400)
RBC: 3.25 MIL/uL — ABNORMAL LOW (ref 4.22–5.81)
RDW: 14.2 % (ref 11.5–15.5)
WBC: 4.2 10*3/uL (ref 4.0–10.5)
nRBC: 0 % (ref 0.0–0.2)

## 2020-10-31 LAB — CMP (CANCER CENTER ONLY)
ALT: 16 U/L (ref 0–44)
AST: 27 U/L (ref 15–41)
Albumin: 4 g/dL (ref 3.5–5.0)
Alkaline Phosphatase: 68 U/L (ref 38–126)
Anion gap: 12 (ref 5–15)
BUN: 16 mg/dL (ref 8–23)
CO2: 24 mmol/L (ref 22–32)
Calcium: 9.3 mg/dL (ref 8.9–10.3)
Chloride: 105 mmol/L (ref 98–111)
Creatinine: 1.33 mg/dL — ABNORMAL HIGH (ref 0.61–1.24)
GFR, Estimated: 56 mL/min — ABNORMAL LOW (ref >60)
Glucose, Bld: 97 mg/dL (ref 70–99)
Potassium: 4.4 mmol/L (ref 3.5–5.1)
Sodium: 141 mmol/L (ref 135–145)
Total Bilirubin: 0.8 mg/dL (ref 0.3–1.2)
Total Protein: 6 g/dL — ABNORMAL LOW (ref 6.5–8.1)

## 2020-10-31 MED ORDER — SODIUM CHLORIDE 0.9 % IV SOLN
Freq: Once | INTRAVENOUS | Status: AC
  Filled 2020-10-31: qty 250

## 2020-10-31 MED ORDER — PROCHLORPERAZINE MALEATE 10 MG PO TABS
ORAL_TABLET | ORAL | Status: AC
  Filled 2020-10-31: qty 1

## 2020-10-31 MED ORDER — DEXTROSE 5 % IV SOLN
56.0000 mg/m2 | Freq: Once | INTRAVENOUS | Status: AC
  Administered 2020-10-31: 120 mg via INTRAVENOUS
  Filled 2020-10-31: qty 60

## 2020-10-31 MED ORDER — SODIUM CHLORIDE 0.9 % IV SOLN
20.0000 mg | Freq: Once | INTRAVENOUS | Status: AC
  Administered 2020-10-31: 20 mg via INTRAVENOUS
  Filled 2020-10-31: qty 20

## 2020-10-31 MED ORDER — SODIUM CHLORIDE 0.9% FLUSH
10.0000 mL | INTRAVENOUS | Status: DC | PRN
  Administered 2020-10-31: 10 mL
  Filled 2020-10-31: qty 10

## 2020-10-31 MED ORDER — PROCHLORPERAZINE MALEATE 10 MG PO TABS
10.0000 mg | ORAL_TABLET | Freq: Once | ORAL | Status: AC
Start: 2020-10-31 — End: 2020-10-31
  Administered 2020-10-31: 10 mg via ORAL

## 2020-10-31 MED ORDER — HEPARIN SOD (PORK) LOCK FLUSH 100 UNIT/ML IV SOLN
500.0000 [IU] | Freq: Once | INTRAVENOUS | Status: AC | PRN
  Administered 2020-10-31: 500 [IU]
  Filled 2020-10-31: qty 5

## 2020-10-31 NOTE — Patient Instructions (Signed)

## 2020-10-31 NOTE — Patient Instructions (Signed)
[facility] Discharge Instructions for Patients Receiving Chemotherapy  Today you received the following chemotherapy agents:  Kyprolis  To help prevent nausea and vomiting after your treatment, we encourage you to take your nausea medication as directed.   If you develop nausea and vomiting that is not controlled by your nausea medication, call the clinic.   BELOW ARE SYMPTOMS THAT SHOULD BE REPORTED IMMEDIATELY:  *FEVER GREATER THAN 100.5 F  *CHILLS WITH OR WITHOUT FEVER  NAUSEA AND VOMITING THAT IS NOT CONTROLLED WITH YOUR NAUSEA MEDICATION  *UNUSUAL SHORTNESS OF BREATH  *UNUSUAL BRUISING OR BLEEDING  TENDERNESS IN MOUTH AND THROAT WITH OR WITHOUT PRESENCE OF ULCERS  *URINARY PROBLEMS  *BOWEL PROBLEMS  UNUSUAL RASH Items with * indicate a potential emergency and should be followed up as soon as possible.  Feel free to call the clinic should you have any questions or concerns. The clinic phone number is [phone].  Please show the CHEMO ALERT CARD at check-in to the Emergency Department and triage nurse.   

## 2020-11-03 DIAGNOSIS — M5416 Radiculopathy, lumbar region: Secondary | ICD-10-CM | POA: Diagnosis not present

## 2020-11-06 ENCOUNTER — Telehealth: Payer: Self-pay | Admitting: Hematology

## 2020-11-06 NOTE — Telephone Encounter (Signed)
Scheduled follow-up appointments per 4/12 los. Patient is aware. 

## 2020-11-07 ENCOUNTER — Other Ambulatory Visit: Payer: Self-pay

## 2020-11-07 ENCOUNTER — Inpatient Hospital Stay: Payer: Medicare Other

## 2020-11-07 ENCOUNTER — Other Ambulatory Visit: Payer: Medicare Other

## 2020-11-07 VITALS — BP 115/76 | HR 79 | Temp 98.1°F | Resp 18

## 2020-11-07 DIAGNOSIS — C9000 Multiple myeloma not having achieved remission: Secondary | ICD-10-CM

## 2020-11-07 DIAGNOSIS — Z79899 Other long term (current) drug therapy: Secondary | ICD-10-CM | POA: Diagnosis not present

## 2020-11-07 DIAGNOSIS — Z5111 Encounter for antineoplastic chemotherapy: Secondary | ICD-10-CM

## 2020-11-07 DIAGNOSIS — Z5112 Encounter for antineoplastic immunotherapy: Secondary | ICD-10-CM | POA: Diagnosis not present

## 2020-11-07 DIAGNOSIS — Z7189 Other specified counseling: Secondary | ICD-10-CM

## 2020-11-07 LAB — CMP (CANCER CENTER ONLY)
ALT: 17 U/L (ref 0–44)
AST: 23 U/L (ref 15–41)
Albumin: 4.1 g/dL (ref 3.5–5.0)
Alkaline Phosphatase: 68 U/L (ref 38–126)
Anion gap: 10 (ref 5–15)
BUN: 18 mg/dL (ref 8–23)
CO2: 26 mmol/L (ref 22–32)
Calcium: 9.6 mg/dL (ref 8.9–10.3)
Chloride: 103 mmol/L (ref 98–111)
Creatinine: 1.33 mg/dL — ABNORMAL HIGH (ref 0.61–1.24)
GFR, Estimated: 56 mL/min — ABNORMAL LOW (ref >60)
Glucose, Bld: 85 mg/dL (ref 70–99)
Potassium: 4.4 mmol/L (ref 3.5–5.1)
Sodium: 139 mmol/L (ref 135–145)
Total Bilirubin: 0.9 mg/dL (ref 0.3–1.2)
Total Protein: 6.3 g/dL — ABNORMAL LOW (ref 6.5–8.1)

## 2020-11-07 LAB — CBC WITH DIFFERENTIAL/PLATELET
Abs Immature Granulocytes: 0.01 10*3/uL (ref 0.00–0.07)
Basophils Absolute: 0 10*3/uL (ref 0.0–0.1)
Basophils Relative: 1 %
Eosinophils Absolute: 0.1 10*3/uL (ref 0.0–0.5)
Eosinophils Relative: 1 %
HCT: 35.8 % — ABNORMAL LOW (ref 39.0–52.0)
Hemoglobin: 12 g/dL — ABNORMAL LOW (ref 13.0–17.0)
Immature Granulocytes: 0 %
Lymphocytes Relative: 38 %
Lymphs Abs: 1.9 10*3/uL (ref 0.7–4.0)
MCH: 36.1 pg — ABNORMAL HIGH (ref 26.0–34.0)
MCHC: 33.5 g/dL (ref 30.0–36.0)
MCV: 107.8 fL — ABNORMAL HIGH (ref 80.0–100.0)
Monocytes Absolute: 0.8 10*3/uL (ref 0.1–1.0)
Monocytes Relative: 16 %
Neutro Abs: 2.2 10*3/uL (ref 1.7–7.7)
Neutrophils Relative %: 44 %
Platelets: 117 10*3/uL — ABNORMAL LOW (ref 150–400)
RBC: 3.32 MIL/uL — ABNORMAL LOW (ref 4.22–5.81)
RDW: 13.7 % (ref 11.5–15.5)
WBC: 5.1 10*3/uL (ref 4.0–10.5)
nRBC: 0 % (ref 0.0–0.2)

## 2020-11-07 MED ORDER — SODIUM CHLORIDE 0.9 % IV SOLN
20.0000 mg | Freq: Once | INTRAVENOUS | Status: AC
  Administered 2020-11-07: 20 mg via INTRAVENOUS
  Filled 2020-11-07: qty 20

## 2020-11-07 MED ORDER — DEXTROSE 5 % IV SOLN
56.0000 mg/m2 | Freq: Once | INTRAVENOUS | Status: AC
  Administered 2020-11-07: 120 mg via INTRAVENOUS
  Filled 2020-11-07: qty 60

## 2020-11-07 MED ORDER — SODIUM CHLORIDE 0.9% FLUSH
10.0000 mL | INTRAVENOUS | Status: DC | PRN
  Administered 2020-11-07: 10 mL
  Filled 2020-11-07: qty 10

## 2020-11-07 MED ORDER — PROCHLORPERAZINE MALEATE 10 MG PO TABS
ORAL_TABLET | ORAL | Status: AC
  Filled 2020-11-07: qty 1

## 2020-11-07 MED ORDER — SODIUM CHLORIDE 0.9 % IV SOLN
Freq: Once | INTRAVENOUS | Status: DC
  Filled 2020-11-07: qty 250

## 2020-11-07 MED ORDER — SODIUM CHLORIDE 0.9 % IV SOLN
Freq: Once | INTRAVENOUS | Status: AC
Start: 2020-11-07 — End: 2020-11-07
  Filled 2020-11-07: qty 250

## 2020-11-07 MED ORDER — HEPARIN SOD (PORK) LOCK FLUSH 100 UNIT/ML IV SOLN
500.0000 [IU] | Freq: Once | INTRAVENOUS | Status: AC | PRN
  Administered 2020-11-07: 500 [IU]
  Filled 2020-11-07: qty 5

## 2020-11-07 MED ORDER — PROCHLORPERAZINE MALEATE 10 MG PO TABS
10.0000 mg | ORAL_TABLET | Freq: Once | ORAL | Status: AC
  Administered 2020-11-07: 10 mg via ORAL

## 2020-11-07 NOTE — Patient Instructions (Signed)
[facility] Discharge Instructions for Patients Receiving Chemotherapy  Today you received the following chemotherapy agents:  Kyprolis  To help prevent nausea and vomiting after your treatment, we encourage you to take your nausea medication as directed.   If you develop nausea and vomiting that is not controlled by your nausea medication, call the clinic.   BELOW ARE SYMPTOMS THAT SHOULD BE REPORTED IMMEDIATELY:  *FEVER GREATER THAN 100.5 F  *CHILLS WITH OR WITHOUT FEVER  NAUSEA AND VOMITING THAT IS NOT CONTROLLED WITH YOUR NAUSEA MEDICATION  *UNUSUAL SHORTNESS OF BREATH  *UNUSUAL BRUISING OR BLEEDING  TENDERNESS IN MOUTH AND THROAT WITH OR WITHOUT PRESENCE OF ULCERS  *URINARY PROBLEMS  *BOWEL PROBLEMS  UNUSUAL RASH Items with * indicate a potential emergency and should be followed up as soon as possible.  Feel free to call the clinic should you have any questions or concerns. The clinic phone number is [phone].  Please show the CHEMO ALERT CARD at check-in to the Emergency Department and triage nurse.   

## 2020-11-14 ENCOUNTER — Inpatient Hospital Stay: Payer: Medicare Other

## 2020-11-14 ENCOUNTER — Other Ambulatory Visit: Payer: Medicare Other

## 2020-11-14 ENCOUNTER — Other Ambulatory Visit: Payer: Self-pay

## 2020-11-14 VITALS — BP 106/76 | HR 80 | Temp 98.6°F | Resp 18 | Wt 193.8 lb

## 2020-11-14 DIAGNOSIS — C9001 Multiple myeloma in remission: Secondary | ICD-10-CM

## 2020-11-14 DIAGNOSIS — Z79899 Other long term (current) drug therapy: Secondary | ICD-10-CM | POA: Diagnosis not present

## 2020-11-14 DIAGNOSIS — Z95828 Presence of other vascular implants and grafts: Secondary | ICD-10-CM

## 2020-11-14 DIAGNOSIS — C9000 Multiple myeloma not having achieved remission: Secondary | ICD-10-CM | POA: Diagnosis not present

## 2020-11-14 DIAGNOSIS — Z7189 Other specified counseling: Secondary | ICD-10-CM

## 2020-11-14 DIAGNOSIS — Z5112 Encounter for antineoplastic immunotherapy: Secondary | ICD-10-CM | POA: Diagnosis not present

## 2020-11-14 DIAGNOSIS — Z5111 Encounter for antineoplastic chemotherapy: Secondary | ICD-10-CM

## 2020-11-14 LAB — CBC WITH DIFFERENTIAL/PLATELET
Abs Immature Granulocytes: 0.02 10*3/uL (ref 0.00–0.07)
Basophils Absolute: 0 10*3/uL (ref 0.0–0.1)
Basophils Relative: 1 %
Eosinophils Absolute: 0.1 10*3/uL (ref 0.0–0.5)
Eosinophils Relative: 1 %
HCT: 34.5 % — ABNORMAL LOW (ref 39.0–52.0)
Hemoglobin: 11.5 g/dL — ABNORMAL LOW (ref 13.0–17.0)
Immature Granulocytes: 0 %
Lymphocytes Relative: 33 %
Lymphs Abs: 1.6 10*3/uL (ref 0.7–4.0)
MCH: 36.5 pg — ABNORMAL HIGH (ref 26.0–34.0)
MCHC: 33.3 g/dL (ref 30.0–36.0)
MCV: 109.5 fL — ABNORMAL HIGH (ref 80.0–100.0)
Monocytes Absolute: 0.7 10*3/uL (ref 0.1–1.0)
Monocytes Relative: 14 %
Neutro Abs: 2.5 10*3/uL (ref 1.7–7.7)
Neutrophils Relative %: 51 %
Platelets: 136 10*3/uL — ABNORMAL LOW (ref 150–400)
RBC: 3.15 MIL/uL — ABNORMAL LOW (ref 4.22–5.81)
RDW: 13.5 % (ref 11.5–15.5)
WBC: 4.9 10*3/uL (ref 4.0–10.5)
nRBC: 0 % (ref 0.0–0.2)

## 2020-11-14 LAB — CMP (CANCER CENTER ONLY)
ALT: 18 U/L (ref 0–44)
AST: 26 U/L (ref 15–41)
Albumin: 4 g/dL (ref 3.5–5.0)
Alkaline Phosphatase: 66 U/L (ref 38–126)
Anion gap: 7 (ref 5–15)
BUN: 17 mg/dL (ref 8–23)
CO2: 28 mmol/L (ref 22–32)
Calcium: 9.3 mg/dL (ref 8.9–10.3)
Chloride: 104 mmol/L (ref 98–111)
Creatinine: 1.32 mg/dL — ABNORMAL HIGH (ref 0.61–1.24)
GFR, Estimated: 57 mL/min — ABNORMAL LOW (ref >60)
Glucose, Bld: 95 mg/dL (ref 70–99)
Potassium: 4.4 mmol/L (ref 3.5–5.1)
Sodium: 139 mmol/L (ref 135–145)
Total Bilirubin: 0.7 mg/dL (ref 0.3–1.2)
Total Protein: 6 g/dL — ABNORMAL LOW (ref 6.5–8.1)

## 2020-11-14 MED ORDER — PROCHLORPERAZINE MALEATE 10 MG PO TABS
ORAL_TABLET | ORAL | Status: AC
  Filled 2020-11-14: qty 1

## 2020-11-14 MED ORDER — SODIUM CHLORIDE 0.9 % IV SOLN
60.0000 mg | Freq: Once | INTRAVENOUS | Status: AC
  Administered 2020-11-14: 60 mg via INTRAVENOUS
  Filled 2020-11-14: qty 10

## 2020-11-14 MED ORDER — HEPARIN SOD (PORK) LOCK FLUSH 100 UNIT/ML IV SOLN
500.0000 [IU] | Freq: Once | INTRAVENOUS | Status: DC | PRN
  Filled 2020-11-14: qty 5

## 2020-11-14 MED ORDER — ALTEPLASE 2 MG IJ SOLR
2.0000 mg | Freq: Once | INTRAMUSCULAR | Status: DC | PRN
  Filled 2020-11-14: qty 2

## 2020-11-14 MED ORDER — HEPARIN SOD (PORK) LOCK FLUSH 100 UNIT/ML IV SOLN
250.0000 [IU] | Freq: Once | INTRAVENOUS | Status: DC | PRN
  Filled 2020-11-14: qty 5

## 2020-11-14 MED ORDER — DEXTROSE 5 % IV SOLN
56.0000 mg/m2 | Freq: Once | INTRAVENOUS | Status: AC
  Administered 2020-11-14: 120 mg via INTRAVENOUS
  Filled 2020-11-14: qty 60

## 2020-11-14 MED ORDER — HEPARIN SOD (PORK) LOCK FLUSH 100 UNIT/ML IV SOLN
500.0000 [IU] | Freq: Once | INTRAVENOUS | Status: AC | PRN
  Administered 2020-11-14: 500 [IU]
  Filled 2020-11-14: qty 5

## 2020-11-14 MED ORDER — SODIUM CHLORIDE 0.9 % IV SOLN
Freq: Once | INTRAVENOUS | Status: DC
  Filled 2020-11-14: qty 250

## 2020-11-14 MED ORDER — SODIUM CHLORIDE 0.9 % IV SOLN: 60.0000 mg | Freq: Once | INTRAVENOUS | Status: DC

## 2020-11-14 MED ORDER — SODIUM CHLORIDE 0.9% FLUSH
10.0000 mL | INTRAVENOUS | Status: DC | PRN
  Administered 2020-11-14: 10 mL
  Filled 2020-11-14: qty 10

## 2020-11-14 MED ORDER — SODIUM CHLORIDE 0.9 % IV SOLN
Freq: Once | INTRAVENOUS | Status: AC
  Filled 2020-11-14: qty 250

## 2020-11-14 MED ORDER — PROCHLORPERAZINE MALEATE 10 MG PO TABS
10.0000 mg | ORAL_TABLET | Freq: Once | ORAL | Status: AC
Start: 2020-11-14 — End: 2020-11-14
  Administered 2020-11-14: 10 mg via ORAL

## 2020-11-14 MED ORDER — SODIUM CHLORIDE 0.9 % IV SOLN
20.0000 mg | Freq: Once | INTRAVENOUS | Status: AC
  Administered 2020-11-14: 20 mg via INTRAVENOUS
  Filled 2020-11-14: qty 20

## 2020-11-14 MED ORDER — SODIUM CHLORIDE 0.9% FLUSH
3.0000 mL | Freq: Once | INTRAVENOUS | Status: DC | PRN
  Filled 2020-11-14: qty 10

## 2020-11-14 MED ORDER — SODIUM CHLORIDE 0.9% FLUSH
10.0000 mL | Freq: Once | INTRAVENOUS | Status: DC
  Filled 2020-11-14: qty 10

## 2020-11-16 ENCOUNTER — Encounter: Payer: Self-pay | Admitting: Hematology

## 2020-11-16 LAB — MULTIPLE MYELOMA PANEL, SERUM
Albumin SerPl Elph-Mcnc: 3.5 g/dL (ref 2.9–4.4)
Albumin/Glob SerPl: 1.9 — ABNORMAL HIGH (ref 0.7–1.7)
Alpha 1: 0.2 g/dL (ref 0.0–0.4)
Alpha2 Glob SerPl Elph-Mcnc: 0.6 g/dL (ref 0.4–1.0)
B-Globulin SerPl Elph-Mcnc: 0.8 g/dL (ref 0.7–1.3)
Gamma Glob SerPl Elph-Mcnc: 0.2 g/dL — ABNORMAL LOW (ref 0.4–1.8)
Globulin, Total: 1.9 g/dL — ABNORMAL LOW (ref 2.2–3.9)
IgA: 12 mg/dL — ABNORMAL LOW (ref 61–437)
IgG (Immunoglobin G), Serum: 250 mg/dL — ABNORMAL LOW (ref 603–1613)
IgM (Immunoglobulin M), Srm: 16 mg/dL (ref 15–143)
Total Protein ELP: 5.4 g/dL — ABNORMAL LOW (ref 6.0–8.5)

## 2020-11-25 ENCOUNTER — Other Ambulatory Visit: Payer: Self-pay | Admitting: Hematology

## 2020-11-28 ENCOUNTER — Inpatient Hospital Stay: Payer: Medicare Other

## 2020-11-28 ENCOUNTER — Other Ambulatory Visit: Payer: Self-pay

## 2020-11-28 ENCOUNTER — Inpatient Hospital Stay: Payer: Medicare Other | Attending: Hematology

## 2020-11-28 VITALS — BP 112/57 | HR 77 | Temp 98.3°F | Resp 18 | Wt 198.0 lb

## 2020-11-28 DIAGNOSIS — Z7189 Other specified counseling: Secondary | ICD-10-CM

## 2020-11-28 DIAGNOSIS — C9001 Multiple myeloma in remission: Secondary | ICD-10-CM | POA: Diagnosis not present

## 2020-11-28 DIAGNOSIS — Z5112 Encounter for antineoplastic immunotherapy: Secondary | ICD-10-CM | POA: Insufficient documentation

## 2020-11-28 DIAGNOSIS — C9000 Multiple myeloma not having achieved remission: Secondary | ICD-10-CM

## 2020-11-28 DIAGNOSIS — Z95828 Presence of other vascular implants and grafts: Secondary | ICD-10-CM

## 2020-11-28 DIAGNOSIS — Z79899 Other long term (current) drug therapy: Secondary | ICD-10-CM | POA: Insufficient documentation

## 2020-11-28 LAB — CBC WITH DIFFERENTIAL/PLATELET
Abs Immature Granulocytes: 0.01 10*3/uL (ref 0.00–0.07)
Basophils Absolute: 0 10*3/uL (ref 0.0–0.1)
Basophils Relative: 1 %
Eosinophils Absolute: 0.1 10*3/uL (ref 0.0–0.5)
Eosinophils Relative: 2 %
HCT: 34.4 % — ABNORMAL LOW (ref 39.0–52.0)
Hemoglobin: 11.4 g/dL — ABNORMAL LOW (ref 13.0–17.0)
Immature Granulocytes: 0 %
Lymphocytes Relative: 35 %
Lymphs Abs: 1.5 10*3/uL (ref 0.7–4.0)
MCH: 36.7 pg — ABNORMAL HIGH (ref 26.0–34.0)
MCHC: 33.1 g/dL (ref 30.0–36.0)
MCV: 110.6 fL — ABNORMAL HIGH (ref 80.0–100.0)
Monocytes Absolute: 0.5 10*3/uL (ref 0.1–1.0)
Monocytes Relative: 11 %
Neutro Abs: 2.3 10*3/uL (ref 1.7–7.7)
Neutrophils Relative %: 51 %
Platelets: 182 10*3/uL (ref 150–400)
RBC: 3.11 MIL/uL — ABNORMAL LOW (ref 4.22–5.81)
RDW: 12.8 % (ref 11.5–15.5)
WBC: 4.4 10*3/uL (ref 4.0–10.5)
nRBC: 0 % (ref 0.0–0.2)

## 2020-11-28 LAB — CMP (CANCER CENTER ONLY)
ALT: 15 U/L (ref 0–44)
AST: 27 U/L (ref 15–41)
Albumin: 3.8 g/dL (ref 3.5–5.0)
Alkaline Phosphatase: 68 U/L (ref 38–126)
Anion gap: 7 (ref 5–15)
BUN: 14 mg/dL (ref 8–23)
CO2: 26 mmol/L (ref 22–32)
Calcium: 8.9 mg/dL (ref 8.9–10.3)
Chloride: 107 mmol/L (ref 98–111)
Creatinine: 1.25 mg/dL — ABNORMAL HIGH (ref 0.61–1.24)
GFR, Estimated: >60 mL/min
Glucose, Bld: 117 mg/dL — ABNORMAL HIGH (ref 70–99)
Potassium: 4.3 mmol/L (ref 3.5–5.1)
Sodium: 140 mmol/L (ref 135–145)
Total Bilirubin: 0.6 mg/dL (ref 0.3–1.2)
Total Protein: 5.8 g/dL — ABNORMAL LOW (ref 6.5–8.1)

## 2020-11-28 MED ORDER — PROCHLORPERAZINE MALEATE 10 MG PO TABS
ORAL_TABLET | ORAL | Status: AC
  Filled 2020-11-28: qty 1

## 2020-11-28 MED ORDER — SODIUM CHLORIDE 0.9% FLUSH
10.0000 mL | INTRAVENOUS | Status: DC | PRN
  Administered 2020-11-28: 10 mL
  Filled 2020-11-28: qty 10

## 2020-11-28 MED ORDER — SODIUM CHLORIDE 0.9 % IV SOLN
20.0000 mg | Freq: Once | INTRAVENOUS | Status: AC
  Administered 2020-11-28: 20 mg via INTRAVENOUS
  Filled 2020-11-28: qty 20

## 2020-11-28 MED ORDER — SODIUM CHLORIDE 0.9 % IV SOLN
Freq: Once | INTRAVENOUS | Status: AC
Start: 2020-11-28 — End: 2020-11-28
  Filled 2020-11-28: qty 250

## 2020-11-28 MED ORDER — SODIUM CHLORIDE 0.9% FLUSH
10.0000 mL | Freq: Once | INTRAVENOUS | Status: AC
  Administered 2020-11-28: 10 mL
  Filled 2020-11-28: qty 10

## 2020-11-28 MED ORDER — PROCHLORPERAZINE MALEATE 10 MG PO TABS
10.0000 mg | ORAL_TABLET | Freq: Once | ORAL | Status: AC
Start: 2020-11-28 — End: 2020-11-28
  Administered 2020-11-28: 10 mg via ORAL

## 2020-11-28 MED ORDER — DEXTROSE 5 % IV SOLN
56.0000 mg/m2 | Freq: Once | INTRAVENOUS | Status: AC
  Administered 2020-11-28: 120 mg via INTRAVENOUS
  Filled 2020-11-28: qty 60

## 2020-11-28 MED ORDER — HEPARIN SOD (PORK) LOCK FLUSH 100 UNIT/ML IV SOLN
500.0000 [IU] | Freq: Once | INTRAVENOUS | Status: AC | PRN
  Administered 2020-11-28: 500 [IU]
  Filled 2020-11-28: qty 5

## 2020-11-28 MED ORDER — SODIUM CHLORIDE 0.9 % IV SOLN
Freq: Once | INTRAVENOUS | Status: AC
  Filled 2020-11-28: qty 250

## 2020-11-28 NOTE — Patient Instructions (Signed)
[facility] Discharge Instructions for Patients Receiving Chemotherapy  Today you received the following chemotherapy agents:  Kyprolis  To help prevent nausea and vomiting after your treatment, we encourage you to take your nausea medication as directed.   If you develop nausea and vomiting that is not controlled by your nausea medication, call the clinic.   BELOW ARE SYMPTOMS THAT SHOULD BE REPORTED IMMEDIATELY:  *FEVER GREATER THAN 100.5 F  *CHILLS WITH OR WITHOUT FEVER  NAUSEA AND VOMITING THAT IS NOT CONTROLLED WITH YOUR NAUSEA MEDICATION  *UNUSUAL SHORTNESS OF BREATH  *UNUSUAL BRUISING OR BLEEDING  TENDERNESS IN MOUTH AND THROAT WITH OR WITHOUT PRESENCE OF ULCERS  *URINARY PROBLEMS  *BOWEL PROBLEMS  UNUSUAL RASH Items with * indicate a potential emergency and should be followed up as soon as possible.  Feel free to call the clinic should you have any questions or concerns. The clinic phone number is [phone].  Please show the CHEMO ALERT CARD at check-in to the Emergency Department and triage nurse.   

## 2020-11-28 NOTE — Patient Instructions (Signed)
Implanted Port Insertion, Care After This sheet gives you information about how to care for yourself after your procedure. Your health care provider may also give you more specific instructions. If you have problems or questions, contact your health care provider. What can I expect after the procedure? After the procedure, it is common to have:  Discomfort at the port insertion site.  Bruising on the skin over the port. This should improve over 3-4 days. Follow these instructions at home: Port care  After your port is placed, you will get a manufacturer's information card. The card has information about your port. Keep this card with you at all times.  Take care of the port as told by your health care provider. Ask your health care provider if you or a family member can get training for taking care of the port at home. A home health care nurse may also take care of the port.  Make sure to remember what type of port you have. Incision care  Follow instructions from your health care provider about how to take care of your port insertion site. Make sure you: ? Wash your hands with soap and water before and after you change your bandage (dressing). If soap and water are not available, use hand sanitizer. ? Change your dressing as told by your health care provider. ? Leave stitches (sutures), skin glue, or adhesive strips in place. These skin closures may need to stay in place for 2 weeks or longer. If adhesive strip edges start to loosen and curl up, you may trim the loose edges. Do not remove adhesive strips completely unless your health care provider tells you to do that.  Check your port insertion site every day for signs of infection. Check for: ? Redness, swelling, or pain. ? Fluid or blood. ? Warmth. ? Pus or a bad smell.      Activity  Return to your normal activities as told by your health care provider. Ask your health care provider what activities are safe for you.  Do not  lift anything that is heavier than 10 lb (4.5 kg), or the limit that you are told, until your health care provider says that it is safe. General instructions  Take over-the-counter and prescription medicines only as told by your health care provider.  Do not take baths, swim, or use a hot tub until your health care provider approves. Ask your health care provider if you may take showers. You may only be allowed to take sponge baths.  Do not drive for 24 hours if you were given a sedative during your procedure.  Wear a medical alert bracelet in case of an emergency. This will tell any health care providers that you have a port.  Keep all follow-up visits as told by your health care provider. This is important. Contact a health care provider if:  You cannot flush your port with saline as directed, or you cannot draw blood from the port.  You have a fever or chills.  You have redness, swelling, or pain around your port insertion site.  You have fluid or blood coming from your port insertion site.  Your port insertion site feels warm to the touch.  You have pus or a bad smell coming from the port insertion site. Get help right away if:  You have chest pain or shortness of breath.  You have bleeding from your port that you cannot control. Summary  Take care of the port as told by your   health care provider. Keep the manufacturer's information card with you at all times.  Change your dressing as told by your health care provider.  Contact a health care provider if you have a fever or chills or if you have redness, swelling, or pain around your port insertion site.  Keep all follow-up visits as told by your health care provider. This information is not intended to replace advice given to you by your health care provider. Make sure you discuss any questions you have with your health care provider. Document Revised: 02/03/2018 Document Reviewed: 02/03/2018 Elsevier Patient Education   2021 [employer] Inc.  

## 2020-12-05 ENCOUNTER — Other Ambulatory Visit: Payer: Self-pay | Admitting: Hematology

## 2020-12-05 DIAGNOSIS — I82419 Acute embolism and thrombosis of unspecified femoral vein: Secondary | ICD-10-CM

## 2020-12-05 DIAGNOSIS — C9000 Multiple myeloma not having achieved remission: Secondary | ICD-10-CM
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
## 2020-12-12 ENCOUNTER — Inpatient Hospital Stay (HOSPITAL_BASED_OUTPATIENT_CLINIC_OR_DEPARTMENT_OTHER): Payer: Medicare Other | Admitting: Hematology

## 2020-12-12 ENCOUNTER — Other Ambulatory Visit: Payer: Self-pay

## 2020-12-12 ENCOUNTER — Inpatient Hospital Stay: Payer: Medicare Other

## 2020-12-12 ENCOUNTER — Ambulatory Visit: Payer: Medicare Other

## 2020-12-12 ENCOUNTER — Ambulatory Visit (HOSPITAL_BASED_OUTPATIENT_CLINIC_OR_DEPARTMENT_OTHER): Payer: Medicare Other

## 2020-12-12 ENCOUNTER — Other Ambulatory Visit: Payer: Self-pay | Admitting: *Deleted

## 2020-12-12 VITALS — BP 120/72 | HR 67 | Temp 97.6°F | Resp 17 | Ht 71.0 in | Wt 193.6 lb

## 2020-12-12 DIAGNOSIS — C9000 Multiple myeloma not having achieved remission: Secondary | ICD-10-CM

## 2020-12-12 DIAGNOSIS — C9001 Multiple myeloma in remission: Secondary | ICD-10-CM

## 2020-12-12 DIAGNOSIS — Z95828 Presence of other vascular implants and grafts: Secondary | ICD-10-CM

## 2020-12-12 DIAGNOSIS — Z23 Encounter for immunization: Secondary | ICD-10-CM

## 2020-12-12 DIAGNOSIS — Z5112 Encounter for antineoplastic immunotherapy: Secondary | ICD-10-CM | POA: Diagnosis not present

## 2020-12-12 DIAGNOSIS — Z79899 Other long term (current) drug therapy: Secondary | ICD-10-CM | POA: Diagnosis not present

## 2020-12-12 DIAGNOSIS — Z7189 Other specified counseling: Secondary | ICD-10-CM

## 2020-12-12 DIAGNOSIS — Z5111 Encounter for antineoplastic chemotherapy: Secondary | ICD-10-CM

## 2020-12-12 LAB — CBC WITH DIFFERENTIAL (CANCER CENTER ONLY)
Abs Immature Granulocytes: 0.01 10*3/uL (ref 0.00–0.07)
Basophils Absolute: 0 10*3/uL (ref 0.0–0.1)
Basophils Relative: 1 %
Eosinophils Absolute: 0.1 10*3/uL (ref 0.0–0.5)
Eosinophils Relative: 2 %
HCT: 34.7 % — ABNORMAL LOW (ref 39.0–52.0)
Hemoglobin: 11.8 g/dL — ABNORMAL LOW (ref 13.0–17.0)
Immature Granulocytes: 0 %
Lymphocytes Relative: 37 %
Lymphs Abs: 1.5 10*3/uL (ref 0.7–4.0)
MCH: 36.8 pg — ABNORMAL HIGH (ref 26.0–34.0)
MCHC: 34 g/dL (ref 30.0–36.0)
MCV: 108.1 fL — ABNORMAL HIGH (ref 80.0–100.0)
Monocytes Absolute: 0.5 10*3/uL (ref 0.1–1.0)
Monocytes Relative: 11 %
Neutro Abs: 2 10*3/uL (ref 1.7–7.7)
Neutrophils Relative %: 49 %
Platelet Count: 169 10*3/uL (ref 150–400)
RBC: 3.21 MIL/uL — ABNORMAL LOW (ref 4.22–5.81)
RDW: 12.5 % (ref 11.5–15.5)
WBC Count: 4.1 10*3/uL (ref 4.0–10.5)
nRBC: 0 % (ref 0.0–0.2)

## 2020-12-12 LAB — CMP (CANCER CENTER ONLY)
ALT: 16 U/L (ref 0–44)
AST: 26 U/L (ref 15–41)
Albumin: 3.8 g/dL (ref 3.5–5.0)
Alkaline Phosphatase: 70 U/L (ref 38–126)
Anion gap: 9 (ref 5–15)
BUN: 17 mg/dL (ref 8–23)
CO2: 25 mmol/L (ref 22–32)
Calcium: 9.5 mg/dL (ref 8.9–10.3)
Chloride: 105 mmol/L (ref 98–111)
Creatinine: 1.41 mg/dL — ABNORMAL HIGH (ref 0.61–1.24)
GFR, Estimated: 52 mL/min — ABNORMAL LOW (ref >60)
Glucose, Bld: 128 mg/dL — ABNORMAL HIGH (ref 70–99)
Potassium: 4.2 mmol/L (ref 3.5–5.1)
Sodium: 139 mmol/L (ref 135–145)
Total Bilirubin: 0.6 mg/dL (ref 0.3–1.2)
Total Protein: 5.9 g/dL — ABNORMAL LOW (ref 6.5–8.1)

## 2020-12-12 MED ORDER — SODIUM CHLORIDE 0.9 % IV SOLN
20.0000 mg | Freq: Once | INTRAVENOUS | Status: AC
  Administered 2020-12-12: 20 mg via INTRAVENOUS
  Filled 2020-12-12: qty 20

## 2020-12-12 MED ORDER — SODIUM CHLORIDE 0.9 % IV SOLN
Freq: Once | INTRAVENOUS | Status: AC
  Filled 2020-12-12: qty 250

## 2020-12-12 MED ORDER — PROCHLORPERAZINE MALEATE 10 MG PO TABS
10.0000 mg | ORAL_TABLET | Freq: Once | ORAL | Status: AC
Start: 2020-12-12 — End: 2020-12-12
  Administered 2020-12-12: 10 mg via ORAL

## 2020-12-12 MED ORDER — SODIUM CHLORIDE 0.9% FLUSH
10.0000 mL | Freq: Once | INTRAVENOUS | Status: AC
Start: 2020-12-12 — End: 2020-12-12
  Administered 2020-12-12: 10 mL
  Filled 2020-12-12: qty 10

## 2020-12-12 MED ORDER — DEXTROSE 5 % IV SOLN
56.0000 mg/m2 | Freq: Once | INTRAVENOUS | Status: AC
  Administered 2020-12-12: 120 mg via INTRAVENOUS
  Filled 2020-12-12: qty 60

## 2020-12-12 MED ORDER — PROCHLORPERAZINE MALEATE 10 MG PO TABS
ORAL_TABLET | ORAL | Status: AC
  Filled 2020-12-12: qty 1

## 2020-12-12 MED ORDER — SODIUM CHLORIDE 0.9% FLUSH
10.0000 mL | INTRAVENOUS | Status: DC | PRN
  Administered 2020-12-12: 10 mL
  Filled 2020-12-12: qty 10

## 2020-12-12 MED ORDER — HEPARIN SOD (PORK) LOCK FLUSH 100 UNIT/ML IV SOLN
500.0000 [IU] | Freq: Once | INTRAVENOUS | Status: AC | PRN
  Administered 2020-12-12: 500 [IU]
  Filled 2020-12-12: qty 5

## 2020-12-12 NOTE — Patient Instructions (Addendum)
[facility] MEDICAL ONCOLOGY  Discharge Instructions: Thank you for choosing [facility] to provide your oncology and hematology care.   If you have a lab appointment with the Cancer Center, please go directly to the Cancer Center and check in at the registration area.   Wear comfortable clothing and clothing appropriate for easy access to any Portacath or PICC line.   We strive to give you quality time with your provider. You may need to reschedule your appointment if you arrive late (15 or more minutes).  Arriving late affects you and other patients whose appointments are after yours.  Also, if you miss three or more appointments without notifying the office, you may be dismissed from the clinic at the provider's discretion.      For prescription refill requests, have your pharmacy contact our office and allow 72 hours for refills to be completed.    Today you received the following chemotherapy and/or immunotherapy agent: Carfilzomib (Kyprolis).    To help prevent nausea and vomiting after your treatment, we encourage you to take your nausea medication as directed.  BELOW ARE SYMPTOMS THAT SHOULD BE REPORTED IMMEDIATELY: *FEVER GREATER THAN 100.4 F (38 C) OR HIGHER *CHILLS OR SWEATING *NAUSEA AND VOMITING THAT IS NOT CONTROLLED WITH YOUR NAUSEA MEDICATION *UNUSUAL SHORTNESS OF BREATH *UNUSUAL BRUISING OR BLEEDING *URINARY PROBLEMS (pain or burning when urinating, or frequent urination) *BOWEL PROBLEMS (unusual diarrhea, constipation, pain near the anus) TENDERNESS IN MOUTH AND THROAT WITH OR WITHOUT PRESENCE OF ULCERS (sore throat, sores in mouth, or a toothache) UNUSUAL RASH, SWELLING OR PAIN  UNUSUAL VAGINAL DISCHARGE OR ITCHING   Items with * indicate a potential emergency and should be followed up as soon as possible or go to the Emergency Department if any problems should occur.  Please show the CHEMOTHERAPY ALERT CARD or IMMUNOTHERAPY ALERT CARD at  check-in to the Emergency Department and triage nurse.  Should you have questions after your visit or need to cancel or reschedule your appointment, please contact [facility]COL[facility]  Dept: [phone]  and follow the prompts.  Office hours are 8:00 a.m. to 4:30 p.m. Monday - Friday. Please note that voicemails left after 4:00 p.m. may not be returned until the following business day.  We are closed weekends and major holidays. You have access to a nurse at all times for urgent questions. Please call the main number to the clinic Dept: [phone] and follow the prompts.   For any non-urgent questions, you may also contact your provider using MyChart. We now offer e-Visits for anyone 18 and older to request care online for non-urgent symptoms. For details visit [email or web address].   Also download the MyChart app! Go to the app store, search "MyChart", open the app, select [facility], and log in with your MyChart username and password.  Due to Covid, a mask is required upon entering the hospital/clinic. If you do not have a mask, one will be given to you upon arrival. For doctor visits, patients may have 1 support person aged 18 or older with them. For treatment visits, patients cannot have anyone with them due to current Covid guidelines and our immunocompromised population.   

## 2020-12-12 NOTE — Progress Notes (Signed)
Per Dr. Irene Limbo, patient due for COVID vaccine today. Reviewed immunization history and discussed with Dr. Irene Limbo since patient has previously had 4 COVID vaccines. Dr. Irene Limbo advised to continue with administration as this dose will be the 1st booster since patient's transplant. Discussed with Carolynne Edouard, RPh who also confirmed this as appropriate. Patient aware of conversations and agrees to proceed with vaccine as ordered. Patient's primary RN, Junius Creamer, also aware and will administer before patient leaves treatment room today.

## 2020-12-12 NOTE — Progress Notes (Signed)
   [id number]  Name:  [name].    MRN: [id number] DOB: [birth year]  12/12/2020  [name] was observed post Covid-19 immunization for 15 minutes without incident. He was provided with Vaccine Information Sheet and instruction to access the V-Safe system.   [name] was instructed to call 911 with any severe reactions post vaccine: Marland Kitchen Difficulty breathing  . Swelling of face and throat  . A fast heartbeat  . A bad rash all over body  . Dizziness and weakness

## 2020-12-13 ENCOUNTER — Other Ambulatory Visit: Payer: Self-pay | Admitting: Hematology

## 2020-12-13 ENCOUNTER — Telehealth: Payer: Self-pay | Admitting: Hematology

## 2020-12-13 NOTE — Telephone Encounter (Signed)
Scheduled follow-up appointments per 5/24 los. Patient is aware.

## 2020-12-15 LAB — MULTIPLE MYELOMA PANEL, SERUM
Albumin SerPl Elph-Mcnc: 3.4 g/dL (ref 2.9–4.4)
Albumin/Glob SerPl: 1.8 — ABNORMAL HIGH (ref 0.7–1.7)
Alpha 1: 0.2 g/dL (ref 0.0–0.4)
Alpha2 Glob SerPl Elph-Mcnc: 0.6 g/dL (ref 0.4–1.0)
B-Globulin SerPl Elph-Mcnc: 0.8 g/dL (ref 0.7–1.3)
Gamma Glob SerPl Elph-Mcnc: 0.2 g/dL — ABNORMAL LOW (ref 0.4–1.8)
Globulin, Total: 1.9 g/dL — ABNORMAL LOW (ref 2.2–3.9)
IgA: 13 mg/dL — ABNORMAL LOW (ref 61–437)
IgG (Immunoglobin G), Serum: 213 mg/dL — ABNORMAL LOW (ref 603–1613)
IgM (Immunoglobulin M), Srm: 15 mg/dL (ref 15–143)
M Protein SerPl Elph-Mcnc: 0.1 g/dL — ABNORMAL HIGH
Total Protein ELP: 5.3 g/dL — ABNORMAL LOW (ref 6.0–8.5)

## 2020-12-16 ENCOUNTER — Other Ambulatory Visit: Payer: Self-pay | Admitting: Hematology

## 2020-12-16 DIAGNOSIS — C9000 Multiple myeloma not having achieved remission: Secondary | ICD-10-CM

## 2020-12-18 ENCOUNTER — Encounter: Payer: Self-pay | Admitting: Hematology

## 2020-12-19 ENCOUNTER — Encounter: Payer: Self-pay | Admitting: Hematology

## 2020-12-19 DIAGNOSIS — Z9484 Stem cells transplant status: Secondary | ICD-10-CM | POA: Diagnosis not present

## 2020-12-19 DIAGNOSIS — C9000 Multiple myeloma not having achieved remission: Secondary | ICD-10-CM | POA: Diagnosis not present

## 2020-12-19 DIAGNOSIS — Z23 Encounter for immunization: Secondary | ICD-10-CM | POA: Diagnosis not present

## 2020-12-23 ENCOUNTER — Other Ambulatory Visit: Payer: Self-pay | Admitting: Hematology

## 2020-12-23 DIAGNOSIS — C9000 Multiple myeloma not having achieved remission: Secondary | ICD-10-CM

## 2020-12-25 ENCOUNTER — Other Ambulatory Visit: Payer: Self-pay

## 2020-12-25 ENCOUNTER — Encounter: Payer: Self-pay | Admitting: Hematology

## 2020-12-25 DIAGNOSIS — C9000 Multiple myeloma not having achieved remission: Secondary | ICD-10-CM

## 2020-12-25 DIAGNOSIS — M545 Low back pain, unspecified: Secondary | ICD-10-CM | POA: Diagnosis not present

## 2020-12-25 DIAGNOSIS — M5416 Radiculopathy, lumbar region: Secondary | ICD-10-CM | POA: Diagnosis not present

## 2020-12-25 DIAGNOSIS — M4120 Other idiopathic scoliosis, site unspecified: Secondary | ICD-10-CM | POA: Diagnosis not present

## 2020-12-26 ENCOUNTER — Other Ambulatory Visit: Payer: Medicare Other

## 2020-12-26 ENCOUNTER — Other Ambulatory Visit: Payer: Self-pay | Admitting: Hematology and Oncology

## 2020-12-26 ENCOUNTER — Inpatient Hospital Stay: Payer: Medicare Other

## 2020-12-26 ENCOUNTER — Other Ambulatory Visit: Payer: Self-pay

## 2020-12-26 ENCOUNTER — Ambulatory Visit: Payer: Medicare Other

## 2020-12-26 ENCOUNTER — Inpatient Hospital Stay: Payer: Medicare Other | Attending: Hematology

## 2020-12-26 VITALS — BP 109/78 | HR 75 | Temp 98.7°F | Resp 17 | Wt 198.0 lb

## 2020-12-26 DIAGNOSIS — Z7189 Other specified counseling: Secondary | ICD-10-CM

## 2020-12-26 DIAGNOSIS — C9000 Multiple myeloma not having achieved remission: Secondary | ICD-10-CM

## 2020-12-26 DIAGNOSIS — Z5112 Encounter for antineoplastic immunotherapy: Secondary | ICD-10-CM | POA: Insufficient documentation

## 2020-12-26 DIAGNOSIS — Z79899 Other long term (current) drug therapy: Secondary | ICD-10-CM | POA: Insufficient documentation

## 2020-12-26 DIAGNOSIS — C9001 Multiple myeloma in remission: Secondary | ICD-10-CM | POA: Insufficient documentation

## 2020-12-26 DIAGNOSIS — Z95828 Presence of other vascular implants and grafts: Secondary | ICD-10-CM

## 2020-12-26 LAB — CBC WITH DIFFERENTIAL (CANCER CENTER ONLY)
Abs Immature Granulocytes: 0.01 10*3/uL (ref 0.00–0.07)
Basophils Absolute: 0 10*3/uL (ref 0.0–0.1)
Basophils Relative: 1 %
Eosinophils Absolute: 0.2 10*3/uL (ref 0.0–0.5)
Eosinophils Relative: 4 %
HCT: 35 % — ABNORMAL LOW (ref 39.0–52.0)
Hemoglobin: 12 g/dL — ABNORMAL LOW (ref 13.0–17.0)
Immature Granulocytes: 0 %
Lymphocytes Relative: 33 %
Lymphs Abs: 1.6 10*3/uL (ref 0.7–4.0)
MCH: 36.6 pg — ABNORMAL HIGH (ref 26.0–34.0)
MCHC: 34.3 g/dL (ref 30.0–36.0)
MCV: 106.7 fL — ABNORMAL HIGH (ref 80.0–100.0)
Monocytes Absolute: 0.4 10*3/uL (ref 0.1–1.0)
Monocytes Relative: 9 %
Neutro Abs: 2.6 10*3/uL (ref 1.7–7.7)
Neutrophils Relative %: 53 %
Platelet Count: 195 10*3/uL (ref 150–400)
RBC: 3.28 MIL/uL — ABNORMAL LOW (ref 4.22–5.81)
RDW: 12.5 % (ref 11.5–15.5)
WBC Count: 4.8 10*3/uL (ref 4.0–10.5)
nRBC: 0 % (ref 0.0–0.2)

## 2020-12-26 LAB — CMP (CANCER CENTER ONLY)
ALT: 18 U/L (ref 0–44)
AST: 32 U/L (ref 15–41)
Albumin: 3.8 g/dL (ref 3.5–5.0)
Alkaline Phosphatase: 63 U/L (ref 38–126)
Anion gap: 10 (ref 5–15)
BUN: 16 mg/dL (ref 8–23)
CO2: 24 mmol/L (ref 22–32)
Calcium: 9.5 mg/dL (ref 8.9–10.3)
Chloride: 106 mmol/L (ref 98–111)
Creatinine: 1.32 mg/dL — ABNORMAL HIGH (ref 0.61–1.24)
GFR, Estimated: 57 mL/min — ABNORMAL LOW (ref >60)
Glucose, Bld: 82 mg/dL (ref 70–99)
Potassium: 4.4 mmol/L (ref 3.5–5.1)
Sodium: 140 mmol/L (ref 135–145)
Total Bilirubin: 0.6 mg/dL (ref 0.3–1.2)
Total Protein: 6.1 g/dL — ABNORMAL LOW (ref 6.5–8.1)

## 2020-12-26 MED ORDER — SODIUM CHLORIDE 0.9 % IV SOLN
20.0000 mg | Freq: Once | INTRAVENOUS | Status: AC
  Administered 2020-12-26: 20 mg via INTRAVENOUS
  Filled 2020-12-26: qty 20

## 2020-12-26 MED ORDER — SODIUM CHLORIDE 0.9% FLUSH
10.0000 mL | INTRAVENOUS | Status: AC | PRN
  Administered 2020-12-26: 10 mL
  Filled 2020-12-26: qty 10

## 2020-12-26 MED ORDER — DEXTROSE 5 % IV SOLN
56.0000 mg/m2 | Freq: Once | INTRAVENOUS | Status: AC
  Administered 2020-12-26: 120 mg via INTRAVENOUS
  Filled 2020-12-26: qty 60

## 2020-12-26 MED ORDER — PROCHLORPERAZINE MALEATE 10 MG PO TABS
ORAL_TABLET | ORAL | Status: AC
  Filled 2020-12-26: qty 1

## 2020-12-26 MED ORDER — HEPARIN SOD (PORK) LOCK FLUSH 100 UNIT/ML IV SOLN
500.0000 [IU] | Freq: Once | INTRAVENOUS | Status: AC | PRN
  Administered 2020-12-26: 500 [IU]
  Filled 2020-12-26: qty 5

## 2020-12-26 MED ORDER — PROCHLORPERAZINE MALEATE 10 MG PO TABS
10.0000 mg | ORAL_TABLET | Freq: Once | ORAL | Status: AC
  Administered 2020-12-26: 10 mg via ORAL

## 2020-12-26 MED ORDER — SODIUM CHLORIDE 0.9 % IV SOLN
Freq: Once | INTRAVENOUS | Status: AC
  Filled 2020-12-26: qty 250

## 2020-12-26 MED ORDER — SODIUM CHLORIDE 0.9% FLUSH
10.0000 mL | INTRAVENOUS | Status: DC | PRN
  Administered 2020-12-26: 10 mL
  Filled 2020-12-26: qty 10

## 2020-12-26 NOTE — Patient Instructions (Signed)
[facility]ICAL [facility]COLOGY  Discharge Instructions: Thank you for choosing [facility] to provide your oncology and hematology care.   If you have a lab appointment with the Cancer Center, please go directly to the Cancer Center and check in at the registration area.   Wear comfortable clothing and clothing appropriate for easy access to any Portacath or PICC line.   We strive to give you quality time with your provider. You may need to reschedule your appointment if you arrive late (15 or more minutes).  Arriving late affects you and other patients whose appointments are after yours.  Also, if you miss three or more appointments without notifying the office, you may be dismissed from the clinic at the provider's discretion.      For prescription refill requests, have your pharmacy contact our office and allow 72 hours for refills to be completed.    Today you received the following chemotherapy and/or immunotherapy agent: Carfilzomib (Kyprolis).    To help prevent nausea and vomiting after your treatment, we encourage you to take your nausea medication as directed.  BELOW ARE SYMPTOMS THAT SHOULD BE REPORTED IMMEDIATELY: *FEVER GREATER THAN 100.4 F (38 C) OR HIGHER *CHILLS OR SWEATING *NAUSEA AND VOMITING THAT IS NOT CONTROLLED WITH YOUR NAUSEA MEDICATION *UNUSUAL SHORTNESS OF BREATH *UNUSUAL BRUISING OR BLEEDING *URINARY PROBLEMS (pain or burning when urinating, or frequent urination) *BOWEL PROBLEMS (unusual diarrhea, constipation, pain near the anus) TENDERNESS IN MOUTH AND THROAT WITH OR WITHOUT PRESENCE OF ULCERS (sore throat, sores in mouth, or a toothache) UNUSUAL RASH, SWELLING OR PAIN  UNUSUAL VAGINAL DISCHARGE OR ITCHING   Items with * indicate a potential emergency and should be followed up as soon as possible or go to the Emergency Department if any problems should occur.  Please show the CHEMOTHERAPY ALERT CARD or IMMUNOTHERAPY ALERT CARD at  check-in to the Emergency Department and triage nurse.  Should you have questions after your visit or need to cancel or reschedule your appointment, please contact [facility]ICAL ONCOL[facility]  Dept: [phone]  and follow the prompts.  Office hours are 8:00 a.m. to 4:30 p.m. Monday - Friday. Please note that voicemails left after 4:00 p.m. may not be returned until the following business day.  We are closed weekends and major holidays. You have access to a nurse at all times for urgent questions. Please call the main number to the clinic Dept: [phone] and follow the prompts.   For any non-urgent questions, you may also contact your provider using MyChart. We now offer e-Visits for anyone 18 and older to request care online for non-urgent symptoms. For details visit [email or web address].   Also download the MyChart app! Go to the app store, search "MyChart", open the app, select [place], and log in with your MyChart username and password.  Due to Covid, a mask is required upon entering the hospital/clinic. If you do not have a mask, one will be given to you upon arrival. For doctor visits, patients may have 1 support person aged 18 or older with them. For treatment visits, patients cannot have anyone with them due to current Covid guidelines and our immunocompromised population.   

## 2021-01-08 ENCOUNTER — Other Ambulatory Visit: Payer: Self-pay

## 2021-01-08 DIAGNOSIS — C9000 Multiple myeloma not having achieved remission: Secondary | ICD-10-CM

## 2021-01-09 ENCOUNTER — Inpatient Hospital Stay: Payer: Medicare Other

## 2021-01-09 ENCOUNTER — Other Ambulatory Visit: Payer: Self-pay | Admitting: Physician Assistant

## 2021-01-09 ENCOUNTER — Other Ambulatory Visit: Payer: Self-pay

## 2021-01-09 ENCOUNTER — Other Ambulatory Visit: Payer: Medicare Other

## 2021-01-09 VITALS — BP 125/79 | HR 75 | Temp 98.5°F | Resp 18 | Wt 197.3 lb

## 2021-01-09 DIAGNOSIS — C9001 Multiple myeloma in remission: Secondary | ICD-10-CM | POA: Diagnosis not present

## 2021-01-09 DIAGNOSIS — Z5112 Encounter for antineoplastic immunotherapy: Secondary | ICD-10-CM | POA: Diagnosis not present

## 2021-01-09 DIAGNOSIS — C9000 Multiple myeloma not having achieved remission: Secondary | ICD-10-CM

## 2021-01-09 DIAGNOSIS — Z79899 Other long term (current) drug therapy: Secondary | ICD-10-CM | POA: Diagnosis not present

## 2021-01-09 DIAGNOSIS — Z7189 Other specified counseling: Secondary | ICD-10-CM

## 2021-01-09 DIAGNOSIS — Z95828 Presence of other vascular implants and grafts: Secondary | ICD-10-CM

## 2021-01-09 LAB — CBC WITH DIFFERENTIAL (CANCER CENTER ONLY)
Abs Immature Granulocytes: 0.01 10*3/uL (ref 0.00–0.07)
Basophils Absolute: 0 10*3/uL (ref 0.0–0.1)
Basophils Relative: 1 %
Eosinophils Absolute: 0.2 10*3/uL (ref 0.0–0.5)
Eosinophils Relative: 4 %
HCT: 36 % — ABNORMAL LOW (ref 39.0–52.0)
Hemoglobin: 12.2 g/dL — ABNORMAL LOW (ref 13.0–17.0)
Immature Granulocytes: 0 %
Lymphocytes Relative: 33 %
Lymphs Abs: 1.5 10*3/uL (ref 0.7–4.0)
MCH: 36.1 pg — ABNORMAL HIGH (ref 26.0–34.0)
MCHC: 33.9 g/dL (ref 30.0–36.0)
MCV: 106.5 fL — ABNORMAL HIGH (ref 80.0–100.0)
Monocytes Absolute: 0.4 10*3/uL (ref 0.1–1.0)
Monocytes Relative: 9 %
Neutro Abs: 2.4 10*3/uL (ref 1.7–7.7)
Neutrophils Relative %: 53 %
Platelet Count: 172 10*3/uL (ref 150–400)
RBC: 3.38 MIL/uL — ABNORMAL LOW (ref 4.22–5.81)
RDW: 12.7 % (ref 11.5–15.5)
WBC Count: 4.5 10*3/uL (ref 4.0–10.5)
nRBC: 0 % (ref 0.0–0.2)

## 2021-01-09 LAB — CMP (CANCER CENTER ONLY)
ALT: 21 U/L (ref 0–44)
AST: 29 U/L (ref 15–41)
Albumin: 4.2 g/dL (ref 3.5–5.0)
Alkaline Phosphatase: 60 U/L (ref 38–126)
Anion gap: 6 (ref 5–15)
BUN: 17 mg/dL (ref 8–23)
CO2: 27 mmol/L (ref 22–32)
Calcium: 9.4 mg/dL (ref 8.9–10.3)
Chloride: 104 mmol/L (ref 98–111)
Creatinine: 1.41 mg/dL — ABNORMAL HIGH (ref 0.61–1.24)
GFR, Estimated: 52 mL/min — ABNORMAL LOW (ref >60)
Glucose, Bld: 86 mg/dL (ref 70–99)
Potassium: 4.6 mmol/L (ref 3.5–5.1)
Sodium: 137 mmol/L (ref 135–145)
Total Bilirubin: 0.6 mg/dL (ref 0.3–1.2)
Total Protein: 6.2 g/dL — ABNORMAL LOW (ref 6.5–8.1)

## 2021-01-09 MED ORDER — SODIUM CHLORIDE 0.9 % IV SOLN
20.0000 mg | Freq: Once | INTRAVENOUS | Status: AC
  Administered 2021-01-09: 20 mg via INTRAVENOUS
  Filled 2021-01-09: qty 20

## 2021-01-09 MED ORDER — SODIUM CHLORIDE 0.9% FLUSH
10.0000 mL | INTRAVENOUS | Status: DC | PRN
  Administered 2021-01-09: 10 mL via INTRAVENOUS
  Filled 2021-01-09: qty 10

## 2021-01-09 MED ORDER — PROCHLORPERAZINE MALEATE 10 MG PO TABS
10.0000 mg | ORAL_TABLET | Freq: Once | ORAL | Status: AC
  Administered 2021-01-09: 10 mg via ORAL

## 2021-01-09 MED ORDER — PROCHLORPERAZINE MALEATE 10 MG PO TABS
ORAL_TABLET | ORAL | Status: AC
  Filled 2021-01-09: qty 1

## 2021-01-09 MED ORDER — HEPARIN SOD (PORK) LOCK FLUSH 100 UNIT/ML IV SOLN
500.0000 [IU] | Freq: Once | INTRAVENOUS | Status: AC | PRN
  Administered 2021-01-09: 500 [IU]
  Filled 2021-01-09: qty 5

## 2021-01-09 MED ORDER — DEXTROSE 5 % IV SOLN
56.0000 mg/m2 | Freq: Once | INTRAVENOUS | Status: AC
  Administered 2021-01-09: 120 mg via INTRAVENOUS
  Filled 2021-01-09: qty 60

## 2021-01-09 MED ORDER — SODIUM CHLORIDE 0.9 % IV SOLN
Freq: Once | INTRAVENOUS | Status: AC
  Filled 2021-01-09: qty 250

## 2021-01-09 MED ORDER — SODIUM CHLORIDE 0.9% FLUSH
10.0000 mL | INTRAVENOUS | Status: DC | PRN
  Administered 2021-01-09: 10 mL
  Filled 2021-01-09: qty 10

## 2021-01-09 MED ORDER — SODIUM CHLORIDE 0.9 % IV SOLN
Freq: Once | INTRAVENOUS | Status: AC
Start: 2021-01-09 — End: 2021-01-09
  Filled 2021-01-09: qty 250

## 2021-01-09 NOTE — Patient Instructions (Signed)
[facility]  Discharge Instructions: Thank you for choosing [facility] to provide your oncology and hematology care.   If you have a lab appointment with the Cancer Center, please go directly to the Cancer Center and check in at the registration area.   Wear comfortable clothing and clothing appropriate for easy access to any Portacath or PICC line.   We strive to give you quality time with your provider. You may need to reschedule your appointment if you arrive late (15 or more minutes).  Arriving late affects you and other patients whose appointments are after yours.  Also, if you miss three or more appointments without notifying the office, you may be dismissed from the clinic at the provider's discretion.      For prescription refill requests, have your pharmacy contact our office and allow 72 hours for refills to be completed.    Today you received the following chemotherapy and/or immunotherapy agents kyprolis      To help prevent nausea and vomiting after your treatment, we encourage you to take your nausea medication as directed.  BELOW ARE SYMPTOMS THAT SHOULD BE REPORTED IMMEDIATELY: . *FEVER GREATER THAN 100.4 F (38 C) OR HIGHER . *CHILLS OR SWEATING . *NAUSEA AND VOMITING THAT IS NOT CONTROLLED WITH YOUR NAUSEA MEDICATION . *UNUSUAL SHORTNESS OF BREATH . *UNUSUAL BRUISING OR BLEEDING . *URINARY PROBLEMS (pain or burning when urinating, or frequent urination) . *BOWEL PROBLEMS (unusual diarrhea, constipation, pain near the anus) . TENDERNESS IN MOUTH AND THROAT WITH OR WITHOUT PRESENCE OF ULCERS (sore throat, sores in mouth, or a toothache) . UNUSUAL RASH, SWELLING OR PAIN  . UNUSUAL VAGINAL DISCHARGE OR ITCHING   Items with * indicate a potential emergency and should be followed up as soon as possible or go to the Emergency Department if any problems should occur.  Please show the CHEMOTHERAPY ALERT CARD or IMMUNOTHERAPY ALERT  CARD at check-in to the Emergency Department and triage nurse.  Should you have questions after your visit or need to cancel or reschedule your appointment, please contact [facility]  Dept: [phone]  and follow the prompts.  Office hours are 8:00 a.m. to 4:30 p.m. Monday - Friday. Please note that voicemails left after 4:00 p.m. may not be returned until the following business day.  We are closed weekends and major holidays. You have access to a nurse at all times for urgent questions. Please call the main number to the clinic Dept: [phone] and follow the prompts.   For any non-urgent questions, you may also contact your provider using MyChart. We now offer e-Visits for anyone 18 and older to request care online for non-urgent symptoms. For details visit [email or web address].   Also download the MyChart app! Go to the app store, search "MyChart", open the app, select [facility], and log in with your MyChart username and password.  Due to Covid, a mask is required upon entering the hospital/clinic. If you do not have a mask, one will be given to you upon arrival. For doctor visits, patients may have 1 support person aged 18 or older with them. For treatment visits, patients cannot have anyone with them due to current Covid guidelines and our immunocompromised population.   

## 2021-01-09 NOTE — Progress Notes (Signed)
MD Lorenso Courier ok to treat without today's (6/21) cmp.   Larene Beach, PharmD

## 2021-01-09 NOTE — Progress Notes (Signed)
Ok to proceed with previous labs per Dr.Dorsey

## 2021-01-15 ENCOUNTER — Encounter: Payer: Self-pay | Admitting: Hematology

## 2021-01-16 ENCOUNTER — Encounter: Payer: Self-pay | Admitting: Hematology

## 2021-01-17 ENCOUNTER — Other Ambulatory Visit: Payer: Self-pay

## 2021-01-17 DIAGNOSIS — C9000 Multiple myeloma not having achieved remission: Secondary | ICD-10-CM

## 2021-01-17 MED ORDER — TRAMADOL HCL 50 MG PO TABS: ORAL_TABLET | ORAL | 0 refills | Status: DC

## 2021-01-18 ENCOUNTER — Encounter: Payer: Self-pay | Admitting: Hematology

## 2021-01-19 ENCOUNTER — Other Ambulatory Visit: Payer: Self-pay

## 2021-01-19 DIAGNOSIS — C9000 Multiple myeloma not having achieved remission: Secondary | ICD-10-CM

## 2021-01-19 MED ORDER — GABAPENTIN 100 MG PO CAPS: ORAL_CAPSULE | ORAL | 2 refills | Status: DC

## 2021-01-23 ENCOUNTER — Other Ambulatory Visit: Payer: Self-pay

## 2021-01-23 ENCOUNTER — Inpatient Hospital Stay: Payer: Medicare Other | Attending: Hematology

## 2021-01-23 ENCOUNTER — Inpatient Hospital Stay: Payer: Medicare Other

## 2021-01-23 ENCOUNTER — Other Ambulatory Visit: Payer: Self-pay | Admitting: Hematology

## 2021-01-23 ENCOUNTER — Other Ambulatory Visit: Payer: Self-pay | Admitting: *Deleted

## 2021-01-23 ENCOUNTER — Other Ambulatory Visit: Payer: Medicare Other

## 2021-01-23 VITALS — BP 117/79 | HR 79 | Temp 98.0°F | Resp 17 | Wt 194.2 lb

## 2021-01-23 DIAGNOSIS — C9000 Multiple myeloma not having achieved remission: Secondary | ICD-10-CM | POA: Insufficient documentation

## 2021-01-23 DIAGNOSIS — Z5112 Encounter for antineoplastic immunotherapy: Secondary | ICD-10-CM | POA: Diagnosis not present

## 2021-01-23 DIAGNOSIS — Z79899 Other long term (current) drug therapy: Secondary | ICD-10-CM | POA: Diagnosis not present

## 2021-01-23 DIAGNOSIS — Z7189 Other specified counseling: Secondary | ICD-10-CM

## 2021-01-23 LAB — CMP (CANCER CENTER ONLY)
ALT: 19 U/L (ref 0–44)
AST: 25 U/L (ref 15–41)
Albumin: 3.7 g/dL (ref 3.5–5.0)
Alkaline Phosphatase: 70 U/L (ref 38–126)
Anion gap: 5 (ref 5–15)
BUN: 17 mg/dL (ref 8–23)
CO2: 26 mmol/L (ref 22–32)
Calcium: 9.2 mg/dL (ref 8.9–10.3)
Chloride: 107 mmol/L (ref 98–111)
Creatinine: 1.24 mg/dL (ref 0.61–1.24)
GFR, Estimated: >60 mL/min (ref >60)
Glucose, Bld: 79 mg/dL (ref 70–99)
Potassium: 4.3 mmol/L (ref 3.5–5.1)
Sodium: 138 mmol/L (ref 135–145)
Total Bilirubin: 0.6 mg/dL (ref 0.3–1.2)
Total Protein: 5.9 g/dL — ABNORMAL LOW (ref 6.5–8.1)

## 2021-01-23 LAB — CBC WITH DIFFERENTIAL (CANCER CENTER ONLY)
Abs Immature Granulocytes: 0.01 10*3/uL (ref 0.00–0.07)
Basophils Absolute: 0 10*3/uL (ref 0.0–0.1)
Basophils Relative: 1 %
Eosinophils Absolute: 0.1 10*3/uL (ref 0.0–0.5)
Eosinophils Relative: 2 %
HCT: 35.2 % — ABNORMAL LOW (ref 39.0–52.0)
Hemoglobin: 12 g/dL — ABNORMAL LOW (ref 13.0–17.0)
Immature Granulocytes: 0 %
Lymphocytes Relative: 27 %
Lymphs Abs: 1.3 10*3/uL (ref 0.7–4.0)
MCH: 36.4 pg — ABNORMAL HIGH (ref 26.0–34.0)
MCHC: 34.1 g/dL (ref 30.0–36.0)
MCV: 106.7 fL — ABNORMAL HIGH (ref 80.0–100.0)
Monocytes Absolute: 0.5 10*3/uL (ref 0.1–1.0)
Monocytes Relative: 10 %
Neutro Abs: 2.9 10*3/uL (ref 1.7–7.7)
Neutrophils Relative %: 60 %
Platelet Count: 182 10*3/uL (ref 150–400)
RBC: 3.3 MIL/uL — ABNORMAL LOW (ref 4.22–5.81)
RDW: 12.9 % (ref 11.5–15.5)
WBC Count: 4.9 10*3/uL (ref 4.0–10.5)
nRBC: 0 % (ref 0.0–0.2)

## 2021-01-23 MED ORDER — HEPARIN SOD (PORK) LOCK FLUSH 100 UNIT/ML IV SOLN
500.0000 [IU] | Freq: Once | INTRAVENOUS | Status: AC | PRN
  Administered 2021-01-23: 500 [IU]
  Filled 2021-01-23: qty 5

## 2021-01-23 MED ORDER — PROCHLORPERAZINE MALEATE 10 MG PO TABS
ORAL_TABLET | ORAL | Status: AC
  Filled 2021-01-23: qty 1

## 2021-01-23 MED ORDER — DEXTROSE 5 % IV SOLN
56.0000 mg/m2 | Freq: Once | INTRAVENOUS | Status: AC
  Administered 2021-01-23: 120 mg via INTRAVENOUS
  Filled 2021-01-23: qty 60

## 2021-01-23 MED ORDER — SODIUM CHLORIDE 0.9 % IV SOLN
Freq: Once | INTRAVENOUS | Status: AC
  Filled 2021-01-23: qty 250

## 2021-01-23 MED ORDER — PROCHLORPERAZINE MALEATE 10 MG PO TABS
10.0000 mg | ORAL_TABLET | Freq: Once | ORAL | Status: AC
  Administered 2021-01-23: 10 mg via ORAL

## 2021-01-23 MED ORDER — SODIUM CHLORIDE 0.9% FLUSH
10.0000 mL | INTRAVENOUS | Status: DC | PRN
  Administered 2021-01-23: 10 mL
  Filled 2021-01-23: qty 10

## 2021-01-23 MED ORDER — SODIUM CHLORIDE 0.9 % IV SOLN
Freq: Once | INTRAVENOUS | Status: AC
Start: 2021-01-23 — End: 2021-01-23
  Filled 2021-01-23: qty 250

## 2021-01-23 MED ORDER — SODIUM CHLORIDE 0.9 % IV SOLN
20.0000 mg | Freq: Once | INTRAVENOUS | Status: AC
  Administered 2021-01-23: 20 mg via INTRAVENOUS
  Filled 2021-01-23: qty 20

## 2021-01-23 NOTE — Patient Instructions (Signed)
[facility]  Discharge Instructions: Thank you for choosing [facility] to provide your oncology and hematology care.   If you have a lab appointment with the [facility], please go directly to the [facility] and check in at the registration area.   Wear comfortable clothing and clothing appropriate for easy access to any Portacath or PICC line.   We strive to give you quality time with your provider. You may need to reschedule your appointment if you arrive late (15 or more minutes).  Arriving late affects you and other patients whose appointments are after yours.  Also, if you miss three or more appointments without notifying the office, you may be dismissed from the clinic at the provider's discretion.      For prescription refill requests, have your pharmacy contact our office and allow 72 hours for refills to be completed.    Today you received the following chemotherapy and/or immunotherapy agents Cafilzomib.      To help prevent nausea and vomiting after your treatment, we encourage you to take your nausea medication as directed.  BELOW ARE SYMPTOMS THAT SHOULD BE REPORTED IMMEDIATELY: *FEVER GREATER THAN 100.4 F (38 C) OR HIGHER *CHILLS OR SWEATING *NAUSEA AND VOMITING THAT IS NOT CONTROLLED WITH YOUR NAUSEA MEDICATION *UNUSUAL SHORTNESS OF BREATH *UNUSUAL BRUISING OR BLEEDING *URINARY PROBLEMS (pain or burning when urinating, or frequent urination) *BOWEL PROBLEMS (unusual diarrhea, constipation, pain near the anus) TENDERNESS IN MOUTH AND THROAT WITH OR WITHOUT PRESENCE OF ULCERS (sore throat, sores in mouth, or a toothache) UNUSUAL RASH, SWELLING OR PAIN  UNUSUAL VAGINAL DISCHARGE OR ITCHING   Items with * indicate a potential emergency and should be followed up as soon as possible or go to the Emergency Department if any problems should occur.  Please show the CHEMOTHERAPY ALERT CARD or IMMUNOTHERAPY ALERT CARD at check-in to  the Emergency Department and triage nurse.  Should you have questions after your visit or need to cancel or reschedule your appointment, please contact [facility]  Dept: [phone]  and follow the prompts.  Office hours are 8:00 a.m. to 4:30 p.m. Monday - Friday. Please note that voicemails left after 4:00 p.m. may not be returned until the following business day.  We are closed weekends and major holidays. You have access to a nurse at all times for urgent questions. Please call the main number to the clinic Dept: [phone] and follow the prompts.   For any non-urgent questions, you may also contact your provider using MyChart. We now offer e-Visits for anyone 17 and older to request care online for non-urgent symptoms. For details visit [email or web address].   Also download the MyChart app! Go to the app store, search "MyChart", open the app, select [facility], and log in with your MyChart username and password.  Due to Covid, a mask is required upon entering the hospital/clinic. If you do not have a mask, one will be given to you upon arrival. For doctor visits, patients may have 1 support person aged 1 or older with them. For treatment visits, patients cannot have anyone with them due to current Covid guidelines and our immunocompromised population.

## 2021-01-29 LAB — MULTIPLE MYELOMA PANEL, SERUM
Albumin SerPl Elph-Mcnc: 3.7 g/dL (ref 2.9–4.4)
Albumin/Glob SerPl: 2 — ABNORMAL HIGH (ref 0.7–1.7)
Alpha 1: 0.2 g/dL (ref 0.0–0.4)
Alpha2 Glob SerPl Elph-Mcnc: 0.7 g/dL (ref 0.4–1.0)
B-Globulin SerPl Elph-Mcnc: 0.9 g/dL (ref 0.7–1.3)
Gamma Glob SerPl Elph-Mcnc: 0.1 g/dL — ABNORMAL LOW (ref 0.4–1.8)
Globulin, Total: 1.9 g/dL — ABNORMAL LOW (ref 2.2–3.9)
IgA: 10 mg/dL — ABNORMAL LOW (ref 61–437)
IgG (Immunoglobin G), Serum: 201 mg/dL — ABNORMAL LOW (ref 603–1613)
IgM (Immunoglobulin M), Srm: 13 mg/dL — ABNORMAL LOW (ref 15–143)
Total Protein ELP: 5.6 g/dL — ABNORMAL LOW (ref 6.0–8.5)

## 2021-02-05 ENCOUNTER — Other Ambulatory Visit: Payer: Self-pay

## 2021-02-05 DIAGNOSIS — C9000 Multiple myeloma not having achieved remission: Secondary | ICD-10-CM
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
## 2021-02-06 ENCOUNTER — Inpatient Hospital Stay (HOSPITAL_BASED_OUTPATIENT_CLINIC_OR_DEPARTMENT_OTHER): Payer: Medicare Other | Admitting: Hematology

## 2021-02-06 ENCOUNTER — Inpatient Hospital Stay: Payer: Medicare Other

## 2021-02-06 ENCOUNTER — Other Ambulatory Visit: Payer: Self-pay

## 2021-02-06 VITALS — BP 137/71 | HR 88 | Temp 98.3°F | Resp 18 | Ht 71.0 in | Wt 194.6 lb

## 2021-02-06 DIAGNOSIS — C9000 Multiple myeloma not having achieved remission: Secondary | ICD-10-CM

## 2021-02-06 DIAGNOSIS — Z79899 Other long term (current) drug therapy: Secondary | ICD-10-CM | POA: Diagnosis not present

## 2021-02-06 DIAGNOSIS — C9001 Multiple myeloma in remission: Secondary | ICD-10-CM

## 2021-02-06 DIAGNOSIS — Z5112 Encounter for antineoplastic immunotherapy: Secondary | ICD-10-CM | POA: Diagnosis not present

## 2021-02-06 DIAGNOSIS — Z7189 Other specified counseling: Secondary | ICD-10-CM

## 2021-02-06 DIAGNOSIS — Z5111 Encounter for antineoplastic chemotherapy: Secondary | ICD-10-CM

## 2021-02-06 LAB — CMP (CANCER CENTER ONLY)
ALT: 17 U/L (ref 0–44)
AST: 24 U/L (ref 15–41)
Albumin: 4 g/dL (ref 3.5–5.0)
Alkaline Phosphatase: 79 U/L (ref 38–126)
Anion gap: 6 (ref 5–15)
BUN: 16 mg/dL (ref 8–23)
CO2: 28 mmol/L (ref 22–32)
Calcium: 9.9 mg/dL (ref 8.9–10.3)
Chloride: 106 mmol/L (ref 98–111)
Creatinine: 1.23 mg/dL (ref 0.61–1.24)
GFR, Estimated: >60 mL/min
Glucose, Bld: 92 mg/dL (ref 70–99)
Potassium: 4.6 mmol/L (ref 3.5–5.1)
Sodium: 140 mmol/L (ref 135–145)
Total Bilirubin: 0.6 mg/dL (ref 0.3–1.2)
Total Protein: 6.3 g/dL — ABNORMAL LOW (ref 6.5–8.1)

## 2021-02-06 LAB — CBC WITH DIFFERENTIAL (CANCER CENTER ONLY)
Abs Immature Granulocytes: 0.01 10*3/uL (ref 0.00–0.07)
Basophils Absolute: 0 10*3/uL (ref 0.0–0.1)
Basophils Relative: 1 %
Eosinophils Absolute: 0.1 10*3/uL (ref 0.0–0.5)
Eosinophils Relative: 2 %
HCT: 36.7 % — ABNORMAL LOW (ref 39.0–52.0)
Hemoglobin: 12.5 g/dL — ABNORMAL LOW (ref 13.0–17.0)
Immature Granulocytes: 0 %
Lymphocytes Relative: 32 %
Lymphs Abs: 1.7 10*3/uL (ref 0.7–4.0)
MCH: 36.1 pg — ABNORMAL HIGH (ref 26.0–34.0)
MCHC: 34.1 g/dL (ref 30.0–36.0)
MCV: 106.1 fL — ABNORMAL HIGH (ref 80.0–100.0)
Monocytes Absolute: 0.5 10*3/uL (ref 0.1–1.0)
Monocytes Relative: 9 %
Neutro Abs: 3 10*3/uL (ref 1.7–7.7)
Neutrophils Relative %: 56 %
Platelet Count: 172 10*3/uL (ref 150–400)
RBC: 3.46 MIL/uL — ABNORMAL LOW (ref 4.22–5.81)
RDW: 13.1 % (ref 11.5–15.5)
WBC Count: 5.3 10*3/uL (ref 4.0–10.5)
nRBC: 0 % (ref 0.0–0.2)

## 2021-02-06 MED ORDER — PROCHLORPERAZINE MALEATE 10 MG PO TABS
ORAL_TABLET | ORAL | Status: AC
  Filled 2021-02-06: qty 1

## 2021-02-06 MED ORDER — SODIUM CHLORIDE 0.9 % IV SOLN
20.0000 mg | Freq: Once | INTRAVENOUS | Status: AC
  Administered 2021-02-06: 20 mg via INTRAVENOUS
  Filled 2021-02-06: qty 20

## 2021-02-06 MED ORDER — PROCHLORPERAZINE MALEATE 10 MG PO TABS
10.0000 mg | ORAL_TABLET | Freq: Once | ORAL | Status: AC
  Administered 2021-02-06: 10 mg via ORAL

## 2021-02-06 MED ORDER — SODIUM CHLORIDE 0.9 % IV SOLN
Freq: Once | INTRAVENOUS | Status: AC
  Filled 2021-02-06: qty 250

## 2021-02-06 MED ORDER — DEXTROSE 5 % IV SOLN
56.0000 mg/m2 | Freq: Once | INTRAVENOUS | Status: AC
  Administered 2021-02-06: 120 mg via INTRAVENOUS
  Filled 2021-02-06: qty 60

## 2021-02-06 MED ORDER — HEPARIN SOD (PORK) LOCK FLUSH 100 UNIT/ML IV SOLN
500.0000 [IU] | Freq: Once | INTRAVENOUS | Status: AC | PRN
  Administered 2021-02-06: 500 [IU]
  Filled 2021-02-06: qty 5

## 2021-02-06 MED ORDER — SODIUM CHLORIDE 0.9% FLUSH
10.0000 mL | INTRAVENOUS | Status: DC | PRN
  Administered 2021-02-06: 10 mL
  Filled 2021-02-06: qty 10

## 2021-02-06 NOTE — Patient Instructions (Signed)
[name]  Discharge Instructions: Thank you for choosing [name] to provide your oncology and hematology care.   If you have a lab appointment with the [name], please go directly to the [name] and check in at the registration area.   Wear comfortable clothing and clothing appropriate for easy access to any Portacath or PICC line.   We strive to give you quality time with your provider. You may need to reschedule your appointment if you arrive late (15 or more minutes).  Arriving late affects you and other patients whose appointments are after yours.  Also, if you miss three or more appointments without notifying the office, you may be dismissed from the clinic at the provider's discretion.      For prescription refill requests, have your pharmacy contact our office and allow 72 hours for refills to be completed.    Today you received the following chemotherapy and/or immunotherapy agents: Kyprolis    To help prevent nausea and vomiting after your treatment, we encourage you to take your nausea medication as directed.  BELOW ARE SYMPTOMS THAT SHOULD BE REPORTED IMMEDIATELY: . *FEVER GREATER THAN 100.4 F (38 C) OR HIGHER . *CHILLS OR SWEATING . *NAUSEA AND VOMITING THAT IS NOT CONTROLLED WITH YOUR NAUSEA MEDICATION . *UNUSUAL SHORTNESS OF BREATH . *UNUSUAL BRUISING OR BLEEDING . *URINARY PROBLEMS (pain or burning when urinating, or frequent urination) . *BOWEL PROBLEMS (unusual diarrhea, constipation, pain near the anus) . TENDERNESS IN MOUTH AND THROAT WITH OR WITHOUT PRESENCE OF ULCERS (sore throat, sores in mouth, or a toothache) . UNUSUAL RASH, SWELLING OR PAIN  . UNUSUAL VAGINAL DISCHARGE OR ITCHING   Items with * indicate a potential emergency and should be followed up as soon as possible or go to the Emergency Department if any problems should occur.  Please show the CHEMOTHERAPY ALERT CARD or IMMUNOTHERAPY ALERT  CARD at check-in to the Emergency Department and triage nurse.  Should you have questions after your visit or need to cancel or reschedule your appointment, please contact [name]  Dept: [phone]  and follow the prompts.  Office hours are 8:00 a.m. to 4:30 p.m. Monday - Friday. Please note that voicemails left after 4:00 p.m. may not be returned until the following business day.  We are closed weekends and major holidays. You have access to a nurse at all times for urgent questions. Please call the main number to the clinic Dept: [phone] and follow the prompts.   For any non-urgent questions, you may also contact your provider using MyChart. We now offer e-Visits for anyone 18 and older to request care online for non-urgent symptoms. For details visit [email or web address].   Also download the MyChart app! Go to the app store, search "MyChart", open the app, select [facility], and log in with your MyChart username and password.  Due to Covid, a mask is required upon entering the hospital/clinic. If you do not have a mask, one will be given to you upon arrival. For doctor visits, patients may have 1 support person aged 18 or older with them. For treatment visits, patients cannot have anyone with them due to current Covid guidelines and our immunocompromised population.   

## 2021-02-06 NOTE — Patient Instructions (Signed)

## 2021-02-06 NOTE — Progress Notes (Signed)
Dr. Irene Limbo has chart locked and is trying to figure out how to unlock it. Unable to release flush from supportive therapy at this time.

## 2021-02-07 ENCOUNTER — Telehealth: Payer: Self-pay | Admitting: Hematology

## 2021-02-07 NOTE — Telephone Encounter (Signed)
Left message with follow-up appointments per 7/19 los. 

## 2021-02-08 ENCOUNTER — Other Ambulatory Visit: Payer: Self-pay | Admitting: Physician Assistant

## 2021-02-08 DIAGNOSIS — C9000 Multiple myeloma not having achieved remission: Secondary | ICD-10-CM

## 2021-02-09 ENCOUNTER — Other Ambulatory Visit: Payer: Self-pay

## 2021-02-09 DIAGNOSIS — C9000 Multiple myeloma not having achieved remission: Secondary | ICD-10-CM

## 2021-02-09 MED ORDER — TRAMADOL HCL 50 MG PO TABS: ORAL_TABLET | ORAL | 0 refills | Status: DC

## 2021-02-12 ENCOUNTER — Encounter: Payer: Self-pay | Admitting: Hematology

## 2021-02-12 LAB — MULTIPLE MYELOMA PANEL, SERUM
Albumin SerPl Elph-Mcnc: 3.8 g/dL (ref 2.9–4.4)
Albumin/Glob SerPl: 1.9 — ABNORMAL HIGH (ref 0.7–1.7)
Alpha 1: 0.2 g/dL (ref 0.0–0.4)
Alpha2 Glob SerPl Elph-Mcnc: 0.7 g/dL (ref 0.4–1.0)
B-Globulin SerPl Elph-Mcnc: 0.9 g/dL (ref 0.7–1.3)
Gamma Glob SerPl Elph-Mcnc: 0.2 g/dL — ABNORMAL LOW (ref 0.4–1.8)
Globulin, Total: 2.1 g/dL — ABNORMAL LOW (ref 2.2–3.9)
IgA: 11 mg/dL — ABNORMAL LOW (ref 61–437)
IgG (Immunoglobin G), Serum: 191 mg/dL — ABNORMAL LOW (ref 603–1613)
IgM (Immunoglobulin M), Srm: 14 mg/dL — ABNORMAL LOW (ref 15–143)
Total Protein ELP: 5.9 g/dL — ABNORMAL LOW (ref 6.0–8.5)

## 2021-02-13 NOTE — Progress Notes (Signed)
Contacted pt per Dr Irene Limbo: Left message to let patient know his Myeloma panel shows no M spike -- he continues to be in remission  Instructed pt to call back with any questions.

## 2021-02-19 ENCOUNTER — Other Ambulatory Visit: Payer: Self-pay

## 2021-02-19 DIAGNOSIS — C9000 Multiple myeloma not having achieved remission: Secondary | ICD-10-CM

## 2021-02-20 ENCOUNTER — Inpatient Hospital Stay: Payer: Medicare Other | Attending: Hematology

## 2021-02-20 ENCOUNTER — Inpatient Hospital Stay: Payer: Medicare Other

## 2021-02-20 ENCOUNTER — Other Ambulatory Visit: Payer: Self-pay

## 2021-02-20 VITALS — BP 128/73 | HR 76 | Temp 98.4°F | Resp 16 | Wt 196.4 lb

## 2021-02-20 DIAGNOSIS — Z79899 Other long term (current) drug therapy: Secondary | ICD-10-CM | POA: Diagnosis not present

## 2021-02-20 DIAGNOSIS — C9000 Multiple myeloma not having achieved remission: Secondary | ICD-10-CM | POA: Diagnosis not present

## 2021-02-20 DIAGNOSIS — Z95828 Presence of other vascular implants and grafts: Secondary | ICD-10-CM

## 2021-02-20 DIAGNOSIS — Z5112 Encounter for antineoplastic immunotherapy: Secondary | ICD-10-CM | POA: Insufficient documentation

## 2021-02-20 DIAGNOSIS — Z7189 Other specified counseling: Secondary | ICD-10-CM

## 2021-02-20 LAB — CMP (CANCER CENTER ONLY)
ALT: 16 U/L (ref 0–44)
AST: 22 U/L (ref 15–41)
Albumin: 3.7 g/dL (ref 3.5–5.0)
Alkaline Phosphatase: 72 U/L (ref 38–126)
Anion gap: 7 (ref 5–15)
BUN: 20 mg/dL (ref 8–23)
CO2: 26 mmol/L (ref 22–32)
Calcium: 10 mg/dL (ref 8.9–10.3)
Chloride: 107 mmol/L (ref 98–111)
Creatinine: 1.26 mg/dL — ABNORMAL HIGH (ref 0.61–1.24)
GFR, Estimated: 60 mL/min — ABNORMAL LOW (ref >60)
Glucose, Bld: 110 mg/dL — ABNORMAL HIGH (ref 70–99)
Potassium: 4.3 mmol/L (ref 3.5–5.1)
Sodium: 140 mmol/L (ref 135–145)
Total Bilirubin: 0.5 mg/dL (ref 0.3–1.2)
Total Protein: 5.7 g/dL — ABNORMAL LOW (ref 6.5–8.1)

## 2021-02-20 LAB — CBC WITH DIFFERENTIAL (CANCER CENTER ONLY)
Abs Immature Granulocytes: 0.01 10*3/uL (ref 0.00–0.07)
Basophils Absolute: 0 10*3/uL (ref 0.0–0.1)
Basophils Relative: 0 %
Eosinophils Absolute: 0.2 10*3/uL (ref 0.0–0.5)
Eosinophils Relative: 3 %
HCT: 35.2 % — ABNORMAL LOW (ref 39.0–52.0)
Hemoglobin: 11.9 g/dL — ABNORMAL LOW (ref 13.0–17.0)
Immature Granulocytes: 0 %
Lymphocytes Relative: 25 %
Lymphs Abs: 1.2 10*3/uL (ref 0.7–4.0)
MCH: 35.4 pg — ABNORMAL HIGH (ref 26.0–34.0)
MCHC: 33.8 g/dL (ref 30.0–36.0)
MCV: 104.8 fL — ABNORMAL HIGH (ref 80.0–100.0)
Monocytes Absolute: 0.4 10*3/uL (ref 0.1–1.0)
Monocytes Relative: 10 %
Neutro Abs: 2.8 10*3/uL (ref 1.7–7.7)
Neutrophils Relative %: 62 %
Platelet Count: 164 10*3/uL (ref 150–400)
RBC: 3.36 MIL/uL — ABNORMAL LOW (ref 4.22–5.81)
RDW: 13 % (ref 11.5–15.5)
WBC Count: 4.6 10*3/uL (ref 4.0–10.5)
nRBC: 0 % (ref 0.0–0.2)

## 2021-02-20 MED ORDER — HEPARIN SOD (PORK) LOCK FLUSH 100 UNIT/ML IV SOLN
500.0000 [IU] | Freq: Once | INTRAVENOUS | Status: AC | PRN
  Administered 2021-02-20: 500 [IU]
  Filled 2021-02-20: qty 5

## 2021-02-20 MED ORDER — PROCHLORPERAZINE MALEATE 10 MG PO TABS
10.0000 mg | ORAL_TABLET | Freq: Once | ORAL | Status: AC
  Administered 2021-02-20: 10 mg via ORAL

## 2021-02-20 MED ORDER — SODIUM CHLORIDE 0.9 % IV SOLN
Freq: Once | INTRAVENOUS | Status: AC
  Filled 2021-02-20: qty 250

## 2021-02-20 MED ORDER — DEXTROSE 5 % IV SOLN
56.0000 mg/m2 | Freq: Once | INTRAVENOUS | Status: AC
  Administered 2021-02-20: 120 mg via INTRAVENOUS
  Filled 2021-02-20: qty 60

## 2021-02-20 MED ORDER — SODIUM CHLORIDE 0.9% FLUSH
10.0000 mL | INTRAVENOUS | Status: DC | PRN
  Administered 2021-02-20: 10 mL via INTRAVENOUS
  Filled 2021-02-20: qty 10

## 2021-02-20 MED ORDER — SODIUM CHLORIDE 0.9 % IV SOLN
20.0000 mg | Freq: Once | INTRAVENOUS | Status: AC
  Administered 2021-02-20: 20 mg via INTRAVENOUS
  Filled 2021-02-20: qty 20

## 2021-02-20 MED ORDER — PROCHLORPERAZINE MALEATE 10 MG PO TABS
ORAL_TABLET | ORAL | Status: AC
  Filled 2021-02-20: qty 1

## 2021-02-20 MED ORDER — SODIUM CHLORIDE 0.9% FLUSH
10.0000 mL | INTRAVENOUS | Status: DC | PRN
  Administered 2021-02-20: 10 mL
  Filled 2021-02-20: qty 10

## 2021-02-20 NOTE — Patient Instructions (Signed)
[facility]  Discharge Instructions: Thank you for choosing [facility] to provide your oncology and hematology care.   If you have a lab appointment with the Cancer Center, please go directly to the Cancer Center and check in at the registration area.   Wear comfortable clothing and clothing appropriate for easy access to any Portacath or PICC line.   We strive to give you quality time with your provider. You may need to reschedule your appointment if you arrive late (15 or more minutes).  Arriving late affects you and other patients whose appointments are after yours.  Also, if you miss three or more appointments without notifying the office, you may be dismissed from the clinic at the provider's discretion.      For prescription refill requests, have your pharmacy contact our office and allow 72 hours for refills to be completed.    Today you received the following chemotherapy and/or immunotherapy agents: Carfilzomib.      To help prevent nausea and vomiting after your treatment, we encourage you to take your nausea medication as directed.  BELOW ARE SYMPTOMS THAT SHOULD BE REPORTED IMMEDIATELY: *FEVER GREATER THAN 100.4 F (38 C) OR HIGHER *CHILLS OR SWEATING *NAUSEA AND VOMITING THAT IS NOT CONTROLLED WITH YOUR NAUSEA MEDICATION *UNUSUAL SHORTNESS OF BREATH *UNUSUAL BRUISING OR BLEEDING *URINARY PROBLEMS (pain or burning when urinating, or frequent urination) *BOWEL PROBLEMS (unusual diarrhea, constipation, pain near the anus) TENDERNESS IN MOUTH AND THROAT WITH OR WITHOUT PRESENCE OF ULCERS (sore throat, sores in mouth, or a toothache) UNUSUAL RASH, SWELLING OR PAIN  UNUSUAL VAGINAL DISCHARGE OR ITCHING   Items with * indicate a potential emergency and should be followed up as soon as possible or go to the Emergency Department if any problems should occur.  Please show the CHEMOTHERAPY ALERT CARD or IMMUNOTHERAPY ALERT CARD at check-in  to the Emergency Department and triage nurse.  Should you have questions after your visit or need to cancel or reschedule your appointment, please contact [facility]  Dept: [phone]  and follow the prompts.  Office hours are 8:00 a.m. to 4:30 p.m. Monday - Friday. Please note that voicemails left after 4:00 p.m. may not be returned until the following business day.  We are closed weekends and major holidays. You have access to a nurse at all times for urgent questions. Please call the main number to the clinic Dept: [phone] and follow the prompts.   For any non-urgent questions, you may also contact your provider using MyChart. We now offer e-Visits for anyone 18 and older to request care online for non-urgent symptoms. For details visit [email or web address].   Also download the MyChart app! Go to the app store, search "MyChart", open the app, select [facility], and log in with your MyChart username and password.  Due to Covid, a mask is required upon entering the hospital/clinic. If you do not have a mask, one will be given to you upon arrival. For doctor visits, patients may have 1 support person aged 18 or older with them. For treatment visits, patients cannot have anyone with them due to current Covid guidelines and our immunocompromised population.   

## 2021-02-23 ENCOUNTER — Other Ambulatory Visit: Payer: Self-pay | Admitting: Internal Medicine

## 2021-03-04 ENCOUNTER — Other Ambulatory Visit: Payer: Self-pay | Admitting: Hematology

## 2021-03-04 DIAGNOSIS — C9000 Multiple myeloma not having achieved remission: Secondary | ICD-10-CM

## 2021-03-05 ENCOUNTER — Other Ambulatory Visit: Payer: Self-pay

## 2021-03-05 DIAGNOSIS — C9000 Multiple myeloma not having achieved remission: Secondary | ICD-10-CM

## 2021-03-06 ENCOUNTER — Encounter: Payer: Self-pay | Admitting: Hematology

## 2021-03-06 ENCOUNTER — Inpatient Hospital Stay: Payer: Medicare Other

## 2021-03-06 ENCOUNTER — Other Ambulatory Visit: Payer: Self-pay

## 2021-03-06 ENCOUNTER — Other Ambulatory Visit: Payer: Self-pay | Admitting: Hematology

## 2021-03-06 VITALS — BP 141/91 | HR 76 | Temp 98.4°F | Resp 18 | Wt 193.5 lb

## 2021-03-06 DIAGNOSIS — C9000 Multiple myeloma not having achieved remission: Secondary | ICD-10-CM | POA: Diagnosis not present

## 2021-03-06 DIAGNOSIS — Z95828 Presence of other vascular implants and grafts: Secondary | ICD-10-CM

## 2021-03-06 DIAGNOSIS — Z79899 Other long term (current) drug therapy: Secondary | ICD-10-CM | POA: Diagnosis not present

## 2021-03-06 DIAGNOSIS — Z7189 Other specified counseling: Secondary | ICD-10-CM

## 2021-03-06 DIAGNOSIS — Z5112 Encounter for antineoplastic immunotherapy: Secondary | ICD-10-CM | POA: Diagnosis not present

## 2021-03-06 LAB — CBC WITH DIFFERENTIAL (CANCER CENTER ONLY)
Abs Immature Granulocytes: 0.01 10*3/uL (ref 0.00–0.07)
Basophils Absolute: 0 10*3/uL (ref 0.0–0.1)
Basophils Relative: 1 %
Eosinophils Absolute: 0.1 10*3/uL (ref 0.0–0.5)
Eosinophils Relative: 3 %
HCT: 34.8 % — ABNORMAL LOW (ref 39.0–52.0)
Hemoglobin: 12.1 g/dL — ABNORMAL LOW (ref 13.0–17.0)
Immature Granulocytes: 0 %
Lymphocytes Relative: 33 %
Lymphs Abs: 1.4 10*3/uL (ref 0.7–4.0)
MCH: 35.9 pg — ABNORMAL HIGH (ref 26.0–34.0)
MCHC: 34.8 g/dL (ref 30.0–36.0)
MCV: 103.3 fL — ABNORMAL HIGH (ref 80.0–100.0)
Monocytes Absolute: 0.4 10*3/uL (ref 0.1–1.0)
Monocytes Relative: 10 %
Neutro Abs: 2.3 10*3/uL (ref 1.7–7.7)
Neutrophils Relative %: 53 %
Platelet Count: 171 10*3/uL (ref 150–400)
RBC: 3.37 MIL/uL — ABNORMAL LOW (ref 4.22–5.81)
RDW: 13.2 % (ref 11.5–15.5)
WBC Count: 4.3 10*3/uL (ref 4.0–10.5)
nRBC: 0 % (ref 0.0–0.2)

## 2021-03-06 LAB — CMP (CANCER CENTER ONLY)
ALT: 17 U/L (ref 0–44)
AST: 24 U/L (ref 15–41)
Albumin: 3.9 g/dL (ref 3.5–5.0)
Alkaline Phosphatase: 70 U/L (ref 38–126)
Anion gap: 8 (ref 5–15)
BUN: 16 mg/dL (ref 8–23)
CO2: 26 mmol/L (ref 22–32)
Calcium: 9.5 mg/dL (ref 8.9–10.3)
Chloride: 106 mmol/L (ref 98–111)
Creatinine: 1.39 mg/dL — ABNORMAL HIGH (ref 0.61–1.24)
GFR, Estimated: 53 mL/min — ABNORMAL LOW (ref >60)
Glucose, Bld: 96 mg/dL (ref 70–99)
Potassium: 4.2 mmol/L (ref 3.5–5.1)
Sodium: 140 mmol/L (ref 135–145)
Total Bilirubin: 0.7 mg/dL (ref 0.3–1.2)
Total Protein: 6 g/dL — ABNORMAL LOW (ref 6.5–8.1)

## 2021-03-06 MED ORDER — SODIUM CHLORIDE 0.9% FLUSH
10.0000 mL | Freq: Once | INTRAVENOUS | Status: AC
  Administered 2021-03-06: 10 mL via INTRAVENOUS

## 2021-03-06 MED ORDER — SODIUM CHLORIDE 0.9 % IV SOLN: Freq: Once | INTRAVENOUS | Status: AC

## 2021-03-06 MED ORDER — PROCHLORPERAZINE MALEATE 10 MG PO TABS
10.0000 mg | ORAL_TABLET | Freq: Once | ORAL | Status: AC
  Administered 2021-03-06: 10 mg via ORAL
  Filled 2021-03-06: qty 1

## 2021-03-06 MED ORDER — SODIUM CHLORIDE 0.9% FLUSH
10.0000 mL | INTRAVENOUS | Status: DC | PRN
  Administered 2021-03-06: 10 mL

## 2021-03-06 MED ORDER — HEPARIN SOD (PORK) LOCK FLUSH 100 UNIT/ML IV SOLN
500.0000 [IU] | Freq: Once | INTRAVENOUS | Status: AC | PRN
  Administered 2021-03-06: 500 [IU]

## 2021-03-06 MED ORDER — SODIUM CHLORIDE 0.9 % IV SOLN
20.0000 mg | Freq: Once | INTRAVENOUS | Status: AC
  Administered 2021-03-06: 20 mg via INTRAVENOUS
  Filled 2021-03-06: qty 20

## 2021-03-06 MED ORDER — DEXTROSE 5 % IV SOLN
56.0000 mg/m2 | Freq: Once | INTRAVENOUS | Status: AC
  Administered 2021-03-06: 120 mg via INTRAVENOUS
  Filled 2021-03-06: qty 60

## 2021-03-06 NOTE — Patient Instructions (Signed)
[facility]  Discharge Instructions: Thank you for choosing [facility] to provide your oncology and hematology care.   If you have a lab appointment with the Cancer Center, please go directly to the Cancer Center and check in at the registration area.   Wear comfortable clothing and clothing appropriate for easy access to any Portacath or PICC line.   We strive to give you quality time with your provider. You may need to reschedule your appointment if you arrive late (15 or more minutes).  Arriving late affects you and other patients whose appointments are after yours.  Also, if you miss three or more appointments without notifying the office, you may be dismissed from the clinic at the provider's discretion.      For prescription refill requests, have your pharmacy contact our office and allow 72 hours for refills to be completed.    Today you received the following chemotherapy and/or immunotherapy agents: Kyprolis    To help prevent nausea and vomiting after your treatment, we encourage you to take your nausea medication as directed.  BELOW ARE SYMPTOMS THAT SHOULD BE REPORTED IMMEDIATELY: . *FEVER GREATER THAN 100.4 F (38 C) OR HIGHER . *CHILLS OR SWEATING . *NAUSEA AND VOMITING THAT IS NOT CONTROLLED WITH YOUR NAUSEA MEDICATION . *UNUSUAL SHORTNESS OF BREATH . *UNUSUAL BRUISING OR BLEEDING . *URINARY PROBLEMS (pain or burning when urinating, or frequent urination) . *BOWEL PROBLEMS (unusual diarrhea, constipation, pain near the anus) . TENDERNESS IN MOUTH AND THROAT WITH OR WITHOUT PRESENCE OF ULCERS (sore throat, sores in mouth, or a toothache) . UNUSUAL RASH, SWELLING OR PAIN  . UNUSUAL VAGINAL DISCHARGE OR ITCHING   Items with * indicate a potential emergency and should be followed up as soon as possible or go to the Emergency Department if any problems should occur.  Please show the CHEMOTHERAPY ALERT CARD or IMMUNOTHERAPY ALERT  CARD at check-in to the Emergency Department and triage nurse.  Should you have questions after your visit or need to cancel or reschedule your appointment, please contact [facility]  Dept: [phone]  and follow the prompts.  Office hours are 8:00 a.m. to 4:30 p.m. Monday - Friday. Please note that voicemails left after 4:00 p.m. may not be returned until the following business day.  We are closed weekends and major holidays. You have access to a nurse at all times for urgent questions. Please call the main number to the clinic Dept: [phone] and follow the prompts.   For any non-urgent questions, you may also contact your provider using MyChart. We now offer e-Visits for anyone 18 and older to request care online for non-urgent symptoms. For details visit [email or web address].   Also download the MyChart app! Go to the app store, search "MyChart", open the app, select [facility], and log in with your MyChart username and password.  Due to Covid, a mask is required upon entering the hospital/clinic. If you do not have a mask, one will be given to you upon arrival. For doctor visits, patients may have 1 support person aged 18 or older with them. For treatment visits, patients cannot have anyone with them due to current Covid guidelines and our immunocompromised population.   

## 2021-03-07 ENCOUNTER — Encounter: Payer: Self-pay | Admitting: Hematology

## 2021-03-08 ENCOUNTER — Encounter: Payer: Self-pay | Admitting: Hematology

## 2021-03-08 ENCOUNTER — Other Ambulatory Visit: Payer: Self-pay

## 2021-03-08 DIAGNOSIS — C9000 Multiple myeloma not having achieved remission: Secondary | ICD-10-CM

## 2021-03-13 ENCOUNTER — Encounter: Payer: Self-pay | Admitting: Hematology

## 2021-03-13 MED ORDER — TRAZODONE HCL 50 MG PO TABS: 50.0000 mg | ORAL_TABLET | Freq: Every evening | ORAL | 0 refills | Status: DC | PRN

## 2021-03-19 ENCOUNTER — Other Ambulatory Visit: Payer: Self-pay

## 2021-03-19 DIAGNOSIS — C9000 Multiple myeloma not having achieved remission: Secondary | ICD-10-CM

## 2021-03-20 ENCOUNTER — Inpatient Hospital Stay: Payer: Medicare Other

## 2021-03-20 ENCOUNTER — Other Ambulatory Visit: Payer: Self-pay

## 2021-03-20 ENCOUNTER — Other Ambulatory Visit: Payer: Self-pay | Admitting: Hematology

## 2021-03-20 VITALS — BP 129/83 | HR 74 | Temp 98.2°F | Resp 18 | Ht 71.0 in | Wt 194.5 lb

## 2021-03-20 DIAGNOSIS — C9000 Multiple myeloma not having achieved remission: Secondary | ICD-10-CM

## 2021-03-20 DIAGNOSIS — Z95828 Presence of other vascular implants and grafts: Secondary | ICD-10-CM

## 2021-03-20 DIAGNOSIS — Z79899 Other long term (current) drug therapy: Secondary | ICD-10-CM | POA: Diagnosis not present

## 2021-03-20 DIAGNOSIS — Z5112 Encounter for antineoplastic immunotherapy: Secondary | ICD-10-CM | POA: Diagnosis not present

## 2021-03-20 DIAGNOSIS — Z7189 Other specified counseling: Secondary | ICD-10-CM

## 2021-03-20 LAB — CMP (CANCER CENTER ONLY)
ALT: 17 U/L (ref 0–44)
AST: 23 U/L (ref 15–41)
Albumin: 3.9 g/dL (ref 3.5–5.0)
Alkaline Phosphatase: 69 U/L (ref 38–126)
Anion gap: 8 (ref 5–15)
BUN: 13 mg/dL (ref 8–23)
CO2: 25 mmol/L (ref 22–32)
Calcium: 9.6 mg/dL (ref 8.9–10.3)
Chloride: 108 mmol/L (ref 98–111)
Creatinine: 1.34 mg/dL — ABNORMAL HIGH (ref 0.61–1.24)
GFR, Estimated: 56 mL/min — ABNORMAL LOW (ref >60)
Glucose, Bld: 110 mg/dL — ABNORMAL HIGH (ref 70–99)
Potassium: 4.3 mmol/L (ref 3.5–5.1)
Sodium: 141 mmol/L (ref 135–145)
Total Bilirubin: 0.6 mg/dL (ref 0.3–1.2)
Total Protein: 5.9 g/dL — ABNORMAL LOW (ref 6.5–8.1)

## 2021-03-20 LAB — CBC WITH DIFFERENTIAL (CANCER CENTER ONLY)
Abs Immature Granulocytes: 0 10*3/uL (ref 0.00–0.07)
Basophils Absolute: 0 10*3/uL (ref 0.0–0.1)
Basophils Relative: 1 %
Eosinophils Absolute: 0.1 10*3/uL (ref 0.0–0.5)
Eosinophils Relative: 3 %
HCT: 36.2 % — ABNORMAL LOW (ref 39.0–52.0)
Hemoglobin: 12.2 g/dL — ABNORMAL LOW (ref 13.0–17.0)
Immature Granulocytes: 0 %
Lymphocytes Relative: 30 %
Lymphs Abs: 1.4 10*3/uL (ref 0.7–4.0)
MCH: 35.7 pg — ABNORMAL HIGH (ref 26.0–34.0)
MCHC: 33.7 g/dL (ref 30.0–36.0)
MCV: 105.8 fL — ABNORMAL HIGH (ref 80.0–100.0)
Monocytes Absolute: 0.5 10*3/uL (ref 0.1–1.0)
Monocytes Relative: 10 %
Neutro Abs: 2.5 10*3/uL (ref 1.7–7.7)
Neutrophils Relative %: 56 %
Platelet Count: 169 10*3/uL (ref 150–400)
RBC: 3.42 MIL/uL — ABNORMAL LOW (ref 4.22–5.81)
RDW: 13.2 % (ref 11.5–15.5)
WBC Count: 4.5 10*3/uL (ref 4.0–10.5)
nRBC: 0 % (ref 0.0–0.2)

## 2021-03-20 MED ORDER — SODIUM CHLORIDE 0.9% FLUSH
10.0000 mL | INTRAVENOUS | Status: DC | PRN
  Administered 2021-03-20: 10 mL

## 2021-03-20 MED ORDER — SODIUM CHLORIDE 0.9 % IV SOLN
20.0000 mg | Freq: Once | INTRAVENOUS | Status: AC
  Administered 2021-03-20: 20 mg via INTRAVENOUS
  Filled 2021-03-20: qty 20

## 2021-03-20 MED ORDER — SODIUM CHLORIDE 0.9 % IV SOLN: Freq: Once | INTRAVENOUS | Status: AC

## 2021-03-20 MED ORDER — SODIUM CHLORIDE 0.9% FLUSH
10.0000 mL | INTRAVENOUS | Status: AC | PRN
  Administered 2021-03-20: 10 mL

## 2021-03-20 MED ORDER — HEPARIN SOD (PORK) LOCK FLUSH 100 UNIT/ML IV SOLN
500.0000 [IU] | Freq: Once | INTRAVENOUS | Status: AC | PRN
  Administered 2021-03-20: 500 [IU]

## 2021-03-20 MED ORDER — DEXTROSE 5 % IV SOLN
56.0000 mg/m2 | Freq: Once | INTRAVENOUS | Status: AC
  Administered 2021-03-20: 120 mg via INTRAVENOUS
  Filled 2021-03-20: qty 60

## 2021-03-20 MED ORDER — PROCHLORPERAZINE MALEATE 10 MG PO TABS
10.0000 mg | ORAL_TABLET | Freq: Once | ORAL | Status: AC
  Administered 2021-03-20: 10 mg via ORAL
  Filled 2021-03-20: qty 1

## 2021-03-24 LAB — MULTIPLE MYELOMA PANEL, SERUM
Albumin SerPl Elph-Mcnc: 3.6 g/dL (ref 2.9–4.4)
Albumin/Glob SerPl: 2.1 — ABNORMAL HIGH (ref 0.7–1.7)
Alpha 1: 0.2 g/dL (ref 0.0–0.4)
Alpha2 Glob SerPl Elph-Mcnc: 0.7 g/dL (ref 0.4–1.0)
B-Globulin SerPl Elph-Mcnc: 0.8 g/dL (ref 0.7–1.3)
Gamma Glob SerPl Elph-Mcnc: 0.1 g/dL — ABNORMAL LOW (ref 0.4–1.8)
Globulin, Total: 1.8 g/dL — ABNORMAL LOW (ref 2.2–3.9)
IgA: 11 mg/dL — ABNORMAL LOW (ref 61–437)
IgG (Immunoglobin G), Serum: 172 mg/dL — ABNORMAL LOW (ref 603–1613)
IgM (Immunoglobulin M), Srm: 8 mg/dL — ABNORMAL LOW (ref 15–143)
Total Protein ELP: 5.4 g/dL — ABNORMAL LOW (ref 6.0–8.5)

## 2021-03-27 DIAGNOSIS — C9000 Multiple myeloma not having achieved remission: Secondary | ICD-10-CM | POA: Diagnosis not present

## 2021-03-27 DIAGNOSIS — R0609 Other forms of dyspnea: Secondary | ICD-10-CM | POA: Diagnosis not present

## 2021-03-27 DIAGNOSIS — G629 Polyneuropathy, unspecified: Secondary | ICD-10-CM | POA: Diagnosis not present

## 2021-03-27 DIAGNOSIS — N183 Chronic kidney disease, stage 3 unspecified: Secondary | ICD-10-CM | POA: Diagnosis not present

## 2021-03-27 DIAGNOSIS — Z86718 Personal history of other venous thrombosis and embolism: Secondary | ICD-10-CM | POA: Diagnosis not present

## 2021-03-27 DIAGNOSIS — Z7901 Long term (current) use of anticoagulants: Secondary | ICD-10-CM | POA: Diagnosis not present

## 2021-03-27 DIAGNOSIS — G893 Neoplasm related pain (acute) (chronic): Secondary | ICD-10-CM | POA: Diagnosis not present

## 2021-03-27 DIAGNOSIS — Z23 Encounter for immunization: Secondary | ICD-10-CM | POA: Diagnosis not present

## 2021-03-27 DIAGNOSIS — Z9484 Stem cells transplant status: Secondary | ICD-10-CM | POA: Diagnosis not present

## 2021-03-27 DIAGNOSIS — G4700 Insomnia, unspecified: Secondary | ICD-10-CM | POA: Diagnosis not present

## 2021-03-30 ENCOUNTER — Encounter: Payer: Self-pay | Admitting: Hematology

## 2021-04-02 ENCOUNTER — Other Ambulatory Visit: Payer: Self-pay

## 2021-04-02 DIAGNOSIS — C9000 Multiple myeloma not having achieved remission: Secondary | ICD-10-CM

## 2021-04-02 MED ORDER — TRAMADOL HCL 50 MG PO TABS: ORAL_TABLET | ORAL | 0 refills | Status: DC

## 2021-04-02 MED FILL — Dexamethasone Sodium Phosphate Inj 100 MG/10ML: INTRAMUSCULAR | Qty: 2 | Status: AC

## 2021-04-03 ENCOUNTER — Inpatient Hospital Stay (HOSPITAL_BASED_OUTPATIENT_CLINIC_OR_DEPARTMENT_OTHER): Payer: Medicare Other | Admitting: Hematology

## 2021-04-03 ENCOUNTER — Ambulatory Visit: Payer: Medicare Other

## 2021-04-03 ENCOUNTER — Inpatient Hospital Stay: Payer: Medicare Other | Attending: Hematology

## 2021-04-03 ENCOUNTER — Other Ambulatory Visit: Payer: Self-pay

## 2021-04-03 VITALS — BP 129/80 | HR 70 | Temp 98.3°F | Resp 18 | Ht 71.0 in | Wt 195.3 lb

## 2021-04-03 DIAGNOSIS — Z79899 Other long term (current) drug therapy: Secondary | ICD-10-CM | POA: Diagnosis not present

## 2021-04-03 DIAGNOSIS — C9001 Multiple myeloma in remission: Secondary | ICD-10-CM | POA: Diagnosis not present

## 2021-04-03 DIAGNOSIS — Z5111 Encounter for antineoplastic chemotherapy: Secondary | ICD-10-CM

## 2021-04-03 DIAGNOSIS — Z7189 Other specified counseling: Secondary | ICD-10-CM

## 2021-04-03 DIAGNOSIS — Z5112 Encounter for antineoplastic immunotherapy: Secondary | ICD-10-CM | POA: Diagnosis not present

## 2021-04-03 DIAGNOSIS — C9000 Multiple myeloma not having achieved remission: Secondary | ICD-10-CM

## 2021-04-03 DIAGNOSIS — Z95828 Presence of other vascular implants and grafts: Secondary | ICD-10-CM

## 2021-04-03 LAB — CBC WITH DIFFERENTIAL (CANCER CENTER ONLY)
Abs Immature Granulocytes: 0 10*3/uL (ref 0.00–0.07)
Basophils Absolute: 0 10*3/uL (ref 0.0–0.1)
Basophils Relative: 1 %
Eosinophils Absolute: 0.2 10*3/uL (ref 0.0–0.5)
Eosinophils Relative: 4 %
HCT: 34.5 % — ABNORMAL LOW (ref 39.0–52.0)
Hemoglobin: 11.7 g/dL — ABNORMAL LOW (ref 13.0–17.0)
Immature Granulocytes: 0 %
Lymphocytes Relative: 27 %
Lymphs Abs: 1.1 10*3/uL (ref 0.7–4.0)
MCH: 35.8 pg — ABNORMAL HIGH (ref 26.0–34.0)
MCHC: 33.9 g/dL (ref 30.0–36.0)
MCV: 105.5 fL — ABNORMAL HIGH (ref 80.0–100.0)
Monocytes Absolute: 0.4 10*3/uL (ref 0.1–1.0)
Monocytes Relative: 9 %
Neutro Abs: 2.4 10*3/uL (ref 1.7–7.7)
Neutrophils Relative %: 59 %
Platelet Count: 175 10*3/uL (ref 150–400)
RBC: 3.27 MIL/uL — ABNORMAL LOW (ref 4.22–5.81)
RDW: 13 % (ref 11.5–15.5)
WBC Count: 4 10*3/uL (ref 4.0–10.5)
nRBC: 0 % (ref 0.0–0.2)

## 2021-04-03 LAB — CMP (CANCER CENTER ONLY)
ALT: 15 U/L (ref 0–44)
AST: 22 U/L (ref 15–41)
Albumin: 3.8 g/dL (ref 3.5–5.0)
Alkaline Phosphatase: 78 U/L (ref 38–126)
Anion gap: 10 (ref 5–15)
BUN: 15 mg/dL (ref 8–23)
CO2: 24 mmol/L (ref 22–32)
Calcium: 9.5 mg/dL (ref 8.9–10.3)
Chloride: 107 mmol/L (ref 98–111)
Creatinine: 1.34 mg/dL — ABNORMAL HIGH (ref 0.61–1.24)
GFR, Estimated: 56 mL/min — ABNORMAL LOW (ref >60)
Glucose, Bld: 137 mg/dL — ABNORMAL HIGH (ref 70–99)
Potassium: 4.1 mmol/L (ref 3.5–5.1)
Sodium: 141 mmol/L (ref 135–145)
Total Bilirubin: 0.7 mg/dL (ref 0.3–1.2)
Total Protein: 5.9 g/dL — ABNORMAL LOW (ref 6.5–8.1)

## 2021-04-03 MED ORDER — SODIUM CHLORIDE 0.9 % IV SOLN: Freq: Once | INTRAVENOUS | Status: AC

## 2021-04-03 MED ORDER — ACETAMINOPHEN 500 MG PO TABS
ORAL_TABLET | ORAL | Status: AC
  Filled 2021-04-03: qty 2

## 2021-04-03 MED ORDER — PROCHLORPERAZINE MALEATE 10 MG PO TABS
ORAL_TABLET | ORAL | Status: AC
  Filled 2021-04-03: qty 1

## 2021-04-03 MED ORDER — SODIUM CHLORIDE 0.9 % IV SOLN
10.0000 mg | Freq: Once | INTRAVENOUS | Status: AC
  Administered 2021-04-03: 10 mg via INTRAVENOUS
  Filled 2021-04-03: qty 10

## 2021-04-03 MED ORDER — HEPARIN SOD (PORK) LOCK FLUSH 100 UNIT/ML IV SOLN
500.0000 [IU] | Freq: Once | INTRAVENOUS | Status: AC | PRN
  Administered 2021-04-03: 500 [IU]

## 2021-04-03 MED ORDER — ACETAMINOPHEN 500 MG PO TABS
1000.0000 mg | ORAL_TABLET | Freq: Once | ORAL | Status: AC
  Administered 2021-04-03: 1000 mg via ORAL

## 2021-04-03 MED ORDER — DEXTROSE 5 % IV SOLN
56.0000 mg/m2 | Freq: Once | INTRAVENOUS | Status: AC
  Administered 2021-04-03: 120 mg via INTRAVENOUS
  Filled 2021-04-03: qty 60

## 2021-04-03 MED ORDER — SODIUM CHLORIDE 0.9% FLUSH
10.0000 mL | INTRAVENOUS | Status: AC | PRN
  Administered 2021-04-03: 10 mL

## 2021-04-03 MED ORDER — PROCHLORPERAZINE MALEATE 10 MG PO TABS
10.0000 mg | ORAL_TABLET | Freq: Once | ORAL | Status: AC
  Administered 2021-04-03: 10 mg via ORAL

## 2021-04-03 MED ORDER — SODIUM CHLORIDE 0.9% FLUSH
10.0000 mL | INTRAVENOUS | Status: DC | PRN
  Administered 2021-04-03: 10 mL

## 2021-04-03 NOTE — Patient Instructions (Signed)
[facility]  Discharge Instructions: Thank you for choosing [facility] to provide your oncology and hematology care.   If you have a lab appointment with the Cancer Center, please go directly to the Cancer Center and check in at the registration area.   Wear comfortable clothing and clothing appropriate for easy access to any Portacath or PICC line.   We strive to give you quality time with your provider. You may need to reschedule your appointment if you arrive late (15 or more minutes).  Arriving late affects you and other patients whose appointments are after yours.  Also, if you miss three or more appointments without notifying the office, you may be dismissed from the clinic at the provider's discretion.      For prescription refill requests, have your pharmacy contact our office and allow 72 hours for refills to be completed.    Today you received the following chemotherapy and/or immunotherapy agent: Carfilzomib (Kyprolis).    To help prevent nausea and vomiting after your treatment, we encourage you to take your nausea medication as directed.  BELOW ARE SYMPTOMS THAT SHOULD BE REPORTED IMMEDIATELY: *FEVER GREATER THAN 100.4 F (38 C) OR HIGHER *CHILLS OR SWEATING *NAUSEA AND VOMITING THAT IS NOT CONTROLLED WITH YOUR NAUSEA MEDICATION *UNUSUAL SHORTNESS OF BREATH *UNUSUAL BRUISING OR BLEEDING *URINARY PROBLEMS (pain or burning when urinating, or frequent urination) *BOWEL PROBLEMS (unusual diarrhea, constipation, pain near the anus) TENDERNESS IN MOUTH AND THROAT WITH OR WITHOUT PRESENCE OF ULCERS (sore throat, sores in mouth, or a toothache) UNUSUAL RASH, SWELLING OR PAIN  UNUSUAL VAGINAL DISCHARGE OR ITCHING   Items with * indicate a potential emergency and should be followed up as soon as possible or go to the Emergency Department if any problems should occur.  Please show the CHEMOTHERAPY ALERT CARD or IMMUNOTHERAPY ALERT CARD at  check-in to the Emergency Department and triage nurse.  Should you have questions after your visit or need to cancel or reschedule your appointment, please contact [facility]  Dept: [phone]  and follow the prompts.  Office hours are 8:00 a.m. to 4:30 p.m. Monday - Friday. Please note that voicemails left after 4:00 p.m. may not be returned until the following business day.  We are closed weekends and major holidays. You have access to a nurse at all times for urgent questions. Please call the main number to the clinic Dept: [phone] and follow the prompts.   For any non-urgent questions, you may also contact your provider using MyChart. We now offer e-Visits for anyone 18 and older to request care online for non-urgent symptoms. For details visit [email or web address].   Also download the MyChart app! Go to the app store, search "MyChart", open the app, select [facility], and log in with your MyChart username and password.  Due to Covid, a mask is required upon entering the hospital/clinic. If you do not have a mask, one will be given to you upon arrival. For doctor visits, patients may have 1 support person aged 18 or older with them. For treatment visits, patients cannot have anyone with them due to current Covid guidelines and our immunocompromised population.   

## 2021-04-04 ENCOUNTER — Other Ambulatory Visit: Payer: Self-pay | Admitting: Hematology

## 2021-04-04 ENCOUNTER — Telehealth: Payer: Self-pay | Admitting: Hematology

## 2021-04-04 DIAGNOSIS — C9000 Multiple myeloma not having achieved remission: Secondary | ICD-10-CM

## 2021-04-04 NOTE — Telephone Encounter (Signed)
Left message with follow-up appointments per 9/13 los. 

## 2021-04-05 ENCOUNTER — Encounter: Payer: Self-pay | Admitting: Hematology

## 2021-04-09 ENCOUNTER — Encounter: Payer: Self-pay | Admitting: Hematology

## 2021-04-09 LAB — MULTIPLE MYELOMA PANEL, SERUM
Albumin SerPl Elph-Mcnc: 3.6 g/dL (ref 2.9–4.4)
Albumin/Glob SerPl: 2.1 — ABNORMAL HIGH (ref 0.7–1.7)
Alpha 1: 0.2 g/dL (ref 0.0–0.4)
Alpha2 Glob SerPl Elph-Mcnc: 0.6 g/dL (ref 0.4–1.0)
B-Globulin SerPl Elph-Mcnc: 0.8 g/dL (ref 0.7–1.3)
Gamma Glob SerPl Elph-Mcnc: 0.2 g/dL — ABNORMAL LOW (ref 0.4–1.8)
Globulin, Total: 1.8 g/dL — ABNORMAL LOW (ref 2.2–3.9)
IgA: 12 mg/dL — ABNORMAL LOW (ref 61–437)
IgG (Immunoglobin G), Serum: 168 mg/dL — ABNORMAL LOW (ref 603–1613)
IgM (Immunoglobulin M), Srm: 10 mg/dL — ABNORMAL LOW (ref 15–143)
Total Protein ELP: 5.4 g/dL — ABNORMAL LOW (ref 6.0–8.5)
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
## 2021-04-16 MED FILL — Dexamethasone Sodium Phosphate Inj 100 MG/10ML: INTRAMUSCULAR | Qty: 1 | Status: AC

## 2021-04-17 ENCOUNTER — Encounter: Payer: Self-pay | Admitting: Hematology

## 2021-04-17 ENCOUNTER — Inpatient Hospital Stay: Payer: Medicare Other

## 2021-04-17 ENCOUNTER — Other Ambulatory Visit: Payer: Self-pay

## 2021-04-17 VITALS — BP 129/78 | HR 65 | Temp 98.2°F | Resp 16 | Wt 194.5 lb

## 2021-04-17 DIAGNOSIS — C9001 Multiple myeloma in remission: Secondary | ICD-10-CM | POA: Diagnosis not present

## 2021-04-17 DIAGNOSIS — C9000 Multiple myeloma not having achieved remission: Secondary | ICD-10-CM

## 2021-04-17 DIAGNOSIS — Z79899 Other long term (current) drug therapy: Secondary | ICD-10-CM | POA: Diagnosis not present

## 2021-04-17 DIAGNOSIS — Z95828 Presence of other vascular implants and grafts: Secondary | ICD-10-CM

## 2021-04-17 DIAGNOSIS — Z5112 Encounter for antineoplastic immunotherapy: Secondary | ICD-10-CM | POA: Diagnosis not present

## 2021-04-17 DIAGNOSIS — Z7189 Other specified counseling: Secondary | ICD-10-CM

## 2021-04-17 LAB — CBC WITH DIFFERENTIAL (CANCER CENTER ONLY)
Abs Immature Granulocytes: 0.01 10*3/uL (ref 0.00–0.07)
Basophils Absolute: 0 10*3/uL (ref 0.0–0.1)
Basophils Relative: 1 %
Eosinophils Absolute: 0.2 10*3/uL (ref 0.0–0.5)
Eosinophils Relative: 6 %
HCT: 33.6 % — ABNORMAL LOW (ref 39.0–52.0)
Hemoglobin: 11.6 g/dL — ABNORMAL LOW (ref 13.0–17.0)
Immature Granulocytes: 0 %
Lymphocytes Relative: 29 %
Lymphs Abs: 1.1 10*3/uL (ref 0.7–4.0)
MCH: 36 pg — ABNORMAL HIGH (ref 26.0–34.0)
MCHC: 34.5 g/dL (ref 30.0–36.0)
MCV: 104.3 fL — ABNORMAL HIGH (ref 80.0–100.0)
Monocytes Absolute: 0.4 10*3/uL (ref 0.1–1.0)
Monocytes Relative: 11 %
Neutro Abs: 2 10*3/uL (ref 1.7–7.7)
Neutrophils Relative %: 53 %
Platelet Count: 165 10*3/uL (ref 150–400)
RBC: 3.22 MIL/uL — ABNORMAL LOW (ref 4.22–5.81)
RDW: 13.3 % (ref 11.5–15.5)
WBC Count: 3.6 10*3/uL — ABNORMAL LOW (ref 4.0–10.5)
nRBC: 0 % (ref 0.0–0.2)

## 2021-04-17 LAB — CMP (CANCER CENTER ONLY)
ALT: 14 U/L (ref 0–44)
AST: 21 U/L (ref 15–41)
Albumin: 3.9 g/dL (ref 3.5–5.0)
Alkaline Phosphatase: 67 U/L (ref 38–126)
Anion gap: 10 (ref 5–15)
BUN: 16 mg/dL (ref 8–23)
CO2: 23 mmol/L (ref 22–32)
Calcium: 9.6 mg/dL (ref 8.9–10.3)
Chloride: 106 mmol/L (ref 98–111)
Creatinine: 1.5 mg/dL — ABNORMAL HIGH (ref 0.61–1.24)
GFR, Estimated: 49 mL/min — ABNORMAL LOW (ref >60)
Glucose, Bld: 136 mg/dL — ABNORMAL HIGH (ref 70–99)
Potassium: 4.2 mmol/L (ref 3.5–5.1)
Sodium: 139 mmol/L (ref 135–145)
Total Bilirubin: 0.7 mg/dL (ref 0.3–1.2)
Total Protein: 5.8 g/dL — ABNORMAL LOW (ref 6.5–8.1)

## 2021-04-17 MED ORDER — SODIUM CHLORIDE 0.9% FLUSH
10.0000 mL | INTRAVENOUS | Status: AC | PRN
  Administered 2021-04-17: 10 mL

## 2021-04-17 MED ORDER — HEPARIN SOD (PORK) LOCK FLUSH 100 UNIT/ML IV SOLN
500.0000 [IU] | Freq: Once | INTRAVENOUS | Status: AC | PRN
  Administered 2021-04-17: 500 [IU]

## 2021-04-17 MED ORDER — DEXTROSE 5 % IV SOLN
56.0000 mg/m2 | Freq: Once | INTRAVENOUS | Status: AC
  Administered 2021-04-17: 120 mg via INTRAVENOUS
  Filled 2021-04-17: qty 60

## 2021-04-17 MED ORDER — SODIUM CHLORIDE 0.9% FLUSH
10.0000 mL | INTRAVENOUS | Status: DC | PRN
  Administered 2021-04-17: 10 mL

## 2021-04-17 MED ORDER — SODIUM CHLORIDE 0.9 % IV SOLN
10.0000 mg | Freq: Once | INTRAVENOUS | Status: AC
  Administered 2021-04-17: 10 mg via INTRAVENOUS
  Filled 2021-04-17: qty 10

## 2021-04-17 MED ORDER — PROCHLORPERAZINE MALEATE 10 MG PO TABS
10.0000 mg | ORAL_TABLET | Freq: Once | ORAL | Status: AC
  Administered 2021-04-17: 10 mg via ORAL
  Filled 2021-04-17: qty 1

## 2021-04-17 MED ORDER — ACETAMINOPHEN 500 MG PO TABS
1000.0000 mg | ORAL_TABLET | Freq: Once | ORAL | Status: AC
  Administered 2021-04-17: 1000 mg via ORAL
  Filled 2021-04-17: qty 2

## 2021-04-17 MED ORDER — SODIUM CHLORIDE 0.9 % IV SOLN: Freq: Once | INTRAVENOUS | Status: AC

## 2021-04-19 ENCOUNTER — Telehealth: Payer: Self-pay | Admitting: Hematology

## 2021-04-19 NOTE — Telephone Encounter (Signed)
R/s 11/22 appt per patient request. Called and spoke with patient. Confirmed new date and time

## 2021-04-21 LAB — MULTIPLE MYELOMA PANEL, SERUM
Albumin SerPl Elph-Mcnc: 3.5 g/dL (ref 2.9–4.4)
Albumin/Glob SerPl: 2 — ABNORMAL HIGH (ref 0.7–1.7)
Alpha 1: 0.2 g/dL (ref 0.0–0.4)
Alpha2 Glob SerPl Elph-Mcnc: 0.6 g/dL (ref 0.4–1.0)
B-Globulin SerPl Elph-Mcnc: 0.8 g/dL (ref 0.7–1.3)
Gamma Glob SerPl Elph-Mcnc: 0.2 g/dL — ABNORMAL LOW (ref 0.4–1.8)
Globulin, Total: 1.8 g/dL — ABNORMAL LOW (ref 2.2–3.9)
IgA: 16 mg/dL — ABNORMAL LOW (ref 61–437)
IgG (Immunoglobin G), Serum: 203 mg/dL — ABNORMAL LOW (ref 603–1613)
IgM (Immunoglobulin M), Srm: 31 mg/dL (ref 15–143)
Total Protein ELP: 5.3 g/dL — ABNORMAL LOW (ref 6.0–8.5)

## 2021-04-26 ENCOUNTER — Encounter: Payer: Self-pay | Admitting: Hematology

## 2021-04-26 ENCOUNTER — Other Ambulatory Visit: Payer: Self-pay

## 2021-04-26 ENCOUNTER — Encounter (INDEPENDENT_AMBULATORY_CARE_PROVIDER_SITE_OTHER): Payer: Medicare Other | Admitting: Ophthalmology

## 2021-04-26 DIAGNOSIS — H35033 Hypertensive retinopathy, bilateral: Secondary | ICD-10-CM

## 2021-04-26 DIAGNOSIS — H43813 Vitreous degeneration, bilateral: Secondary | ICD-10-CM

## 2021-04-26 DIAGNOSIS — H353121 Nonexudative age-related macular degeneration, left eye, early dry stage: Secondary | ICD-10-CM | POA: Diagnosis not present

## 2021-04-26 DIAGNOSIS — I10 Essential (primary) hypertension: Secondary | ICD-10-CM

## 2021-04-26 DIAGNOSIS — H33302 Unspecified retinal break, left eye: Secondary | ICD-10-CM | POA: Diagnosis not present

## 2021-04-26 DIAGNOSIS — C9000 Multiple myeloma not having achieved remission: Secondary | ICD-10-CM

## 2021-04-27 ENCOUNTER — Encounter: Payer: Self-pay | Admitting: Hematology

## 2021-04-27 MED ORDER — TRAMADOL HCL 50 MG PO TABS: ORAL_TABLET | ORAL | 0 refills | Status: DC

## 2021-04-27 MED ORDER — TRAZODONE HCL 50 MG PO TABS: 50.0000 mg | ORAL_TABLET | Freq: Every evening | ORAL | 0 refills | Status: DC | PRN

## 2021-04-30 ENCOUNTER — Encounter: Payer: Self-pay | Admitting: Internal Medicine

## 2021-04-30 DIAGNOSIS — Z961 Presence of intraocular lens: Secondary | ICD-10-CM | POA: Diagnosis not present

## 2021-04-30 DIAGNOSIS — H524 Presbyopia: Secondary | ICD-10-CM | POA: Diagnosis not present

## 2021-04-30 DIAGNOSIS — H04123 Dry eye syndrome of bilateral lacrimal glands: Secondary | ICD-10-CM | POA: Diagnosis not present

## 2021-04-30 DIAGNOSIS — H35033 Hypertensive retinopathy, bilateral: Secondary | ICD-10-CM | POA: Diagnosis not present

## 2021-04-30 DIAGNOSIS — H33312 Horseshoe tear of retina without detachment, left eye: Secondary | ICD-10-CM | POA: Diagnosis not present

## 2021-04-30 DIAGNOSIS — H26491 Other secondary cataract, right eye: Secondary | ICD-10-CM | POA: Diagnosis not present

## 2021-04-30 MED FILL — Dexamethasone Sodium Phosphate Inj 100 MG/10ML: INTRAMUSCULAR | Qty: 1 | Status: AC

## 2021-05-01 ENCOUNTER — Inpatient Hospital Stay: Payer: Medicare Other | Attending: Hematology

## 2021-05-01 ENCOUNTER — Other Ambulatory Visit: Payer: Self-pay | Admitting: Hematology

## 2021-05-01 ENCOUNTER — Other Ambulatory Visit: Payer: Self-pay

## 2021-05-01 ENCOUNTER — Inpatient Hospital Stay: Payer: Medicare Other

## 2021-05-01 ENCOUNTER — Encounter: Payer: Self-pay | Admitting: Internal Medicine

## 2021-05-01 VITALS — BP 115/79 | HR 70 | Temp 98.5°F | Resp 18 | Wt 193.0 lb

## 2021-05-01 DIAGNOSIS — C9000 Multiple myeloma not having achieved remission: Secondary | ICD-10-CM

## 2021-05-01 DIAGNOSIS — Z79899 Other long term (current) drug therapy: Secondary | ICD-10-CM | POA: Diagnosis not present

## 2021-05-01 DIAGNOSIS — C9001 Multiple myeloma in remission: Secondary | ICD-10-CM | POA: Diagnosis not present

## 2021-05-01 DIAGNOSIS — Z5112 Encounter for antineoplastic immunotherapy: Secondary | ICD-10-CM | POA: Insufficient documentation

## 2021-05-01 DIAGNOSIS — Z7189 Other specified counseling: Secondary | ICD-10-CM

## 2021-05-01 DIAGNOSIS — Z95828 Presence of other vascular implants and grafts: Secondary | ICD-10-CM

## 2021-05-01 LAB — CBC WITH DIFFERENTIAL (CANCER CENTER ONLY)
Abs Immature Granulocytes: 0 10*3/uL (ref 0.00–0.07)
Basophils Absolute: 0 10*3/uL (ref 0.0–0.1)
Basophils Relative: 1 %
Eosinophils Absolute: 0.2 10*3/uL (ref 0.0–0.5)
Eosinophils Relative: 5 %
HCT: 34.6 % — ABNORMAL LOW (ref 39.0–52.0)
Hemoglobin: 11.6 g/dL — ABNORMAL LOW (ref 13.0–17.0)
Immature Granulocytes: 0 %
Lymphocytes Relative: 32 %
Lymphs Abs: 1.1 10*3/uL (ref 0.7–4.0)
MCH: 35.6 pg — ABNORMAL HIGH (ref 26.0–34.0)
MCHC: 33.5 g/dL (ref 30.0–36.0)
MCV: 106.1 fL — ABNORMAL HIGH (ref 80.0–100.0)
Monocytes Absolute: 0.4 10*3/uL (ref 0.1–1.0)
Monocytes Relative: 11 %
Neutro Abs: 1.8 10*3/uL (ref 1.7–7.7)
Neutrophils Relative %: 51 %
Platelet Count: 173 10*3/uL (ref 150–400)
RBC: 3.26 MIL/uL — ABNORMAL LOW (ref 4.22–5.81)
RDW: 13.5 % (ref 11.5–15.5)
WBC Count: 3.4 10*3/uL — ABNORMAL LOW (ref 4.0–10.5)
nRBC: 0 % (ref 0.0–0.2)

## 2021-05-01 LAB — CMP (CANCER CENTER ONLY)
ALT: 16 U/L (ref 0–44)
AST: 25 U/L (ref 15–41)
Albumin: 3.9 g/dL (ref 3.5–5.0)
Alkaline Phosphatase: 70 U/L (ref 38–126)
Anion gap: 9 (ref 5–15)
BUN: 16 mg/dL (ref 8–23)
CO2: 23 mmol/L (ref 22–32)
Calcium: 9.5 mg/dL (ref 8.9–10.3)
Chloride: 107 mmol/L (ref 98–111)
Creatinine: 1.43 mg/dL — ABNORMAL HIGH (ref 0.61–1.24)
GFR, Estimated: 51 mL/min — ABNORMAL LOW (ref >60)
Glucose, Bld: 124 mg/dL — ABNORMAL HIGH (ref 70–99)
Potassium: 4 mmol/L (ref 3.5–5.1)
Sodium: 139 mmol/L (ref 135–145)
Total Bilirubin: 0.6 mg/dL (ref 0.3–1.2)
Total Protein: 6 g/dL — ABNORMAL LOW (ref 6.5–8.1)

## 2021-05-01 MED ORDER — SODIUM CHLORIDE 0.9 % IV SOLN: Freq: Once | INTRAVENOUS | Status: AC

## 2021-05-01 MED ORDER — SODIUM CHLORIDE 0.9% FLUSH
10.0000 mL | Freq: Once | INTRAVENOUS | Status: AC
Start: 2021-05-01 — End: 2021-05-01
  Administered 2021-05-01: 10 mL

## 2021-05-01 MED ORDER — HEPARIN SOD (PORK) LOCK FLUSH 100 UNIT/ML IV SOLN
500.0000 [IU] | Freq: Once | INTRAVENOUS | Status: AC | PRN
  Administered 2021-05-01: 500 [IU]

## 2021-05-01 MED ORDER — SODIUM CHLORIDE 0.9% FLUSH
10.0000 mL | INTRAVENOUS | Status: DC | PRN
  Administered 2021-05-01: 10 mL

## 2021-05-01 MED ORDER — ACETAMINOPHEN 500 MG PO TABS
1000.0000 mg | ORAL_TABLET | Freq: Once | ORAL | Status: AC
  Administered 2021-05-01: 1000 mg via ORAL
  Filled 2021-05-01: qty 2

## 2021-05-01 MED ORDER — PROCHLORPERAZINE MALEATE 10 MG PO TABS
10.0000 mg | ORAL_TABLET | Freq: Once | ORAL | Status: AC
  Administered 2021-05-01: 10 mg via ORAL
  Filled 2021-05-01: qty 1

## 2021-05-01 MED ORDER — ACETAMINOPHEN 500 MG PO TABS
ORAL_TABLET | ORAL | Status: AC
  Administered 2021-05-01: 500 mg via ORAL
  Filled 2021-05-01: qty 1

## 2021-05-01 MED ORDER — SODIUM CHLORIDE 0.9 % IV SOLN
10.0000 mg | Freq: Once | INTRAVENOUS | Status: AC
  Administered 2021-05-01: 10 mg via INTRAVENOUS
  Filled 2021-05-01: qty 10

## 2021-05-01 MED ORDER — DEXTROSE 5 % IV SOLN
56.0000 mg/m2 | Freq: Once | INTRAVENOUS | Status: AC
  Administered 2021-05-01: 120 mg via INTRAVENOUS
  Filled 2021-05-01: qty 60

## 2021-05-01 NOTE — Patient Instructions (Signed)
[facility] MEDICAL ONCOLOGY  Discharge Instructions: Thank you for choosing [facility] to provide your oncology and hematology care.   If you have a lab appointment with the Cancer Center, please go directly to the Cancer Center and check in at the registration area.   Wear comfortable clothing and clothing appropriate for easy access to any Portacath or PICC line.   We strive to give you quality time with your provider. You may need to reschedule your appointment if you arrive late (15 or more minutes).  Arriving late affects you and other patients whose appointments are after yours.  Also, if you miss three or more appointments without notifying the office, you may be dismissed from the clinic at the provider's discretion.      For prescription refill requests, have your pharmacy contact our office and allow 72 hours for refills to be completed.    Today you received the following chemotherapy and/or immunotherapy agents kyprolis      To help prevent nausea and vomiting after your treatment, we encourage you to take your nausea medication as directed.  BELOW ARE SYMPTOMS THAT SHOULD BE REPORTED IMMEDIATELY: . *FEVER GREATER THAN 100.4 F (38 C) OR HIGHER . *CHILLS OR SWEATING . *NAUSEA AND VOMITING THAT IS NOT CONTROLLED WITH YOUR NAUSEA MEDICATION . *UNUSUAL SHORTNESS OF BREATH . *UNUSUAL BRUISING OR BLEEDING . *URINARY PROBLEMS (pain or burning when urinating, or frequent urination) . *BOWEL PROBLEMS (unusual diarrhea, constipation, pain near the anus) . TENDERNESS IN MOUTH AND THROAT WITH OR WITHOUT PRESENCE OF ULCERS (sore throat, sores in mouth, or a toothache) . UNUSUAL RASH, SWELLING OR PAIN  . UNUSUAL VAGINAL DISCHARGE OR ITCHING   Items with * indicate a potential emergency and should be followed up as soon as possible or go to the Emergency Department if any problems should occur.  Please show the CHEMOTHERAPY ALERT CARD or IMMUNOTHERAPY ALERT  CARD at check-in to the Emergency Department and triage nurse.  Should you have questions after your visit or need to cancel or reschedule your appointment, please contact [facility] MEDICAL ONCOLOGY  Dept: [phone]  and follow the prompts.  Office hours are 8:00 a.m. to 4:30 p.m. Monday - Friday. Please note that voicemails left after 4:00 p.m. may not be returned until the following business day.  We are closed weekends and major holidays. You have access to a nurse at all times for urgent questions. Please call the main number to the clinic Dept: [phone] and follow the prompts.   For any non-urgent questions, you may also contact your provider using MyChart. We now offer e-Visits for anyone 18 and older to request care online for non-urgent symptoms. For details visit [email or web address].   Also download the MyChart app! Go to the app store, search "MyChart", open the app, select [facility], and log in with your MyChart username and password.  Due to Covid, a mask is required upon entering the hospital/clinic. If you do not have a mask, one will be given to you upon arrival. For doctor visits, patients may have 1 support person aged 18 or older with them. For treatment visits, patients cannot have anyone with them due to current Covid guidelines and our immunocompromised population.   

## 2021-05-01 NOTE — Progress Notes (Signed)
Per Dr. Irene Limbo, hold aredia today per pt having a crown place at the end of November. Dental work not completed.  Pt dropped one of his tylenol tabs in his drink.  Extra tylenol given as per MD order.  Medication verified with RN Tammi Sou.

## 2021-05-07 LAB — MULTIPLE MYELOMA PANEL, SERUM
Albumin SerPl Elph-Mcnc: 3.4 g/dL (ref 2.9–4.4)
Albumin/Glob SerPl: 1.8 — ABNORMAL HIGH (ref 0.7–1.7)
Alpha 1: 0.2 g/dL (ref 0.0–0.4)
Alpha2 Glob SerPl Elph-Mcnc: 0.7 g/dL (ref 0.4–1.0)
B-Globulin SerPl Elph-Mcnc: 0.9 g/dL (ref 0.7–1.3)
Gamma Glob SerPl Elph-Mcnc: 0.2 g/dL — ABNORMAL LOW (ref 0.4–1.8)
Globulin, Total: 2 g/dL — ABNORMAL LOW (ref 2.2–3.9)
IgA: 10 mg/dL — ABNORMAL LOW (ref 61–437)
IgG (Immunoglobin G), Serum: 196 mg/dL — ABNORMAL LOW (ref 603–1613)
IgM (Immunoglobulin M), Srm: 19 mg/dL (ref 15–143)
Total Protein ELP: 5.4 g/dL — ABNORMAL LOW (ref 6.0–8.5)

## 2021-05-15 ENCOUNTER — Other Ambulatory Visit: Payer: Medicare Other

## 2021-05-15 ENCOUNTER — Ambulatory Visit: Payer: Medicare Other

## 2021-05-15 ENCOUNTER — Inpatient Hospital Stay: Payer: Medicare Other

## 2021-05-15 ENCOUNTER — Other Ambulatory Visit: Payer: Self-pay | Admitting: Hematology

## 2021-05-15 ENCOUNTER — Other Ambulatory Visit: Payer: Self-pay

## 2021-05-15 VITALS — BP 133/78 | HR 72 | Temp 98.2°F | Resp 18

## 2021-05-15 DIAGNOSIS — Z95828 Presence of other vascular implants and grafts: Secondary | ICD-10-CM

## 2021-05-15 DIAGNOSIS — C9001 Multiple myeloma in remission: Secondary | ICD-10-CM | POA: Diagnosis not present

## 2021-05-15 DIAGNOSIS — Z7189 Other specified counseling: Secondary | ICD-10-CM

## 2021-05-15 DIAGNOSIS — Z79899 Other long term (current) drug therapy: Secondary | ICD-10-CM | POA: Diagnosis not present

## 2021-05-15 DIAGNOSIS — C9000 Multiple myeloma not having achieved remission: Secondary | ICD-10-CM

## 2021-05-15 DIAGNOSIS — Z5112 Encounter for antineoplastic immunotherapy: Secondary | ICD-10-CM | POA: Diagnosis not present

## 2021-05-15 LAB — CMP (CANCER CENTER ONLY)
ALT: 15 U/L (ref 0–44)
AST: 23 U/L (ref 15–41)
Albumin: 4.1 g/dL (ref 3.5–5.0)
Alkaline Phosphatase: 73 U/L (ref 38–126)
Anion gap: 9 (ref 5–15)
BUN: 16 mg/dL (ref 8–23)
CO2: 25 mmol/L (ref 22–32)
Calcium: 9.7 mg/dL (ref 8.9–10.3)
Chloride: 106 mmol/L (ref 98–111)
Creatinine: 1.33 mg/dL — ABNORMAL HIGH (ref 0.61–1.24)
GFR, Estimated: 56 mL/min — ABNORMAL LOW (ref >60)
Glucose, Bld: 80 mg/dL (ref 70–99)
Potassium: 4.3 mmol/L (ref 3.5–5.1)
Sodium: 140 mmol/L (ref 135–145)
Total Bilirubin: 0.7 mg/dL (ref 0.3–1.2)
Total Protein: 6.1 g/dL — ABNORMAL LOW (ref 6.5–8.1)

## 2021-05-15 LAB — CBC WITH DIFFERENTIAL (CANCER CENTER ONLY)
Abs Immature Granulocytes: 0.01 10*3/uL (ref 0.00–0.07)
Basophils Absolute: 0 10*3/uL (ref 0.0–0.1)
Basophils Relative: 1 %
Eosinophils Absolute: 0.1 10*3/uL (ref 0.0–0.5)
Eosinophils Relative: 2 %
HCT: 34.6 % — ABNORMAL LOW (ref 39.0–52.0)
Hemoglobin: 11.6 g/dL — ABNORMAL LOW (ref 13.0–17.0)
Immature Granulocytes: 0 %
Lymphocytes Relative: 34 %
Lymphs Abs: 1.5 10*3/uL (ref 0.7–4.0)
MCH: 35.4 pg — ABNORMAL HIGH (ref 26.0–34.0)
MCHC: 33.5 g/dL (ref 30.0–36.0)
MCV: 105.5 fL — ABNORMAL HIGH (ref 80.0–100.0)
Monocytes Absolute: 0.4 10*3/uL (ref 0.1–1.0)
Monocytes Relative: 9 %
Neutro Abs: 2.4 10*3/uL (ref 1.7–7.7)
Neutrophils Relative %: 54 %
Platelet Count: 172 10*3/uL (ref 150–400)
RBC: 3.28 MIL/uL — ABNORMAL LOW (ref 4.22–5.81)
RDW: 13.4 % (ref 11.5–15.5)
WBC Count: 4.4 10*3/uL (ref 4.0–10.5)
nRBC: 0 % (ref 0.0–0.2)

## 2021-05-15 MED ORDER — HEPARIN SOD (PORK) LOCK FLUSH 100 UNIT/ML IV SOLN
500.0000 [IU] | Freq: Once | INTRAVENOUS | Status: AC | PRN
  Administered 2021-05-15: 500 [IU]

## 2021-05-15 MED ORDER — SODIUM CHLORIDE 0.9% FLUSH
10.0000 mL | INTRAVENOUS | Status: AC | PRN
  Administered 2021-05-15: 10 mL

## 2021-05-15 MED ORDER — SODIUM CHLORIDE 0.9 % IV SOLN
10.0000 mg | Freq: Once | INTRAVENOUS | Status: AC
  Administered 2021-05-15: 10 mg via INTRAVENOUS
  Filled 2021-05-15: qty 10

## 2021-05-15 MED ORDER — SODIUM CHLORIDE 0.9 % IV SOLN: Freq: Once | INTRAVENOUS | Status: AC

## 2021-05-15 MED ORDER — ACETAMINOPHEN 500 MG PO TABS
1000.0000 mg | ORAL_TABLET | Freq: Once | ORAL | Status: AC
  Administered 2021-05-15: 1000 mg via ORAL
  Filled 2021-05-15: qty 2

## 2021-05-15 MED ORDER — SODIUM CHLORIDE 0.9% FLUSH
10.0000 mL | INTRAVENOUS | Status: DC | PRN
  Administered 2021-05-15: 10 mL

## 2021-05-15 MED ORDER — DEXTROSE 5 % IV SOLN
56.0000 mg/m2 | Freq: Once | INTRAVENOUS | Status: AC
  Administered 2021-05-15: 120 mg via INTRAVENOUS
  Filled 2021-05-15: qty 60

## 2021-05-15 MED ORDER — PROCHLORPERAZINE MALEATE 10 MG PO TABS
10.0000 mg | ORAL_TABLET | Freq: Once | ORAL | Status: AC
  Administered 2021-05-15: 10 mg via ORAL
  Filled 2021-05-15: qty 1

## 2021-05-15 NOTE — Progress Notes (Signed)
Patient had 2 crowns placed ~ 1 week ago.  [name] [place], [place], BCPS, BCOP 05/15/2021 2:37 PM

## 2021-05-15 NOTE — Patient Instructions (Signed)
[facility] MEDICAL ONCOLOGY  Discharge Instructions: Thank you for choosing [facility] to provide your oncology and hematology care.   If you have a lab appointment with the Cancer Center, please go directly to the Cancer Center and check in at the registration area.   Wear comfortable clothing and clothing appropriate for easy access to any Portacath or PICC line.   We strive to give you quality time with your provider. You may need to reschedule your appointment if you arrive late (15 or more minutes).  Arriving late affects you and other patients whose appointments are after yours.  Also, if you miss three or more appointments without notifying the office, you may be dismissed from the clinic at the provider's discretion.      For prescription refill requests, have your pharmacy contact our office and allow 72 hours for refills to be completed.    Today you received the following chemotherapy and/or immunotherapy agent: Carfilzomib (Kyprolis).    To help prevent nausea and vomiting after your treatment, we encourage you to take your nausea medication as directed.  BELOW ARE SYMPTOMS THAT SHOULD BE REPORTED IMMEDIATELY: *FEVER GREATER THAN 100.4 F (38 C) OR HIGHER *CHILLS OR SWEATING *NAUSEA AND VOMITING THAT IS NOT CONTROLLED WITH YOUR NAUSEA MEDICATION *UNUSUAL SHORTNESS OF BREATH *UNUSUAL BRUISING OR BLEEDING *URINARY PROBLEMS (pain or burning when urinating, or frequent urination) *BOWEL PROBLEMS (unusual diarrhea, constipation, pain near the anus) TENDERNESS IN MOUTH AND THROAT WITH OR WITHOUT PRESENCE OF ULCERS (sore throat, sores in mouth, or a toothache) UNUSUAL RASH, SWELLING OR PAIN  UNUSUAL VAGINAL DISCHARGE OR ITCHING   Items with * indicate a potential emergency and should be followed up as soon as possible or go to the Emergency Department if any problems should occur.  Please show the CHEMOTHERAPY ALERT CARD or IMMUNOTHERAPY ALERT CARD at  check-in to the Emergency Department and triage nurse.  Should you have questions after your visit or need to cancel or reschedule your appointment, please contact [facility] ONCOL[facility]  Dept: [phone]  and follow the prompts.  Office hours are 8:00 a.m. to 4:30 p.m. Monday - Friday. Please note that voicemails left after 4:00 p.m. may not be returned until the following business day.  We are closed weekends and major holidays. You have access to a nurse at all times for urgent questions. Please call the main number to the clinic Dept: [phone] and follow the prompts.   For any non-urgent questions, you may also contact your provider using MyChart. We now offer e-Visits for anyone 18 and older to request care online for non-urgent symptoms. For details visit [email or web address].   Also download the MyChart app! Go to the app store, search "MyChart", open the app, select [facility], and log in with your MyChart username and password.  Due to Covid, a mask is required upon entering the hospital/clinic. If you do not have a mask, one will be given to you upon arrival. For doctor visits, patients may have 1 support person aged 18 or older with them. For treatment visits, patients cannot have anyone with them due to current Covid guidelines and our immunocompromised population.   

## 2021-05-17 LAB — MULTIPLE MYELOMA PANEL, SERUM
Albumin SerPl Elph-Mcnc: 3.7 g/dL (ref 2.9–4.4)
Albumin/Glob SerPl: 1.9 — ABNORMAL HIGH (ref 0.7–1.7)
Alpha 1: 0.2 g/dL (ref 0.0–0.4)
Alpha2 Glob SerPl Elph-Mcnc: 0.7 g/dL (ref 0.4–1.0)
B-Globulin SerPl Elph-Mcnc: 0.9 g/dL (ref 0.7–1.3)
Gamma Glob SerPl Elph-Mcnc: 0.2 g/dL — ABNORMAL LOW (ref 0.4–1.8)
Globulin, Total: 2 g/dL — ABNORMAL LOW (ref 2.2–3.9)
IgA: 14 mg/dL — ABNORMAL LOW (ref 61–437)
IgG (Immunoglobin G), Serum: 196 mg/dL — ABNORMAL LOW (ref 603–1613)
IgM (Immunoglobulin M), Srm: 14 mg/dL — ABNORMAL LOW (ref 15–143)
Total Protein ELP: 5.7 g/dL — ABNORMAL LOW (ref 6.0–8.5)

## 2021-05-21 ENCOUNTER — Other Ambulatory Visit: Payer: Self-pay

## 2021-05-21 ENCOUNTER — Encounter: Payer: Self-pay | Admitting: Hematology

## 2021-05-21 DIAGNOSIS — C9000 Multiple myeloma not having achieved remission: Secondary | ICD-10-CM

## 2021-05-21 MED ORDER — TRAMADOL HCL 50 MG PO TABS: ORAL_TABLET | ORAL | 0 refills | Status: DC

## 2021-05-21 MED ORDER — TRAZODONE HCL 50 MG PO TABS: 50.0000 mg | ORAL_TABLET | Freq: Every evening | ORAL | 0 refills | Status: DC | PRN

## 2021-05-22 ENCOUNTER — Telehealth (INDEPENDENT_AMBULATORY_CARE_PROVIDER_SITE_OTHER): Payer: Medicare Other | Admitting: Internal Medicine

## 2021-05-22 ENCOUNTER — Telehealth: Payer: Self-pay | Admitting: Internal Medicine

## 2021-05-22 ENCOUNTER — Other Ambulatory Visit: Payer: Self-pay

## 2021-05-22 ENCOUNTER — Encounter: Payer: Self-pay | Admitting: Hematology

## 2021-05-22 DIAGNOSIS — U071 COVID-19: Secondary | ICD-10-CM | POA: Diagnosis not present

## 2021-05-22 MED ORDER — NIRMATRELVIR/RITONAVIR (PAXLOVID) TABLET (RENAL DOSING): 2.0000 | ORAL_TABLET | Freq: Two times a day (BID) | ORAL | 0 refills | Status: AC

## 2021-05-22 MED ORDER — TRAMADOL HCL 50 MG PO TABS: ORAL_TABLET | ORAL | 0 refills | Status: DC

## 2021-05-22 NOTE — Progress Notes (Signed)
   Subjective:    Patient ID: [name]., male    DOB: [birth year], 75 y.o.   MRN: [id number]  HPI 75 year old Male seen today for acute respiratory infection symptoms.  He has tested positive for COVID-19.  He has cough, sore throat, runny nose and fatigue.  Symptoms started as a sore throat on Sunday, October 30.  Apparently was exposed [place].  Several family members have come down with COVID-19.   Due to the Coronavirus pandemic, patient is seen today via interactive audio and video telecommunications.  He is identified using 2 identifiers as [name]., a patient in this practice.  He is agreeable to visit in this format today.  Patient is at his home and I am at my office.  Patient had a stem cell transplant for multiple myeloma in September 2021 at [facility].  Currently receives Carfilzomib at the cancer center  Immunization records reviewed.  He has been vaccinated for COVID-19 and last vaccine  He has history of mildly elevated serum creatinine consistent with chronic kidney disease.  Apparently was May 2022.  Patient had history of lumbar foraminal stenosis with history of L5-S1 anterior lumbar interbody fusion by Dr. Vertell Limber in August 2020.  In November 2020 he was complaining of recurrent back pain.  Dr. Vertell Limber ordered an MRI of the LS spine in early December 2020 and patient was found to have a 3.5 cm enhancing mass in the left sacrum, 15 mm enhancing mass in the right posterior iliac bone medially and these lesions were noted to be suspicious for malignancy.  He was referred to Dr. Elna Breslow and subsequently diagnosed with multiple myeloma.  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  He does not smoke.  Occasional alcohol consumption.    Review of Systems patient reports symptoms above     Objective:   Physical Exam  He is seen virtually.  There were some issues connecting virtually and we finally had to resort to telephone only  due to failure with video.  He gives a clear concise history.  Does not appear to be in acute distress based on our conversation and discussion of his symptoms.  Does have malaise and fatigue      Assessment & Plan:  Acute COVID-19 virus infection  Plan: He has a history of chronic kidney disease therefore we will need to use renal dose of Paxlovid.  He is agreeable to trying this.  He will rest and drink plenty of fluids and walk some around the house.  He will monitor pulse oximetry.  He will call if symptoms worsen.  Time spent reviewing chart, speaking with patient, medical decision making and E scribing medication is 20 minutes

## 2021-05-22 NOTE — Telephone Encounter (Signed)
Scheduled video visit 

## 2021-05-22 NOTE — Telephone Encounter (Signed)
scheduled

## 2021-05-22 NOTE — Telephone Encounter (Signed)
[name] [phone]  [name] called to say he has just tested positive for COVID, he has cough, sore throat, runny nose, sniffles and tired. He started out with sore throat on Sunday. His Cancer doctor wanted him to call PCP to be treated for COVID, to get the medication. He was exposed at a family Reunion at the beach, did not know at the time, but since being home has gotten some text with people that have come down with it. Has had COVID vaccines

## 2021-05-23 ENCOUNTER — Other Ambulatory Visit: Payer: Self-pay

## 2021-05-23 ENCOUNTER — Telehealth: Payer: Self-pay

## 2021-05-23 ENCOUNTER — Other Ambulatory Visit: Payer: Self-pay | Admitting: Hematology

## 2021-05-23 DIAGNOSIS — C9000 Multiple myeloma not having achieved remission: Secondary | ICD-10-CM

## 2021-05-23 NOTE — Telephone Encounter (Signed)
Returned call to pt. Pt tested positive for COVID. Pt s/s began 05/20/21. PCP has given pt antiviral. Per DR Irene Limbo pt to skip appointments on 11/9 and start back on 11/21. Pt acknowledged and verbalized understanding.

## 2021-05-24 ENCOUNTER — Other Ambulatory Visit: Payer: Self-pay

## 2021-05-24 DIAGNOSIS — C9000 Multiple myeloma not having achieved remission: Secondary | ICD-10-CM

## 2021-05-24 MED ORDER — ACYCLOVIR 800 MG PO TABS
800.0000 mg | ORAL_TABLET | Freq: Two times a day (BID) | ORAL | 2 refills | Status: DC
Start: 2021-05-24 — End: 2022-03-13

## 2021-05-25 ENCOUNTER — Telehealth: Payer: Self-pay | Admitting: Internal Medicine

## 2021-05-25 NOTE — Telephone Encounter (Signed)
Cloud results to [facility] [phone], phone [phone]  +COVID 05/22/2021

## 2021-05-27 ENCOUNTER — Encounter: Payer: Self-pay | Admitting: Internal Medicine

## 2021-05-27 NOTE — Patient Instructions (Signed)
You have been prescribed renal dose of Paxlovid for COVID-19 virus infection..  Please take as directed.  Monitor pulse oximetry.  Walk some.  Stay well-hydrated.  Call if symptoms worsen.

## 2021-05-28 ENCOUNTER — Telehealth: Payer: Self-pay

## 2021-05-28 NOTE — Telephone Encounter (Signed)
Patient wanted to pass along that he has finished his medication for covid and he is doing fine. No symptoms at all.

## 2021-05-29 ENCOUNTER — Other Ambulatory Visit: Payer: Medicare Other

## 2021-05-29 ENCOUNTER — Ambulatory Visit: Payer: Medicare Other | Admitting: Hematology

## 2021-05-29 ENCOUNTER — Ambulatory Visit: Payer: Medicare Other

## 2021-06-11 ENCOUNTER — Other Ambulatory Visit: Payer: Self-pay

## 2021-06-11 ENCOUNTER — Inpatient Hospital Stay: Payer: Medicare Other

## 2021-06-11 ENCOUNTER — Inpatient Hospital Stay: Payer: Medicare Other | Attending: Hematology

## 2021-06-11 ENCOUNTER — Inpatient Hospital Stay (HOSPITAL_BASED_OUTPATIENT_CLINIC_OR_DEPARTMENT_OTHER): Payer: Medicare Other | Admitting: Hematology

## 2021-06-11 ENCOUNTER — Other Ambulatory Visit: Payer: Medicare Other

## 2021-06-11 ENCOUNTER — Ambulatory Visit: Payer: Medicare Other | Admitting: Hematology

## 2021-06-11 VITALS — BP 121/82 | HR 83 | Temp 97.9°F | Resp 17 | Ht 71.0 in | Wt 192.5 lb

## 2021-06-11 DIAGNOSIS — C9001 Multiple myeloma in remission: Secondary | ICD-10-CM | POA: Insufficient documentation

## 2021-06-11 DIAGNOSIS — Z79899 Other long term (current) drug therapy: Secondary | ICD-10-CM | POA: Diagnosis not present

## 2021-06-11 DIAGNOSIS — Z5112 Encounter for antineoplastic immunotherapy: Secondary | ICD-10-CM | POA: Diagnosis not present

## 2021-06-11 DIAGNOSIS — C9000 Multiple myeloma not having achieved remission: Secondary | ICD-10-CM

## 2021-06-11 DIAGNOSIS — Z95828 Presence of other vascular implants and grafts: Secondary | ICD-10-CM

## 2021-06-11 DIAGNOSIS — Z7189 Other specified counseling: Secondary | ICD-10-CM

## 2021-06-11 LAB — CMP (CANCER CENTER ONLY)
ALT: 17 U/L (ref 0–44)
AST: 22 U/L (ref 15–41)
Albumin: 4.1 g/dL (ref 3.5–5.0)
Alkaline Phosphatase: 64 U/L (ref 38–126)
Anion gap: 8 (ref 5–15)
BUN: 12 mg/dL (ref 8–23)
CO2: 25 mmol/L (ref 22–32)
Calcium: 9.4 mg/dL (ref 8.9–10.3)
Chloride: 105 mmol/L (ref 98–111)
Creatinine: 1.24 mg/dL (ref 0.61–1.24)
GFR, Estimated: >60 mL/min (ref >60)
Glucose, Bld: 89 mg/dL (ref 70–99)
Potassium: 4.4 mmol/L (ref 3.5–5.1)
Sodium: 138 mmol/L (ref 135–145)
Total Bilirubin: 0.6 mg/dL (ref 0.3–1.2)
Total Protein: 5.9 g/dL — ABNORMAL LOW (ref 6.5–8.1)

## 2021-06-11 LAB — CBC WITH DIFFERENTIAL (CANCER CENTER ONLY)
Abs Immature Granulocytes: 0.01 10*3/uL (ref 0.00–0.07)
Basophils Absolute: 0 10*3/uL (ref 0.0–0.1)
Basophils Relative: 1 %
Eosinophils Absolute: 0.2 10*3/uL (ref 0.0–0.5)
Eosinophils Relative: 4 %
HCT: 35.7 % — ABNORMAL LOW (ref 39.0–52.0)
Hemoglobin: 11.9 g/dL — ABNORMAL LOW (ref 13.0–17.0)
Immature Granulocytes: 0 %
Lymphocytes Relative: 35 %
Lymphs Abs: 1.4 10*3/uL (ref 0.7–4.0)
MCH: 35.1 pg — ABNORMAL HIGH (ref 26.0–34.0)
MCHC: 33.3 g/dL (ref 30.0–36.0)
MCV: 105.3 fL — ABNORMAL HIGH (ref 80.0–100.0)
Monocytes Absolute: 0.4 10*3/uL (ref 0.1–1.0)
Monocytes Relative: 10 %
Neutro Abs: 2 10*3/uL (ref 1.7–7.7)
Neutrophils Relative %: 50 %
Platelet Count: 115 10*3/uL — ABNORMAL LOW (ref 150–400)
RBC: 3.39 MIL/uL — ABNORMAL LOW (ref 4.22–5.81)
RDW: 13.2 % (ref 11.5–15.5)
WBC Count: 4.1 10*3/uL (ref 4.0–10.5)
nRBC: 0 % (ref 0.0–0.2)

## 2021-06-11 MED ORDER — SODIUM CHLORIDE 0.9 % IV SOLN: Freq: Once | INTRAVENOUS | Status: AC

## 2021-06-11 MED ORDER — SODIUM CHLORIDE 0.9 % IV SOLN
10.0000 mg | Freq: Once | INTRAVENOUS | Status: DC
  Filled 2021-06-11: qty 1

## 2021-06-11 MED ORDER — ACETAMINOPHEN 500 MG PO TABS
1000.0000 mg | ORAL_TABLET | Freq: Once | ORAL | Status: AC
  Administered 2021-06-11: 1000 mg via ORAL
  Filled 2021-06-11: qty 2

## 2021-06-11 MED ORDER — PROCHLORPERAZINE MALEATE 10 MG PO TABS
10.0000 mg | ORAL_TABLET | Freq: Once | ORAL | Status: AC
  Administered 2021-06-11: 10 mg via ORAL
  Filled 2021-06-11: qty 1

## 2021-06-11 MED ORDER — HEPARIN SOD (PORK) LOCK FLUSH 100 UNIT/ML IV SOLN
500.0000 [IU] | Freq: Once | INTRAVENOUS | Status: AC | PRN
  Administered 2021-06-11: 500 [IU]

## 2021-06-11 MED ORDER — SODIUM CHLORIDE 0.9% FLUSH
10.0000 mL | INTRAVENOUS | Status: DC | PRN
  Administered 2021-06-11: 10 mL

## 2021-06-11 MED ORDER — SODIUM CHLORIDE 0.9% FLUSH
10.0000 mL | INTRAVENOUS | Status: DC | PRN
  Administered 2021-06-11: 10 mL via INTRAVENOUS

## 2021-06-11 MED ORDER — DEXTROSE 5 % IV SOLN
56.0000 mg/m2 | Freq: Once | INTRAVENOUS | Status: AC
  Administered 2021-06-11: 120 mg via INTRAVENOUS
  Filled 2021-06-11: qty 60

## 2021-06-11 MED ORDER — SODIUM CHLORIDE 0.9 % IV SOLN
10.0000 mg | Freq: Once | INTRAVENOUS | Status: AC
  Administered 2021-06-11: 10 mg via INTRAVENOUS
  Filled 2021-06-11: qty 1
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
## 2021-06-11 NOTE — Patient Instructions (Signed)
[facility] MEDICAL ONCOLOGY  Discharge Instructions: Thank you for choosing [facility] to provide your oncology and hematology care.   If you have a lab appointment with the Cancer Center, please go directly to the Cancer Center and check in at the registration area.   Wear comfortable clothing and clothing appropriate for easy access to any Portacath or PICC line.   We strive to give you quality time with your provider. You may need to reschedule your appointment if you arrive late (15 or more minutes).  Arriving late affects you and other patients whose appointments are after yours.  Also, if you miss three or more appointments without notifying the office, you may be dismissed from the clinic at the provider's discretion.      For prescription refill requests, have your pharmacy contact our office and allow 72 hours for refills to be completed.    Today you received the following chemotherapy and/or immunotherapy agents: Kyprolis    To help prevent nausea and vomiting after your treatment, we encourage you to take your nausea medication as directed.  BELOW ARE SYMPTOMS THAT SHOULD BE REPORTED IMMEDIATELY: . *FEVER GREATER THAN 100.4 F (38 C) OR HIGHER . *CHILLS OR SWEATING . *NAUSEA AND VOMITING THAT IS NOT CONTROLLED WITH YOUR NAUSEA MEDICATION . *UNUSUAL SHORTNESS OF BREATH . *UNUSUAL BRUISING OR BLEEDING . *URINARY PROBLEMS (pain or burning when urinating, or frequent urination) . *BOWEL PROBLEMS (unusual diarrhea, constipation, pain near the anus) . TENDERNESS IN MOUTH AND THROAT WITH OR WITHOUT PRESENCE OF ULCERS (sore throat, sores in mouth, or a toothache) . UNUSUAL RASH, SWELLING OR PAIN  . UNUSUAL VAGINAL DISCHARGE OR ITCHING   Items with * indicate a potential emergency and should be followed up as soon as possible or go to the Emergency Department if any problems should occur.  Please show the CHEMOTHERAPY ALERT CARD or IMMUNOTHERAPY ALERT  CARD at check-in to the Emergency Department and triage nurse.  Should you have questions after your visit or need to cancel or reschedule your appointment, please contact [facility] MEDICAL ONCOLOGY  Dept: [phone]  and follow the prompts.  Office hours are 8:00 a.m. to 4:30 p.m. Monday - Friday. Please note that voicemails left after 4:00 p.m. may not be returned until the following business day.  We are closed weekends and major holidays. You have access to a nurse at all times for urgent questions. Please call the main number to the clinic Dept: [phone] and follow the prompts.   For any non-urgent questions, you may also contact your provider using MyChart. We now offer e-Visits for anyone 18 and older to request care online for non-urgent symptoms. For details visit [email or web address].   Also download the MyChart app! Go to the app store, search "MyChart", open the app, select , and log in with your MyChart username and password.  Due to Covid, a mask is required upon entering the hospital/clinic. If you do not have a mask, one will be given to you upon arrival. For doctor visits, patients may have 1 support person aged 18 or older with them. For treatment visits, patients cannot have anyone with them due to current Covid guidelines and our immunocompromised population.   

## 2021-06-12 ENCOUNTER — Other Ambulatory Visit: Payer: Medicare Other

## 2021-06-12 ENCOUNTER — Ambulatory Visit: Payer: Medicare Other

## 2021-06-15 ENCOUNTER — Telehealth: Payer: Self-pay | Admitting: Hematology

## 2021-06-15 NOTE — Telephone Encounter (Signed)
Left message with follow-up appointments per 11/21 los.

## 2021-06-17 ENCOUNTER — Encounter: Payer: Self-pay | Admitting: Hematology

## 2021-06-18 ENCOUNTER — Other Ambulatory Visit: Payer: Self-pay

## 2021-06-18 ENCOUNTER — Encounter: Payer: Self-pay | Admitting: Hematology

## 2021-06-18 DIAGNOSIS — C9000 Multiple myeloma not having achieved remission: Secondary | ICD-10-CM

## 2021-06-18 LAB — MULTIPLE MYELOMA PANEL, SERUM
Albumin SerPl Elph-Mcnc: 3.7 g/dL (ref 2.9–4.4)
Albumin/Glob SerPl: 2 — ABNORMAL HIGH (ref 0.7–1.7)
Alpha 1: 0.2 g/dL (ref 0.0–0.4)
Alpha2 Glob SerPl Elph-Mcnc: 0.6 g/dL (ref 0.4–1.0)
B-Globulin SerPl Elph-Mcnc: 0.8 g/dL (ref 0.7–1.3)
Gamma Glob SerPl Elph-Mcnc: 0.2 g/dL — ABNORMAL LOW (ref 0.4–1.8)
Globulin, Total: 1.9 g/dL — ABNORMAL LOW (ref 2.2–3.9)
IgA: 21 mg/dL — ABNORMAL LOW (ref 61–437)
IgG (Immunoglobin G), Serum: 208 mg/dL — ABNORMAL LOW (ref 603–1613)
IgM (Immunoglobulin M), Srm: 13 mg/dL — ABNORMAL LOW (ref 15–143)
Total Protein ELP: 5.6 g/dL — ABNORMAL LOW (ref 6.0–8.5)

## 2021-06-18 MED ORDER — TRAMADOL HCL 50 MG PO TABS: 50.0000 mg | ORAL_TABLET | Freq: Four times a day (QID) | ORAL | 0 refills | Status: DC | PRN

## 2021-06-18 MED ORDER — TRAZODONE HCL 50 MG PO TABS: 50.0000 mg | ORAL_TABLET | Freq: Every evening | ORAL | 0 refills | Status: DC | PRN

## 2021-06-22 ENCOUNTER — Other Ambulatory Visit: Payer: Self-pay

## 2021-06-22 ENCOUNTER — Other Ambulatory Visit: Payer: Medicare Other | Admitting: Internal Medicine

## 2021-06-22 DIAGNOSIS — I10 Essential (primary) hypertension: Secondary | ICD-10-CM | POA: Diagnosis not present

## 2021-06-22 DIAGNOSIS — N1831 Chronic kidney disease, stage 3a: Secondary | ICD-10-CM

## 2021-06-22 DIAGNOSIS — R351 Nocturia: Secondary | ICD-10-CM

## 2021-06-22 DIAGNOSIS — Z8673 Personal history of transient ischemic attack (TIA), and cerebral infarction without residual deficits: Secondary | ICD-10-CM

## 2021-06-22 DIAGNOSIS — N401 Enlarged prostate with lower urinary tract symptoms: Secondary | ICD-10-CM

## 2021-06-22 NOTE — Addendum Note (Signed)
Addended by: Randolm Idol A on: 06/22/2021 09:33 AM   Modules accepted: Orders

## 2021-06-23 ENCOUNTER — Encounter: Payer: Self-pay | Admitting: Hematology

## 2021-06-23 LAB — CBC WITH DIFFERENTIAL/PLATELET
Absolute Monocytes: 436 cells/uL (ref 200–950)
Basophils Absolute: 40 cells/uL (ref 0–200)
Basophils Relative: 0.9 %
Eosinophils Absolute: 141 cells/uL (ref 15–500)
Eosinophils Relative: 3.2 %
HCT: 39.1 % (ref 38.5–50.0)
Hemoglobin: 12.7 g/dL — ABNORMAL LOW (ref 13.2–17.1)
Lymphs Abs: 1681 cells/uL (ref 850–3900)
MCH: 35 pg — ABNORMAL HIGH (ref 27.0–33.0)
MCHC: 32.5 g/dL (ref 32.0–36.0)
MCV: 107.7 fL — ABNORMAL HIGH (ref 80.0–100.0)
MPV: 10.5 fL (ref 7.5–12.5)
Monocytes Relative: 9.9 %
Neutro Abs: 2103 cells/uL (ref 1500–7800)
Neutrophils Relative %: 47.8 %
Platelets: 178 10*3/uL (ref 140–400)
RBC: 3.63 10*6/uL — ABNORMAL LOW (ref 4.20–5.80)
RDW: 12.4 % (ref 11.0–15.0)
Total Lymphocyte: 38.2 %
WBC: 4.4 10*3/uL (ref 3.8–10.8)

## 2021-06-23 LAB — COMPLETE METABOLIC PANEL WITH GFR
AG Ratio: 2.9 (calc) — ABNORMAL HIGH (ref 1.0–2.5)
ALT: 14 U/L (ref 9–46)
AST: 20 U/L (ref 10–35)
Albumin: 4.3 g/dL (ref 3.6–5.1)
Alkaline phosphatase (APISO): 62 U/L (ref 35–144)
BUN/Creatinine Ratio: 15 (calc) (ref 6–22)
BUN: 19 mg/dL (ref 7–25)
CO2: 28 mmol/L (ref 20–32)
Calcium: 9.7 mg/dL (ref 8.6–10.3)
Chloride: 105 mmol/L (ref 98–110)
Creat: 1.31 mg/dL — ABNORMAL HIGH (ref 0.70–1.28)
Globulin: 1.5 g/dL (calc) — ABNORMAL LOW (ref 1.9–3.7)
Glucose, Bld: 102 mg/dL — ABNORMAL HIGH (ref 65–99)
Potassium: 4.9 mmol/L (ref 3.5–5.3)
Sodium: 141 mmol/L (ref 135–146)
Total Bilirubin: 0.7 mg/dL (ref 0.2–1.2)
Total Protein: 5.8 g/dL — ABNORMAL LOW (ref 6.1–8.1)
eGFR: 57 mL/min/{1.73_m2} — ABNORMAL LOW (ref >=60)

## 2021-06-23 LAB — PSA: PSA: 0.69 ng/mL (ref <=4.00)

## 2021-06-23 LAB — LIPID PANEL
Cholesterol: 204 mg/dL — ABNORMAL HIGH (ref <200)
HDL: 74 mg/dL (ref >=40)
LDL Cholesterol (Calc): 112 mg/dL (calc) — ABNORMAL HIGH
Non-HDL Cholesterol (Calc): 130 mg/dL (calc) — ABNORMAL HIGH (ref <130)
Total CHOL/HDL Ratio: 2.8 (calc) (ref <5.0)
Triglycerides: 85 mg/dL (ref <150)

## 2021-06-25 DIAGNOSIS — M545 Low back pain, unspecified: Secondary | ICD-10-CM | POA: Diagnosis not present

## 2021-06-25 MED FILL — Dexamethasone Sodium Phosphate Inj 100 MG/10ML: INTRAMUSCULAR | Qty: 1 | Status: AC

## 2021-06-26 ENCOUNTER — Other Ambulatory Visit: Payer: Self-pay

## 2021-06-26 ENCOUNTER — Inpatient Hospital Stay: Payer: Medicare Other

## 2021-06-26 ENCOUNTER — Inpatient Hospital Stay: Payer: Medicare Other | Attending: Hematology

## 2021-06-26 VITALS — BP 120/75 | HR 70 | Temp 97.7°F | Resp 17

## 2021-06-26 DIAGNOSIS — C9001 Multiple myeloma in remission: Secondary | ICD-10-CM | POA: Insufficient documentation

## 2021-06-26 DIAGNOSIS — Z79899 Other long term (current) drug therapy: Secondary | ICD-10-CM | POA: Insufficient documentation

## 2021-06-26 DIAGNOSIS — Z7189 Other specified counseling: Secondary | ICD-10-CM

## 2021-06-26 DIAGNOSIS — C9000 Multiple myeloma not having achieved remission: Secondary | ICD-10-CM

## 2021-06-26 DIAGNOSIS — Z95828 Presence of other vascular implants and grafts: Secondary | ICD-10-CM

## 2021-06-26 DIAGNOSIS — Z5112 Encounter for antineoplastic immunotherapy: Secondary | ICD-10-CM | POA: Insufficient documentation

## 2021-06-26 DIAGNOSIS — Z298 Encounter for other specified prophylactic measures: Secondary | ICD-10-CM | POA: Insufficient documentation

## 2021-06-26 DIAGNOSIS — Z9225 Personal history of immunosupression therapy: Secondary | ICD-10-CM | POA: Insufficient documentation

## 2021-06-26 LAB — CBC WITH DIFFERENTIAL/PLATELET
Abs Immature Granulocytes: 0 10*3/uL (ref 0.00–0.07)
Basophils Absolute: 0.1 10*3/uL (ref 0.0–0.1)
Basophils Relative: 1 %
Eosinophils Absolute: 0.1 10*3/uL (ref 0.0–0.5)
Eosinophils Relative: 3 %
HCT: 35.5 % — ABNORMAL LOW (ref 39.0–52.0)
Hemoglobin: 12 g/dL — ABNORMAL LOW (ref 13.0–17.0)
Immature Granulocytes: 0 %
Lymphocytes Relative: 30 %
Lymphs Abs: 1.1 10*3/uL (ref 0.7–4.0)
MCH: 35.7 pg — ABNORMAL HIGH (ref 26.0–34.0)
MCHC: 33.8 g/dL (ref 30.0–36.0)
MCV: 105.7 fL — ABNORMAL HIGH (ref 80.0–100.0)
Monocytes Absolute: 0.4 10*3/uL (ref 0.1–1.0)
Monocytes Relative: 9 %
Neutro Abs: 2.2 10*3/uL (ref 1.7–7.7)
Neutrophils Relative %: 57 %
Platelets: 183 10*3/uL (ref 150–400)
RBC: 3.36 MIL/uL — ABNORMAL LOW (ref 4.22–5.81)
RDW: 13.3 % (ref 11.5–15.5)
WBC: 3.8 10*3/uL — ABNORMAL LOW (ref 4.0–10.5)
nRBC: 0 % (ref 0.0–0.2)

## 2021-06-26 LAB — CMP (CANCER CENTER ONLY)
ALT: 16 U/L (ref 0–44)
AST: 24 U/L (ref 15–41)
Albumin: 3.9 g/dL (ref 3.5–5.0)
Alkaline Phosphatase: 63 U/L (ref 38–126)
Anion gap: 11 (ref 5–15)
BUN: 15 mg/dL (ref 8–23)
CO2: 23 mmol/L (ref 22–32)
Calcium: 9.5 mg/dL (ref 8.9–10.3)
Chloride: 107 mmol/L (ref 98–111)
Creatinine: 1.45 mg/dL — ABNORMAL HIGH (ref 0.61–1.24)
GFR, Estimated: 51 mL/min — ABNORMAL LOW (ref >60)
Glucose, Bld: 139 mg/dL — ABNORMAL HIGH (ref 70–99)
Potassium: 4.2 mmol/L (ref 3.5–5.1)
Sodium: 141 mmol/L (ref 135–145)
Total Bilirubin: 0.7 mg/dL (ref 0.3–1.2)
Total Protein: 6 g/dL — ABNORMAL LOW (ref 6.5–8.1)

## 2021-06-26 MED ORDER — HEPARIN SOD (PORK) LOCK FLUSH 100 UNIT/ML IV SOLN
500.0000 [IU] | Freq: Once | INTRAVENOUS | Status: AC | PRN
  Administered 2021-06-26: 500 [IU]

## 2021-06-26 MED ORDER — CILGAVIMAB (PART OF EVUSHELD) INJECTION
300.0000 mg | Freq: Once | INTRAMUSCULAR | Status: AC
  Administered 2021-06-26: 300 mg via INTRAMUSCULAR
  Filled 2021-06-26: qty 3

## 2021-06-26 MED ORDER — DEXTROSE 5 % IV SOLN
56.0000 mg/m2 | Freq: Once | INTRAVENOUS | Status: AC
  Administered 2021-06-26: 120 mg via INTRAVENOUS
  Filled 2021-06-26: qty 60

## 2021-06-26 MED ORDER — SODIUM CHLORIDE 0.9% FLUSH
10.0000 mL | Freq: Once | INTRAVENOUS | Status: AC
  Administered 2021-06-26: 10 mL

## 2021-06-26 MED ORDER — PROCHLORPERAZINE MALEATE 10 MG PO TABS
10.0000 mg | ORAL_TABLET | Freq: Once | ORAL | Status: AC
  Administered 2021-06-26: 10 mg via ORAL
  Filled 2021-06-26: qty 1

## 2021-06-26 MED ORDER — TIXAGEVIMAB (PART OF EVUSHELD) INJECTION
300.0000 mg | Freq: Once | INTRAMUSCULAR | Status: AC
  Administered 2021-06-26: 300 mg via INTRAMUSCULAR
  Filled 2021-06-26: qty 3

## 2021-06-26 MED ORDER — SODIUM CHLORIDE 0.9% FLUSH
10.0000 mL | INTRAVENOUS | Status: DC | PRN
  Administered 2021-06-26: 10 mL

## 2021-06-26 MED ORDER — SODIUM CHLORIDE 0.9 % IV SOLN
10.0000 mg | Freq: Once | INTRAVENOUS | Status: AC
  Administered 2021-06-26: 10 mg via INTRAVENOUS
  Filled 2021-06-26: qty 10

## 2021-06-26 MED ORDER — ACETAMINOPHEN 500 MG PO TABS
1000.0000 mg | ORAL_TABLET | Freq: Once | ORAL | Status: AC
  Administered 2021-06-26: 1000 mg via ORAL
  Filled 2021-06-26: qty 2

## 2021-06-26 MED ORDER — SODIUM CHLORIDE 0.9 % IV SOLN
Freq: Once | INTRAVENOUS | Status: AC
Start: 2021-06-26 — End: 2021-06-26

## 2021-06-26 MED ORDER — SODIUM CHLORIDE 0.9 % IV SOLN: Freq: Once | INTRAVENOUS | Status: AC

## 2021-06-29 ENCOUNTER — Encounter: Payer: Medicare Other | Admitting: Internal Medicine

## 2021-06-29 ENCOUNTER — Ambulatory Visit (INDEPENDENT_AMBULATORY_CARE_PROVIDER_SITE_OTHER): Payer: Medicare Other

## 2021-06-29 ENCOUNTER — Other Ambulatory Visit: Payer: Self-pay

## 2021-06-29 DIAGNOSIS — Z0000 Encounter for general adult medical examination without abnormal findings: Secondary | ICD-10-CM

## 2021-06-29 LAB — MULTIPLE MYELOMA PANEL, SERUM
Albumin SerPl Elph-Mcnc: 3.7 g/dL (ref 2.9–4.4)
Albumin/Glob SerPl: 2 — ABNORMAL HIGH (ref 0.7–1.7)
Alpha 1: 0.2 g/dL (ref 0.0–0.4)
Alpha2 Glob SerPl Elph-Mcnc: 0.7 g/dL (ref 0.4–1.0)
B-Globulin SerPl Elph-Mcnc: 0.9 g/dL (ref 0.7–1.3)
Gamma Glob SerPl Elph-Mcnc: 0.2 g/dL — ABNORMAL LOW (ref 0.4–1.8)
Globulin, Total: 1.9 g/dL — ABNORMAL LOW (ref 2.2–3.9)
IgA: 16 mg/dL — ABNORMAL LOW (ref 61–437)
IgG (Immunoglobin G), Serum: 211 mg/dL — ABNORMAL LOW (ref 603–1613)
IgM (Immunoglobulin M), Srm: 12 mg/dL — ABNORMAL LOW (ref 15–143)
Total Protein ELP: 5.6 g/dL — ABNORMAL LOW (ref 6.0–8.5)
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
## 2021-07-02 NOTE — Progress Notes (Signed)
IElby Showers, MD, have reviewed all documentation for this visit. The documentation on 07/02/21 for the exam, diagnosis, procedures, and orders are all accurate and complete.

## 2021-07-09 ENCOUNTER — Other Ambulatory Visit: Payer: Self-pay

## 2021-07-09 DIAGNOSIS — C9001 Multiple myeloma in remission: Secondary | ICD-10-CM

## 2021-07-10 ENCOUNTER — Inpatient Hospital Stay: Payer: Medicare Other

## 2021-07-10 ENCOUNTER — Other Ambulatory Visit: Payer: Self-pay | Admitting: Internal Medicine

## 2021-07-10 ENCOUNTER — Other Ambulatory Visit: Payer: Self-pay

## 2021-07-10 VITALS — BP 112/88 | HR 81 | Temp 98.4°F | Resp 18 | Wt 192.5 lb

## 2021-07-10 DIAGNOSIS — C9000 Multiple myeloma not having achieved remission: Secondary | ICD-10-CM

## 2021-07-10 DIAGNOSIS — C9001 Multiple myeloma in remission: Secondary | ICD-10-CM

## 2021-07-10 DIAGNOSIS — Z5112 Encounter for antineoplastic immunotherapy: Secondary | ICD-10-CM | POA: Diagnosis not present

## 2021-07-10 DIAGNOSIS — Z79899 Other long term (current) drug therapy: Secondary | ICD-10-CM | POA: Diagnosis not present

## 2021-07-10 DIAGNOSIS — Z7189 Other specified counseling: Secondary | ICD-10-CM

## 2021-07-10 DIAGNOSIS — Z9225 Personal history of immunosupression therapy: Secondary | ICD-10-CM | POA: Diagnosis not present

## 2021-07-10 DIAGNOSIS — Z95828 Presence of other vascular implants and grafts: Secondary | ICD-10-CM

## 2021-07-10 DIAGNOSIS — Z298 Encounter for other specified prophylactic measures: Secondary | ICD-10-CM | POA: Diagnosis not present

## 2021-07-10 LAB — CBC WITH DIFFERENTIAL (CANCER CENTER ONLY)
Abs Immature Granulocytes: 0.01 10*3/uL (ref 0.00–0.07)
Basophils Absolute: 0 10*3/uL (ref 0.0–0.1)
Basophils Relative: 1 %
Eosinophils Absolute: 0.1 10*3/uL (ref 0.0–0.5)
Eosinophils Relative: 3 %
HCT: 36.5 % — ABNORMAL LOW (ref 39.0–52.0)
Hemoglobin: 12.2 g/dL — ABNORMAL LOW (ref 13.0–17.0)
Immature Granulocytes: 0 %
Lymphocytes Relative: 22 %
Lymphs Abs: 1.1 10*3/uL (ref 0.7–4.0)
MCH: 35.2 pg — ABNORMAL HIGH (ref 26.0–34.0)
MCHC: 33.4 g/dL (ref 30.0–36.0)
MCV: 105.2 fL — ABNORMAL HIGH (ref 80.0–100.0)
Monocytes Absolute: 0.4 10*3/uL (ref 0.1–1.0)
Monocytes Relative: 8 %
Neutro Abs: 3.2 10*3/uL (ref 1.7–7.7)
Neutrophils Relative %: 66 %
Platelet Count: 162 10*3/uL (ref 150–400)
RBC: 3.47 MIL/uL — ABNORMAL LOW (ref 4.22–5.81)
RDW: 13.4 % (ref 11.5–15.5)
WBC Count: 4.9 10*3/uL (ref 4.0–10.5)
nRBC: 0 % (ref 0.0–0.2)

## 2021-07-10 LAB — CMP (CANCER CENTER ONLY)
ALT: 14 U/L (ref 0–44)
AST: 21 U/L (ref 15–41)
Albumin: 4.2 g/dL (ref 3.5–5.0)
Alkaline Phosphatase: 66 U/L (ref 38–126)
Anion gap: 6 (ref 5–15)
BUN: 18 mg/dL (ref 8–23)
CO2: 28 mmol/L (ref 22–32)
Calcium: 9.6 mg/dL (ref 8.9–10.3)
Chloride: 106 mmol/L (ref 98–111)
Creatinine: 1.36 mg/dL — ABNORMAL HIGH (ref 0.61–1.24)
GFR, Estimated: 55 mL/min — ABNORMAL LOW (ref >60)
Glucose, Bld: 107 mg/dL — ABNORMAL HIGH (ref 70–99)
Potassium: 4.4 mmol/L (ref 3.5–5.1)
Sodium: 140 mmol/L (ref 135–145)
Total Bilirubin: 0.6 mg/dL (ref 0.3–1.2)
Total Protein: 6.1 g/dL — ABNORMAL LOW (ref 6.5–8.1)

## 2021-07-10 MED ORDER — SODIUM CHLORIDE 0.9% FLUSH
10.0000 mL | Freq: Once | INTRAVENOUS | Status: AC
Start: 2021-07-10 — End: 2021-07-10
  Administered 2021-07-10: 10:00:00 10 mL

## 2021-07-10 MED ORDER — SODIUM CHLORIDE 0.9 % IV SOLN
60.0000 mg | Freq: Once | INTRAVENOUS | Status: AC
  Administered 2021-07-10: 13:00:00 60 mg via INTRAVENOUS
  Filled 2021-07-10: qty 10

## 2021-07-10 MED ORDER — SODIUM CHLORIDE 0.9% FLUSH
10.0000 mL | INTRAVENOUS | Status: DC | PRN
  Administered 2021-07-10: 17:00:00 10 mL

## 2021-07-10 MED ORDER — HEPARIN SOD (PORK) LOCK FLUSH 100 UNIT/ML IV SOLN
500.0000 [IU] | Freq: Once | INTRAVENOUS | Status: AC | PRN
  Administered 2021-07-10: 17:00:00 500 [IU]

## 2021-07-10 MED ORDER — ACETAMINOPHEN 500 MG PO TABS
1000.0000 mg | ORAL_TABLET | Freq: Once | ORAL | Status: AC
  Administered 2021-07-10: 11:00:00 1000 mg via ORAL
  Filled 2021-07-10: qty 2

## 2021-07-10 MED ORDER — PROCHLORPERAZINE MALEATE 10 MG PO TABS
10.0000 mg | ORAL_TABLET | Freq: Once | ORAL | Status: AC
  Administered 2021-07-10: 11:00:00 10 mg via ORAL
  Filled 2021-07-10: qty 1

## 2021-07-10 MED ORDER — SODIUM CHLORIDE 0.9 % IV SOLN: Freq: Once | INTRAVENOUS | Status: AC

## 2021-07-10 MED ORDER — DEXTROSE 5 % IV SOLN
56.0000 mg/m2 | Freq: Once | INTRAVENOUS | Status: AC
  Administered 2021-07-10: 12:00:00 120 mg via INTRAVENOUS
  Filled 2021-07-10: qty 60

## 2021-07-10 MED ORDER — SODIUM CHLORIDE 0.9 % IV SOLN
10.0000 mg | Freq: Once | INTRAVENOUS | Status: AC
  Administered 2021-07-10: 11:00:00 10 mg via INTRAVENOUS
  Filled 2021-07-10: qty 10

## 2021-07-10 NOTE — Telephone Encounter (Signed)
Only visit in 2022 was covid video visit. Did CPE labs cancelled CPE in 2022.

## 2021-07-10 NOTE — Patient Instructions (Signed)
[facility]   Discharge Instructions: Thank you for choosing [facility] to provide your oncology and hematology care.   If you have a lab appointment with the [facility], please go directly to the [facility] and check in at the registration area.   Wear comfortable clothing and clothing appropriate for easy access to any Portacath or PICC line.   We strive to give you quality time with your provider. You may need to reschedule your appointment if you arrive late (15 or more minutes).  Arriving late affects you and other patients whose appointments are after yours.  Also, if you miss three or more appointments without notifying the office, you may be dismissed from the clinic at the providers discretion.      For prescription refill requests, have your pharmacy contact our office and allow 72 hours for refills to be completed.    Today you received the following chemotherapy and/or immunotherapy agents: carfilzomib.      To help prevent nausea and vomiting after your treatment, we encourage you to take your nausea medication as directed.  BELOW ARE SYMPTOMS THAT SHOULD BE REPORTED IMMEDIATELY: *FEVER GREATER THAN 100.4 F (38 C) OR HIGHER *CHILLS OR SWEATING *NAUSEA AND VOMITING THAT IS NOT CONTROLLED WITH YOUR NAUSEA MEDICATION *UNUSUAL SHORTNESS OF BREATH *UNUSUAL BRUISING OR BLEEDING *URINARY PROBLEMS (pain or burning when urinating, or frequent urination) *BOWEL PROBLEMS (unusual diarrhea, constipation, pain near the anus) TENDERNESS IN MOUTH AND THROAT WITH OR WITHOUT PRESENCE OF ULCERS (sore throat, sores in mouth, or a toothache) UNUSUAL RASH, SWELLING OR PAIN  UNUSUAL VAGINAL DISCHARGE OR ITCHING   Items with * indicate a potential emergency and should be followed up as soon as possible or go to the Emergency Department if any problems should occur.  Please show the CHEMOTHERAPY ALERT CARD or IMMUNOTHERAPY ALERT CARD at  check-in to the Emergency Department and triage nurse.  Should you have questions after your visit or need to cancel or reschedule your appointment, please contact [facility]  Dept: [phone]  and follow the prompts.  Office hours are 8:00 a.m. to 4:30 p.m. Monday - Friday. Please note that voicemails left after 4:00 p.m. may not be returned until the following business day.  We are closed weekends and major holidays. You have access to a nurse at all times for urgent questions. Please call the main number to the clinic Dept: [phone] and follow the prompts.   For any non-urgent questions, you may also contact your provider using MyChart. We now offer e-Visits for anyone 74 and older to request care online for non-urgent symptoms. For details visit [email or web address].   Also download the MyChart app! Go to the app store, search "MyChart", open the app, select [facility], and log in with your MyChart username and password.  Due to Covid, a mask is required upon entering the hospital/clinic. If you do not have a mask, one will be given to you upon arrival. For doctor visits, patients may have 1 support person aged 66 or older with them. For treatment visits, patients cannot have anyone with them due to current Covid guidelines and our immunocompromised population.   Pamidronate Injection What is this medication? PAMIDRONATE (pa mi DROE nate) slows calcium loss from bones. It treats Paget's disease and high calcium levels in the blood from some kinds of cancer. It may be used in other people at risk for bone loss. This medicine may be  used for other purposes; ask your health care provider or pharmacist if you have questions. COMMON BRAND NAME(S): Aredia What should I tell my care team before I take this medication? They need to know if you have any of these conditions: bleeding disorder cancer dental disease kidney disease low levels of calcium or other  minerals in the blood low red blood cell counts receiving steroids like dexamethasone or prednisone an unusual or allergic reaction to pamidronate, other drugs, foods, dyes or preservatives pregnant or trying to get pregnant breast-feeding How should I use this medication? This drug is injected into a vein. It is given by a health care provider in a hospital or clinic setting. Talk to your health care provider about the use of this drug in children. Special care may be needed. Overdosage: If you think you have taken too much of this medicine contact a poison control center or emergency room at once. NOTE: This medicine is only for you. Do not share this medicine with others. What if I miss a dose? Keep appointments for follow-up doses. It is important not to miss your dose. Call your health care provider if you are unable to keep an appointment. What may interact with this medication? certain antibiotics given by injection medicines for inflammation or pain like ibuprofen, naproxen some diuretics like bumetanide, furosemide cyclosporine parathyroid hormone tacrolimus teriparatide thalidomide This list may not describe all possible interactions. Give your health care provider a list of all the medicines, herbs, non-prescription drugs, or dietary supplements you use. Also tell them if you smoke, drink alcohol, or use illegal drugs. Some items may interact with your medicine. What should I watch for while using this medication? Visit your health care provider for regular checks on your progress. It may be some time before you see the benefit from this drug. Some people who take this drug have severe bone, joint, or muscle pain. This drug may also increase your risk for jaw problems or a broken thigh bone. Tell your health care provider right away if you have severe pain in your jaw, bones, joints, or muscles. Tell you health care provider if you have any pain that does not go away or that gets  worse. Tell your dentist and dental surgeon that you are taking this drug. You should not have major dental surgery while on this drug. See your dentist to have a dental exam and fix any dental problems before starting this drug. Take good care of your teeth while on this drug. Make sure you see your dentist for regular follow-up appointments. You should make sure you get enough calcium and vitamin D while you are taking this drug. Discuss the foods you eat and the vitamins you take with your health care provider. You may need blood work done while you are taking this drug. Do not become pregnant while taking this drug. Women should inform their health care provider if they wish to become pregnant or think they might be pregnant. There is potential for serious harm to an unborn child. Talk to your health care provider for more information. What side effects may I notice from receiving this medication? Side effects that you should report to your doctor or health care provider as soon as possible: allergic reactions (skin rash, itching or hives; swelling of the face, lips, or tongue) bleeding (bloody or black, tarry stools; red or dark brown urine; spitting up blood or brown material that looks like coffee grounds; red spots on the skin; unusual  bruising or bleeding from the eyes, gums, or nose) bone pain increased thirst infection (fever, chills, cough, sore throat, pain or trouble passing urine) jaw pain, especially after dental work joint pain kidney injury (trouble passing urine or change in the amount of urine) low calcium levels (fast heartbeat; muscle cramps or pain; pain, tingling, or numbness in the hands or feet; seizures) low magnesium levels (fast, irregular heartbeat; muscle cramp or pain; muscle weakness; tremors; seizures) low potassium levels (trouble breathing; chest pain; dizziness; fast, irregular heartbeat; feeling faint or lightheaded, falls; muscle cramps or pain) muscle  pain pain, redness, or irritation at site where injected redness, blistering, peeling, or loosening of the skin, including inside the mouth severe diarrhea unusual sweating Side effects that usually do not require medical attention (report to your doctor or health care provider if they continue or are bothersome): constipation eye irritation, itching, or pain fever headache increase in blood pressure loss of appetite nausea stomach pain unusually weak or tired vomiting This list may not describe all possible side effects. Call your doctor for medical advice about side effects. You may report side effects to FDA at 1-800-FDA-1088. Where should I keep my medication? This drug is given in a hospital or clinic. It will not be stored at home. NOTE: This sheet is a summary. It may not cover all possible information. If you have questions about this medicine, talk to your doctor, pharmacist, or health care provider.  2022 Elsevier/Gold Standard (2021-03-27 00:00:00)

## 2021-07-18 ENCOUNTER — Encounter: Payer: Self-pay | Admitting: Hematology

## 2021-07-19 ENCOUNTER — Other Ambulatory Visit: Payer: Self-pay

## 2021-07-19 DIAGNOSIS — C9000 Multiple myeloma not having achieved remission: Secondary | ICD-10-CM

## 2021-07-20 ENCOUNTER — Other Ambulatory Visit: Payer: Self-pay

## 2021-07-20 ENCOUNTER — Encounter: Payer: Self-pay | Admitting: Hematology

## 2021-07-20 DIAGNOSIS — C9000 Multiple myeloma not having achieved remission: Secondary | ICD-10-CM

## 2021-07-20 MED ORDER — TRAMADOL HCL 50 MG PO TABS: 50.0000 mg | ORAL_TABLET | Freq: Four times a day (QID) | ORAL | 0 refills | Status: DC | PRN

## 2021-07-20 MED ORDER — TRAZODONE HCL 50 MG PO TABS: 50.0000 mg | ORAL_TABLET | Freq: Every evening | ORAL | 0 refills | Status: DC | PRN

## 2021-07-24 ENCOUNTER — Other Ambulatory Visit: Payer: Self-pay

## 2021-07-24 ENCOUNTER — Inpatient Hospital Stay: Payer: Medicare Other

## 2021-07-24 ENCOUNTER — Ambulatory Visit: Payer: Medicare Other

## 2021-07-24 ENCOUNTER — Inpatient Hospital Stay: Payer: Medicare Other | Attending: Hematology

## 2021-07-24 VITALS — BP 131/87 | HR 77 | Temp 98.2°F | Resp 18 | Wt 195.0 lb

## 2021-07-24 DIAGNOSIS — Z95828 Presence of other vascular implants and grafts: Secondary | ICD-10-CM

## 2021-07-24 DIAGNOSIS — Z79899 Other long term (current) drug therapy: Secondary | ICD-10-CM | POA: Diagnosis not present

## 2021-07-24 DIAGNOSIS — Z5112 Encounter for antineoplastic immunotherapy: Secondary | ICD-10-CM | POA: Insufficient documentation

## 2021-07-24 DIAGNOSIS — C9000 Multiple myeloma not having achieved remission: Secondary | ICD-10-CM

## 2021-07-24 DIAGNOSIS — Z7189 Other specified counseling: Secondary | ICD-10-CM

## 2021-07-24 LAB — CBC WITH DIFFERENTIAL (CANCER CENTER ONLY)
Abs Immature Granulocytes: 0.01 10*3/uL (ref 0.00–0.07)
Basophils Absolute: 0 10*3/uL (ref 0.0–0.1)
Basophils Relative: 1 %
Eosinophils Absolute: 0.2 10*3/uL (ref 0.0–0.5)
Eosinophils Relative: 4 %
HCT: 34.7 % — ABNORMAL LOW (ref 39.0–52.0)
Hemoglobin: 11.6 g/dL — ABNORMAL LOW (ref 13.0–17.0)
Immature Granulocytes: 0 %
Lymphocytes Relative: 27 %
Lymphs Abs: 1.1 10*3/uL (ref 0.7–4.0)
MCH: 35 pg — ABNORMAL HIGH (ref 26.0–34.0)
MCHC: 33.4 g/dL (ref 30.0–36.0)
MCV: 104.8 fL — ABNORMAL HIGH (ref 80.0–100.0)
Monocytes Absolute: 0.4 10*3/uL (ref 0.1–1.0)
Monocytes Relative: 10 %
Neutro Abs: 2.5 10*3/uL (ref 1.7–7.7)
Neutrophils Relative %: 58 %
Platelet Count: 168 10*3/uL (ref 150–400)
RBC: 3.31 MIL/uL — ABNORMAL LOW (ref 4.22–5.81)
RDW: 13.3 % (ref 11.5–15.5)
WBC Count: 4.3 10*3/uL (ref 4.0–10.5)
nRBC: 0 % (ref 0.0–0.2)

## 2021-07-24 LAB — CMP (CANCER CENTER ONLY)
ALT: 14 U/L (ref 0–44)
AST: 20 U/L (ref 15–41)
Albumin: 3.9 g/dL (ref 3.5–5.0)
Alkaline Phosphatase: 66 U/L (ref 38–126)
Anion gap: 5 (ref 5–15)
BUN: 17 mg/dL (ref 8–23)
CO2: 27 mmol/L (ref 22–32)
Calcium: 9.4 mg/dL (ref 8.9–10.3)
Chloride: 108 mmol/L (ref 98–111)
Creatinine: 1.35 mg/dL — ABNORMAL HIGH (ref 0.61–1.24)
GFR, Estimated: 55 mL/min — ABNORMAL LOW (ref >60)
Glucose, Bld: 96 mg/dL (ref 70–99)
Potassium: 4 mmol/L (ref 3.5–5.1)
Sodium: 140 mmol/L (ref 135–145)
Total Bilirubin: 0.7 mg/dL (ref 0.3–1.2)
Total Protein: 5.9 g/dL — ABNORMAL LOW (ref 6.5–8.1)

## 2021-07-24 MED ORDER — DEXTROSE 5 % IV SOLN
56.0000 mg/m2 | Freq: Once | INTRAVENOUS | Status: AC
  Administered 2021-07-24: 120 mg via INTRAVENOUS
  Filled 2021-07-24: qty 60

## 2021-07-24 MED ORDER — SODIUM CHLORIDE 0.9% FLUSH
10.0000 mL | INTRAVENOUS | Status: DC | PRN
  Administered 2021-07-24: 10 mL

## 2021-07-24 MED ORDER — PROCHLORPERAZINE MALEATE 10 MG PO TABS
10.0000 mg | ORAL_TABLET | Freq: Once | ORAL | Status: AC
  Administered 2021-07-24: 10 mg via ORAL
  Filled 2021-07-24: qty 1

## 2021-07-24 MED ORDER — SODIUM CHLORIDE 0.9 % IV SOLN: Freq: Once | INTRAVENOUS | Status: AC

## 2021-07-24 MED ORDER — HEPARIN SOD (PORK) LOCK FLUSH 100 UNIT/ML IV SOLN
500.0000 [IU] | Freq: Once | INTRAVENOUS | Status: AC | PRN
  Administered 2021-07-24: 500 [IU]

## 2021-07-24 MED ORDER — SODIUM CHLORIDE 0.9% FLUSH
10.0000 mL | Freq: Once | INTRAVENOUS | Status: AC
  Administered 2021-07-24: 10 mL

## 2021-07-24 MED ORDER — SODIUM CHLORIDE 0.9 % IV SOLN
10.0000 mg | Freq: Once | INTRAVENOUS | Status: AC
  Administered 2021-07-24: 10 mg via INTRAVENOUS
  Filled 2021-07-24: qty 10

## 2021-07-24 MED ORDER — ACETAMINOPHEN 500 MG PO TABS
1000.0000 mg | ORAL_TABLET | Freq: Once | ORAL | Status: AC
  Administered 2021-07-24: 1000 mg via ORAL
  Filled 2021-07-24: qty 2

## 2021-07-24 NOTE — Patient Instructions (Signed)
[facility]  Discharge Instructions: Thank you for choosing [facility] to provide your oncology and hematology care.   If you have a lab appointment with the Cancer Center, please go directly to the Cancer Center and check in at the registration area.   Wear comfortable clothing and clothing appropriate for easy access to any Portacath or PICC line.   We strive to give you quality time with your provider. You may need to reschedule your appointment if you arrive late (15 or more minutes).  Arriving late affects you and other patients whose appointments are after yours.  Also, if you miss three or more appointments without notifying the office, you may be dismissed from the clinic at the provider's discretion.      For prescription refill requests, have your pharmacy contact our office and allow 72 hours for refills to be completed.    Today you received the following chemotherapy and/or immunotherapy agents: Carfilzomib.      To help prevent nausea and vomiting after your treatment, we encourage you to take your nausea medication as directed.  BELOW ARE SYMPTOMS THAT SHOULD BE REPORTED IMMEDIATELY: *FEVER GREATER THAN 100.4 F (38 C) OR HIGHER *CHILLS OR SWEATING *NAUSEA AND VOMITING THAT IS NOT CONTROLLED WITH YOUR NAUSEA MEDICATION *UNUSUAL SHORTNESS OF BREATH *UNUSUAL BRUISING OR BLEEDING *URINARY PROBLEMS (pain or burning when urinating, or frequent urination) *BOWEL PROBLEMS (unusual diarrhea, constipation, pain near the anus) TENDERNESS IN MOUTH AND THROAT WITH OR WITHOUT PRESENCE OF ULCERS (sore throat, sores in mouth, or a toothache) UNUSUAL RASH, SWELLING OR PAIN  UNUSUAL VAGINAL DISCHARGE OR ITCHING   Items with * indicate a potential emergency and should be followed up as soon as possible or go to the Emergency Department if any problems should occur.  Please show the CHEMOTHERAPY ALERT CARD or IMMUNOTHERAPY ALERT CARD at check-in  to the Emergency Department and triage nurse.  Should you have questions after your visit or need to cancel or reschedule your appointment, please contact [facility]  Dept: [phone]  and follow the prompts.  Office hours are 8:00 a.m. to 4:30 p.m. Monday - Friday. Please note that voicemails left after 4:00 p.m. may not be returned until the following business day.  We are closed weekends and major holidays. You have access to a nurse at all times for urgent questions. Please call the main number to the clinic Dept: [phone] and follow the prompts.   For any non-urgent questions, you may also contact your provider using MyChart. We now offer e-Visits for anyone 18 and older to request care online for non-urgent symptoms. For details visit [email or web address].   Also download the MyChart app! Go to the app store, search "MyChart", open the app, select [place], and log in with your MyChart username and password.  Due to Covid, a mask is required upon entering the hospital/clinic. If you do not have a mask, one will be given to you upon arrival. For doctor visits, patients may have 1 support person aged 18 or older with them. For treatment visits, patients cannot have anyone with them due to current Covid guidelines and our immunocompromised population.   

## 2021-07-30 LAB — MULTIPLE MYELOMA PANEL, SERUM
Albumin SerPl Elph-Mcnc: 3.8 g/dL (ref 2.9–4.4)
Albumin/Glob SerPl: 2.3 — ABNORMAL HIGH (ref 0.7–1.7)
Alpha 1: 0.2 g/dL (ref 0.0–0.4)
Alpha2 Glob SerPl Elph-Mcnc: 0.6 g/dL (ref 0.4–1.0)
B-Globulin SerPl Elph-Mcnc: 0.8 g/dL (ref 0.7–1.3)
Gamma Glob SerPl Elph-Mcnc: 0.1 g/dL — ABNORMAL LOW (ref 0.4–1.8)
Globulin, Total: 1.7 g/dL — ABNORMAL LOW (ref 2.2–3.9)
IgA: 15 mg/dL — ABNORMAL LOW (ref 61–437)
IgG (Immunoglobin G), Serum: 211 mg/dL — ABNORMAL LOW (ref 603–1613)
IgM (Immunoglobulin M), Srm: 9 mg/dL — ABNORMAL LOW (ref 15–143)
Total Protein ELP: 5.5 g/dL — ABNORMAL LOW (ref 6.0–8.5)

## 2021-08-06 ENCOUNTER — Other Ambulatory Visit: Payer: Self-pay

## 2021-08-06 DIAGNOSIS — C9000 Multiple myeloma not having achieved remission: Secondary | ICD-10-CM

## 2021-08-07 ENCOUNTER — Inpatient Hospital Stay: Payer: Medicare Other | Admitting: Hematology

## 2021-08-07 ENCOUNTER — Inpatient Hospital Stay: Payer: Medicare Other

## 2021-08-07 ENCOUNTER — Ambulatory Visit: Payer: Medicare Other

## 2021-08-07 ENCOUNTER — Other Ambulatory Visit: Payer: Self-pay

## 2021-08-07 VITALS — BP 128/79 | HR 63 | Temp 97.3°F | Resp 17 | Wt 196.5 lb

## 2021-08-07 DIAGNOSIS — Z95828 Presence of other vascular implants and grafts: Secondary | ICD-10-CM

## 2021-08-07 DIAGNOSIS — Z7189 Other specified counseling: Secondary | ICD-10-CM

## 2021-08-07 DIAGNOSIS — Z5111 Encounter for antineoplastic chemotherapy: Secondary | ICD-10-CM

## 2021-08-07 DIAGNOSIS — C9001 Multiple myeloma in remission: Secondary | ICD-10-CM | POA: Diagnosis not present

## 2021-08-07 DIAGNOSIS — C9000 Multiple myeloma not having achieved remission: Secondary | ICD-10-CM

## 2021-08-07 DIAGNOSIS — Z5112 Encounter for antineoplastic immunotherapy: Secondary | ICD-10-CM | POA: Diagnosis not present

## 2021-08-07 LAB — CBC WITH DIFFERENTIAL (CANCER CENTER ONLY)
Abs Immature Granulocytes: 0 10*3/uL (ref 0.00–0.07)
Basophils Absolute: 0 10*3/uL (ref 0.0–0.1)
Basophils Relative: 1 %
Eosinophils Absolute: 0.3 10*3/uL (ref 0.0–0.5)
Eosinophils Relative: 6 %
HCT: 33.7 % — ABNORMAL LOW (ref 39.0–52.0)
Hemoglobin: 11.7 g/dL — ABNORMAL LOW (ref 13.0–17.0)
Immature Granulocytes: 0 %
Lymphocytes Relative: 24 %
Lymphs Abs: 1.1 10*3/uL (ref 0.7–4.0)
MCH: 36.1 pg — ABNORMAL HIGH (ref 26.0–34.0)
MCHC: 34.7 g/dL (ref 30.0–36.0)
MCV: 104 fL — ABNORMAL HIGH (ref 80.0–100.0)
Monocytes Absolute: 0.5 10*3/uL (ref 0.1–1.0)
Monocytes Relative: 11 %
Neutro Abs: 2.8 10*3/uL (ref 1.7–7.7)
Neutrophils Relative %: 58 %
Platelet Count: 189 10*3/uL (ref 150–400)
RBC: 3.24 MIL/uL — ABNORMAL LOW (ref 4.22–5.81)
RDW: 13.3 % (ref 11.5–15.5)
WBC Count: 4.7 10*3/uL (ref 4.0–10.5)
nRBC: 0 % (ref 0.0–0.2)

## 2021-08-07 LAB — CMP (CANCER CENTER ONLY)
ALT: 13 U/L (ref 0–44)
AST: 19 U/L (ref 15–41)
Albumin: 4 g/dL (ref 3.5–5.0)
Alkaline Phosphatase: 65 U/L (ref 38–126)
Anion gap: 7 (ref 5–15)
BUN: 13 mg/dL (ref 8–23)
CO2: 26 mmol/L (ref 22–32)
Calcium: 9.3 mg/dL (ref 8.9–10.3)
Chloride: 106 mmol/L (ref 98–111)
Creatinine: 1.28 mg/dL — ABNORMAL HIGH (ref 0.61–1.24)
GFR, Estimated: 59 mL/min — ABNORMAL LOW (ref >60)
Glucose, Bld: 86 mg/dL (ref 70–99)
Potassium: 4.3 mmol/L (ref 3.5–5.1)
Sodium: 139 mmol/L (ref 135–145)
Total Bilirubin: 0.6 mg/dL (ref 0.3–1.2)
Total Protein: 6 g/dL — ABNORMAL LOW (ref 6.5–8.1)

## 2021-08-07 MED ORDER — DEXTROSE 5 % IV SOLN
56.0000 mg/m2 | Freq: Once | INTRAVENOUS | Status: AC
  Administered 2021-08-07: 120 mg via INTRAVENOUS
  Filled 2021-08-07: qty 60

## 2021-08-07 MED ORDER — SODIUM CHLORIDE 0.9 % IV SOLN
10.0000 mg | Freq: Once | INTRAVENOUS | Status: AC
  Administered 2021-08-07: 10 mg via INTRAVENOUS
  Filled 2021-08-07: qty 10

## 2021-08-07 MED ORDER — HEPARIN SOD (PORK) LOCK FLUSH 100 UNIT/ML IV SOLN
500.0000 [IU] | Freq: Once | INTRAVENOUS | Status: AC | PRN
  Administered 2021-08-07: 500 [IU]

## 2021-08-07 MED ORDER — ACETAMINOPHEN 500 MG PO TABS
1000.0000 mg | ORAL_TABLET | Freq: Once | ORAL | Status: AC
  Administered 2021-08-07: 1000 mg via ORAL

## 2021-08-07 MED ORDER — SODIUM CHLORIDE 0.9 % IV SOLN: Freq: Once | INTRAVENOUS | Status: AC

## 2021-08-07 MED ORDER — SODIUM CHLORIDE 0.9% FLUSH
10.0000 mL | INTRAVENOUS | Status: DC | PRN
  Administered 2021-08-07: 10 mL

## 2021-08-07 MED ORDER — PROCHLORPERAZINE MALEATE 10 MG PO TABS
10.0000 mg | ORAL_TABLET | Freq: Once | ORAL | Status: AC
  Administered 2021-08-07: 10 mg via ORAL

## 2021-08-07 MED ORDER — SODIUM CHLORIDE 0.9% FLUSH
10.0000 mL | Freq: Once | INTRAVENOUS | Status: AC
  Administered 2021-08-07: 10 mL

## 2021-08-07 NOTE — Patient Instructions (Signed)
[facility]  Discharge Instructions: Thank you for choosing [facility] to provide your oncology and hematology care.   If you have a lab appointment with the Cancer Center, please go directly to the Cancer Center and check in at the registration area.   Wear comfortable clothing and clothing appropriate for easy access to any Portacath or PICC line.   We strive to give you quality time with your provider. You may need to reschedule your appointment if you arrive late (15 or more minutes).  Arriving late affects you and other patients whose appointments are after yours.  Also, if you miss three or more appointments without notifying the office, you may be dismissed from the clinic at the provider's discretion.      For prescription refill requests, have your pharmacy contact our office and allow 72 hours for refills to be completed.    Today you received the following chemotherapy and/or immunotherapy agents: Kyprolis    To help prevent nausea and vomiting after your treatment, we encourage you to take your nausea medication as directed.  BELOW ARE SYMPTOMS THAT SHOULD BE REPORTED IMMEDIATELY: . *FEVER GREATER THAN 100.4 F (38 C) OR HIGHER . *CHILLS OR SWEATING . *NAUSEA AND VOMITING THAT IS NOT CONTROLLED WITH YOUR NAUSEA MEDICATION . *UNUSUAL SHORTNESS OF BREATH . *UNUSUAL BRUISING OR BLEEDING . *URINARY PROBLEMS (pain or burning when urinating, or frequent urination) . *BOWEL PROBLEMS (unusual diarrhea, constipation, pain near the anus) . TENDERNESS IN MOUTH AND THROAT WITH OR WITHOUT PRESENCE OF ULCERS (sore throat, sores in mouth, or a toothache) . UNUSUAL RASH, SWELLING OR PAIN  . UNUSUAL VAGINAL DISCHARGE OR ITCHING   Items with * indicate a potential emergency and should be followed up as soon as possible or go to the Emergency Department if any problems should occur.  Please show the CHEMOTHERAPY ALERT CARD or IMMUNOTHERAPY ALERT  CARD at check-in to the Emergency Department and triage nurse.  Should you have questions after your visit or need to cancel or reschedule your appointment, please contact [facility]  Dept: [phone]  and follow the prompts.  Office hours are 8:00 a.m. to 4:30 p.m. Monday - Friday. Please note that voicemails left after 4:00 p.m. may not be returned until the following business day.  We are closed weekends and major holidays. You have access to a nurse at all times for urgent questions. Please call the main number to the clinic Dept: [phone] and follow the prompts.   For any non-urgent questions, you may also contact your provider using MyChart. We now offer e-Visits for anyone 18 and older to request care online for non-urgent symptoms. For details visit [email or web address].   Also download the MyChart app! Go to the app store, search "MyChart", open the app, select [place], and log in with your MyChart username and password.  Due to Covid, a mask is required upon entering the hospital/clinic. If you do not have a mask, one will be given to you upon arrival. For doctor visits, patients may have 1 support person aged 18 or older with them. For treatment visits, patients cannot have anyone with them due to current Covid guidelines and our immunocompromised population.   

## 2021-08-09 ENCOUNTER — Telehealth: Payer: Self-pay | Admitting: Hematology

## 2021-08-09 NOTE — Telephone Encounter (Signed)
Scheduled follow-up appointments per 1/17 los. Patient is aware. °

## 2021-08-13 ENCOUNTER — Encounter: Payer: Self-pay | Admitting: Hematology
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
## 2021-08-19 ENCOUNTER — Encounter: Payer: Self-pay | Admitting: Hematology

## 2021-08-20 ENCOUNTER — Other Ambulatory Visit: Payer: Self-pay

## 2021-08-20 DIAGNOSIS — C9001 Multiple myeloma in remission: Secondary | ICD-10-CM

## 2021-08-20 DIAGNOSIS — C9000 Multiple myeloma not having achieved remission: Secondary | ICD-10-CM

## 2021-08-20 MED FILL — Dexamethasone Sodium Phosphate Inj 100 MG/10ML: INTRAMUSCULAR | Qty: 1 | Status: AC

## 2021-08-21 ENCOUNTER — Inpatient Hospital Stay: Payer: Medicare Other

## 2021-08-21 ENCOUNTER — Encounter: Payer: Self-pay | Admitting: Hematology

## 2021-08-21 ENCOUNTER — Other Ambulatory Visit: Payer: Self-pay

## 2021-08-21 VITALS — BP 102/66 | HR 60 | Temp 98.6°F | Resp 16

## 2021-08-21 DIAGNOSIS — Z95828 Presence of other vascular implants and grafts: Secondary | ICD-10-CM

## 2021-08-21 DIAGNOSIS — Z7189 Other specified counseling: Secondary | ICD-10-CM

## 2021-08-21 DIAGNOSIS — Z5112 Encounter for antineoplastic immunotherapy: Secondary | ICD-10-CM | POA: Diagnosis not present

## 2021-08-21 DIAGNOSIS — C9001 Multiple myeloma in remission: Secondary | ICD-10-CM

## 2021-08-21 DIAGNOSIS — C9000 Multiple myeloma not having achieved remission: Secondary | ICD-10-CM

## 2021-08-21 LAB — CBC WITH DIFFERENTIAL (CANCER CENTER ONLY)
Abs Immature Granulocytes: 0.01 10*3/uL (ref 0.00–0.07)
Basophils Absolute: 0 10*3/uL (ref 0.0–0.1)
Basophils Relative: 1 %
Eosinophils Absolute: 0.3 10*3/uL (ref 0.0–0.5)
Eosinophils Relative: 5 %
HCT: 34.7 % — ABNORMAL LOW (ref 39.0–52.0)
Hemoglobin: 11.8 g/dL — ABNORMAL LOW (ref 13.0–17.0)
Immature Granulocytes: 0 %
Lymphocytes Relative: 17 %
Lymphs Abs: 0.9 10*3/uL (ref 0.7–4.0)
MCH: 35.5 pg — ABNORMAL HIGH (ref 26.0–34.0)
MCHC: 34 g/dL (ref 30.0–36.0)
MCV: 104.5 fL — ABNORMAL HIGH (ref 80.0–100.0)
Monocytes Absolute: 0.5 10*3/uL (ref 0.1–1.0)
Monocytes Relative: 9 %
Neutro Abs: 3.8 10*3/uL (ref 1.7–7.7)
Neutrophils Relative %: 68 %
Platelet Count: 181 10*3/uL (ref 150–400)
RBC: 3.32 MIL/uL — ABNORMAL LOW (ref 4.22–5.81)
RDW: 13.5 % (ref 11.5–15.5)
WBC Count: 5.5 10*3/uL (ref 4.0–10.5)
nRBC: 0 % (ref 0.0–0.2)

## 2021-08-21 LAB — CMP (CANCER CENTER ONLY)
ALT: 12 U/L (ref 0–44)
AST: 18 U/L (ref 15–41)
Albumin: 4 g/dL (ref 3.5–5.0)
Alkaline Phosphatase: 58 U/L (ref 38–126)
Anion gap: 6 (ref 5–15)
BUN: 15 mg/dL (ref 8–23)
CO2: 27 mmol/L (ref 22–32)
Calcium: 9.4 mg/dL (ref 8.9–10.3)
Chloride: 106 mmol/L (ref 98–111)
Creatinine: 1.5 mg/dL — ABNORMAL HIGH (ref 0.61–1.24)
GFR, Estimated: 49 mL/min — ABNORMAL LOW (ref >60)
Glucose, Bld: 136 mg/dL — ABNORMAL HIGH (ref 70–99)
Potassium: 4.3 mmol/L (ref 3.5–5.1)
Sodium: 139 mmol/L (ref 135–145)
Total Bilirubin: 0.6 mg/dL (ref 0.3–1.2)
Total Protein: 6 g/dL — ABNORMAL LOW (ref 6.5–8.1)

## 2021-08-21 MED ORDER — HEPARIN SOD (PORK) LOCK FLUSH 100 UNIT/ML IV SOLN
500.0000 [IU] | Freq: Once | INTRAVENOUS | Status: AC | PRN
  Administered 2021-08-21: 500 [IU]

## 2021-08-21 MED ORDER — ACETAMINOPHEN 500 MG PO TABS
1000.0000 mg | ORAL_TABLET | Freq: Once | ORAL | Status: AC
  Administered 2021-08-21: 1000 mg via ORAL
  Filled 2021-08-21: qty 2

## 2021-08-21 MED ORDER — SODIUM CHLORIDE 0.9 % IV SOLN
10.0000 mg | Freq: Once | INTRAVENOUS | Status: AC
  Administered 2021-08-21: 10 mg via INTRAVENOUS
  Filled 2021-08-21: qty 10

## 2021-08-21 MED ORDER — TRAMADOL HCL 50 MG PO TABS: 50.0000 mg | ORAL_TABLET | Freq: Four times a day (QID) | ORAL | 0 refills | Status: DC | PRN

## 2021-08-21 MED ORDER — SODIUM CHLORIDE 0.9 % IV SOLN: Freq: Once | INTRAVENOUS | Status: AC

## 2021-08-21 MED ORDER — PROCHLORPERAZINE MALEATE 10 MG PO TABS
10.0000 mg | ORAL_TABLET | Freq: Once | ORAL | Status: AC
  Administered 2021-08-21: 10 mg via ORAL
  Filled 2021-08-21: qty 1

## 2021-08-21 MED ORDER — DEXTROSE 5 % IV SOLN
56.0000 mg/m2 | Freq: Once | INTRAVENOUS | Status: AC
  Administered 2021-08-21: 120 mg via INTRAVENOUS
  Filled 2021-08-21: qty 60

## 2021-08-21 MED ORDER — SODIUM CHLORIDE 0.9% FLUSH
10.0000 mL | INTRAVENOUS | Status: DC | PRN
  Administered 2021-08-21: 10 mL

## 2021-08-21 MED ORDER — SODIUM CHLORIDE 0.9% FLUSH
10.0000 mL | Freq: Once | INTRAVENOUS | Status: AC
  Administered 2021-08-21: 10 mL

## 2021-08-21 MED ORDER — TRAZODONE HCL 50 MG PO TABS: 50.0000 mg | ORAL_TABLET | Freq: Every evening | ORAL | 0 refills | Status: DC | PRN

## 2021-08-21 NOTE — Patient Instructions (Signed)
[facility]  Discharge Instructions: Thank you for choosing [facility] to provide your oncology and hematology care.   If you have a lab appointment with the Cancer Center, please go directly to the Cancer Center and check in at the registration area.   Wear comfortable clothing and clothing appropriate for easy access to any Portacath or PICC line.   We strive to give you quality time with your provider. You may need to reschedule your appointment if you arrive late (15 or more minutes).  Arriving late affects you and other patients whose appointments are after yours.  Also, if you miss three or more appointments without notifying the office, you may be dismissed from the clinic at the provider's discretion.      For prescription refill requests, have your pharmacy contact our office and allow 72 hours for refills to be completed.    Today you received the following chemotherapy and/or immunotherapy agents carfilzomib   To help prevent nausea and vomiting after your treatment, we encourage you to take your nausea medication as directed.  BELOW ARE SYMPTOMS THAT SHOULD BE REPORTED IMMEDIATELY: . *FEVER GREATER THAN 100.4 F (38 C) OR HIGHER . *CHILLS OR SWEATING . *NAUSEA AND VOMITING THAT IS NOT CONTROLLED WITH YOUR NAUSEA MEDICATION . *UNUSUAL SHORTNESS OF BREATH . *UNUSUAL BRUISING OR BLEEDING . *URINARY PROBLEMS (pain or burning when urinating, or frequent urination) . *BOWEL PROBLEMS (unusual diarrhea, constipation, pain near the anus) . TENDERNESS IN MOUTH AND THROAT WITH OR WITHOUT PRESENCE OF ULCERS (sore throat, sores in mouth, or a toothache) . UNUSUAL RASH, SWELLING OR PAIN  . UNUSUAL VAGINAL DISCHARGE OR ITCHING   Items with * indicate a potential emergency and should be followed up as soon as possible or go to the Emergency Department if any problems should occur.  Please show the CHEMOTHERAPY ALERT CARD or IMMUNOTHERAPY ALERT  CARD at check-in to the Emergency Department and triage nurse.  Should you have questions after your visit or need to cancel or reschedule your appointment, please contact [facility]  Dept: [phone]  and follow the prompts.  Office hours are 8:00 a.m. to 4:30 p.m. Monday - Friday. Please note that voicemails left after 4:00 p.m. may not be returned until the following business day.  We are closed weekends and major holidays. You have access to a nurse at all times for urgent questions. Please call the main number to the clinic Dept: [phone] and follow the prompts.   For any non-urgent questions, you may also contact your provider using MyChart. We now offer e-Visits for anyone 18 and older to request care online for non-urgent symptoms. For details visit [email or web address].   Also download the MyChart app! Go to the app store, search "MyChart", open the app, select [place] [place], and log in with your MyChart username and password.  Due to Covid, a mask is required upon entering the hospital/clinic. If you do not have a mask, one will be given to you upon arrival. For doctor visits, patients may have 1 support person aged 18 or older with them. For treatment visits, patients cannot have anyone with them due to current Covid guidelines and our immunocompromised population.   

## 2021-08-26 ENCOUNTER — Other Ambulatory Visit: Payer: Self-pay | Admitting: Hematology

## 2021-08-26 DIAGNOSIS — Z7189 Other specified counseling: Secondary | ICD-10-CM

## 2021-08-26 DIAGNOSIS — C9000 Multiple myeloma not having achieved remission: Secondary | ICD-10-CM

## 2021-08-29 ENCOUNTER — Other Ambulatory Visit: Payer: Self-pay | Admitting: Hematology

## 2021-08-29 DIAGNOSIS — Z7189 Other specified counseling: Secondary | ICD-10-CM

## 2021-08-29 DIAGNOSIS — C9000 Multiple myeloma not having achieved remission: Secondary | ICD-10-CM

## 2021-08-30 NOTE — Telephone Encounter (Signed)
Refilled on 08/26/2021. Gardiner Rhyme, RN

## 2021-09-03 ENCOUNTER — Other Ambulatory Visit: Payer: Self-pay | Admitting: *Deleted

## 2021-09-03 DIAGNOSIS — C9000 Multiple myeloma not having achieved remission: Secondary | ICD-10-CM

## 2021-09-03 MED FILL — Dexamethasone Sodium Phosphate Inj 100 MG/10ML: INTRAMUSCULAR | Qty: 1 | Status: AC

## 2021-09-04 ENCOUNTER — Inpatient Hospital Stay: Payer: Medicare Other | Attending: Hematology

## 2021-09-04 ENCOUNTER — Inpatient Hospital Stay: Payer: Medicare Other

## 2021-09-04 ENCOUNTER — Other Ambulatory Visit: Payer: Self-pay

## 2021-09-04 VITALS — BP 120/76 | HR 73 | Temp 98.3°F | Resp 16 | Wt 192.8 lb

## 2021-09-04 DIAGNOSIS — Z5112 Encounter for antineoplastic immunotherapy: Secondary | ICD-10-CM | POA: Insufficient documentation

## 2021-09-04 DIAGNOSIS — C9001 Multiple myeloma in remission: Secondary | ICD-10-CM | POA: Diagnosis not present

## 2021-09-04 DIAGNOSIS — Z79899 Other long term (current) drug therapy: Secondary | ICD-10-CM | POA: Diagnosis not present

## 2021-09-04 DIAGNOSIS — C9000 Multiple myeloma not having achieved remission: Secondary | ICD-10-CM

## 2021-09-04 DIAGNOSIS — Z7189 Other specified counseling: Secondary | ICD-10-CM

## 2021-09-04 LAB — CMP (CANCER CENTER ONLY)
ALT: 16 U/L (ref 0–44)
AST: 25 U/L (ref 15–41)
Albumin: 4 g/dL (ref 3.5–5.0)
Alkaline Phosphatase: 61 U/L (ref 38–126)
Anion gap: 8 (ref 5–15)
BUN: 20 mg/dL (ref 8–23)
CO2: 24 mmol/L (ref 22–32)
Calcium: 9.3 mg/dL (ref 8.9–10.3)
Chloride: 104 mmol/L (ref 98–111)
Creatinine: 1.37 mg/dL — ABNORMAL HIGH (ref 0.61–1.24)
GFR, Estimated: 54 mL/min — ABNORMAL LOW (ref >60)
Glucose, Bld: 128 mg/dL — ABNORMAL HIGH (ref 70–99)
Potassium: 4.1 mmol/L (ref 3.5–5.1)
Sodium: 136 mmol/L (ref 135–145)
Total Bilirubin: 0.5 mg/dL (ref 0.3–1.2)
Total Protein: 6 g/dL — ABNORMAL LOW (ref 6.5–8.1)

## 2021-09-04 LAB — CBC WITH DIFFERENTIAL (CANCER CENTER ONLY)
Abs Immature Granulocytes: 0.01 10*3/uL (ref 0.00–0.07)
Basophils Absolute: 0 10*3/uL (ref 0.0–0.1)
Basophils Relative: 1 %
Eosinophils Absolute: 0.3 10*3/uL (ref 0.0–0.5)
Eosinophils Relative: 5 %
HCT: 34.5 % — ABNORMAL LOW (ref 39.0–52.0)
Hemoglobin: 11.6 g/dL — ABNORMAL LOW (ref 13.0–17.0)
Immature Granulocytes: 0 %
Lymphocytes Relative: 18 %
Lymphs Abs: 1 10*3/uL (ref 0.7–4.0)
MCH: 35.4 pg — ABNORMAL HIGH (ref 26.0–34.0)
MCHC: 33.6 g/dL (ref 30.0–36.0)
MCV: 105.2 fL — ABNORMAL HIGH (ref 80.0–100.0)
Monocytes Absolute: 0.5 10*3/uL (ref 0.1–1.0)
Monocytes Relative: 10 %
Neutro Abs: 3.6 10*3/uL (ref 1.7–7.7)
Neutrophils Relative %: 66 %
Platelet Count: 178 10*3/uL (ref 150–400)
RBC: 3.28 MIL/uL — ABNORMAL LOW (ref 4.22–5.81)
RDW: 13.5 % (ref 11.5–15.5)
WBC Count: 5.3 10*3/uL (ref 4.0–10.5)
nRBC: 0 % (ref 0.0–0.2)

## 2021-09-04 MED ORDER — SODIUM CHLORIDE 0.9% FLUSH
10.0000 mL | INTRAVENOUS | Status: DC | PRN
  Administered 2021-09-04: 10 mL

## 2021-09-04 MED ORDER — SODIUM CHLORIDE 0.9 % IV SOLN
10.0000 mg | Freq: Once | INTRAVENOUS | Status: AC
  Administered 2021-09-04: 10 mg via INTRAVENOUS
  Filled 2021-09-04: qty 10

## 2021-09-04 MED ORDER — HEPARIN SOD (PORK) LOCK FLUSH 100 UNIT/ML IV SOLN
500.0000 [IU] | Freq: Once | INTRAVENOUS | Status: AC | PRN
  Administered 2021-09-04: 500 [IU]

## 2021-09-04 MED ORDER — SODIUM CHLORIDE 0.9 % IV SOLN: Freq: Once | INTRAVENOUS | Status: AC

## 2021-09-04 MED ORDER — DEXTROSE 5 % IV SOLN
56.0000 mg/m2 | Freq: Once | INTRAVENOUS | Status: AC
  Administered 2021-09-04: 120 mg via INTRAVENOUS
  Filled 2021-09-04: qty 60

## 2021-09-04 MED ORDER — PROCHLORPERAZINE MALEATE 10 MG PO TABS
10.0000 mg | ORAL_TABLET | Freq: Once | ORAL | Status: AC
  Administered 2021-09-04: 10 mg via ORAL
  Filled 2021-09-04: qty 1

## 2021-09-04 MED ORDER — ACETAMINOPHEN 500 MG PO TABS
1000.0000 mg | ORAL_TABLET | Freq: Once | ORAL | Status: AC
  Administered 2021-09-04: 1000 mg via ORAL
  Filled 2021-09-04: qty 2

## 2021-09-04 NOTE — Progress Notes (Signed)
Per pharmacist Laddie Aquas. These are labs from lab for today not reported out yet in Epic.  Bili 0.2 Creat .80 AST 17 ALT 14  Henreitta Leber, PharmD

## 2021-09-04 NOTE — Patient Instructions (Signed)
[facility]  Discharge Instructions: Thank you for choosing [facility] to provide your oncology and hematology care.   If you have a lab appointment with the Cancer Center, please go directly to the Cancer Center and check in at the registration area.   Wear comfortable clothing and clothing appropriate for easy access to any Portacath or PICC line.   We strive to give you quality time with your provider. You may need to reschedule your appointment if you arrive late (15 or more minutes).  Arriving late affects you and other patients whose appointments are after yours.  Also, if you miss three or more appointments without notifying the office, you may be dismissed from the clinic at the provider's discretion.      For prescription refill requests, have your pharmacy contact our office and allow 72 hours for refills to be completed.    Today you received the following chemotherapy and/or immunotherapy agents carfilzomib   To help prevent nausea and vomiting after your treatment, we encourage you to take your nausea medication as directed.  BELOW ARE SYMPTOMS THAT SHOULD BE REPORTED IMMEDIATELY: . *FEVER GREATER THAN 100.4 F (38 C) OR HIGHER . *CHILLS OR SWEATING . *NAUSEA AND VOMITING THAT IS NOT CONTROLLED WITH YOUR NAUSEA MEDICATION . *UNUSUAL SHORTNESS OF BREATH . *UNUSUAL BRUISING OR BLEEDING . *URINARY PROBLEMS (pain or burning when urinating, or frequent urination) . *BOWEL PROBLEMS (unusual diarrhea, constipation, pain near the anus) . TENDERNESS IN MOUTH AND THROAT WITH OR WITHOUT PRESENCE OF ULCERS (sore throat, sores in mouth, or a toothache) . UNUSUAL RASH, SWELLING OR PAIN  . UNUSUAL VAGINAL DISCHARGE OR ITCHING   Items with * indicate a potential emergency and should be followed up as soon as possible or go to the Emergency Department if any problems should occur.  Please show the CHEMOTHERAPY ALERT CARD or IMMUNOTHERAPY ALERT  CARD at check-in to the Emergency Department and triage nurse.  Should you have questions after your visit or need to cancel or reschedule your appointment, please contact [facility]  Dept: [phone]  and follow the prompts.  Office hours are 8:00 a.m. to 4:30 p.m. Monday - Friday. Please note that voicemails left after 4:00 p.m. may not be returned until the following business day.  We are closed weekends and major holidays. You have access to a nurse at all times for urgent questions. Please call the main number to the clinic Dept: [phone] and follow the prompts.   For any non-urgent questions, you may also contact your provider using MyChart. We now offer e-Visits for anyone 18 and older to request care online for non-urgent symptoms. For details visit [email or web address].   Also download the MyChart app! Go to the app store, search "MyChart", open the app, select [facility], and log in with your MyChart username and password.  Due to Covid, a mask is required upon entering the hospital/clinic. If you do not have a mask, one will be given to you upon arrival. For doctor visits, patients may have 1 support person aged 18 or older with them. For treatment visits, patients cannot have anyone with them due to current Covid guidelines and our immunocompromised population.   

## 2021-09-07 LAB — MULTIPLE MYELOMA PANEL, SERUM
Albumin SerPl Elph-Mcnc: 3.8 g/dL (ref 2.9–4.4)
Albumin/Glob SerPl: 1.9 — ABNORMAL HIGH (ref 0.7–1.7)
Alpha 1: 0.3 g/dL (ref 0.0–0.4)
Alpha2 Glob SerPl Elph-Mcnc: 0.8 g/dL (ref 0.4–1.0)
B-Globulin SerPl Elph-Mcnc: 0.8 g/dL (ref 0.7–1.3)
Gamma Glob SerPl Elph-Mcnc: 0.2 g/dL — ABNORMAL LOW (ref 0.4–1.8)
Globulin, Total: 2.1 g/dL — ABNORMAL LOW (ref 2.2–3.9)
IgA: 15 mg/dL — ABNORMAL LOW (ref 61–437)
IgG (Immunoglobin G), Serum: 193 mg/dL — ABNORMAL LOW (ref 603–1613)
IgM (Immunoglobulin M), Srm: 8 mg/dL — ABNORMAL LOW (ref 15–143)
Total Protein ELP: 5.9 g/dL — ABNORMAL LOW (ref 6.0–8.5)

## 2021-09-17 ENCOUNTER — Other Ambulatory Visit: Payer: Self-pay

## 2021-09-17 DIAGNOSIS — C9000 Multiple myeloma not having achieved remission: Secondary | ICD-10-CM

## 2021-09-18 ENCOUNTER — Other Ambulatory Visit: Payer: Self-pay | Admitting: Hematology

## 2021-09-18 ENCOUNTER — Inpatient Hospital Stay: Payer: Medicare Other

## 2021-09-18 ENCOUNTER — Encounter: Payer: Self-pay | Admitting: Hematology

## 2021-09-18 ENCOUNTER — Other Ambulatory Visit: Payer: Self-pay

## 2021-09-18 VITALS — BP 126/75 | HR 77 | Temp 98.6°F | Resp 17 | Wt 194.8 lb

## 2021-09-18 DIAGNOSIS — C9000 Multiple myeloma not having achieved remission: Secondary | ICD-10-CM

## 2021-09-18 DIAGNOSIS — Z5112 Encounter for antineoplastic immunotherapy: Secondary | ICD-10-CM | POA: Diagnosis not present

## 2021-09-18 DIAGNOSIS — Z95828 Presence of other vascular implants and grafts: Secondary | ICD-10-CM

## 2021-09-18 DIAGNOSIS — Z7189 Other specified counseling: Secondary | ICD-10-CM

## 2021-09-18 LAB — CBC WITH DIFFERENTIAL (CANCER CENTER ONLY)
Abs Immature Granulocytes: 0.01 10*3/uL (ref 0.00–0.07)
Basophils Absolute: 0 10*3/uL (ref 0.0–0.1)
Basophils Relative: 1 %
Eosinophils Absolute: 0.2 10*3/uL (ref 0.0–0.5)
Eosinophils Relative: 4 %
HCT: 34.3 % — ABNORMAL LOW (ref 39.0–52.0)
Hemoglobin: 11.8 g/dL — ABNORMAL LOW (ref 13.0–17.0)
Immature Granulocytes: 0 %
Lymphocytes Relative: 24 %
Lymphs Abs: 1.1 10*3/uL (ref 0.7–4.0)
MCH: 36 pg — ABNORMAL HIGH (ref 26.0–34.0)
MCHC: 34.4 g/dL (ref 30.0–36.0)
MCV: 104.6 fL — ABNORMAL HIGH (ref 80.0–100.0)
Monocytes Absolute: 0.4 10*3/uL (ref 0.1–1.0)
Monocytes Relative: 9 %
Neutro Abs: 2.7 10*3/uL (ref 1.7–7.7)
Neutrophils Relative %: 62 %
Platelet Count: 175 10*3/uL (ref 150–400)
RBC: 3.28 MIL/uL — ABNORMAL LOW (ref 4.22–5.81)
RDW: 13.4 % (ref 11.5–15.5)
WBC Count: 4.5 10*3/uL (ref 4.0–10.5)
nRBC: 0 % (ref 0.0–0.2)

## 2021-09-18 LAB — CMP (CANCER CENTER ONLY)
ALT: 13 U/L (ref 0–44)
AST: 20 U/L (ref 15–41)
Albumin: 4 g/dL (ref 3.5–5.0)
Alkaline Phosphatase: 70 U/L (ref 38–126)
Anion gap: 6 (ref 5–15)
BUN: 16 mg/dL (ref 8–23)
CO2: 28 mmol/L (ref 22–32)
Calcium: 9.8 mg/dL (ref 8.9–10.3)
Chloride: 106 mmol/L (ref 98–111)
Creatinine: 1.32 mg/dL — ABNORMAL HIGH (ref 0.61–1.24)
GFR, Estimated: 57 mL/min — ABNORMAL LOW (ref >60)
Glucose, Bld: 135 mg/dL — ABNORMAL HIGH (ref 70–99)
Potassium: 4.1 mmol/L (ref 3.5–5.1)
Sodium: 140 mmol/L (ref 135–145)
Total Bilirubin: 0.6 mg/dL (ref 0.3–1.2)
Total Protein: 6 g/dL — ABNORMAL LOW (ref 6.5–8.1)

## 2021-09-18 MED ORDER — SODIUM CHLORIDE 0.9% FLUSH
10.0000 mL | Freq: Once | INTRAVENOUS | Status: AC
  Administered 2021-09-18: 10 mL

## 2021-09-18 MED ORDER — HEPARIN SOD (PORK) LOCK FLUSH 100 UNIT/ML IV SOLN
500.0000 [IU] | Freq: Once | INTRAVENOUS | Status: AC | PRN
  Administered 2021-09-18: 500 [IU]

## 2021-09-18 MED ORDER — ACETAMINOPHEN 500 MG PO TABS
1000.0000 mg | ORAL_TABLET | Freq: Once | ORAL | Status: AC
  Administered 2021-09-18: 1000 mg via ORAL
  Filled 2021-09-18: qty 2

## 2021-09-18 MED ORDER — DEXTROSE 5 % IV SOLN
56.0000 mg/m2 | Freq: Once | INTRAVENOUS | Status: AC
  Administered 2021-09-18: 120 mg via INTRAVENOUS
  Filled 2021-09-18: qty 60

## 2021-09-18 MED ORDER — SODIUM CHLORIDE 0.9 % IV SOLN
10.0000 mg | Freq: Once | INTRAVENOUS | Status: AC
  Administered 2021-09-18: 10 mg via INTRAVENOUS
  Filled 2021-09-18: qty 10

## 2021-09-18 MED ORDER — SODIUM CHLORIDE 0.9 % IV SOLN: Freq: Once | INTRAVENOUS | Status: AC

## 2021-09-18 MED ORDER — PROCHLORPERAZINE MALEATE 10 MG PO TABS
10.0000 mg | ORAL_TABLET | Freq: Once | ORAL | Status: AC
  Administered 2021-09-18: 10 mg via ORAL
  Filled 2021-09-18: qty 1

## 2021-09-18 MED ORDER — SODIUM CHLORIDE 0.9% FLUSH
10.0000 mL | INTRAVENOUS | Status: DC | PRN
  Administered 2021-09-18: 10 mL

## 2021-09-20 ENCOUNTER — Encounter: Payer: Self-pay | Admitting: Internal Medicine

## 2021-09-20 ENCOUNTER — Encounter: Payer: Self-pay | Admitting: Hematology

## 2021-09-20 ENCOUNTER — Telehealth: Payer: Self-pay | Admitting: Internal Medicine

## 2021-09-20 ENCOUNTER — Other Ambulatory Visit: Payer: Self-pay

## 2021-09-20 ENCOUNTER — Ambulatory Visit (INDEPENDENT_AMBULATORY_CARE_PROVIDER_SITE_OTHER): Payer: Medicare Other | Admitting: Internal Medicine

## 2021-09-20 VITALS — BP 118/78 | HR 62 | Temp 98.0°F

## 2021-09-20 DIAGNOSIS — C9000 Multiple myeloma not having achieved remission: Secondary | ICD-10-CM

## 2021-09-20 DIAGNOSIS — R490 Dysphonia: Secondary | ICD-10-CM | POA: Diagnosis not present

## 2021-09-20 MED ORDER — AZITHROMYCIN 250 MG PO TABS: ORAL_TABLET | ORAL | 0 refills | Status: AC

## 2021-09-20 NOTE — Telephone Encounter (Signed)
[name] ?[phone] ? ?[name] called to say he is now hoarse and getting what Precious Bard has had. He is wanting to come into office. I have asked him to take COVID test and call me back, I can schedule him for 3:15 this afternoon. ?

## 2021-09-20 NOTE — Progress Notes (Signed)
? ?  Subjective:  ? ? Patient ID: Wesley G Everard Jr., male    DOB: 11/25/1945, 75 y.o.   MRN: [id number] ? ?HPI 75 year old Male with history of multiple myeloma in remission followed at [facility] and also Dr. Kale, Oncologist here in [place] seen today with some hoarseness.  He awakened with a bit of hoarseness.  Wife was seen here earlier this week with an upper respiratory infection.  Patient has tested negative for COVID-19 today. ? ?He had COVID-19 in November 2022. ? ?History of presenting with sacral mass after recent lumbar surgery in 2020.  Patient was found to have a 3.5 cm enhancing mass in the left sacrum and a 15 mm enhancing mass in the right posterior iliac bone medially.  There was a question of sacral fracture with ill-defined edema on the left.  Patient had mild anemia in 2018 and was evaluated extensively.  He underwent colonoscopy by Dr. Gaster in 2013 that was normal.  Creatinine had been in the 1.5 range for couple of years prior to 2020.  History of impaired glucose tolerance treated with diet.  History of chronic musculoskeletal pain treated with tramadol and Voltaren in the past.  Negative cardiac stress test in January 2014 and cardiologist placed him on metoprolol for elevated blood pressure at that time. ? ?History of bilateral cataract surgery.  ? ? In November 2014 he had an acute visual disturbance where he saw a V-shaped area in his left eye that was out of focus when he closed his right eye is having both eyes open.  He was seen by neurologist.  He was subsequently placed on Plavix.  He was already taking aspirin 81 mg daily.  He had an MRI of the brain showing small vessel disease and carotid Dopplers that were normal. ? ?Our records indicate 5 COVID vaccines the last being given in May 2022.  He also has had an influenza vaccine this season in September 2022. ? ?Maybe a little bit of fatigue but not a lot of symptoms other than some hoarseness but he was  concerned with his history of multiple myeloma. ? ?Review of Systems no fever, shaking chills, nausea vomiting or headache ? ?   ?Objective:  ? Physical Exam ?Blood pressure 118/78 pulse, 62 regular ,temperature 98 degrees ,pulse oximetry 98% ?TMs are clear.  Pharynx is slightly injected.  Neck supple.  Chest clear to auscultation without rales or wheezing. ? ? ?   ?Assessment & Plan:  ?Acute upper respiratory infection.  He really does not have a lot of symptoms.  He is going to monitor his progress over the next 24 hours.  Exposed to wife who has an acute upper respiratory infection-non-COVID ? ?Plan: I have sent and prescription for Zithromax Z-PAK-2 tabs day 1 followed by 1 tab days 2 through 5 for him to start on if symptoms are worsening.  He feels he is better than he was when he called earlier this morning. ? ?

## 2021-09-20 NOTE — Patient Instructions (Signed)
Zithromax Z pak. Take 2 tabs day 1 followed by one tab days 2-5. Rest and stay well hydrated. ?

## 2021-09-21 ENCOUNTER — Encounter: Payer: Self-pay | Admitting: Hematology

## 2021-09-21 MED ORDER — TRAZODONE HCL 50 MG PO TABS: 50.0000 mg | ORAL_TABLET | Freq: Every evening | ORAL | 0 refills | Status: DC | PRN

## 2021-09-21 MED ORDER — TRAMADOL HCL 50 MG PO TABS: 50.0000 mg | ORAL_TABLET | Freq: Four times a day (QID) | ORAL | 0 refills | Status: DC | PRN

## 2021-10-01 ENCOUNTER — Other Ambulatory Visit: Payer: Self-pay

## 2021-10-01 DIAGNOSIS — C9000 Multiple myeloma not having achieved remission: Secondary | ICD-10-CM

## 2021-10-02 ENCOUNTER — Inpatient Hospital Stay (HOSPITAL_BASED_OUTPATIENT_CLINIC_OR_DEPARTMENT_OTHER): Payer: Medicare Other | Admitting: Hematology

## 2021-10-02 ENCOUNTER — Inpatient Hospital Stay: Payer: Medicare Other

## 2021-10-02 ENCOUNTER — Telehealth: Payer: Self-pay | Admitting: Internal Medicine

## 2021-10-02 ENCOUNTER — Other Ambulatory Visit: Payer: Self-pay

## 2021-10-02 ENCOUNTER — Inpatient Hospital Stay: Payer: Medicare Other | Attending: Hematology

## 2021-10-02 VITALS — BP 123/78 | HR 71 | Temp 98.1°F | Resp 17 | Ht 71.0 in | Wt 190.5 lb

## 2021-10-02 DIAGNOSIS — Z5112 Encounter for antineoplastic immunotherapy: Secondary | ICD-10-CM | POA: Insufficient documentation

## 2021-10-02 DIAGNOSIS — Z5111 Encounter for antineoplastic chemotherapy: Secondary | ICD-10-CM

## 2021-10-02 DIAGNOSIS — C9001 Multiple myeloma in remission: Secondary | ICD-10-CM

## 2021-10-02 DIAGNOSIS — Z79899 Other long term (current) drug therapy: Secondary | ICD-10-CM | POA: Diagnosis not present

## 2021-10-02 DIAGNOSIS — C9000 Multiple myeloma not having achieved remission: Secondary | ICD-10-CM

## 2021-10-02 DIAGNOSIS — Z95828 Presence of other vascular implants and grafts: Secondary | ICD-10-CM

## 2021-10-02 DIAGNOSIS — Z7189 Other specified counseling: Secondary | ICD-10-CM

## 2021-10-02 LAB — CBC WITH DIFFERENTIAL (CANCER CENTER ONLY)
Abs Immature Granulocytes: 0.02 10*3/uL (ref 0.00–0.07)
Basophils Absolute: 0 10*3/uL (ref 0.0–0.1)
Basophils Relative: 1 %
Eosinophils Absolute: 0.2 10*3/uL (ref 0.0–0.5)
Eosinophils Relative: 5 %
HCT: 35 % — ABNORMAL LOW (ref 39.0–52.0)
Hemoglobin: 11.6 g/dL — ABNORMAL LOW (ref 13.0–17.0)
Immature Granulocytes: 0 %
Lymphocytes Relative: 24 %
Lymphs Abs: 1.1 10*3/uL (ref 0.7–4.0)
MCH: 35 pg — ABNORMAL HIGH (ref 26.0–34.0)
MCHC: 33.1 g/dL (ref 30.0–36.0)
MCV: 105.7 fL — ABNORMAL HIGH (ref 80.0–100.0)
Monocytes Absolute: 0.5 10*3/uL (ref 0.1–1.0)
Monocytes Relative: 10 %
Neutro Abs: 2.7 10*3/uL (ref 1.7–7.7)
Neutrophils Relative %: 60 %
Platelet Count: 170 10*3/uL (ref 150–400)
RBC: 3.31 MIL/uL — ABNORMAL LOW (ref 4.22–5.81)
RDW: 13.4 % (ref 11.5–15.5)
WBC Count: 4.5 10*3/uL (ref 4.0–10.5)
nRBC: 0 % (ref 0.0–0.2)

## 2021-10-02 LAB — CMP (CANCER CENTER ONLY)
ALT: 14 U/L (ref 0–44)
AST: 23 U/L (ref 15–41)
Albumin: 4 g/dL (ref 3.5–5.0)
Alkaline Phosphatase: 59 U/L (ref 38–126)
Anion gap: 6 (ref 5–15)
BUN: 18 mg/dL (ref 8–23)
CO2: 26 mmol/L (ref 22–32)
Calcium: 9.4 mg/dL (ref 8.9–10.3)
Chloride: 107 mmol/L (ref 98–111)
Creatinine: 1.4 mg/dL — ABNORMAL HIGH (ref 0.61–1.24)
GFR, Estimated: 53 mL/min — ABNORMAL LOW (ref >60)
Glucose, Bld: 100 mg/dL — ABNORMAL HIGH (ref 70–99)
Potassium: 4.2 mmol/L (ref 3.5–5.1)
Sodium: 139 mmol/L (ref 135–145)
Total Bilirubin: 0.6 mg/dL (ref 0.3–1.2)
Total Protein: 5.7 g/dL — ABNORMAL LOW (ref 6.5–8.1)

## 2021-10-02 MED ORDER — PROCHLORPERAZINE MALEATE 10 MG PO TABS
10.0000 mg | ORAL_TABLET | Freq: Once | ORAL | Status: AC
  Administered 2021-10-02: 10 mg via ORAL
  Filled 2021-10-02: qty 1

## 2021-10-02 MED ORDER — SODIUM CHLORIDE 0.9 % IV SOLN
60.0000 mg | Freq: Once | INTRAVENOUS | Status: AC
  Administered 2021-10-02: 60 mg via INTRAVENOUS
  Filled 2021-10-02: qty 10

## 2021-10-02 MED ORDER — HEPARIN SOD (PORK) LOCK FLUSH 100 UNIT/ML IV SOLN
500.0000 [IU] | Freq: Once | INTRAVENOUS | Status: AC | PRN
  Administered 2021-10-02: 500 [IU]

## 2021-10-02 MED ORDER — SODIUM CHLORIDE 0.9 % IV SOLN
10.0000 mg | Freq: Once | INTRAVENOUS | Status: AC
  Administered 2021-10-02: 10 mg via INTRAVENOUS
  Filled 2021-10-02: qty 10

## 2021-10-02 MED ORDER — SODIUM CHLORIDE 0.9% FLUSH
10.0000 mL | Freq: Once | INTRAVENOUS | Status: AC
  Administered 2021-10-02: 10 mL

## 2021-10-02 MED ORDER — SODIUM CHLORIDE 0.9 % IV SOLN: Freq: Once | INTRAVENOUS | Status: AC

## 2021-10-02 MED ORDER — DEXTROSE 5 % IV SOLN
56.0000 mg/m2 | Freq: Once | INTRAVENOUS | Status: AC
  Administered 2021-10-02: 120 mg via INTRAVENOUS
  Filled 2021-10-02: qty 60

## 2021-10-02 MED ORDER — ACETAMINOPHEN 500 MG PO TABS
1000.0000 mg | ORAL_TABLET | Freq: Once | ORAL | Status: AC
  Administered 2021-10-02: 1000 mg via ORAL
  Filled 2021-10-02: qty 2

## 2021-10-02 MED ORDER — SODIUM CHLORIDE 0.9% FLUSH
10.0000 mL | INTRAVENOUS | Status: DC | PRN
  Administered 2021-10-02: 10 mL

## 2021-10-02 NOTE — Progress Notes (Signed)
Pt refused to stay for completion of [facility] infusion. MD made aware. Pt informed that it was important to finish infusion. Pt states "I want to be home for dinner and I've been here long enough." Stopped infusion, flushed port and discharged patient in stable condition. ?
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
## 2021-10-02 NOTE — Patient Instructions (Signed)
[facility]  Discharge Instructions: Thank you for choosing [facility] to provide your oncology and hematology care.   If you have a lab appointment with the Cancer Center, please go directly to the Cancer Center and check in at the registration area.   Wear comfortable clothing and clothing appropriate for easy access to any Portacath or PICC line.   We strive to give you quality time with your provider. You may need to reschedule your appointment if you arrive late (15 or more minutes).  Arriving late affects you and other patients whose appointments are after yours.  Also, if you miss three or more appointments without notifying the office, you may be dismissed from the clinic at the provider's discretion.      For prescription refill requests, have your pharmacy contact our office and allow 72 hours for refills to be completed.    Today you received the following chemotherapy and/or immunotherapy agents kyprolis      To help prevent nausea and vomiting after your treatment, we encourage you to take your nausea medication as directed.  BELOW ARE SYMPTOMS THAT SHOULD BE REPORTED IMMEDIATELY: . *FEVER GREATER THAN 100.4 F (38 C) OR HIGHER . *CHILLS OR SWEATING . *NAUSEA AND VOMITING THAT IS NOT CONTROLLED WITH YOUR NAUSEA MEDICATION . *UNUSUAL SHORTNESS OF BREATH . *UNUSUAL BRUISING OR BLEEDING . *URINARY PROBLEMS (pain or burning when urinating, or frequent urination) . *BOWEL PROBLEMS (unusual diarrhea, constipation, pain near the anus) . TENDERNESS IN MOUTH AND THROAT WITH OR WITHOUT PRESENCE OF ULCERS (sore throat, sores in mouth, or a toothache) . UNUSUAL RASH, SWELLING OR PAIN  . UNUSUAL VAGINAL DISCHARGE OR ITCHING   Items with * indicate a potential emergency and should be followed up as soon as possible or go to the Emergency Department if any problems should occur.  Please show the CHEMOTHERAPY ALERT CARD or IMMUNOTHERAPY ALERT  CARD at check-in to the Emergency Department and triage nurse.  Should you have questions after your visit or need to cancel or reschedule your appointment, please contact [facility]  Dept: [phone]  and follow the prompts.  Office hours are 8:00 a.m. to 4:30 p.m. Monday - Friday. Please note that voicemails left after 4:00 p.m. may not be returned until the following business day.  We are closed weekends and major holidays. You have access to a nurse at all times for urgent questions. Please call the main number to the clinic Dept: [phone] and follow the prompts.   For any non-urgent questions, you may also contact your provider using MyChart. We now offer e-Visits for anyone 18 and older to request care online for non-urgent symptoms. For details visit [email or web address].   Also download the MyChart app! Go to the app store, search "MyChart", open the app, select [facility], and log in with your MyChart username and password.  Due to Covid, a mask is required upon entering the hospital/clinic. If you do not have a mask, one will be given to you upon arrival. For doctor visits, patients may have 1 support person aged 18 or older with them. For treatment visits, patients cannot have anyone with them due to current Covid guidelines and our immunocompromised population.   

## 2021-10-02 NOTE — Telephone Encounter (Signed)
LVM and sent MyChart message Your appointment with Dr Renold Genta on 07/01/2022 at 2:00 PM has changed from 2:00 PM to 3:00 PM. If this does not work for you, please call and let me know and we will reschedule for something that does work. We are changing Dr Verlene Mayer schedule and she is no longer going to be doing physicals at 2:00 PM, so we are moving them all to 3:00 PM. Thank You Mariann Laster B ? ?

## 2021-10-03 ENCOUNTER — Telehealth: Payer: Self-pay | Admitting: Hematology

## 2021-10-03 NOTE — Telephone Encounter (Signed)
Scheduled follow-up appointments per 3/14 los. Patient is aware. ?

## 2021-10-08 ENCOUNTER — Encounter: Payer: Self-pay | Admitting: Hematology

## 2021-10-15 ENCOUNTER — Other Ambulatory Visit: Payer: Self-pay

## 2021-10-15 ENCOUNTER — Ambulatory Visit (HOSPITAL_COMMUNITY)
Admission: RE | Admit: 2021-10-15 | Discharge: 2021-10-15 | Disposition: A | Discharge status: Home / Self Care | Payer: Medicare Other | Source: Ambulatory Visit | Attending: Hematology | Admitting: Hematology

## 2021-10-15 DIAGNOSIS — R0609 Other forms of dyspnea: Secondary | ICD-10-CM | POA: Diagnosis not present

## 2021-10-15 DIAGNOSIS — I10 Essential (primary) hypertension: Secondary | ICD-10-CM | POA: Diagnosis not present

## 2021-10-15 DIAGNOSIS — R0600 Dyspnea, unspecified: Secondary | ICD-10-CM | POA: Diagnosis not present

## 2021-10-15 DIAGNOSIS — I340 Nonrheumatic mitral (valve) insufficiency: Secondary | ICD-10-CM | POA: Diagnosis not present

## 2021-10-15 DIAGNOSIS — G4730 Sleep apnea, unspecified: Secondary | ICD-10-CM | POA: Insufficient documentation

## 2021-10-15 DIAGNOSIS — C9001 Multiple myeloma in remission: Secondary | ICD-10-CM | POA: Insufficient documentation

## 2021-10-15 LAB — ECHOCARDIOGRAM COMPLETE
AR max vel: 2.91 cm2
AV Peak grad: 4.9 mmHg
Ao pk vel: 1.11 m/s
Area-P 1/2: 2.42 cm2
Calc EF: 51.3 %
S' Lateral: 2.3 cm
Single Plane A2C EF: 51.7 %
Single Plane A4C EF: 51.4 %

## 2021-10-16 ENCOUNTER — Inpatient Hospital Stay: Payer: Medicare Other

## 2021-10-16 ENCOUNTER — Other Ambulatory Visit: Payer: Self-pay

## 2021-10-16 ENCOUNTER — Ambulatory Visit: Payer: Medicare Other

## 2021-10-16 VITALS — BP 130/75 | HR 65 | Temp 98.4°F | Resp 18 | Wt 193.0 lb

## 2021-10-16 DIAGNOSIS — C9000 Multiple myeloma not having achieved remission: Secondary | ICD-10-CM

## 2021-10-16 DIAGNOSIS — Z7189 Other specified counseling: Secondary | ICD-10-CM

## 2021-10-16 DIAGNOSIS — Z95828 Presence of other vascular implants and grafts: Secondary | ICD-10-CM

## 2021-10-16 DIAGNOSIS — Z5112 Encounter for antineoplastic immunotherapy: Secondary | ICD-10-CM | POA: Diagnosis not present

## 2021-10-16 LAB — CMP (CANCER CENTER ONLY)
ALT: 16 U/L (ref 0–44)
AST: 23 U/L (ref 15–41)
Albumin: 3.9 g/dL (ref 3.5–5.0)
Alkaline Phosphatase: 61 U/L (ref 38–126)
Anion gap: 4 — ABNORMAL LOW (ref 5–15)
BUN: 18 mg/dL (ref 8–23)
CO2: 29 mmol/L (ref 22–32)
Calcium: 9.6 mg/dL (ref 8.9–10.3)
Chloride: 107 mmol/L (ref 98–111)
Creatinine: 1.32 mg/dL — ABNORMAL HIGH (ref 0.61–1.24)
GFR, Estimated: 57 mL/min — ABNORMAL LOW (ref >60)
Glucose, Bld: 111 mg/dL — ABNORMAL HIGH (ref 70–99)
Potassium: 3.9 mmol/L (ref 3.5–5.1)
Sodium: 140 mmol/L (ref 135–145)
Total Bilirubin: 0.6 mg/dL (ref 0.3–1.2)
Total Protein: 5.8 g/dL — ABNORMAL LOW (ref 6.5–8.1)

## 2021-10-16 LAB — CBC WITH DIFFERENTIAL (CANCER CENTER ONLY)
Abs Immature Granulocytes: 0.01 10*3/uL (ref 0.00–0.07)
Basophils Absolute: 0 10*3/uL (ref 0.0–0.1)
Basophils Relative: 0 %
Eosinophils Absolute: 0.2 10*3/uL (ref 0.0–0.5)
Eosinophils Relative: 3 %
HCT: 33.7 % — ABNORMAL LOW (ref 39.0–52.0)
Hemoglobin: 11.4 g/dL — ABNORMAL LOW (ref 13.0–17.0)
Immature Granulocytes: 0 %
Lymphocytes Relative: 22 %
Lymphs Abs: 1 10*3/uL (ref 0.7–4.0)
MCH: 36 pg — ABNORMAL HIGH (ref 26.0–34.0)
MCHC: 33.8 g/dL (ref 30.0–36.0)
MCV: 106.3 fL — ABNORMAL HIGH (ref 80.0–100.0)
Monocytes Absolute: 0.5 10*3/uL (ref 0.1–1.0)
Monocytes Relative: 10 %
Neutro Abs: 2.9 10*3/uL (ref 1.7–7.7)
Neutrophils Relative %: 65 %
Platelet Count: 144 10*3/uL — ABNORMAL LOW (ref 150–400)
RBC: 3.17 MIL/uL — ABNORMAL LOW (ref 4.22–5.81)
RDW: 13.9 % (ref 11.5–15.5)
WBC Count: 4.5 10*3/uL (ref 4.0–10.5)
nRBC: 0 % (ref 0.0–0.2)

## 2021-10-16 MED ORDER — SODIUM CHLORIDE 0.9% FLUSH
10.0000 mL | INTRAVENOUS | Status: DC | PRN
  Administered 2021-10-16: 10 mL

## 2021-10-16 MED ORDER — HEPARIN SOD (PORK) LOCK FLUSH 100 UNIT/ML IV SOLN
500.0000 [IU] | Freq: Once | INTRAVENOUS | Status: AC | PRN
  Administered 2021-10-16: 500 [IU]

## 2021-10-16 MED ORDER — ACETAMINOPHEN 500 MG PO TABS
1000.0000 mg | ORAL_TABLET | Freq: Once | ORAL | Status: AC
  Administered 2021-10-16: 1000 mg via ORAL
  Filled 2021-10-16: qty 2

## 2021-10-16 MED ORDER — SODIUM CHLORIDE 0.9 % IV SOLN: Freq: Once | INTRAVENOUS | Status: AC

## 2021-10-16 MED ORDER — PROCHLORPERAZINE MALEATE 10 MG PO TABS
10.0000 mg | ORAL_TABLET | Freq: Once | ORAL | Status: AC
  Administered 2021-10-16: 10 mg via ORAL
  Filled 2021-10-16: qty 1

## 2021-10-16 MED ORDER — SODIUM CHLORIDE 0.9 % IV SOLN
10.0000 mg | Freq: Once | INTRAVENOUS | Status: AC
  Administered 2021-10-16: 10 mg via INTRAVENOUS
  Filled 2021-10-16: qty 10

## 2021-10-16 MED ORDER — SODIUM CHLORIDE 0.9% FLUSH
10.0000 mL | Freq: Once | INTRAVENOUS | Status: AC
  Administered 2021-10-16: 10 mL

## 2021-10-16 MED ORDER — DEXTROSE 5 % IV SOLN
56.0000 mg/m2 | Freq: Once | INTRAVENOUS | Status: AC
  Administered 2021-10-16: 120 mg via INTRAVENOUS
  Filled 2021-10-16: qty 60

## 2021-10-16 NOTE — Patient Instructions (Signed)
[facility]  Discharge Instructions: Thank you for choosing [facility] to provide your oncology and hematology care.   If you have a lab appointment with the Cancer Center, please go directly to the Cancer Center and check in at the registration area.   Wear comfortable clothing and clothing appropriate for easy access to any Portacath or PICC line.   We strive to give you quality time with your provider. You may need to reschedule your appointment if you arrive late (15 or more minutes).  Arriving late affects you and other patients whose appointments are after yours.  Also, if you miss three or more appointments without notifying the office, you may be dismissed from the clinic at the provider's discretion.      For prescription refill requests, have your pharmacy contact our office and allow 72 hours for refills to be completed.    Today you received the following chemotherapy and/or immunotherapy agents kyprolis      To help prevent nausea and vomiting after your treatment, we encourage you to take your nausea medication as directed.  BELOW ARE SYMPTOMS THAT SHOULD BE REPORTED IMMEDIATELY: . *FEVER GREATER THAN 100.4 F (38 C) OR HIGHER . *CHILLS OR SWEATING . *NAUSEA AND VOMITING THAT IS NOT CONTROLLED WITH YOUR NAUSEA MEDICATION . *UNUSUAL SHORTNESS OF BREATH . *UNUSUAL BRUISING OR BLEEDING . *URINARY PROBLEMS (pain or burning when urinating, or frequent urination) . *BOWEL PROBLEMS (unusual diarrhea, constipation, pain near the anus) . TENDERNESS IN MOUTH AND THROAT WITH OR WITHOUT PRESENCE OF ULCERS (sore throat, sores in mouth, or a toothache) . UNUSUAL RASH, SWELLING OR PAIN  . UNUSUAL VAGINAL DISCHARGE OR ITCHING   Items with * indicate a potential emergency and should be followed up as soon as possible or go to the Emergency Department if any problems should occur.  Please show the CHEMOTHERAPY ALERT CARD or IMMUNOTHERAPY ALERT  CARD at check-in to the Emergency Department and triage nurse.  Should you have questions after your visit or need to cancel or reschedule your appointment, please contact [facility]  Dept: [phone]  and follow the prompts.  Office hours are 8:00 a.m. to 4:30 p.m. Monday - Friday. Please note that voicemails left after 4:00 p.m. may not be returned until the following business day.  We are closed weekends and major holidays. You have access to a nurse at all times for urgent questions. Please call the main number to the clinic Dept: [phone] and follow the prompts.   For any non-urgent questions, you may also contact your provider using MyChart. We now offer e-Visits for anyone 18 and older to request care online for non-urgent symptoms. For details visit [email or web address].   Also download the MyChart app! Go to the app store, search "MyChart", open the app, select [facility], and log in with your MyChart username and password.  Due to Covid, a mask is required upon entering the hospital/clinic. If you do not have a mask, one will be given to you upon arrival. For doctor visits, patients may have 1 support person aged 18 or older with them. For treatment visits, patients cannot have anyone with them due to current Covid guidelines and our immunocompromised population.   

## 2021-10-19 LAB — MULTIPLE MYELOMA PANEL, SERUM
Albumin SerPl Elph-Mcnc: 3.7 g/dL (ref 2.9–4.4)
Albumin/Glob SerPl: 2.1 — ABNORMAL HIGH (ref 0.7–1.7)
Alpha 1: 0.2 g/dL (ref 0.0–0.4)
Alpha2 Glob SerPl Elph-Mcnc: 0.6 g/dL (ref 0.4–1.0)
B-Globulin SerPl Elph-Mcnc: 0.8 g/dL (ref 0.7–1.3)
Gamma Glob SerPl Elph-Mcnc: 0.2 g/dL — ABNORMAL LOW (ref 0.4–1.8)
Globulin, Total: 1.8 g/dL — ABNORMAL LOW (ref 2.2–3.9)
IgA: 13 mg/dL — ABNORMAL LOW (ref 61–437)
IgG (Immunoglobin G), Serum: 199 mg/dL — ABNORMAL LOW (ref 603–1613)
IgM (Immunoglobulin M), Srm: 14 mg/dL — ABNORMAL LOW (ref 15–143)
Total Protein ELP: 5.5 g/dL — ABNORMAL LOW (ref 6.0–8.5)

## 2021-10-22 ENCOUNTER — Other Ambulatory Visit: Payer: Self-pay | Admitting: Hematology

## 2021-10-22 DIAGNOSIS — C9000 Multiple myeloma not having achieved remission: Secondary | ICD-10-CM

## 2021-10-22 DIAGNOSIS — I82419 Acute embolism and thrombosis of unspecified femoral vein: Secondary | ICD-10-CM

## 2021-10-26 ENCOUNTER — Other Ambulatory Visit: Payer: Self-pay

## 2021-10-26 DIAGNOSIS — C9000 Multiple myeloma not having achieved remission: Secondary | ICD-10-CM

## 2021-10-29 ENCOUNTER — Other Ambulatory Visit: Payer: Self-pay | Admitting: Physician Assistant

## 2021-10-29 DIAGNOSIS — C9000 Multiple myeloma not having achieved remission: Secondary | ICD-10-CM

## 2021-10-30 ENCOUNTER — Other Ambulatory Visit: Payer: Self-pay

## 2021-10-30 ENCOUNTER — Inpatient Hospital Stay: Payer: Medicare Other | Attending: Hematology

## 2021-10-30 ENCOUNTER — Other Ambulatory Visit: Payer: Medicare Other

## 2021-10-30 ENCOUNTER — Inpatient Hospital Stay: Payer: Medicare Other

## 2021-10-30 ENCOUNTER — Ambulatory Visit: Payer: Medicare Other

## 2021-10-30 ENCOUNTER — Encounter: Payer: Self-pay | Admitting: Hematology

## 2021-10-30 ENCOUNTER — Inpatient Hospital Stay (HOSPITAL_BASED_OUTPATIENT_CLINIC_OR_DEPARTMENT_OTHER): Payer: Medicare Other | Admitting: Physician Assistant

## 2021-10-30 VITALS — BP 139/79 | HR 71 | Temp 97.4°F | Resp 16 | Ht 71.0 in | Wt 193.3 lb

## 2021-10-30 DIAGNOSIS — Z7189 Other specified counseling: Secondary | ICD-10-CM

## 2021-10-30 DIAGNOSIS — Z79899 Other long term (current) drug therapy: Secondary | ICD-10-CM | POA: Diagnosis not present

## 2021-10-30 DIAGNOSIS — Z95828 Presence of other vascular implants and grafts: Secondary | ICD-10-CM

## 2021-10-30 DIAGNOSIS — Z5112 Encounter for antineoplastic immunotherapy: Secondary | ICD-10-CM | POA: Insufficient documentation

## 2021-10-30 DIAGNOSIS — C9000 Multiple myeloma not having achieved remission: Secondary | ICD-10-CM

## 2021-10-30 DIAGNOSIS — G629 Polyneuropathy, unspecified: Secondary | ICD-10-CM

## 2021-10-30 DIAGNOSIS — C9001 Multiple myeloma in remission: Secondary | ICD-10-CM | POA: Insufficient documentation

## 2021-10-30 LAB — CBC WITH DIFFERENTIAL (CANCER CENTER ONLY)
Abs Immature Granulocytes: 0.01 10*3/uL (ref 0.00–0.07)
Basophils Absolute: 0 10*3/uL (ref 0.0–0.1)
Basophils Relative: 1 %
Eosinophils Absolute: 0.1 10*3/uL (ref 0.0–0.5)
Eosinophils Relative: 3 %
HCT: 34.3 % — ABNORMAL LOW (ref 39.0–52.0)
Hemoglobin: 12 g/dL — ABNORMAL LOW (ref 13.0–17.0)
Immature Granulocytes: 0 %
Lymphocytes Relative: 26 %
Lymphs Abs: 1 10*3/uL (ref 0.7–4.0)
MCH: 36.8 pg — ABNORMAL HIGH (ref 26.0–34.0)
MCHC: 35 g/dL (ref 30.0–36.0)
MCV: 105.2 fL — ABNORMAL HIGH (ref 80.0–100.0)
Monocytes Absolute: 0.4 10*3/uL (ref 0.1–1.0)
Monocytes Relative: 11 %
Neutro Abs: 2.2 10*3/uL (ref 1.7–7.7)
Neutrophils Relative %: 59 %
Platelet Count: 161 10*3/uL (ref 150–400)
RBC: 3.26 MIL/uL — ABNORMAL LOW (ref 4.22–5.81)
RDW: 13.9 % (ref 11.5–15.5)
WBC Count: 3.6 10*3/uL — ABNORMAL LOW (ref 4.0–10.5)
nRBC: 0 % (ref 0.0–0.2)

## 2021-10-30 LAB — CMP (CANCER CENTER ONLY)
ALT: 14 U/L (ref 0–44)
AST: 22 U/L (ref 15–41)
Albumin: 4.1 g/dL (ref 3.5–5.0)
Alkaline Phosphatase: 58 U/L (ref 38–126)
Anion gap: 5 (ref 5–15)
BUN: 19 mg/dL (ref 8–23)
CO2: 29 mmol/L (ref 22–32)
Calcium: 9.8 mg/dL (ref 8.9–10.3)
Chloride: 105 mmol/L (ref 98–111)
Creatinine: 1.42 mg/dL — ABNORMAL HIGH (ref 0.61–1.24)
GFR, Estimated: 52 mL/min — ABNORMAL LOW (ref >60)
Glucose, Bld: 98 mg/dL (ref 70–99)
Potassium: 4.1 mmol/L (ref 3.5–5.1)
Sodium: 139 mmol/L (ref 135–145)
Total Bilirubin: 0.6 mg/dL (ref 0.3–1.2)
Total Protein: 6.1 g/dL — ABNORMAL LOW (ref 6.5–8.1)

## 2021-10-30 MED ORDER — SODIUM CHLORIDE 0.9 % IV SOLN
10.0000 mg | Freq: Once | INTRAVENOUS | Status: AC
  Administered 2021-10-30: 10 mg via INTRAVENOUS
  Filled 2021-10-30: qty 10

## 2021-10-30 MED ORDER — GABAPENTIN 300 MG PO CAPS: 300.0000 mg | ORAL_CAPSULE | Freq: Every evening | ORAL | 3 refills | Status: DC

## 2021-10-30 MED ORDER — DEXTROSE 5 % IV SOLN
56.0000 mg/m2 | Freq: Once | INTRAVENOUS | Status: AC
  Administered 2021-10-30: 120 mg via INTRAVENOUS
  Filled 2021-10-30: qty 60

## 2021-10-30 MED ORDER — SODIUM CHLORIDE 0.9% FLUSH
10.0000 mL | INTRAVENOUS | Status: DC | PRN
  Administered 2021-10-30: 10 mL

## 2021-10-30 MED ORDER — ACETAMINOPHEN 500 MG PO TABS
1000.0000 mg | ORAL_TABLET | Freq: Once | ORAL | Status: AC
  Administered 2021-10-30: 1000 mg via ORAL
  Filled 2021-10-30: qty 2

## 2021-10-30 MED ORDER — SODIUM CHLORIDE 0.9% FLUSH
10.0000 mL | Freq: Once | INTRAVENOUS | Status: AC
  Administered 2021-10-30: 10 mL

## 2021-10-30 MED ORDER — HEPARIN SOD (PORK) LOCK FLUSH 100 UNIT/ML IV SOLN
500.0000 [IU] | Freq: Once | INTRAVENOUS | Status: AC | PRN
  Administered 2021-10-30: 500 [IU]

## 2021-10-30 MED ORDER — PROCHLORPERAZINE MALEATE 10 MG PO TABS
10.0000 mg | ORAL_TABLET | Freq: Once | ORAL | Status: AC
  Administered 2021-10-30: 10 mg via ORAL
  Filled 2021-10-30: qty 1

## 2021-10-30 MED ORDER — SODIUM CHLORIDE 0.9 % IV SOLN: Freq: Once | INTRAVENOUS | Status: AC

## 2021-10-30 NOTE — Patient Instructions (Signed)
[facility]  Discharge Instructions: Thank you for choosing [facility] to provide your oncology and hematology care.   If you have a lab appointment with the Cancer Center, please go directly to the Cancer Center and check in at the registration area.   Wear comfortable clothing and clothing appropriate for easy access to any Portacath or PICC line.   We strive to give you quality time with your provider. You may need to reschedule your appointment if you arrive late (15 or more minutes).  Arriving late affects you and other patients whose appointments are after yours.  Also, if you miss three or more appointments without notifying the office, you may be dismissed from the clinic at the provider's discretion.      For prescription refill requests, have your pharmacy contact our office and allow 72 hours for refills to be completed.    Today you received the following chemotherapy and/or immunotherapy agent: Carfilzomib (Kyprolis).    To help prevent nausea and vomiting after your treatment, we encourage you to take your nausea medication as directed.  BELOW ARE SYMPTOMS THAT SHOULD BE REPORTED IMMEDIATELY: *FEVER GREATER THAN 100.4 F (38 C) OR HIGHER *CHILLS OR SWEATING *NAUSEA AND VOMITING THAT IS NOT CONTROLLED WITH YOUR NAUSEA MEDICATION *UNUSUAL SHORTNESS OF BREATH *UNUSUAL BRUISING OR BLEEDING *URINARY PROBLEMS (pain or burning when urinating, or frequent urination) *BOWEL PROBLEMS (unusual diarrhea, constipation, pain near the anus) TENDERNESS IN MOUTH AND THROAT WITH OR WITHOUT PRESENCE OF ULCERS (sore throat, sores in mouth, or a toothache) UNUSUAL RASH, SWELLING OR PAIN  UNUSUAL VAGINAL DISCHARGE OR ITCHING   Items with * indicate a potential emergency and should be followed up as soon as possible or go to the Emergency Department if any problems should occur.  Please show the CHEMOTHERAPY ALERT CARD or IMMUNOTHERAPY ALERT CARD at  check-in to the Emergency Department and triage nurse.  Should you have questions after your visit or need to cancel or reschedule your appointment, please contact [facility]  Dept: [phone]  and follow the prompts.  Office hours are 8:00 a.m. to 4:30 p.m. Monday - Friday. Please note that voicemails left after 4:00 p.m. may not be returned until the following business day.  We are closed weekends and major holidays. You have access to a nurse at all times for urgent questions. Please call the main number to the clinic Dept: [phone] and follow the prompts.   For any non-urgent questions, you may also contact your provider using MyChart. We now offer e-Visits for anyone 18 and older to request care online for non-urgent symptoms. For details visit [email or web address].   Also download the MyChart app! Go to the app store, search "MyChart", open the app, select [facility], and log in with your MyChart username and password.  Due to Covid, a mask is required upon entering the hospital/clinic. If you do not have a mask, one will be given to you upon arrival. For doctor visits, patients may have 1 support person aged 18 or older with them. For treatment visits, patients cannot have anyone with them due to current Covid guidelines and our immunocompromised population.   

## 2021-10-31 ENCOUNTER — Encounter: Payer: Self-pay | Admitting: Hematology

## 2021-10-31 MED ORDER — TRAZODONE HCL 50 MG PO TABS: 50.0000 mg | ORAL_TABLET | Freq: Every evening | ORAL | 0 refills | Status: DC | PRN

## 2021-10-31 MED ORDER — TRAMADOL HCL 50 MG PO TABS: 50.0000 mg | ORAL_TABLET | Freq: Four times a day (QID) | ORAL | 0 refills | Status: DC | PRN

## 2021-11-01 ENCOUNTER — Encounter: Payer: Self-pay | Admitting: Hematology

## 2021-11-01 ENCOUNTER — Other Ambulatory Visit: Payer: Self-pay | Admitting: Hematology

## 2021-11-01 DIAGNOSIS — C9000 Multiple myeloma not having achieved remission: Secondary | ICD-10-CM
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
## 2021-11-13 ENCOUNTER — Other Ambulatory Visit: Payer: Self-pay | Admitting: Hematology

## 2021-11-13 ENCOUNTER — Other Ambulatory Visit: Payer: Self-pay

## 2021-11-13 ENCOUNTER — Inpatient Hospital Stay: Payer: Medicare Other

## 2021-11-13 VITALS — BP 127/73 | HR 67 | Temp 98.5°F | Resp 16 | Wt 190.2 lb

## 2021-11-13 DIAGNOSIS — Z95828 Presence of other vascular implants and grafts: Secondary | ICD-10-CM

## 2021-11-13 DIAGNOSIS — C9000 Multiple myeloma not having achieved remission: Secondary | ICD-10-CM

## 2021-11-13 DIAGNOSIS — Z5112 Encounter for antineoplastic immunotherapy: Secondary | ICD-10-CM | POA: Diagnosis not present

## 2021-11-13 DIAGNOSIS — Z7189 Other specified counseling: Secondary | ICD-10-CM

## 2021-11-13 LAB — CMP (CANCER CENTER ONLY)
ALT: 15 U/L (ref 0–44)
AST: 24 U/L (ref 15–41)
Albumin: 4.1 g/dL (ref 3.5–5.0)
Alkaline Phosphatase: 56 U/L (ref 38–126)
Anion gap: 4 — ABNORMAL LOW (ref 5–15)
BUN: 20 mg/dL (ref 8–23)
CO2: 27 mmol/L (ref 22–32)
Calcium: 9.7 mg/dL (ref 8.9–10.3)
Chloride: 106 mmol/L (ref 98–111)
Creatinine: 1.44 mg/dL — ABNORMAL HIGH (ref 0.61–1.24)
GFR, Estimated: 51 mL/min — ABNORMAL LOW (ref >60)
Glucose, Bld: 145 mg/dL — ABNORMAL HIGH (ref 70–99)
Potassium: 4.3 mmol/L (ref 3.5–5.1)
Sodium: 137 mmol/L (ref 135–145)
Total Bilirubin: 0.5 mg/dL (ref 0.3–1.2)
Total Protein: 5.9 g/dL — ABNORMAL LOW (ref 6.5–8.1)

## 2021-11-13 LAB — CBC WITH DIFFERENTIAL (CANCER CENTER ONLY)
Abs Immature Granulocytes: 0.01 10*3/uL (ref 0.00–0.07)
Basophils Absolute: 0 10*3/uL (ref 0.0–0.1)
Basophils Relative: 1 %
Eosinophils Absolute: 0.1 10*3/uL (ref 0.0–0.5)
Eosinophils Relative: 3 %
HCT: 34.5 % — ABNORMAL LOW (ref 39.0–52.0)
Hemoglobin: 11.8 g/dL — ABNORMAL LOW (ref 13.0–17.0)
Immature Granulocytes: 0 %
Lymphocytes Relative: 27 %
Lymphs Abs: 1 10*3/uL (ref 0.7–4.0)
MCH: 36.6 pg — ABNORMAL HIGH (ref 26.0–34.0)
MCHC: 34.2 g/dL (ref 30.0–36.0)
MCV: 107.1 fL — ABNORMAL HIGH (ref 80.0–100.0)
Monocytes Absolute: 0.3 10*3/uL (ref 0.1–1.0)
Monocytes Relative: 9 %
Neutro Abs: 2.3 10*3/uL (ref 1.7–7.7)
Neutrophils Relative %: 60 %
Platelet Count: 164 10*3/uL (ref 150–400)
RBC: 3.22 MIL/uL — ABNORMAL LOW (ref 4.22–5.81)
RDW: 13.6 % (ref 11.5–15.5)
WBC Count: 3.8 10*3/uL — ABNORMAL LOW (ref 4.0–10.5)
nRBC: 0 % (ref 0.0–0.2)

## 2021-11-13 MED ORDER — HEPARIN SOD (PORK) LOCK FLUSH 100 UNIT/ML IV SOLN
500.0000 [IU] | Freq: Once | INTRAVENOUS | Status: AC | PRN
  Administered 2021-11-13: 500 [IU]

## 2021-11-13 MED ORDER — SODIUM CHLORIDE 0.9% FLUSH
10.0000 mL | INTRAVENOUS | Status: DC | PRN
  Administered 2021-11-13: 10 mL

## 2021-11-13 MED ORDER — SODIUM CHLORIDE 0.9 % IV SOLN: Freq: Once | INTRAVENOUS | Status: AC

## 2021-11-13 MED ORDER — PROCHLORPERAZINE MALEATE 10 MG PO TABS
10.0000 mg | ORAL_TABLET | Freq: Once | ORAL | Status: AC
  Administered 2021-11-13: 10 mg via ORAL
  Filled 2021-11-13: qty 1

## 2021-11-13 MED ORDER — ACETAMINOPHEN 500 MG PO TABS
1000.0000 mg | ORAL_TABLET | Freq: Once | ORAL | Status: AC
  Administered 2021-11-13: 1000 mg via ORAL
  Filled 2021-11-13: qty 2

## 2021-11-13 MED ORDER — SODIUM CHLORIDE 0.9 % IV SOLN
10.0000 mg | Freq: Once | INTRAVENOUS | Status: AC
  Administered 2021-11-13: 10 mg via INTRAVENOUS
  Filled 2021-11-13: qty 10

## 2021-11-13 MED ORDER — DEXTROSE 5 % IV SOLN
56.0000 mg/m2 | Freq: Once | INTRAVENOUS | Status: AC
  Administered 2021-11-13: 120 mg via INTRAVENOUS
  Filled 2021-11-13: qty 60

## 2021-11-13 NOTE — Patient Instructions (Signed)
[facility]  Discharge Instructions: Thank you for choosing [facility] to provide your oncology and hematology care.   If you have a lab appointment with the Cancer Center, please go directly to the Cancer Center and check in at the registration area.   Wear comfortable clothing and clothing appropriate for easy access to any Portacath or PICC line.   We strive to give you quality time with your provider. You may need to reschedule your appointment if you arrive late (15 or more minutes).  Arriving late affects you and other patients whose appointments are after yours.  Also, if you miss three or more appointments without notifying the office, you may be dismissed from the clinic at the provider's discretion.      For prescription refill requests, have your pharmacy contact our office and allow 72 hours for refills to be completed.    Today you received the following chemotherapy and/or immunotherapy agents carfilzomib   To help prevent nausea and vomiting after your treatment, we encourage you to take your nausea medication as directed.  BELOW ARE SYMPTOMS THAT SHOULD BE REPORTED IMMEDIATELY: . *FEVER GREATER THAN 100.4 F (38 C) OR HIGHER . *CHILLS OR SWEATING . *NAUSEA AND VOMITING THAT IS NOT CONTROLLED WITH YOUR NAUSEA MEDICATION . *UNUSUAL SHORTNESS OF BREATH . *UNUSUAL BRUISING OR BLEEDING . *URINARY PROBLEMS (pain or burning when urinating, or frequent urination) . *BOWEL PROBLEMS (unusual diarrhea, constipation, pain near the anus) . TENDERNESS IN MOUTH AND THROAT WITH OR WITHOUT PRESENCE OF ULCERS (sore throat, sores in mouth, or a toothache) . UNUSUAL RASH, SWELLING OR PAIN  . UNUSUAL VAGINAL DISCHARGE OR ITCHING   Items with * indicate a potential emergency and should be followed up as soon as possible or go to the Emergency Department if any problems should occur.  Please show the CHEMOTHERAPY ALERT CARD or IMMUNOTHERAPY ALERT  CARD at check-in to the Emergency Department and triage nurse.  Should you have questions after your visit or need to cancel or reschedule your appointment, please contact [facility]  Dept: [phone]  and follow the prompts.  Office hours are 8:00 a.m. to 4:30 p.m. Monday - Friday. Please note that voicemails left after 4:00 p.m. may not be returned until the following business day.  We are closed weekends and major holidays. You have access to a nurse at all times for urgent questions. Please call the main number to the clinic Dept: [phone] and follow the prompts.   For any non-urgent questions, you may also contact your provider using MyChart. We now offer e-Visits for anyone 18 and older to request care online for non-urgent symptoms. For details visit [email or web address].   Also download the MyChart app! Go to the app store, search "MyChart", open the app, select [facility], and log in with your MyChart username and password.  Due to Covid, a mask is required upon entering the hospital/clinic. If you do not have a mask, one will be given to you upon arrival. For doctor visits, patients may have 1 support person aged 18 or older with them. For treatment visits, patients cannot have anyone with them due to current Covid guidelines and our immunocompromised population.   

## 2021-11-15 LAB — MULTIPLE MYELOMA PANEL, SERUM
Albumin SerPl Elph-Mcnc: 3.9 g/dL (ref 2.9–4.4)
Albumin/Glob SerPl: 1.9 — ABNORMAL HIGH (ref 0.7–1.7)
Alpha 1: 0.3 g/dL (ref 0.0–0.4)
Alpha2 Glob SerPl Elph-Mcnc: 0.7 g/dL (ref 0.4–1.0)
B-Globulin SerPl Elph-Mcnc: 0.9 g/dL (ref 0.7–1.3)
Gamma Glob SerPl Elph-Mcnc: 0.2 g/dL — ABNORMAL LOW (ref 0.4–1.8)
Globulin, Total: 2.1 g/dL — ABNORMAL LOW (ref 2.2–3.9)
IgA: 17 mg/dL — ABNORMAL LOW (ref 61–437)
IgG (Immunoglobin G), Serum: 175 mg/dL — ABNORMAL LOW (ref 603–1613)
IgM (Immunoglobulin M), Srm: 7 mg/dL — ABNORMAL LOW (ref 15–143)
Total Protein ELP: 6 g/dL (ref 6.0–8.5)

## 2021-11-18 ENCOUNTER — Encounter: Payer: Self-pay | Admitting: Physician Assistant

## 2021-11-22 ENCOUNTER — Encounter: Payer: Self-pay | Admitting: Hematology

## 2021-11-23 ENCOUNTER — Other Ambulatory Visit: Payer: Self-pay | Admitting: Hematology and Oncology

## 2021-11-23 DIAGNOSIS — C9000 Multiple myeloma not having achieved remission: Secondary | ICD-10-CM

## 2021-11-26 ENCOUNTER — Other Ambulatory Visit: Payer: Self-pay

## 2021-11-26 DIAGNOSIS — C9000 Multiple myeloma not having achieved remission: Secondary | ICD-10-CM

## 2021-11-27 ENCOUNTER — Other Ambulatory Visit: Payer: Self-pay | Admitting: Physician Assistant

## 2021-11-27 ENCOUNTER — Inpatient Hospital Stay (HOSPITAL_BASED_OUTPATIENT_CLINIC_OR_DEPARTMENT_OTHER): Payer: Medicare Other | Admitting: Hematology

## 2021-11-27 ENCOUNTER — Encounter: Payer: Self-pay | Admitting: Hematology

## 2021-11-27 ENCOUNTER — Other Ambulatory Visit: Payer: Self-pay

## 2021-11-27 ENCOUNTER — Inpatient Hospital Stay: Payer: Medicare Other

## 2021-11-27 ENCOUNTER — Inpatient Hospital Stay: Payer: Medicare Other | Attending: Hematology

## 2021-11-27 DIAGNOSIS — Z95828 Presence of other vascular implants and grafts: Secondary | ICD-10-CM

## 2021-11-27 DIAGNOSIS — Z7189 Other specified counseling: Secondary | ICD-10-CM

## 2021-11-27 DIAGNOSIS — C9000 Multiple myeloma not having achieved remission: Secondary | ICD-10-CM

## 2021-11-27 DIAGNOSIS — Z79899 Other long term (current) drug therapy: Secondary | ICD-10-CM | POA: Diagnosis not present

## 2021-11-27 DIAGNOSIS — Z5112 Encounter for antineoplastic immunotherapy: Secondary | ICD-10-CM | POA: Diagnosis present

## 2021-11-27 DIAGNOSIS — C9001 Multiple myeloma in remission: Secondary | ICD-10-CM | POA: Insufficient documentation

## 2021-11-27 LAB — CBC WITH DIFFERENTIAL (CANCER CENTER ONLY)
Abs Immature Granulocytes: 0 10*3/uL (ref 0.00–0.07)
Basophils Absolute: 0.1 10*3/uL (ref 0.0–0.1)
Basophils Relative: 1 %
Eosinophils Absolute: 0.1 10*3/uL (ref 0.0–0.5)
Eosinophils Relative: 3 %
HCT: 35.4 % — ABNORMAL LOW (ref 39.0–52.0)
Hemoglobin: 11.8 g/dL — ABNORMAL LOW (ref 13.0–17.0)
Immature Granulocytes: 0 %
Lymphocytes Relative: 26 %
Lymphs Abs: 1.1 10*3/uL (ref 0.7–4.0)
MCH: 35.8 pg — ABNORMAL HIGH (ref 26.0–34.0)
MCHC: 33.3 g/dL (ref 30.0–36.0)
MCV: 107.3 fL — ABNORMAL HIGH (ref 80.0–100.0)
Monocytes Absolute: 0.4 10*3/uL (ref 0.1–1.0)
Monocytes Relative: 9 %
Neutro Abs: 2.6 10*3/uL (ref 1.7–7.7)
Neutrophils Relative %: 61 %
Platelet Count: 168 10*3/uL (ref 150–400)
RBC: 3.3 MIL/uL — ABNORMAL LOW (ref 4.22–5.81)
RDW: 13.4 % (ref 11.5–15.5)
WBC Count: 4.2 10*3/uL (ref 4.0–10.5)
nRBC: 0 % (ref 0.0–0.2)

## 2021-11-27 LAB — CMP (CANCER CENTER ONLY)
ALT: 20 U/L (ref 0–44)
AST: 30 U/L (ref 15–41)
Albumin: 3.9 g/dL (ref 3.5–5.0)
Alkaline Phosphatase: 52 U/L (ref 38–126)
Anion gap: 8 (ref 5–15)
BUN: 23 mg/dL (ref 8–23)
CO2: 25 mmol/L (ref 22–32)
Calcium: 9.4 mg/dL (ref 8.9–10.3)
Chloride: 106 mmol/L (ref 98–111)
Creatinine: 1.61 mg/dL — ABNORMAL HIGH (ref 0.61–1.24)
GFR, Estimated: 44 mL/min — ABNORMAL LOW (ref >60)
Glucose, Bld: 133 mg/dL — ABNORMAL HIGH (ref 70–99)
Potassium: 4.4 mmol/L (ref 3.5–5.1)
Sodium: 139 mmol/L (ref 135–145)
Total Bilirubin: 0.8 mg/dL (ref 0.3–1.2)
Total Protein: 6 g/dL — ABNORMAL LOW (ref 6.5–8.1)

## 2021-11-27 MED ORDER — ACETAMINOPHEN 500 MG PO TABS
1000.0000 mg | ORAL_TABLET | Freq: Once | ORAL | Status: AC
  Administered 2021-11-27: 1000 mg via ORAL
  Filled 2021-11-27: qty 2

## 2021-11-27 MED ORDER — TRAMADOL HCL 50 MG PO TABS: 50.0000 mg | ORAL_TABLET | Freq: Four times a day (QID) | ORAL | 0 refills | Status: DC | PRN

## 2021-11-27 MED ORDER — DEXTROSE 5 % IV SOLN
56.0000 mg/m2 | Freq: Once | INTRAVENOUS | Status: AC
  Administered 2021-11-27: 120 mg via INTRAVENOUS
  Filled 2021-11-27: qty 60

## 2021-11-27 MED ORDER — SODIUM CHLORIDE 0.9 % IV SOLN: Freq: Once | INTRAVENOUS | Status: AC

## 2021-11-27 MED ORDER — SODIUM CHLORIDE 0.9 % IV SOLN
10.0000 mg | Freq: Once | INTRAVENOUS | Status: AC
  Administered 2021-11-27: 10 mg via INTRAVENOUS
  Filled 2021-11-27: qty 10

## 2021-11-27 MED ORDER — SODIUM CHLORIDE 0.9% FLUSH
10.0000 mL | INTRAVENOUS | Status: DC | PRN
  Administered 2021-11-27: 10 mL

## 2021-11-27 MED ORDER — HEPARIN SOD (PORK) LOCK FLUSH 100 UNIT/ML IV SOLN
500.0000 [IU] | Freq: Once | INTRAVENOUS | Status: AC | PRN
  Administered 2021-11-27: 500 [IU]

## 2021-11-27 MED ORDER — PROCHLORPERAZINE MALEATE 10 MG PO TABS
10.0000 mg | ORAL_TABLET | Freq: Once | ORAL | Status: AC
  Administered 2021-11-27: 10 mg via ORAL
  Filled 2021-11-27: qty 1

## 2021-11-27 MED ORDER — SODIUM CHLORIDE 0.9% FLUSH
10.0000 mL | Freq: Once | INTRAVENOUS | Status: AC
  Administered 2021-11-27: 10 mL

## 2021-11-27 NOTE — Progress Notes (Signed)
Per Dr. Irene Limbo, okay to treat today with creatinine of 1.61 mg/dL ?

## 2021-11-27 NOTE — Patient Instructions (Signed)
[facility]  Discharge Instructions: Thank you for choosing [facility] to provide your oncology and hematology care.   If you have a lab appointment with the Cancer Center, please go directly to the Cancer Center and check in at the registration area.   Wear comfortable clothing and clothing appropriate for easy access to any Portacath or PICC line.   We strive to give you quality time with your provider. You may need to reschedule your appointment if you arrive late (15 or more minutes).  Arriving late affects you and other patients whose appointments are after yours.  Also, if you miss three or more appointments without notifying the office, you may be dismissed from the clinic at the provider's discretion.      For prescription refill requests, have your pharmacy contact our office and allow 72 hours for refills to be completed.    Today you received the following chemotherapy and/or immunotherapy agents carfilzomib   To help prevent nausea and vomiting after your treatment, we encourage you to take your nausea medication as directed.  BELOW ARE SYMPTOMS THAT SHOULD BE REPORTED IMMEDIATELY: . *FEVER GREATER THAN 100.4 F (38 C) OR HIGHER . *CHILLS OR SWEATING . *NAUSEA AND VOMITING THAT IS NOT CONTROLLED WITH YOUR NAUSEA MEDICATION . *UNUSUAL SHORTNESS OF BREATH . *UNUSUAL BRUISING OR BLEEDING . *URINARY PROBLEMS (pain or burning when urinating, or frequent urination) . *BOWEL PROBLEMS (unusual diarrhea, constipation, pain near the anus) . TENDERNESS IN MOUTH AND THROAT WITH OR WITHOUT PRESENCE OF ULCERS (sore throat, sores in mouth, or a toothache) . UNUSUAL RASH, SWELLING OR PAIN  . UNUSUAL VAGINAL DISCHARGE OR ITCHING   Items with * indicate a potential emergency and should be followed up as soon as possible or go to the Emergency Department if any problems should occur.  Please show the CHEMOTHERAPY ALERT CARD or IMMUNOTHERAPY ALERT  CARD at check-in to the Emergency Department and triage nurse.  Should you have questions after your visit or need to cancel or reschedule your appointment, please contact [facility]  Dept: [phone]  and follow the prompts.  Office hours are 8:00 a.m. to 4:30 p.m. Monday - Friday. Please note that voicemails left after 4:00 p.m. may not be returned until the following business day.  We are closed weekends and major holidays. You have access to a nurse at all times for urgent questions. Please call the main number to the clinic Dept: [phone] and follow the prompts.   For any non-urgent questions, you may also contact your provider using MyChart. We now offer e-Visits for anyone 18 and older to request care online for non-urgent symptoms. For details visit [email or web address].   Also download the MyChart app! Go to the app store, search "MyChart", open the app, select , and log in with your MyChart username and password.  Due to Covid, a mask is required upon entering the hospital/clinic. If you do not have a mask, one will be given to you upon arrival. For doctor visits, patients may have 1 support [name] aged 18 or older with them. For treatment visits, patients cannot have anyone with them due to current Covid guidelines and our immunocompromised population.   

## 2021-11-28 ENCOUNTER — Encounter: Payer: Self-pay | Admitting: Hematology

## 2021-12-03 ENCOUNTER — Encounter: Payer: Self-pay | Admitting: Hematology

## 2021-12-03 ENCOUNTER — Telehealth: Payer: Self-pay | Admitting: Hematology
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
## 2021-12-03 NOTE — Telephone Encounter (Signed)
Scheduled follow-up appointments per 5/9 los. Patient is aware. ?

## 2021-12-04 ENCOUNTER — Other Ambulatory Visit: Payer: Self-pay | Admitting: Hematology

## 2021-12-04 DIAGNOSIS — C9000 Multiple myeloma not having achieved remission: Secondary | ICD-10-CM

## 2021-12-06 ENCOUNTER — Telehealth: Payer: Self-pay

## 2021-12-06 ENCOUNTER — Encounter: Payer: Self-pay | Admitting: Hematology

## 2021-12-07 ENCOUNTER — Other Ambulatory Visit: Payer: Self-pay

## 2021-12-07 ENCOUNTER — Telehealth: Payer: Self-pay

## 2021-12-07 DIAGNOSIS — C9000 Multiple myeloma not having achieved remission: Secondary | ICD-10-CM

## 2021-12-07 NOTE — Telephone Encounter (Signed)
Pt called requesting a Oxycodone prescription due to being in a lot of pain and that his [facility] MD's suggest he gets the prescription through his oncologist. This LPN has sent Dr. Irene Limbo a message regarding this and this LPN will keep a lookout for answer. Pt and wife verbalized understanding.

## 2021-12-10 MED FILL — Dexamethasone Sodium Phosphate Inj 100 MG/10ML: INTRAMUSCULAR | Qty: 1 | Status: AC

## 2021-12-11 ENCOUNTER — Inpatient Hospital Stay: Payer: Medicare Other

## 2021-12-11 VITALS — BP 137/78 | HR 70 | Temp 97.7°F | Resp 18 | Wt 194.2 lb

## 2021-12-11 DIAGNOSIS — C9000 Multiple myeloma not having achieved remission: Secondary | ICD-10-CM

## 2021-12-11 DIAGNOSIS — Z5112 Encounter for antineoplastic immunotherapy: Secondary | ICD-10-CM | POA: Diagnosis not present

## 2021-12-11 DIAGNOSIS — Z95828 Presence of other vascular implants and grafts: Secondary | ICD-10-CM

## 2021-12-11 DIAGNOSIS — Z7189 Other specified counseling: Secondary | ICD-10-CM

## 2021-12-11 LAB — CMP (CANCER CENTER ONLY)
ALT: 16 U/L (ref 0–44)
AST: 25 U/L (ref 15–41)
Albumin: 4 g/dL (ref 3.5–5.0)
Alkaline Phosphatase: 57 U/L (ref 38–126)
Anion gap: 5 (ref 5–15)
BUN: 17 mg/dL (ref 8–23)
CO2: 28 mmol/L (ref 22–32)
Calcium: 9.3 mg/dL (ref 8.9–10.3)
Chloride: 107 mmol/L (ref 98–111)
Creatinine: 1.48 mg/dL — ABNORMAL HIGH (ref 0.61–1.24)
GFR, Estimated: 49 mL/min — ABNORMAL LOW (ref >60)
Glucose, Bld: 109 mg/dL — ABNORMAL HIGH (ref 70–99)
Potassium: 3.8 mmol/L (ref 3.5–5.1)
Sodium: 140 mmol/L (ref 135–145)
Total Bilirubin: 0.6 mg/dL (ref 0.3–1.2)
Total Protein: 6.1 g/dL — ABNORMAL LOW (ref 6.5–8.1)

## 2021-12-11 LAB — CBC WITH DIFFERENTIAL (CANCER CENTER ONLY)
Abs Immature Granulocytes: 0.01 10*3/uL (ref 0.00–0.07)
Basophils Absolute: 0 10*3/uL (ref 0.0–0.1)
Basophils Relative: 1 %
Eosinophils Absolute: 0.1 10*3/uL (ref 0.0–0.5)
Eosinophils Relative: 3 %
HCT: 34.8 % — ABNORMAL LOW (ref 39.0–52.0)
Hemoglobin: 11.9 g/dL — ABNORMAL LOW (ref 13.0–17.0)
Immature Granulocytes: 0 %
Lymphocytes Relative: 31 %
Lymphs Abs: 1.2 10*3/uL (ref 0.7–4.0)
MCH: 36.2 pg — ABNORMAL HIGH (ref 26.0–34.0)
MCHC: 34.2 g/dL (ref 30.0–36.0)
MCV: 105.8 fL — ABNORMAL HIGH (ref 80.0–100.0)
Monocytes Absolute: 0.4 10*3/uL (ref 0.1–1.0)
Monocytes Relative: 10 %
Neutro Abs: 2.2 10*3/uL (ref 1.7–7.7)
Neutrophils Relative %: 55 %
Platelet Count: 165 10*3/uL (ref 150–400)
RBC: 3.29 MIL/uL — ABNORMAL LOW (ref 4.22–5.81)
RDW: 13.3 % (ref 11.5–15.5)
WBC Count: 4 10*3/uL (ref 4.0–10.5)
nRBC: 0 % (ref 0.0–0.2)

## 2021-12-11 MED ORDER — SODIUM CHLORIDE 0.9% FLUSH
10.0000 mL | INTRAVENOUS | Status: DC | PRN
  Administered 2021-12-11: 10 mL

## 2021-12-11 MED ORDER — PROCHLORPERAZINE MALEATE 10 MG PO TABS
10.0000 mg | ORAL_TABLET | Freq: Once | ORAL | Status: AC
  Administered 2021-12-11: 10 mg via ORAL
  Filled 2021-12-11: qty 1

## 2021-12-11 MED ORDER — SODIUM CHLORIDE 0.9 % IV SOLN
10.0000 mg | Freq: Once | INTRAVENOUS | Status: AC
  Administered 2021-12-11: 10 mg via INTRAVENOUS
  Filled 2021-12-11: qty 10

## 2021-12-11 MED ORDER — SODIUM CHLORIDE 0.9% FLUSH
10.0000 mL | Freq: Once | INTRAVENOUS | Status: AC
  Administered 2021-12-11: 10 mL

## 2021-12-11 MED ORDER — SODIUM CHLORIDE 0.9 % IV SOLN: Freq: Once | INTRAVENOUS | Status: AC

## 2021-12-11 MED ORDER — ACETAMINOPHEN 500 MG PO TABS
1000.0000 mg | ORAL_TABLET | Freq: Once | ORAL | Status: AC
  Administered 2021-12-11: 1000 mg via ORAL
  Filled 2021-12-11: qty 2

## 2021-12-11 MED ORDER — HEPARIN SOD (PORK) LOCK FLUSH 100 UNIT/ML IV SOLN
500.0000 [IU] | Freq: Once | INTRAVENOUS | Status: AC | PRN
  Administered 2021-12-11: 500 [IU]

## 2021-12-11 MED ORDER — DEXTROSE 5 % IV SOLN
56.0000 mg/m2 | Freq: Once | INTRAVENOUS | Status: AC
  Administered 2021-12-11: 120 mg via INTRAVENOUS
  Filled 2021-12-11: qty 60

## 2021-12-11 NOTE — Patient Instructions (Signed)
[facility] MEDICAL [facility]COLOG[facility]  Discharge Instructions: Thank you for choosing [facility] to provide your oncology and hematology care.   If you have a lab appointment with the Cancer Center, please go directly to the Cancer Center and check in at the registration area.   Wear comfortable clothing and clothing appropriate for easy access to any Portacath or PICC line.   We strive to give you quality time with your provider. You may need to reschedule your appointment if you arrive late (15 or more minutes).  Arriving late affects you and other patients whose appointments are after yours.  Also, if you miss three or more appointments without notifying the office, you may be dismissed from the clinic at the provider's discretion.      For prescription refill requests, have your pharmacy contact our office and allow 72 hours for refills to be completed.    Today you received the following chemotherapy and/or immunotherapy agents carfilzomib   To help prevent nausea and vomiting after your treatment, we encourage you to take your nausea medication as directed.  BELOW ARE SYMPTOMS THAT SHOULD BE REPORTED IMMEDIATELY: . *FEVER GREATER THAN 100.4 F (38 C) OR HIGHER . *CHILLS OR SWEATING . *NAUSEA AND VOMITING THAT IS NOT CONTROLLED WITH YOUR NAUSEA MEDICATION . *UNUSUAL SHORTNESS OF BREATH . *UNUSUAL BRUISING OR BLEEDING . *URINARY PROBLEMS (pain or burning when urinating, or frequent urination) . *BOWEL PROBLEMS (unusual diarrhea, constipation, pain near the anus) . TENDERNESS IN MOUTH AND THROAT WITH OR WITHOUT PRESENCE OF ULCERS (sore throat, sores in mouth, or a toothache) . UNUSUAL RASH, SWELLING OR PAIN  . UNUSUAL VAGINAL DISCHARGE OR ITCHING   Items with * indicate a potential emergency and should be followed up as soon as possible or go to the Emergency Department if any problems should occur.  Please show the CHEMOTHERAPY ALERT CARD or IMMUNOTHERAPY ALERT  CARD at check-in to the Emergency Department and triage nurse.  Should you have questions after your visit or need to cancel or reschedule your appointment, please contact [facility]  Dept: [phone]  and follow the prompts.  Office hours are 8:00 a.m. to 4:30 p.m. Monday - Friday. Please note that voicemails left after 4:00 p.m. may not be returned until the following business day.  We are closed weekends and major holidays. You have access to a nurse at all times for urgent questions. Please call the main number to the clinic Dept: [phone] and follow the prompts.   For any non-urgent questions, you may also contact your provider using MyChart. We now offer e-Visits for anyone 18 and older to request care online for non-urgent symptoms. For details visit [email or web address].   Also download the MyChart app! Go to the app store, search "MyChart", open the app, select [facility], and log in with your MyChart username and password.  Due to Covid, a mask is required upon entering the hospital/clinic. If you do not have a mask, one will be given to you upon arrival. For doctor visits, patients may have 1 support person aged 18 or older with them. For treatment visits, patients cannot have anyone with them due to current Covid guidelines and our immunocompromised population.   

## 2021-12-14 LAB — MULTIPLE MYELOMA PANEL, SERUM
Albumin SerPl Elph-Mcnc: 3.7 g/dL (ref 2.9–4.4)
Albumin/Glob SerPl: 1.9 — ABNORMAL HIGH (ref 0.7–1.7)
Alpha 1: 0.2 g/dL (ref 0.0–0.4)
Alpha2 Glob SerPl Elph-Mcnc: 0.7 g/dL (ref 0.4–1.0)
B-Globulin SerPl Elph-Mcnc: 0.9 g/dL (ref 0.7–1.3)
Gamma Glob SerPl Elph-Mcnc: 0.2 g/dL — ABNORMAL LOW (ref 0.4–1.8)
Globulin, Total: 2 g/dL — ABNORMAL LOW (ref 2.2–3.9)
IgA: 14 mg/dL — ABNORMAL LOW (ref 61–437)
IgG (Immunoglobin G), Serum: 177 mg/dL — ABNORMAL LOW (ref 603–1613)
IgM (Immunoglobulin M), Srm: 6 mg/dL — ABNORMAL LOW (ref 15–143)
Total Protein ELP: 5.7 g/dL — ABNORMAL LOW (ref 6.0–8.5)

## 2021-12-18 ENCOUNTER — Encounter: Payer: Self-pay | Admitting: Hematology

## 2021-12-18 NOTE — Telephone Encounter (Signed)
Opened in error

## 2021-12-21 ENCOUNTER — Other Ambulatory Visit: Payer: Self-pay | Admitting: *Deleted

## 2021-12-21 DIAGNOSIS — C9000 Multiple myeloma not having achieved remission: Secondary | ICD-10-CM

## 2021-12-24 MED FILL — Dexamethasone Sodium Phosphate Inj 100 MG/10ML: INTRAMUSCULAR | Qty: 1 | Status: AC

## 2021-12-25 ENCOUNTER — Inpatient Hospital Stay: Payer: Medicare Other | Attending: Hematology | Admitting: Hematology

## 2021-12-25 ENCOUNTER — Inpatient Hospital Stay: Payer: Medicare Other

## 2021-12-25 ENCOUNTER — Other Ambulatory Visit: Payer: Self-pay

## 2021-12-25 ENCOUNTER — Ambulatory Visit: Payer: Medicare Other

## 2021-12-25 VITALS — BP 127/72 | HR 82 | Temp 98.1°F | Resp 18 | Ht 71.0 in | Wt 195.3 lb

## 2021-12-25 DIAGNOSIS — C9000 Multiple myeloma not having achieved remission: Secondary | ICD-10-CM

## 2021-12-25 DIAGNOSIS — Z7189 Other specified counseling: Secondary | ICD-10-CM

## 2021-12-25 DIAGNOSIS — Z5111 Encounter for antineoplastic chemotherapy: Secondary | ICD-10-CM

## 2021-12-25 DIAGNOSIS — Z95828 Presence of other vascular implants and grafts: Secondary | ICD-10-CM

## 2021-12-25 DIAGNOSIS — Z5112 Encounter for antineoplastic immunotherapy: Secondary | ICD-10-CM | POA: Diagnosis present

## 2021-12-25 DIAGNOSIS — C9001 Multiple myeloma in remission: Secondary | ICD-10-CM | POA: Diagnosis not present

## 2021-12-25 DIAGNOSIS — Z79899 Other long term (current) drug therapy: Secondary | ICD-10-CM | POA: Insufficient documentation

## 2021-12-25 LAB — CBC WITH DIFFERENTIAL (CANCER CENTER ONLY)
Abs Immature Granulocytes: 0.01 10*3/uL (ref 0.00–0.07)
Basophils Absolute: 0 10*3/uL (ref 0.0–0.1)
Basophils Relative: 1 %
Eosinophils Absolute: 0.1 10*3/uL (ref 0.0–0.5)
Eosinophils Relative: 3 %
HCT: 34.5 % — ABNORMAL LOW (ref 39.0–52.0)
Hemoglobin: 12.1 g/dL — ABNORMAL LOW (ref 13.0–17.0)
Immature Granulocytes: 0 %
Lymphocytes Relative: 32 %
Lymphs Abs: 1.4 10*3/uL (ref 0.7–4.0)
MCH: 37.5 pg — ABNORMAL HIGH (ref 26.0–34.0)
MCHC: 35.1 g/dL (ref 30.0–36.0)
MCV: 106.8 fL — ABNORMAL HIGH (ref 80.0–100.0)
Monocytes Absolute: 0.4 10*3/uL (ref 0.1–1.0)
Monocytes Relative: 9 %
Neutro Abs: 2.4 10*3/uL (ref 1.7–7.7)
Neutrophils Relative %: 55 %
Platelet Count: 160 10*3/uL (ref 150–400)
RBC: 3.23 MIL/uL — ABNORMAL LOW (ref 4.22–5.81)
RDW: 13.2 % (ref 11.5–15.5)
WBC Count: 4.5 10*3/uL (ref 4.0–10.5)
nRBC: 0 % (ref 0.0–0.2)

## 2021-12-25 LAB — CMP (CANCER CENTER ONLY)
ALT: 15 U/L (ref 0–44)
AST: 23 U/L (ref 15–41)
Albumin: 4.1 g/dL (ref 3.5–5.0)
Alkaline Phosphatase: 57 U/L (ref 38–126)
Anion gap: 7 (ref 5–15)
BUN: 17 mg/dL (ref 8–23)
CO2: 27 mmol/L (ref 22–32)
Calcium: 10 mg/dL (ref 8.9–10.3)
Chloride: 105 mmol/L (ref 98–111)
Creatinine: 1.57 mg/dL — ABNORMAL HIGH (ref 0.61–1.24)
GFR, Estimated: 46 mL/min — ABNORMAL LOW (ref >60)
Glucose, Bld: 125 mg/dL — ABNORMAL HIGH (ref 70–99)
Potassium: 4 mmol/L (ref 3.5–5.1)
Sodium: 139 mmol/L (ref 135–145)
Total Bilirubin: 0.6 mg/dL (ref 0.3–1.2)
Total Protein: 5.9 g/dL — ABNORMAL LOW (ref 6.5–8.1)

## 2021-12-25 MED ORDER — SODIUM CHLORIDE 0.9% FLUSH
10.0000 mL | INTRAVENOUS | Status: DC | PRN
  Administered 2021-12-25: 10 mL

## 2021-12-25 MED ORDER — SODIUM CHLORIDE 0.9 % IV SOLN: Freq: Once | INTRAVENOUS | Status: AC

## 2021-12-25 MED ORDER — SODIUM CHLORIDE 0.9% FLUSH
10.0000 mL | Freq: Once | INTRAVENOUS | Status: AC
  Administered 2021-12-25: 10 mL

## 2021-12-25 MED ORDER — SODIUM CHLORIDE 0.9 % IV SOLN
10.0000 mg | Freq: Once | INTRAVENOUS | Status: AC
  Administered 2021-12-25: 10 mg via INTRAVENOUS
  Filled 2021-12-25: qty 10

## 2021-12-25 MED ORDER — PROCHLORPERAZINE MALEATE 10 MG PO TABS
10.0000 mg | ORAL_TABLET | Freq: Once | ORAL | Status: AC
  Administered 2021-12-25: 10 mg via ORAL
  Filled 2021-12-25: qty 1

## 2021-12-25 MED ORDER — TRAZODONE HCL 50 MG PO TABS: 50.0000 mg | ORAL_TABLET | Freq: Every evening | ORAL | 1 refills | Status: DC | PRN

## 2021-12-25 MED ORDER — HEPARIN SOD (PORK) LOCK FLUSH 100 UNIT/ML IV SOLN
500.0000 [IU] | Freq: Once | INTRAVENOUS | Status: AC | PRN
  Administered 2021-12-25: 500 [IU]

## 2021-12-25 MED ORDER — ACETAMINOPHEN 500 MG PO TABS
1000.0000 mg | ORAL_TABLET | Freq: Once | ORAL | Status: AC
  Administered 2021-12-25: 1000 mg via ORAL
  Filled 2021-12-25: qty 2

## 2021-12-25 MED ORDER — DEXTROSE 5 % IV SOLN
56.0000 mg/m2 | Freq: Once | INTRAVENOUS | Status: AC
  Administered 2021-12-25: 120 mg via INTRAVENOUS
  Filled 2021-12-25: qty 60

## 2021-12-25 MED ORDER — TRAMADOL HCL 50 MG PO TABS: 50.0000 mg | ORAL_TABLET | Freq: Four times a day (QID) | ORAL | 0 refills | Status: DC | PRN

## 2021-12-25 NOTE — Patient Instructions (Signed)
[facility]COLOG[facility]  Discharge Instructions: Thank you for choosing [facility] to provide your oncology and hematology care.   If you have a lab appointment with the Cancer Center, please go directly to the Cancer Center and check in at the registration area.   Wear comfortable clothing and clothing appropriate for easy access to any Portacath or PICC line.   We strive to give you quality time with your provider. You may need to reschedule your appointment if you arrive late (15 or more minutes).  Arriving late affects you and other patients whose appointments are after yours.  Also, if you miss three or more appointments without notifying the office, you may be dismissed from the clinic at the provider's discretion.      For prescription refill requests, have your pharmacy contact our office and allow 72 hours for refills to be completed.    Today you received the following chemotherapy and/or immunotherapy agent: Carfilzomib (Kyprolis).    To help prevent nausea and vomiting after your treatment, we encourage you to take your nausea medication as directed.  BELOW ARE SYMPTOMS THAT SHOULD BE REPORTED IMMEDIATELY: *FEVER GREATER THAN 100.4 F (38 C) OR HIGHER *CHILLS OR SWEATING *NAUSEA AND VOMITING THAT IS NOT CONTROLLED WITH YOUR NAUSEA MEDICATION *UNUSUAL SHORTNESS OF BREATH *UNUSUAL BRUISING OR BLEEDING *URINARY PROBLEMS (pain or burning when urinating, or frequent urination) *BOWEL PROBLEMS (unusual diarrhea, constipation, pain near the anus) TENDERNESS IN MOUTH AND THROAT WITH OR WITHOUT PRESENCE OF ULCERS (sore throat, sores in mouth, or a toothache) UNUSUAL RASH, SWELLING OR PAIN  UNUSUAL VAGINAL DISCHARGE OR ITCHING   Items with * indicate a potential emergency and should be followed up as soon as possible or go to the Emergency Department if any problems should occur.  Please show the CHEMOTHERAPY ALERT CARD or IMMUNOTHERAPY ALERT CARD at  check-in to the Emergency Department and triage nurse.  Should you have questions after your visit or need to cancel or reschedule your appointment, please contact [facility]  Dept: [phone]  and follow the prompts.  Office hours are 8:00 a.m. to 4:30 p.m. Monday - Friday. Please note that voicemails left after 4:00 p.m. may not be returned until the following business day.  We are closed weekends and major holidays. You have access to a nurse at all times for urgent questions. Please call the main number to the clinic Dept: [phone] and follow the prompts.   For any non-urgent questions, you may also contact your provider using MyChart. We now offer e-Visits for anyone 18 and older to request care online for non-urgent symptoms. For details visit [email or web address].   Also download the MyChart app! Go to the app store, search "MyChart", open the app, select [facility], and log in with your MyChart username and password.  Due to Covid, a mask is required upon entering the hospital/clinic. If you do not have a mask, one will be given to you upon arrival. For doctor visits, patients may have 1 support person aged 18 or older with them. For treatment visits, patients cannot have anyone with them due to current Covid guidelines and our immunocompromised population.   

## 2021-12-25 NOTE — Progress Notes (Signed)
Pt saw MD today. Dr Irene Limbo aware of lab values: Creatinine 1.57. Per Dr Irene Limbo pt is ok for tx today.

## 2021-12-25 NOTE — Progress Notes (Signed)
Pt. declines to receive Aredia today, has appt. scheduled for 12/26/21 for treatment.

## 2021-12-26 ENCOUNTER — Ambulatory Visit: Payer: Medicare Other

## 2021-12-26 ENCOUNTER — Inpatient Hospital Stay: Payer: Medicare Other

## 2021-12-26 ENCOUNTER — Telehealth: Payer: Self-pay | Admitting: Hematology

## 2021-12-26 VITALS — BP 137/69 | HR 80 | Temp 98.5°F | Resp 18 | Wt 197.8 lb

## 2021-12-26 DIAGNOSIS — Z5112 Encounter for antineoplastic immunotherapy: Secondary | ICD-10-CM | POA: Diagnosis not present

## 2021-12-26 DIAGNOSIS — C9000 Multiple myeloma not having achieved remission: Secondary | ICD-10-CM

## 2021-12-26 DIAGNOSIS — Z95828 Presence of other vascular implants and grafts: Secondary | ICD-10-CM

## 2021-12-26 MED ORDER — HEPARIN SOD (PORK) LOCK FLUSH 100 UNIT/ML IV SOLN: 250.0000 [IU] | Freq: Once | INTRAVENOUS | Status: DC | PRN

## 2021-12-26 MED ORDER — SODIUM CHLORIDE 0.9 % IV SOLN
60.0000 mg | Freq: Once | INTRAVENOUS | Status: AC
  Administered 2021-12-26: 60 mg via INTRAVENOUS
  Filled 2021-12-26: qty 10

## 2021-12-26 MED ORDER — ALTEPLASE 2 MG IJ SOLR: 2.0000 mg | Freq: Once | INTRAMUSCULAR | Status: DC | PRN

## 2021-12-26 MED ORDER — SODIUM CHLORIDE 0.9% FLUSH
3.0000 mL | Freq: Once | INTRAVENOUS | Status: AC | PRN
  Administered 2021-12-26: 10 mL

## 2021-12-26 MED ORDER — SODIUM CHLORIDE 0.9 % IV SOLN: INTRAVENOUS | Status: DC

## 2021-12-26 MED ORDER — HEPARIN SOD (PORK) LOCK FLUSH 100 UNIT/ML IV SOLN
500.0000 [IU] | Freq: Once | INTRAVENOUS | Status: AC | PRN
  Administered 2021-12-26: 500 [IU]

## 2021-12-26 NOTE — Telephone Encounter (Signed)
Scheduled follow-up appointments per 6/6 los. Patient's wife is aware.

## 2021-12-26 NOTE — Patient Instructions (Signed)
Pamidronate Injection What is this medication? PAMIDRONATE (pa mi DROE nate) treats high calcium levels in the blood caused by cancer. It may also be used with chemotherapy to treat weakened bones caused by cancer. It can also be used to treat Paget's disease of the bone. It works by slowing down the release of calcium from bones. This lowers calcium levels in your blood. It also makes your bones stronger and less likely to break (fracture). It belongs to a group of medications called bisphosphonates. This medicine may be used for other purposes; ask your health care provider or pharmacist if you have questions. COMMON BRAND NAME(S): Aredia What should I tell my care team before I take this medication? They need to know if you have any of these conditions: Bleeding disorder Cancer Dental disease Kidney disease Low levels of calcium or other minerals in the blood Low red blood cell counts Receiving steroids, such as dexamethasone or prednisone An unusual or allergic reaction to pamidronate, other medications, foods, dyes or preservatives Pregnant or trying to get pregnant Breast-feeding How should I use this medication? This medication is injected into a vein. It is given by your care team in a hospital or clinic setting. Talk to your care team about the use of this medication in children. Special care may be needed. Overdosage: If you think you have taken too much of this medicine contact a poison control center or emergency room at once. NOTE: This medicine is only for you. Do not share this medicine with others. What if I miss a dose? Keep appointments for follow-up doses. It is important not to miss your dose. Call your care team if you are unable to keep an appointment. What may interact with this medication? Certain antibiotics given by injection Medications for inflammation or pain, such as ibuprofen, naproxen Some diuretics, such as bumetanide, furosemide Cyclosporine Parathyroid  hormone Tacrolimus Teriparatide Thalidomide This list may not describe all possible interactions. Give your health care provider a list of all the medicines, herbs, non-prescription drugs, or dietary supplements you use. Also tell them if you smoke, drink alcohol, or use illegal drugs. Some items may interact with your medicine. What should I watch for while using this medication? Visit your care team for regular checks on your progress. It may be some time before you see the benefit from this medication. Some people who take this medication have severe bone, joint, or muscle pain. This medication may also increase your risk for jaw problems or a broken thigh bone. Tell your care team right away if you have severe pain in your jaw, bones, joints, or muscles. Tell your care team if you have any pain that does not go away or that gets worse. Tell your dentist and dental surgeon that you are taking this medication. You should not have major dental surgery while on this medication. See your dentist to have a dental exam and fix any dental problems before starting this medication. Take good care of your teeth while on this medication. Make sure you see your dentist for regular follow-up appointments. You should make sure you get enough calcium and vitamin D while you are taking this medication. Discuss the foods you eat and the vitamins you take with your care team. You may need bloodwork while you are taking this medication. Talk to your care team if you wish to become pregnant or think you might be pregnant. This medication can cause serious birth defects. What side effects may I notice from receiving   this medication? Side effects that you should report to your care team as soon as possible: Allergic reactions--skin rash, itching, hives, swelling of the face, lips, tongue, or throat Kidney injury--decrease in the amount of urine, swelling of the ankles, hands, or feet Low calcium level--muscle pain or  cramps, confusion, tingling, or numbness in the hands or feet Osteonecrosis of the jaw--pain, swelling, or redness in the mouth, numbness of the jaw, poor healing after dental work, unusual discharge from the mouth, visible bones in the mouth Severe bone, joint, or muscle pain Side effects that usually do not require medical attention (report to your care team if they continue or are bothersome): Constipation Fatigue Fever Loss of appetite Nausea Pain, redness, or irritation at injection site Stomach pain This list may not describe all possible side effects. Call your doctor for medical advice about side effects. You may report side effects to FDA at 1-800-FDA-1088. Where should I keep my medication? This medication is given in a hospital or clinic. It will not be stored at home. NOTE: This sheet is a summary. It may not cover all possible information. If you have questions about this medicine, talk to your doctor, pharmacist, or health care provider.  2023 Elsevier/Gold Standard (2021-08-27 00:00:00) 

## 2021-12-31 ENCOUNTER — Encounter: Payer: Self-pay | Admitting: Hematology
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
## 2022-01-07 ENCOUNTER — Other Ambulatory Visit: Payer: Self-pay

## 2022-01-07 DIAGNOSIS — C9000 Multiple myeloma not having achieved remission: Secondary | ICD-10-CM

## 2022-01-07 MED FILL — Dexamethasone Sodium Phosphate Inj 100 MG/10ML: INTRAMUSCULAR | Qty: 1 | Status: AC

## 2022-01-08 ENCOUNTER — Other Ambulatory Visit: Payer: Self-pay

## 2022-01-08 ENCOUNTER — Inpatient Hospital Stay: Payer: Medicare Other

## 2022-01-08 VITALS — BP 123/79 | HR 70 | Temp 98.5°F | Resp 18 | Ht 71.0 in | Wt 196.0 lb

## 2022-01-08 DIAGNOSIS — C9000 Multiple myeloma not having achieved remission: Secondary | ICD-10-CM

## 2022-01-08 DIAGNOSIS — Z5112 Encounter for antineoplastic immunotherapy: Secondary | ICD-10-CM | POA: Diagnosis not present

## 2022-01-08 DIAGNOSIS — Z95828 Presence of other vascular implants and grafts: Secondary | ICD-10-CM

## 2022-01-08 DIAGNOSIS — Z7189 Other specified counseling: Secondary | ICD-10-CM

## 2022-01-08 LAB — CBC WITH DIFFERENTIAL (CANCER CENTER ONLY)
Abs Immature Granulocytes: 0.01 K/uL (ref 0.00–0.07)
Basophils Absolute: 0 K/uL (ref 0.0–0.1)
Basophils Relative: 1 %
Eosinophils Absolute: 0.1 K/uL (ref 0.0–0.5)
Eosinophils Relative: 2 %
HCT: 34.3 % — ABNORMAL LOW (ref 39.0–52.0)
Hemoglobin: 11.6 g/dL — ABNORMAL LOW (ref 13.0–17.0)
Immature Granulocytes: 0 %
Lymphocytes Relative: 35 %
Lymphs Abs: 1.4 K/uL (ref 0.7–4.0)
MCH: 36.1 pg — ABNORMAL HIGH (ref 26.0–34.0)
MCHC: 33.8 g/dL (ref 30.0–36.0)
MCV: 106.9 fL — ABNORMAL HIGH (ref 80.0–100.0)
Monocytes Absolute: 0.4 K/uL (ref 0.1–1.0)
Monocytes Relative: 10 %
Neutro Abs: 2.1 K/uL (ref 1.7–7.7)
Neutrophils Relative %: 52 %
Platelet Count: 151 K/uL (ref 150–400)
RBC: 3.21 MIL/uL — ABNORMAL LOW (ref 4.22–5.81)
RDW: 13.2 % (ref 11.5–15.5)
WBC Count: 4.1 K/uL (ref 4.0–10.5)
nRBC: 0 % (ref 0.0–0.2)

## 2022-01-08 LAB — CMP (CANCER CENTER ONLY)
ALT: 13 U/L (ref 0–44)
AST: 22 U/L (ref 15–41)
Albumin: 4 g/dL (ref 3.5–5.0)
Alkaline Phosphatase: 52 U/L (ref 38–126)
Anion gap: 6 (ref 5–15)
BUN: 17 mg/dL (ref 8–23)
CO2: 28 mmol/L (ref 22–32)
Calcium: 10.1 mg/dL (ref 8.9–10.3)
Chloride: 105 mmol/L (ref 98–111)
Creatinine: 1.56 mg/dL — ABNORMAL HIGH (ref 0.61–1.24)
GFR, Estimated: 46 mL/min — ABNORMAL LOW (ref >60)
Glucose, Bld: 124 mg/dL — ABNORMAL HIGH (ref 70–99)
Potassium: 4.3 mmol/L (ref 3.5–5.1)
Sodium: 139 mmol/L (ref 135–145)
Total Bilirubin: 0.6 mg/dL (ref 0.3–1.2)
Total Protein: 6 g/dL — ABNORMAL LOW (ref 6.5–8.1)

## 2022-01-08 MED ORDER — SODIUM CHLORIDE 0.9% FLUSH
10.0000 mL | Freq: Once | INTRAVENOUS | Status: AC
  Administered 2022-01-08: 10 mL

## 2022-01-08 MED ORDER — ACETAMINOPHEN 500 MG PO TABS
1000.0000 mg | ORAL_TABLET | Freq: Once | ORAL | Status: AC
  Administered 2022-01-08: 1000 mg via ORAL
  Filled 2022-01-08: qty 2

## 2022-01-08 MED ORDER — SODIUM CHLORIDE 0.9% FLUSH
10.0000 mL | INTRAVENOUS | Status: DC | PRN
  Administered 2022-01-08: 10 mL

## 2022-01-08 MED ORDER — PROCHLORPERAZINE MALEATE 10 MG PO TABS
10.0000 mg | ORAL_TABLET | Freq: Once | ORAL | Status: AC
  Administered 2022-01-08: 10 mg via ORAL
  Filled 2022-01-08: qty 1

## 2022-01-08 MED ORDER — HEPARIN SOD (PORK) LOCK FLUSH 100 UNIT/ML IV SOLN
500.0000 [IU] | Freq: Once | INTRAVENOUS | Status: AC | PRN
  Administered 2022-01-08: 500 [IU]

## 2022-01-08 MED ORDER — SODIUM CHLORIDE 0.9 % IV SOLN: Freq: Once | INTRAVENOUS | Status: AC

## 2022-01-08 MED ORDER — SODIUM CHLORIDE 0.9 % IV SOLN
10.0000 mg | Freq: Once | INTRAVENOUS | Status: AC
  Administered 2022-01-08: 10 mg via INTRAVENOUS
  Filled 2022-01-08: qty 10

## 2022-01-08 MED ORDER — DEXTROSE 5 % IV SOLN
56.0000 mg/m2 | Freq: Once | INTRAVENOUS | Status: AC
  Administered 2022-01-08: 120 mg via INTRAVENOUS
  Filled 2022-01-08: qty 60

## 2022-01-08 NOTE — Progress Notes (Signed)
Ok to treat D1/C19 Kyprolis w/ creatinine 1.56 per Dr. Irene Limbo

## 2022-01-08 NOTE — Patient Instructions (Signed)
[facility] MEDICAL ONCOLOGY  Discharge Instructions: Thank you for choosing [facility] to provide your oncology and hematology care.   If you have a lab appointment with the Cancer Center, please go directly to the Cancer Center and check in at the registration area.   Wear comfortable clothing and clothing appropriate for easy access to any Portacath or PICC line.   We strive to give you quality time with your provider. You may need to reschedule your appointment if you arrive late (15 or more minutes).  Arriving late affects you and other patients whose appointments are after yours.  Also, if you miss three or more appointments without notifying the office, you may be dismissed from the clinic at the provider's discretion.      For prescription refill requests, have your pharmacy contact our office and allow 72 hours for refills to be completed.    Today you received the following chemotherapy and/or immunotherapy agents carfilzomib   To help prevent nausea and vomiting after your treatment, we encourage you to take your nausea medication as directed.  BELOW ARE SYMPTOMS THAT SHOULD BE REPORTED IMMEDIATELY: . *FEVER GREATER THAN 100.4 F (38 C) OR HIGHER . *CHILLS OR SWEATING . *NAUSEA AND VOMITING THAT IS NOT CONTROLLED WITH YOUR NAUSEA MEDICATION . *UNUSUAL SHORTNESS OF BREATH . *UNUSUAL BRUISING OR BLEEDING . *URINARY PROBLEMS (pain or burning when urinating, or frequent urination) . *BOWEL PROBLEMS (unusual diarrhea, constipation, pain near the anus) . TENDERNESS IN MOUTH AND THROAT WITH OR WITHOUT PRESENCE OF ULCERS (sore throat, sores in mouth, or a toothache) . UNUSUAL RASH, SWELLING OR PAIN  . UNUSUAL VAGINAL DISCHARGE OR ITCHING   Items with * indicate a potential emergency and should be followed up as soon as possible or go to the Emergency Department if any problems should occur.  Please show the CHEMOTHERAPY ALERT CARD or IMMUNOTHERAPY ALERT  CARD at check-in to the Emergency Department and triage nurse.  Should you have questions after your visit or need to cancel or reschedule your appointment, please contact  [facility]  Dept: [phone]  and follow the prompts.  Office hours are 8:00 a.m. to 4:30 p.m. Monday - Friday. Please note that voicemails left after 4:00 p.m. may not be returned until the following business day.  We are closed weekends and major holidays. You have access to a nurse at all times for urgent questions. Please call the main number to the clinic Dept: [phone] and follow the prompts.   For any non-urgent questions, you may also contact your provider using MyChart. We now offer e-Visits for anyone 18 and older to request care online for non-urgent symptoms. For details visit [email or web address].   Also download the MyChart app! Go to the app store, search "MyChart", open the app, select , and log in with your MyChart username and password.  Due to Covid, a mask is required upon entering the hospital/clinic. If you do not have a mask, one will be given to you upon arrival. For doctor visits, patients may have 1 support person aged 18 or older with them. For treatment visits, patients cannot have anyone with them due to current Covid guidelines and our immunocompromised population.   

## 2022-01-14 LAB — MULTIPLE MYELOMA PANEL, SERUM
Albumin SerPl Elph-Mcnc: 3.6 g/dL (ref 2.9–4.4)
Albumin/Glob SerPl: 1.9 — ABNORMAL HIGH (ref 0.7–1.7)
Alpha 1: 0.3 g/dL (ref 0.0–0.4)
Alpha2 Glob SerPl Elph-Mcnc: 0.7 g/dL (ref 0.4–1.0)
B-Globulin SerPl Elph-Mcnc: 0.9 g/dL (ref 0.7–1.3)
Gamma Glob SerPl Elph-Mcnc: 0.2 g/dL — ABNORMAL LOW (ref 0.4–1.8)
Globulin, Total: 2 g/dL — ABNORMAL LOW (ref 2.2–3.9)
IgA: 14 mg/dL — ABNORMAL LOW (ref 61–437)
IgG (Immunoglobin G), Serum: 169 mg/dL — ABNORMAL LOW (ref 603–1613)
IgM (Immunoglobulin M), Srm: 12 mg/dL — ABNORMAL LOW (ref 15–143)
Total Protein ELP: 5.6 g/dL — ABNORMAL LOW (ref 6.0–8.5)

## 2022-01-16 ENCOUNTER — Other Ambulatory Visit: Payer: Self-pay

## 2022-01-16 ENCOUNTER — Other Ambulatory Visit: Payer: Self-pay | Admitting: Hematology

## 2022-01-16 DIAGNOSIS — C9001 Multiple myeloma in remission: Secondary | ICD-10-CM

## 2022-01-16 MED ORDER — TRAMADOL HCL 50 MG PO TABS: 50.0000 mg | ORAL_TABLET | Freq: Four times a day (QID) | ORAL | 0 refills | Status: DC | PRN

## 2022-01-18 ENCOUNTER — Encounter: Payer: Self-pay | Admitting: Hematology

## 2022-01-18 MED ORDER — TRAMADOL HCL 50 MG PO TABS: 50.0000 mg | ORAL_TABLET | Freq: Four times a day (QID) | ORAL | 0 refills | Status: DC | PRN

## 2022-01-21 ENCOUNTER — Other Ambulatory Visit: Payer: Self-pay

## 2022-01-21 DIAGNOSIS — C9000 Multiple myeloma not having achieved remission: Secondary | ICD-10-CM

## 2022-01-21 MED FILL — Dexamethasone Sodium Phosphate Inj 100 MG/10ML: INTRAMUSCULAR | Qty: 1 | Status: AC

## 2022-01-23 ENCOUNTER — Other Ambulatory Visit: Payer: Self-pay

## 2022-01-23 ENCOUNTER — Inpatient Hospital Stay: Payer: Medicare Other | Attending: Hematology | Admitting: Hematology

## 2022-01-23 ENCOUNTER — Inpatient Hospital Stay: Payer: Medicare Other

## 2022-01-23 VITALS — BP 120/80 | HR 60 | Temp 97.6°F | Resp 18

## 2022-01-23 VITALS — BP 127/76 | HR 65 | Temp 97.5°F | Resp 16 | Ht 71.0 in | Wt 195.8 lb

## 2022-01-23 DIAGNOSIS — C9000 Multiple myeloma not having achieved remission: Secondary | ICD-10-CM | POA: Insufficient documentation

## 2022-01-23 DIAGNOSIS — W009XXA Unspecified fall due to ice and snow, initial encounter: Secondary | ICD-10-CM

## 2022-01-23 DIAGNOSIS — Z95828 Presence of other vascular implants and grafts: Secondary | ICD-10-CM

## 2022-01-23 DIAGNOSIS — Z79899 Other long term (current) drug therapy: Secondary | ICD-10-CM | POA: Diagnosis not present

## 2022-01-23 DIAGNOSIS — Z5111 Encounter for antineoplastic chemotherapy: Secondary | ICD-10-CM

## 2022-01-23 DIAGNOSIS — Z5112 Encounter for antineoplastic immunotherapy: Secondary | ICD-10-CM | POA: Diagnosis present

## 2022-01-23 DIAGNOSIS — C9001 Multiple myeloma in remission: Secondary | ICD-10-CM | POA: Diagnosis not present

## 2022-01-23 DIAGNOSIS — Z7189 Other specified counseling: Secondary | ICD-10-CM

## 2022-01-23 LAB — CBC WITH DIFFERENTIAL (CANCER CENTER ONLY)
Abs Immature Granulocytes: 0.01 10*3/uL (ref 0.00–0.07)
Basophils Absolute: 0 10*3/uL (ref 0.0–0.1)
Basophils Relative: 1 %
Eosinophils Absolute: 0.1 10*3/uL (ref 0.0–0.5)
Eosinophils Relative: 3 %
HCT: 35 % — ABNORMAL LOW (ref 39.0–52.0)
Hemoglobin: 12.1 g/dL — ABNORMAL LOW (ref 13.0–17.0)
Immature Granulocytes: 0 %
Lymphocytes Relative: 28 %
Lymphs Abs: 1.2 10*3/uL (ref 0.7–4.0)
MCH: 36.6 pg — ABNORMAL HIGH (ref 26.0–34.0)
MCHC: 34.6 g/dL (ref 30.0–36.0)
MCV: 105.7 fL — ABNORMAL HIGH (ref 80.0–100.0)
Monocytes Absolute: 0.4 10*3/uL (ref 0.1–1.0)
Monocytes Relative: 9 %
Neutro Abs: 2.6 10*3/uL (ref 1.7–7.7)
Neutrophils Relative %: 59 %
Platelet Count: 169 10*3/uL (ref 150–400)
RBC: 3.31 MIL/uL — ABNORMAL LOW (ref 4.22–5.81)
RDW: 13.4 % (ref 11.5–15.5)
WBC Count: 4.3 10*3/uL (ref 4.0–10.5)
nRBC: 0 % (ref 0.0–0.2)

## 2022-01-23 LAB — CMP (CANCER CENTER ONLY)
ALT: 15 U/L (ref 0–44)
AST: 21 U/L (ref 15–41)
Albumin: 4.2 g/dL (ref 3.5–5.0)
Alkaline Phosphatase: 62 U/L (ref 38–126)
Anion gap: 6 (ref 5–15)
BUN: 19 mg/dL (ref 8–23)
CO2: 27 mmol/L (ref 22–32)
Calcium: 9.5 mg/dL (ref 8.9–10.3)
Chloride: 107 mmol/L (ref 98–111)
Creatinine: 1.6 mg/dL — ABNORMAL HIGH (ref 0.61–1.24)
GFR, Estimated: 45 mL/min — ABNORMAL LOW (ref >60)
Glucose, Bld: 141 mg/dL — ABNORMAL HIGH (ref 70–99)
Potassium: 3.8 mmol/L (ref 3.5–5.1)
Sodium: 140 mmol/L (ref 135–145)
Total Bilirubin: 0.6 mg/dL (ref 0.3–1.2)
Total Protein: 6 g/dL — ABNORMAL LOW (ref 6.5–8.1)

## 2022-01-23 MED ORDER — SODIUM CHLORIDE 0.9 % IV SOLN: Freq: Once | INTRAVENOUS | Status: AC

## 2022-01-23 MED ORDER — SODIUM CHLORIDE 0.9% FLUSH
10.0000 mL | INTRAVENOUS | Status: DC | PRN
  Administered 2022-01-23: 10 mL

## 2022-01-23 MED ORDER — DEXTROSE 5 % IV SOLN
56.0000 mg/m2 | Freq: Once | INTRAVENOUS | Status: AC
  Administered 2022-01-23: 120 mg via INTRAVENOUS
  Filled 2022-01-23: qty 60

## 2022-01-23 MED ORDER — HEPARIN SOD (PORK) LOCK FLUSH 100 UNIT/ML IV SOLN
500.0000 [IU] | Freq: Once | INTRAVENOUS | Status: AC | PRN
  Administered 2022-01-23: 500 [IU]

## 2022-01-23 MED ORDER — SODIUM CHLORIDE 0.9% FLUSH
10.0000 mL | Freq: Once | INTRAVENOUS | Status: AC
  Administered 2022-01-23: 10 mL

## 2022-01-23 MED ORDER — ACETAMINOPHEN 500 MG PO TABS
1000.0000 mg | ORAL_TABLET | Freq: Once | ORAL | Status: AC
  Administered 2022-01-23: 1000 mg via ORAL
  Filled 2022-01-23: qty 2

## 2022-01-23 MED ORDER — SODIUM CHLORIDE 0.9 % IV SOLN
10.0000 mg | Freq: Once | INTRAVENOUS | Status: AC
  Administered 2022-01-23: 10 mg via INTRAVENOUS
  Filled 2022-01-23: qty 10

## 2022-01-23 MED ORDER — PROCHLORPERAZINE MALEATE 10 MG PO TABS
10.0000 mg | ORAL_TABLET | Freq: Once | ORAL | Status: AC
  Administered 2022-01-23: 10 mg via ORAL
  Filled 2022-01-23: qty 1

## 2022-01-23 NOTE — Progress Notes (Signed)
Per Dr. Irene Limbo, okay to treat with creatinine of 1.6
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
## 2022-01-23 NOTE — Patient Instructions (Signed)
[facility]ICAL [facility]COLOG[facility]  Discharge Instructions: Thank you for choosing [facility] to provide your oncology and hematology care.   If you have a lab appointment with the Cancer Center, please go directly to the Cancer Center and check in at the registration area.   Wear comfortable clothing and clothing appropriate for easy access to any Portacath or PICC line.   We strive to give you quality time with your provider. You may need to reschedule your appointment if you arrive late (15 or more minutes).  Arriving late affects you and other patients whose appointments are after yours.  Also, if you miss three or more appointments without notifying the office, you may be dismissed from the clinic at the provider's discretion.      For prescription refill requests, have your pharmacy contact our office and allow 72 hours for refills to be completed.    Today you received the following chemotherapy and/or immunotherapy agents carfilzomib   To help prevent nausea and vomiting after your treatment, we encourage you to take your nausea medication as directed.  BELOW ARE SYMPTOMS THAT SHOULD BE REPORTED IMMEDIATELY: . *FEVER GREATER THAN 100.4 F (38 C) OR HIGHER . *CHILLS OR SWEATING . *NAUSEA AND VOMITING THAT IS NOT CONTROLLED WITH YOUR NAUSEA MEDICATION . *UNUSUAL SHORTNESS OF BREATH . *UNUSUAL BRUISING OR BLEEDING . *URINARY PROBLEMS (pain or burning when urinating, or frequent urination) . *BOWEL PROBLEMS (unusual diarrhea, constipation, pain near the anus) . TENDERNESS IN MOUTH AND THROAT WITH OR WITHOUT PRESENCE OF ULCERS (sore throat, sores in mouth, or a toothache) . UNUSUAL RASH, SWELLING OR PAIN  . UNUSUAL VAGINAL DISCHARGE OR ITCHING   Items with * indicate a potential emergency and should be followed up as soon as possible or go to the Emergency Department if any problems should occur.  Please show the CHEMOTHERAPY ALERT CARD or IMMUNOTHERAPY ALERT  CARD at check-in to the Emergency Department and triage nurse.  Should you have questions after your visit or need to cancel or reschedule your appointment, please contact [facility]COL[facility]  Dept: [phone]  and follow the prompts.  Office hours are 8:00 a.m. to 4:30 p.m. Monday - Friday. Please note that voicemails left after 4:00 p.m. may not be returned until the following business day.  We are closed weekends and major holidays. You have access to a nurse at all times for urgent questions. Please call the main number to the clinic Dept: [phone] and follow the prompts.   For any non-urgent questions, you may also contact your provider using MyChart. We now offer e-Visits for anyone 18 and older to request care online for non-urgent symptoms. For details visit [email or web address].   Also download the MyChart app! Go to the app store, search "MyChart", open the app, select [facility], and log in with your MyChart username and password.  Due to Covid, a mask is required upon entering the hospital/clinic. If you do not have a mask, one will be given to you upon arrival. For doctor visits, patients may have 1 support [name] aged 75 or older with them. For treatment visits, patients cannot have anyone with them due to current Covid guidelines and our immunocompromised population.   

## 2022-01-25 ENCOUNTER — Telehealth: Payer: Self-pay | Admitting: Hematology

## 2022-01-25 NOTE — Telephone Encounter (Signed)
Scheduled follow-up appointments per 7/5 los. Patient is aware. 

## 2022-01-29 ENCOUNTER — Encounter: Payer: Self-pay | Admitting: Hematology

## 2022-02-04 MED FILL — Dexamethasone Sodium Phosphate Inj 100 MG/10ML: INTRAMUSCULAR | Qty: 1 | Status: AC

## 2022-02-05 ENCOUNTER — Inpatient Hospital Stay: Payer: Medicare Other

## 2022-02-05 ENCOUNTER — Other Ambulatory Visit: Payer: Self-pay

## 2022-02-05 VITALS — BP 129/84 | HR 66 | Temp 98.1°F | Resp 18 | Ht 71.0 in | Wt 196.0 lb

## 2022-02-05 DIAGNOSIS — Z5112 Encounter for antineoplastic immunotherapy: Secondary | ICD-10-CM | POA: Diagnosis not present

## 2022-02-05 DIAGNOSIS — C9000 Multiple myeloma not having achieved remission: Secondary | ICD-10-CM

## 2022-02-05 DIAGNOSIS — Z7189 Other specified counseling: Secondary | ICD-10-CM

## 2022-02-05 DIAGNOSIS — Z95828 Presence of other vascular implants and grafts: Secondary | ICD-10-CM

## 2022-02-05 LAB — CMP (CANCER CENTER ONLY)
ALT: 15 U/L (ref 0–44)
AST: 24 U/L (ref 15–41)
Albumin: 4 g/dL (ref 3.5–5.0)
Alkaline Phosphatase: 55 U/L (ref 38–126)
Anion gap: 4 — ABNORMAL LOW (ref 5–15)
BUN: 16 mg/dL (ref 8–23)
CO2: 28 mmol/L (ref 22–32)
Calcium: 9.7 mg/dL (ref 8.9–10.3)
Chloride: 108 mmol/L (ref 98–111)
Creatinine: 1.47 mg/dL — ABNORMAL HIGH (ref 0.61–1.24)
GFR, Estimated: 49 mL/min — ABNORMAL LOW (ref >60)
Glucose, Bld: 86 mg/dL (ref 70–99)
Potassium: 4.3 mmol/L (ref 3.5–5.1)
Sodium: 140 mmol/L (ref 135–145)
Total Bilirubin: 0.5 mg/dL (ref 0.3–1.2)
Total Protein: 5.8 g/dL — ABNORMAL LOW (ref 6.5–8.1)

## 2022-02-05 LAB — CBC WITH DIFFERENTIAL (CANCER CENTER ONLY)
Abs Immature Granulocytes: 0 10*3/uL (ref 0.00–0.07)
Basophils Absolute: 0 10*3/uL (ref 0.0–0.1)
Basophils Relative: 1 %
Eosinophils Absolute: 0.1 10*3/uL (ref 0.0–0.5)
Eosinophils Relative: 3 %
HCT: 34.1 % — ABNORMAL LOW (ref 39.0–52.0)
Hemoglobin: 11.7 g/dL — ABNORMAL LOW (ref 13.0–17.0)
Immature Granulocytes: 0 %
Lymphocytes Relative: 29 %
Lymphs Abs: 1.1 10*3/uL (ref 0.7–4.0)
MCH: 36.3 pg — ABNORMAL HIGH (ref 26.0–34.0)
MCHC: 34.3 g/dL (ref 30.0–36.0)
MCV: 105.9 fL — ABNORMAL HIGH (ref 80.0–100.0)
Monocytes Absolute: 0.4 10*3/uL (ref 0.1–1.0)
Monocytes Relative: 9 %
Neutro Abs: 2.3 10*3/uL (ref 1.7–7.7)
Neutrophils Relative %: 58 %
Platelet Count: 157 10*3/uL (ref 150–400)
RBC: 3.22 MIL/uL — ABNORMAL LOW (ref 4.22–5.81)
RDW: 13.5 % (ref 11.5–15.5)
WBC Count: 3.9 10*3/uL — ABNORMAL LOW (ref 4.0–10.5)
nRBC: 0 % (ref 0.0–0.2)

## 2022-02-05 MED ORDER — DEXTROSE 5 % IV SOLN
56.0000 mg/m2 | Freq: Once | INTRAVENOUS | Status: AC
  Administered 2022-02-05: 120 mg via INTRAVENOUS
  Filled 2022-02-05: qty 60

## 2022-02-05 MED ORDER — SODIUM CHLORIDE 0.9 % IV SOLN
10.0000 mg | Freq: Once | INTRAVENOUS | Status: AC
  Administered 2022-02-05: 10 mg via INTRAVENOUS
  Filled 2022-02-05: qty 10

## 2022-02-05 MED ORDER — ACETAMINOPHEN 500 MG PO TABS
1000.0000 mg | ORAL_TABLET | Freq: Once | ORAL | Status: AC
  Administered 2022-02-05: 1000 mg via ORAL
  Filled 2022-02-05: qty 2

## 2022-02-05 MED ORDER — SODIUM CHLORIDE 0.9% FLUSH
10.0000 mL | INTRAVENOUS | Status: DC | PRN
  Administered 2022-02-05: 10 mL

## 2022-02-05 MED ORDER — SODIUM CHLORIDE 0.9 % IV SOLN: Freq: Once | INTRAVENOUS | Status: AC

## 2022-02-05 MED ORDER — PROCHLORPERAZINE MALEATE 10 MG PO TABS
10.0000 mg | ORAL_TABLET | Freq: Once | ORAL | Status: AC
  Administered 2022-02-05: 10 mg via ORAL
  Filled 2022-02-05: qty 1

## 2022-02-05 MED ORDER — SODIUM CHLORIDE 0.9% FLUSH
10.0000 mL | Freq: Once | INTRAVENOUS | Status: AC
  Administered 2022-02-05: 10 mL

## 2022-02-05 MED ORDER — HEPARIN SOD (PORK) LOCK FLUSH 100 UNIT/ML IV SOLN
500.0000 [IU] | Freq: Once | INTRAVENOUS | Status: AC | PRN
  Administered 2022-02-05: 500 [IU]

## 2022-02-05 MED ORDER — GABAPENTIN 300 MG PO CAPS: 300.0000 mg | ORAL_CAPSULE | Freq: Two times a day (BID) | ORAL | 1 refills | Status: DC

## 2022-02-05 NOTE — Patient Instructions (Signed)
[facility]COLOG[facility]   Discharge Instructions: Thank you for choosing [facility] to provide your oncology and hematology care.   If you have a lab appointment with the [facility], please go directly to the [facility] and check in at the registration area.   Wear comfortable clothing and clothing appropriate for easy access to any Portacath or PICC line.   We strive to give you quality time with your provider. You may need to reschedule your appointment if you arrive late (15 or more minutes).  Arriving late affects you and other patients whose appointments are after yours.  Also, if you miss three or more appointments without notifying the office, you may be dismissed from the clinic at the provider's discretion.      For prescription refill requests, have your pharmacy contact our office and allow 72 hours for refills to be completed.    Today you received the following chemotherapy and/or immunotherapy agents: Carfilzomib (Kyprolis)     To help prevent nausea and vomiting after your treatment, we encourage you to take your nausea medication as directed.  BELOW ARE SYMPTOMS THAT SHOULD BE REPORTED IMMEDIATELY: *FEVER GREATER THAN 100.4 F (38 C) OR HIGHER *CHILLS OR SWEATING *NAUSEA AND VOMITING THAT IS NOT CONTROLLED WITH YOUR NAUSEA MEDICATION *UNUSUAL SHORTNESS OF BREATH *UNUSUAL BRUISING OR BLEEDING *URINARY PROBLEMS (pain or burning when urinating, or frequent urination) *BOWEL PROBLEMS (unusual diarrhea, constipation, pain near the anus) TENDERNESS IN MOUTH AND THROAT WITH OR WITHOUT PRESENCE OF ULCERS (sore throat, sores in mouth, or a toothache) UNUSUAL RASH, SWELLING OR PAIN  UNUSUAL VAGINAL DISCHARGE OR ITCHING   Items with * indicate a potential emergency and should be followed up as soon as possible or go to the Emergency Department if any problems should occur.  Please show the CHEMOTHERAPY ALERT CARD or IMMUNOTHERAPY ALERT CARD  at check-in to the Emergency Department and triage nurse.  Should you have questions after your visit or need to cancel or reschedule your appointment, please contact [facility]  Dept: [phone]  and follow the prompts.  Office hours are 8:00 a.m. to 4:30 p.m. Monday - Friday. Please note that voicemails left after 4:00 p.m. may not be returned until the following business day.  We are closed weekends and major holidays. You have access to a nurse at all times for urgent questions. Please call the main number to the clinic Dept: [phone] and follow the prompts.   For any non-urgent questions, you may also contact your provider using MyChart. We now offer e-Visits for anyone 6 and older to request care online for non-urgent symptoms. For details visit [email or web address].   Also download the MyChart app! Go to the app store, search "MyChart", open the app, select [facility], and log in with your MyChart username and password.  Due to Covid, a mask is required upon entering the hospital/clinic. If you do not have a mask, one will be given to you upon arrival. For doctor visits, patients may have 1 support person aged 36 or older with them. For treatment visits, patients cannot have anyone with them due to current Covid guidelines and our immunocompromised population.

## 2022-02-08 ENCOUNTER — Other Ambulatory Visit: Payer: Self-pay

## 2022-02-08 DIAGNOSIS — C9001 Multiple myeloma in remission: Secondary | ICD-10-CM

## 2022-02-08 LAB — MULTIPLE MYELOMA PANEL, SERUM
Albumin SerPl Elph-Mcnc: 3.8 g/dL (ref 2.9–4.4)
Albumin/Glob SerPl: 2.3 — ABNORMAL HIGH (ref 0.7–1.7)
Alpha 1: 0.2 g/dL (ref 0.0–0.4)
Alpha2 Glob SerPl Elph-Mcnc: 0.6 g/dL (ref 0.4–1.0)
B-Globulin SerPl Elph-Mcnc: 0.8 g/dL (ref 0.7–1.3)
Gamma Glob SerPl Elph-Mcnc: 0.1 g/dL — ABNORMAL LOW (ref 0.4–1.8)
Globulin, Total: 1.7 g/dL — ABNORMAL LOW (ref 2.2–3.9)
IgA: 10 mg/dL — ABNORMAL LOW (ref 61–437)
IgG (Immunoglobin G), Serum: 168 mg/dL — ABNORMAL LOW (ref 603–1613)
IgM (Immunoglobulin M), Srm: 9 mg/dL — ABNORMAL LOW (ref 15–143)
Total Protein ELP: 5.5 g/dL — ABNORMAL LOW (ref 6.0–8.5)

## 2022-02-08 MED ORDER — TRAMADOL HCL 50 MG PO TABS: 50.0000 mg | ORAL_TABLET | Freq: Four times a day (QID) | ORAL | 0 refills | Status: DC | PRN

## 2022-02-11 ENCOUNTER — Other Ambulatory Visit: Payer: Self-pay

## 2022-02-18 ENCOUNTER — Other Ambulatory Visit: Payer: Self-pay | Admitting: *Deleted

## 2022-02-18 DIAGNOSIS — C9000 Multiple myeloma not having achieved remission: Secondary | ICD-10-CM

## 2022-02-18 MED FILL — Dexamethasone Sodium Phosphate Inj 100 MG/10ML: INTRAMUSCULAR | Qty: 1 | Status: AC

## 2022-02-19 ENCOUNTER — Inpatient Hospital Stay: Payer: Medicare Other

## 2022-02-19 ENCOUNTER — Inpatient Hospital Stay: Payer: Medicare Other | Attending: Hematology

## 2022-02-19 ENCOUNTER — Other Ambulatory Visit: Payer: Self-pay

## 2022-02-19 ENCOUNTER — Inpatient Hospital Stay (HOSPITAL_BASED_OUTPATIENT_CLINIC_OR_DEPARTMENT_OTHER): Payer: Medicare Other | Admitting: Hematology

## 2022-02-19 VITALS — BP 135/73 | HR 73 | Temp 98.6°F | Resp 17 | Ht 71.0 in | Wt 198.6 lb

## 2022-02-19 VITALS — BP 141/92 | HR 68 | Temp 98.0°F | Resp 18

## 2022-02-19 DIAGNOSIS — C9000 Multiple myeloma not having achieved remission: Secondary | ICD-10-CM

## 2022-02-19 DIAGNOSIS — Z5111 Encounter for antineoplastic chemotherapy: Secondary | ICD-10-CM

## 2022-02-19 DIAGNOSIS — Z5112 Encounter for antineoplastic immunotherapy: Secondary | ICD-10-CM | POA: Insufficient documentation

## 2022-02-19 DIAGNOSIS — C9001 Multiple myeloma in remission: Secondary | ICD-10-CM | POA: Insufficient documentation

## 2022-02-19 DIAGNOSIS — Z79899 Other long term (current) drug therapy: Secondary | ICD-10-CM | POA: Insufficient documentation

## 2022-02-19 DIAGNOSIS — Z7189 Other specified counseling: Secondary | ICD-10-CM

## 2022-02-19 DIAGNOSIS — Z95828 Presence of other vascular implants and grafts: Secondary | ICD-10-CM

## 2022-02-19 LAB — CBC WITH DIFFERENTIAL (CANCER CENTER ONLY)
Abs Immature Granulocytes: 0 10*3/uL (ref 0.00–0.07)
Basophils Absolute: 0 10*3/uL (ref 0.0–0.1)
Basophils Relative: 1 %
Eosinophils Absolute: 0.1 10*3/uL (ref 0.0–0.5)
Eosinophils Relative: 3 %
HCT: 34.4 % — ABNORMAL LOW (ref 39.0–52.0)
Hemoglobin: 11.9 g/dL — ABNORMAL LOW (ref 13.0–17.0)
Immature Granulocytes: 0 %
Lymphocytes Relative: 29 %
Lymphs Abs: 1.1 10*3/uL (ref 0.7–4.0)
MCH: 36.2 pg — ABNORMAL HIGH (ref 26.0–34.0)
MCHC: 34.6 g/dL (ref 30.0–36.0)
MCV: 104.6 fL — ABNORMAL HIGH (ref 80.0–100.0)
Monocytes Absolute: 0.3 10*3/uL (ref 0.1–1.0)
Monocytes Relative: 9 %
Neutro Abs: 2.2 10*3/uL (ref 1.7–7.7)
Neutrophils Relative %: 58 %
Platelet Count: 161 10*3/uL (ref 150–400)
RBC: 3.29 MIL/uL — ABNORMAL LOW (ref 4.22–5.81)
RDW: 13.2 % (ref 11.5–15.5)
WBC Count: 3.8 10*3/uL — ABNORMAL LOW (ref 4.0–10.5)
nRBC: 0 % (ref 0.0–0.2)

## 2022-02-19 LAB — CMP (CANCER CENTER ONLY)
ALT: 15 U/L (ref 0–44)
AST: 24 U/L (ref 15–41)
Albumin: 4.1 g/dL (ref 3.5–5.0)
Alkaline Phosphatase: 55 U/L (ref 38–126)
Anion gap: 5 (ref 5–15)
BUN: 15 mg/dL (ref 8–23)
CO2: 26 mmol/L (ref 22–32)
Calcium: 9.2 mg/dL (ref 8.9–10.3)
Chloride: 108 mmol/L (ref 98–111)
Creatinine: 1.41 mg/dL — ABNORMAL HIGH (ref 0.61–1.24)
GFR, Estimated: 52 mL/min — ABNORMAL LOW (ref >60)
Glucose, Bld: 117 mg/dL — ABNORMAL HIGH (ref 70–99)
Potassium: 4.3 mmol/L (ref 3.5–5.1)
Sodium: 139 mmol/L (ref 135–145)
Total Bilirubin: 0.6 mg/dL (ref 0.3–1.2)
Total Protein: 5.9 g/dL — ABNORMAL LOW (ref 6.5–8.1)

## 2022-02-19 MED ORDER — SODIUM CHLORIDE 0.9 % IV SOLN: Freq: Once | INTRAVENOUS | Status: DC

## 2022-02-19 MED ORDER — DEXTROSE 5 % IV SOLN
56.0000 mg/m2 | Freq: Once | INTRAVENOUS | Status: AC
  Administered 2022-02-19: 120 mg via INTRAVENOUS
  Filled 2022-02-19: qty 60

## 2022-02-19 MED ORDER — HEPARIN SOD (PORK) LOCK FLUSH 100 UNIT/ML IV SOLN
500.0000 [IU] | Freq: Once | INTRAVENOUS | Status: AC | PRN
  Administered 2022-02-19: 500 [IU]

## 2022-02-19 MED ORDER — SODIUM CHLORIDE 0.9 % IV SOLN: Freq: Once | INTRAVENOUS | Status: AC

## 2022-02-19 MED ORDER — ACETAMINOPHEN 500 MG PO TABS
1000.0000 mg | ORAL_TABLET | Freq: Once | ORAL | Status: AC
  Administered 2022-02-19: 1000 mg via ORAL
  Filled 2022-02-19: qty 2

## 2022-02-19 MED ORDER — SODIUM CHLORIDE 0.9% FLUSH
10.0000 mL | Freq: Once | INTRAVENOUS | Status: AC
  Administered 2022-02-19: 10 mL

## 2022-02-19 MED ORDER — SODIUM CHLORIDE 0.9% FLUSH
10.0000 mL | INTRAVENOUS | Status: DC | PRN
  Administered 2022-02-19: 10 mL

## 2022-02-19 MED ORDER — PROCHLORPERAZINE MALEATE 10 MG PO TABS
10.0000 mg | ORAL_TABLET | Freq: Once | ORAL | Status: AC
  Administered 2022-02-19: 10 mg via ORAL
  Filled 2022-02-19: qty 1

## 2022-02-19 MED ORDER — SODIUM CHLORIDE 0.9 % IV SOLN
10.0000 mg | Freq: Once | INTRAVENOUS | Status: AC
  Administered 2022-02-19: 10 mg via INTRAVENOUS
  Filled 2022-02-19: qty 10

## 2022-02-19 MED ORDER — ONDANSETRON HCL 8 MG PO TABS: 8.0000 mg | ORAL_TABLET | Freq: Three times a day (TID) | ORAL | 0 refills | Status: DC | PRN

## 2022-02-19 NOTE — Patient Instructions (Signed)
[facility]COLOG[facility]  Discharge Instructions: Thank you for choosing [facility] to provide your oncology and hematology care.   If you have a lab appointment with the Cancer Center, please go directly to the Cancer Center and check in at the registration area.   Wear comfortable clothing and clothing appropriate for easy access to any Portacath or PICC line.   We strive to give you quality time with your provider. You may need to reschedule your appointment if you arrive late (15 or more minutes).  Arriving late affects you and other patients whose appointments are after yours.  Also, if you miss three or more appointments without notifying the office, you may be dismissed from the clinic at the provider's discretion.      For prescription refill requests, have your pharmacy contact our office and allow 72 hours for refills to be completed.    Today you received the following chemotherapy and/or immunotherapy agents: Carfilzomib.      To help prevent nausea and vomiting after your treatment, we encourage you to take your nausea medication as directed.  BELOW ARE SYMPTOMS THAT SHOULD BE REPORTED IMMEDIATELY: *FEVER GREATER THAN 100.4 F (38 C) OR HIGHER *CHILLS OR SWEATING *NAUSEA AND VOMITING THAT IS NOT CONTROLLED WITH YOUR NAUSEA MEDICATION *UNUSUAL SHORTNESS OF BREATH *UNUSUAL BRUISING OR BLEEDING *URINARY PROBLEMS (pain or burning when urinating, or frequent urination) *BOWEL PROBLEMS (unusual diarrhea, constipation, pain near the anus) TENDERNESS IN MOUTH AND THROAT WITH OR WITHOUT PRESENCE OF ULCERS (sore throat, sores in mouth, or a toothache) UNUSUAL RASH, SWELLING OR PAIN  UNUSUAL VAGINAL DISCHARGE OR ITCHING   Items with * indicate a potential emergency and should be followed up as soon as possible or go to the Emergency Department if any problems should occur.  Please show the CHEMOTHERAPY ALERT CARD or IMMUNOTHERAPY ALERT CARD at check-in  to the Emergency Department and triage nurse.  Should you have questions after your visit or need to cancel or reschedule your appointment, please contact [facility]  Dept: [phone]  and follow the prompts.  Office hours are 8:00 a.m. to 4:30 p.m. Monday - Friday. Please note that voicemails left after 4:00 p.m. may not be returned until the following business day.  We are closed weekends and major holidays. You have access to a nurse at all times for urgent questions. Please call the main number to the clinic Dept: [phone] and follow the prompts.   For any non-urgent questions, you may also contact your provider using MyChart. We now offer e-Visits for anyone 18 and older to request care online for non-urgent symptoms. For details visit [email or web address].   Also download the MyChart app! Go to the app store, search "MyChart", open the app, select [facility], and log in with your MyChart username and password.  Due to Covid, a mask is required upon entering the hospital/clinic. If you do not have a mask, one will be given to you upon arrival. For doctor visits, patients may have 1 support person aged 18 or older with them. For treatment visits, patients cannot have anyone with them due to current Covid guidelines and our immunocompromised population.   

## 2022-02-20 ENCOUNTER — Other Ambulatory Visit: Payer: Self-pay

## 2022-02-20 ENCOUNTER — Telehealth: Payer: Self-pay | Admitting: Neurology

## 2022-02-20 ENCOUNTER — Telehealth: Payer: Self-pay | Admitting: Internal Medicine

## 2022-02-20 DIAGNOSIS — C9000 Multiple myeloma not having achieved remission: Secondary | ICD-10-CM

## 2022-02-20 NOTE — Telephone Encounter (Signed)
He verbalized understanding and will call them.

## 2022-02-20 NOTE — Telephone Encounter (Signed)
Pt is requesting a nurse give him a call, Pt didn't tell me why he was calling.

## 2022-02-20 NOTE — Telephone Encounter (Signed)
The patient's was last seen 09/07/19 for cerebral vascular disease, small vessel disease, hx of TIA.   I spoke to him today. Reports newer onset of neuropathy. He is on maintenance chemotherapy (multiple myeloma). He is requesting Dr. Renold Genta to put in a referral for him. He would like to see Dr. Krista Blue again.

## 2022-02-20 NOTE — Telephone Encounter (Signed)
Called the patient and let him know that the referral should come from oncology since neuropathy likely came from chemotherapy.

## 2022-02-20 NOTE — Telephone Encounter (Signed)
[facility] [phone]  [name] called wanting a referral to [facility] for neuropathy in his toes. I let him know he would need to be seen because they would want a recent office note with this in it before they would schedule him for an appointment. He stated that recently his neuropathy has gotten worse.

## 2022-02-22 ENCOUNTER — Telehealth: Payer: Self-pay | Admitting: Hematology

## 2022-02-22 NOTE — Telephone Encounter (Signed)
Scheduled follow-up appointments per 8/1 los. Patient is aware.

## 2022-02-25 ENCOUNTER — Ambulatory Visit (INDEPENDENT_AMBULATORY_CARE_PROVIDER_SITE_OTHER): Payer: Medicare Other | Admitting: Internal Medicine

## 2022-02-25 ENCOUNTER — Telehealth: Payer: Self-pay | Admitting: Internal Medicine

## 2022-02-25 ENCOUNTER — Encounter: Payer: Self-pay | Admitting: Internal Medicine

## 2022-02-25 VITALS — BP 114/80 | HR 80 | Temp 99.2°F | Wt 197.0 lb

## 2022-02-25 DIAGNOSIS — Z8579 Personal history of other malignant neoplasms of lymphoid, hematopoietic and related tissues: Secondary | ICD-10-CM

## 2022-02-25 DIAGNOSIS — W540XXA Bitten by dog, initial encounter: Secondary | ICD-10-CM

## 2022-02-25 DIAGNOSIS — S81851A Open bite, right lower leg, initial encounter: Secondary | ICD-10-CM | POA: Diagnosis not present

## 2022-02-25 DIAGNOSIS — Z23 Encounter for immunization: Secondary | ICD-10-CM

## 2022-02-25 DIAGNOSIS — Z9484 Stem cells transplant status: Secondary | ICD-10-CM

## 2022-02-25 MED ORDER — AMOXICILLIN-POT CLAVULANATE 500-125 MG PO TABS: 1.0000 | ORAL_TABLET | Freq: Three times a day (TID) | ORAL | 0 refills | Status: DC

## 2022-02-25 MED ORDER — MUPIROCIN 2 % EX OINT
1.0000 | TOPICAL_OINTMENT | Freq: Two times a day (BID) | CUTANEOUS | 0 refills | Status: DC
Start: 2022-02-25 — End: 2022-03-21

## 2022-02-25 NOTE — Telephone Encounter (Addendum)
[name] [id number]  [name] called to say he was bitten by a dog on Saturday and it is a pretty good bite and he does not know if he is up to date on his TDAP or if it needs stiches he would feel better if you look at it. The dog owner say the dog is up to date on its shots, however the dog is in quarantine for 10 days for rabies. I told him I did not think you do stiches he would have to go somewhere else for that. He said that's okay he just wants you to look at it first. Last TDAP 04/21/2005. Scheduled appointment at 11:30

## 2022-02-26 ENCOUNTER — Encounter: Payer: Self-pay | Admitting: Hematology
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
## 2022-02-28 ENCOUNTER — Other Ambulatory Visit: Payer: Self-pay | Admitting: Hematology and Oncology

## 2022-02-28 DIAGNOSIS — C9001 Multiple myeloma in remission: Secondary | ICD-10-CM

## 2022-03-01 ENCOUNTER — Telehealth: Payer: Self-pay | Admitting: Internal Medicine

## 2022-03-01 ENCOUNTER — Other Ambulatory Visit: Payer: Self-pay | Admitting: *Deleted

## 2022-03-01 ENCOUNTER — Encounter: Payer: Self-pay | Admitting: Internal Medicine

## 2022-03-01 ENCOUNTER — Ambulatory Visit: Payer: Medicare Other | Admitting: Internal Medicine

## 2022-03-01 VITALS — BP 112/80 | HR 82 | Temp 98.3°F | Wt 193.8 lb

## 2022-03-01 DIAGNOSIS — Z8579 Personal history of other malignant neoplasms of lymphoid, hematopoietic and related tissues: Secondary | ICD-10-CM

## 2022-03-01 DIAGNOSIS — Z7901 Long term (current) use of anticoagulants: Secondary | ICD-10-CM | POA: Diagnosis not present

## 2022-03-01 DIAGNOSIS — C9000 Multiple myeloma not having achieved remission: Secondary | ICD-10-CM

## 2022-03-01 DIAGNOSIS — S81851D Open bite, right lower leg, subsequent encounter: Secondary | ICD-10-CM

## 2022-03-01 DIAGNOSIS — H6123 Impacted cerumen, bilateral: Secondary | ICD-10-CM | POA: Diagnosis not present

## 2022-03-01 DIAGNOSIS — Z9484 Stem cells transplant status: Secondary | ICD-10-CM

## 2022-03-01 DIAGNOSIS — W540XXD Bitten by dog, subsequent encounter: Secondary | ICD-10-CM

## 2022-03-01 NOTE — Telephone Encounter (Signed)
[name] [phone]  [name] called to see if he could get his ears cleaned out when he comes to have his wound looked at.

## 2022-03-01 NOTE — Telephone Encounter (Signed)
I let patient know he would need to go to ENT doctor or Urgent Care

## 2022-03-01 NOTE — Progress Notes (Deleted)
Follow up.

## 2022-03-03 ENCOUNTER — Encounter: Payer: Self-pay | Admitting: Hematology

## 2022-03-04 ENCOUNTER — Encounter: Payer: Self-pay | Admitting: Hematology

## 2022-03-04 MED ORDER — TRAMADOL HCL 50 MG PO TABS: 50.0000 mg | ORAL_TABLET | Freq: Four times a day (QID) | ORAL | 0 refills | Status: DC | PRN

## 2022-03-04 MED FILL — Dexamethasone Sodium Phosphate Inj 100 MG/10ML: INTRAMUSCULAR | Qty: 1 | Status: AC

## 2022-03-05 ENCOUNTER — Inpatient Hospital Stay: Payer: Medicare Other

## 2022-03-05 ENCOUNTER — Other Ambulatory Visit: Payer: Self-pay | Admitting: Hematology

## 2022-03-05 ENCOUNTER — Other Ambulatory Visit: Payer: Self-pay

## 2022-03-05 VITALS — BP 111/70 | HR 77 | Temp 97.8°F | Resp 18 | Wt 191.8 lb

## 2022-03-05 DIAGNOSIS — C9000 Multiple myeloma not having achieved remission: Secondary | ICD-10-CM

## 2022-03-05 DIAGNOSIS — Z5112 Encounter for antineoplastic immunotherapy: Secondary | ICD-10-CM | POA: Diagnosis not present

## 2022-03-05 DIAGNOSIS — Z95828 Presence of other vascular implants and grafts: Secondary | ICD-10-CM

## 2022-03-05 DIAGNOSIS — Z7189 Other specified counseling: Secondary | ICD-10-CM

## 2022-03-05 LAB — CBC WITH DIFFERENTIAL (CANCER CENTER ONLY)
Abs Immature Granulocytes: 0.01 10*3/uL (ref 0.00–0.07)
Basophils Absolute: 0 10*3/uL (ref 0.0–0.1)
Basophils Relative: 1 %
Eosinophils Absolute: 0.1 10*3/uL (ref 0.0–0.5)
Eosinophils Relative: 3 %
HCT: 34 % — ABNORMAL LOW (ref 39.0–52.0)
Hemoglobin: 11.6 g/dL — ABNORMAL LOW (ref 13.0–17.0)
Immature Granulocytes: 0 %
Lymphocytes Relative: 24 %
Lymphs Abs: 1.1 10*3/uL (ref 0.7–4.0)
MCH: 35.7 pg — ABNORMAL HIGH (ref 26.0–34.0)
MCHC: 34.1 g/dL (ref 30.0–36.0)
MCV: 104.6 fL — ABNORMAL HIGH (ref 80.0–100.0)
Monocytes Absolute: 0.4 10*3/uL (ref 0.1–1.0)
Monocytes Relative: 8 %
Neutro Abs: 3 10*3/uL (ref 1.7–7.7)
Neutrophils Relative %: 64 %
Platelet Count: 216 10*3/uL (ref 150–400)
RBC: 3.25 MIL/uL — ABNORMAL LOW (ref 4.22–5.81)
RDW: 13 % (ref 11.5–15.5)
WBC Count: 4.6 10*3/uL (ref 4.0–10.5)
nRBC: 0 % (ref 0.0–0.2)

## 2022-03-05 LAB — CMP (CANCER CENTER ONLY)
ALT: 9 U/L (ref 0–44)
AST: 15 U/L (ref 15–41)
Albumin: 4.3 g/dL (ref 3.5–5.0)
Alkaline Phosphatase: 36 U/L — ABNORMAL LOW (ref 38–126)
Anion gap: 7 (ref 5–15)
BUN: <5 mg/dL — ABNORMAL LOW (ref 8–23)
CO2: 25 mmol/L (ref 22–32)
Calcium: 9.8 mg/dL (ref 8.9–10.3)
Chloride: 108 mmol/L (ref 98–111)
Creatinine: <0.3 mg/dL — ABNORMAL LOW (ref 0.61–1.24)
Glucose, Bld: 105 mg/dL — ABNORMAL HIGH (ref 70–99)
Potassium: 4.3 mmol/L (ref 3.5–5.1)
Sodium: 140 mmol/L (ref 135–145)
Total Bilirubin: 0.2 mg/dL — ABNORMAL LOW (ref 0.3–1.2)
Total Protein: 5.4 g/dL — ABNORMAL LOW (ref 6.5–8.1)

## 2022-03-05 MED ORDER — ACETAMINOPHEN 500 MG PO TABS
1000.0000 mg | ORAL_TABLET | Freq: Once | ORAL | Status: AC
  Administered 2022-03-05: 1000 mg via ORAL

## 2022-03-05 MED ORDER — SODIUM CHLORIDE 0.9 % IV SOLN: Freq: Once | INTRAVENOUS | Status: DC

## 2022-03-05 MED ORDER — PROCHLORPERAZINE MALEATE 10 MG PO TABS
10.0000 mg | ORAL_TABLET | Freq: Once | ORAL | Status: AC
  Administered 2022-03-05: 10 mg via ORAL

## 2022-03-05 MED ORDER — ACETAMINOPHEN 500 MG PO TABS
ORAL_TABLET | ORAL | Status: AC
  Filled 2022-03-05: qty 2

## 2022-03-05 MED ORDER — SODIUM CHLORIDE 0.9% FLUSH
10.0000 mL | Freq: Once | INTRAVENOUS | Status: AC
  Administered 2022-03-05: 10 mL

## 2022-03-05 MED ORDER — SODIUM CHLORIDE 0.9% FLUSH
10.0000 mL | INTRAVENOUS | Status: DC | PRN
  Administered 2022-03-05: 10 mL

## 2022-03-05 MED ORDER — HEPARIN SOD (PORK) LOCK FLUSH 100 UNIT/ML IV SOLN
500.0000 [IU] | Freq: Once | INTRAVENOUS | Status: AC | PRN
  Administered 2022-03-05: 500 [IU]

## 2022-03-05 MED ORDER — SODIUM CHLORIDE 0.9 % IV SOLN: Freq: Once | INTRAVENOUS | Status: AC

## 2022-03-05 MED ORDER — DEXTROSE 5 % IV SOLN
56.0000 mg/m2 | Freq: Once | INTRAVENOUS | Status: AC
  Administered 2022-03-05: 120 mg via INTRAVENOUS
  Filled 2022-03-05: qty 60

## 2022-03-05 MED ORDER — PROCHLORPERAZINE MALEATE 10 MG PO TABS
ORAL_TABLET | ORAL | Status: AC
  Administered 2022-03-05: 10 mg
  Filled 2022-03-05: qty 1

## 2022-03-05 MED ORDER — SODIUM CHLORIDE 0.9 % IV SOLN
10.0000 mg | Freq: Once | INTRAVENOUS | Status: AC
  Administered 2022-03-05: 10 mg via INTRAVENOUS
  Filled 2022-03-05: qty 10

## 2022-03-05 NOTE — Patient Instructions (Addendum)
Tetanus immunization update given.  Bactroban ointment to be applied twice daily after cleaning wound with warm soapy water and cleaning with peroxide.  Wound is to be kept covered and recheck in 48 hours here.  Take Augmentin 500 mg twice daily for 5 days.

## 2022-03-05 NOTE — Patient Instructions (Signed)
Finish course of antibiotics and continue dressing and cleaning the wound with peroxide and applying Bactroban ointment sparingly once or twice daily.  Call if the wound is not doing well.  Referral to ENT regarding cerumen removal.  Tetanus immunization given at last visit.

## 2022-03-05 NOTE — Progress Notes (Addendum)
   Subjective:    Patient ID: [name]., male    DOB: 1946/07/15, 76 y.o.   MRN: [id number]  HPI 76 year old Male with history of multiple myeloma status post stem cell transplant followed by Dr. Irene Limbo seen today with dog bite right lateral leg.  Patient was considering adopting a dog that he saw offered on a bulletin board at a grocery store.  He went to look at the dog and unfortunately the dog unexpectedly bit him on the right lateral leg.  Current caretakers of the dog assured him the dog had rabies vaccine.  However the animal was impounded by [employer] for 10 days.  He is here now for evaluation and treatment.  This happened 2 days ago on August 5.  Patient was concerned that he might need stitches.  His wife who is a nurse has been dressing and bandaging the wound in a sterile fashion for the past couple of days.  Explained to the patient that it would be unwise to stitch up a dog bite due to the fact that it would likely be infected and the stitches would not hold.  The wound is also is over 24 hours old.  His tetanus immunization does need to be updated and this was done today.    Review of Systems he had no fever or chills and no drainage from the wound.     Objective:   Physical Exam Vital signs are reviewed and he is afebrile.  Tetanus immunization update was given today. He has a V-shaped wound right lateral leg as well as a couple of other puncture wounds from dog biting him.  There is no drainage from these wounds.  His wife has done an excellent job taking care of these wounds over the weekend.      Assessment & Plan:  V-shaped dog bite laceration and other puncture wounds right lateral leg from dog bites.  Dog is impounded.  Reportedly dog has up-to-date Rabies immunizations.  Patient was given tetanus update today.  His leg was cleaned well and dressed in a sterile fashion and treated with Bactroban ointment.  He was prescribed Bactroban ointment to use at home.  He  is to clean his wound twice daily with peroxide apply Bactroban ointment sparingly and wrapped the wound and I will recheck on August 11 or sooner if worse.  He will be on Augmentin 500 mg twice daily for 5 days.
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
## 2022-03-05 NOTE — Patient Instructions (Signed)
[facility] MEDICAL ONCOLOGY  Discharge Instructions: Thank you for choosing [facility] to provide your oncology and hematology care.   If you have a lab appointment with the Cancer Center, please go directly to the Cancer Center and check in at the registration area.   Wear comfortable clothing and clothing appropriate for easy access to any Portacath or PICC line.   We strive to give you quality time with your provider. You may need to reschedule your appointment if you arrive late (15 or more minutes).  Arriving late affects you and other patients whose appointments are after yours.  Also, if you miss three or more appointments without notifying the office, you may be dismissed from the clinic at the provider's discretion.      For prescription refill requests, have your pharmacy contact our office and allow 72 hours for refills to be completed.    Today you received the following chemotherapy and/or immunotherapy agents: Kyprolis.       To help prevent nausea and vomiting after your treatment, we encourage you to take your nausea medication as directed.  BELOW ARE SYMPTOMS THAT SHOULD BE REPORTED IMMEDIATELY: *FEVER GREATER THAN 100.4 F (38 C) OR HIGHER *CHILLS OR SWEATING *NAUSEA AND VOMITING THAT IS NOT CONTROLLED WITH YOUR NAUSEA MEDICATION *UNUSUAL SHORTNESS OF BREATH *UNUSUAL BRUISING OR BLEEDING *URINARY PROBLEMS (pain or burning when urinating, or frequent urination) *BOWEL PROBLEMS (unusual diarrhea, constipation, pain near the anus) TENDERNESS IN MOUTH AND THROAT WITH OR WITHOUT PRESENCE OF ULCERS (sore throat, sores in mouth, or a toothache) UNUSUAL RASH, SWELLING OR PAIN  UNUSUAL VAGINAL DISCHARGE OR ITCHING   Items with * indicate a potential emergency and should be followed up as soon as possible or go to the Emergency Department if any problems should occur.  Please show the CHEMOTHERAPY ALERT CARD or IMMUNOTHERAPY ALERT CARD at check-in to  the Emergency Department and triage nurse.  Should you have questions after your visit or need to cancel or reschedule your appointment, please contact [facility]ICAL [facility]COL[facility]  Dept: [phone]  and follow the prompts.  Office hours are 8:00 a.m. to 4:30 p.m. Monday - Friday. Please note that voicemails left after 4:00 p.m. may not be returned until the following business day.  We are closed weekends and major holidays. You have access to a nurse at all times for urgent questions. Please call the main number to the clinic Dept: [phone] and follow the prompts.   For any non-urgent questions, you may also contact your provider using MyChart. We now offer e-Visits for anyone 18 and older to request care online for non-urgent symptoms. For details visit [email or web address].   Also download the MyChart app! Go to the app store, search "MyChart", open the app, select [facility], and log in with your MyChart username and password.  Masks are optional in the cancer centers. If you would like for your care team to wear a mask while they are taking care of you, please let them know. You may have one support person who is at least 75 years old accompany you for your appointments. 

## 2022-03-06 ENCOUNTER — Telehealth: Payer: Self-pay | Admitting: Neurology

## 2022-03-06 NOTE — Telephone Encounter (Signed)
Pt is calling to see if nurse have talked to Dr. Krista Blue about doing a nerve conduction test him. Pt is requesting a return call from the nurse.

## 2022-03-07 LAB — MULTIPLE MYELOMA PANEL, SERUM
Albumin SerPl Elph-Mcnc: 3.4 g/dL (ref 2.9–4.4)
Albumin/Glob SerPl: 1.8 — ABNORMAL HIGH (ref 0.7–1.7)
Alpha 1: 0.3 g/dL (ref 0.0–0.4)
Alpha2 Glob SerPl Elph-Mcnc: 0.7 g/dL (ref 0.4–1.0)
B-Globulin SerPl Elph-Mcnc: 0.9 g/dL (ref 0.7–1.3)
Gamma Glob SerPl Elph-Mcnc: 0.2 g/dL — ABNORMAL LOW (ref 0.4–1.8)
Globulin, Total: 2 g/dL — ABNORMAL LOW (ref 2.2–3.9)
IgA: 18 mg/dL — ABNORMAL LOW (ref 61–437)
IgG (Immunoglobin G), Serum: 189 mg/dL — ABNORMAL LOW (ref 603–1613)
IgM (Immunoglobulin M), Srm: 6 mg/dL — ABNORMAL LOW (ref 15–143)
Total Protein ELP: 5.4 g/dL — ABNORMAL LOW (ref 6.0–8.5)

## 2022-03-11 ENCOUNTER — Telehealth: Payer: Self-pay | Admitting: Internal Medicine

## 2022-03-11 NOTE — Telephone Encounter (Signed)
[name] called to change her appointment tomorrow, she stated that [name] dog bite was a little red and swollen, should he wait till tomorrow or come in today. I scheduled him for 12:00 tomorrow.

## 2022-03-12 ENCOUNTER — Ambulatory Visit (INDEPENDENT_AMBULATORY_CARE_PROVIDER_SITE_OTHER): Payer: Medicare Other | Admitting: Internal Medicine

## 2022-03-12 ENCOUNTER — Encounter: Payer: Self-pay | Admitting: Internal Medicine

## 2022-03-12 VITALS — BP 124/76 | HR 61 | Temp 97.9°F | Wt 193.8 lb

## 2022-03-12 DIAGNOSIS — Z8579 Personal history of other malignant neoplasms of lymphoid, hematopoietic and related tissues: Secondary | ICD-10-CM

## 2022-03-12 DIAGNOSIS — S81851D Open bite, right lower leg, subsequent encounter: Secondary | ICD-10-CM | POA: Diagnosis not present

## 2022-03-12 DIAGNOSIS — W540XXD Bitten by dog, subsequent encounter: Secondary | ICD-10-CM | POA: Diagnosis not present

## 2022-03-12 DIAGNOSIS — Z9484 Stem cells transplant status: Secondary | ICD-10-CM

## 2022-03-12 MED ORDER — AMOXICILLIN-POT CLAVULANATE 500-125 MG PO TABS: 1.0000 | ORAL_TABLET | Freq: Three times a day (TID) | ORAL | 0 refills | Status: DC

## 2022-03-13 ENCOUNTER — Other Ambulatory Visit: Payer: Self-pay

## 2022-03-13 DIAGNOSIS — C9000 Multiple myeloma not having achieved remission: Secondary | ICD-10-CM

## 2022-03-13 MED ORDER — ACYCLOVIR 800 MG PO TABS: 800.0000 mg | ORAL_TABLET | Freq: Two times a day (BID) | ORAL | 2 refills | Status: DC

## 2022-03-13 MED ORDER — GABAPENTIN 300 MG PO CAPS: 300.0000 mg | ORAL_CAPSULE | Freq: Two times a day (BID) | ORAL | 1 refills | Status: DC

## 2022-03-14 ENCOUNTER — Ambulatory Visit: Payer: Medicare Other | Admitting: Internal Medicine

## 2022-03-15 ENCOUNTER — Other Ambulatory Visit: Payer: Self-pay

## 2022-03-15 DIAGNOSIS — C9000 Multiple myeloma not having achieved remission: Secondary | ICD-10-CM

## 2022-03-17 NOTE — Progress Notes (Signed)
   Subjective:    Patient ID: [name]., male    DOB: [birth year], 76 y.o.   MRN: [id number]  HPI This 76 year old Male is seen today for follow-up again on dog bite of right leg.  His initial visit for this problem was on  August 7 after the bite had occurred on Saturday, August 5.  He went to see about adopting a dog at an individual's home, and the dog unexpectedly  bit him on the right lateral leg.  Caretakers have assured him the dog had received rabies vaccines in advance of the incident.  Animal was impounded by animal control for 10 days.  On his initial visit he was given a tetanus update.  He had a V- shaped wound right lateral leg as well as a couple of other puncture wounds from the dog biting him.  He was placed on Augmentin 500 mg twice daily for 7 days and received prescription for Bactroban ointment to apply topically.  He was rechecked on August 11.  His wife who is a nurse took care of the cleansing of the wound and topical application of antibiotic and did an excellent job.  On August 11, it was noted he had no fever or chills.  Wound on right leg at that time was clean and did not appear to be secondarily infected.  He had other puncture wounds that were clean on the right lateral leg as well.  He was advised to continue dressing the wound with Bactroban ointment and clean with peroxide twice daily until wound was healed.  He was given a tetanus immunization update on August 11.  Patient has history of multiple myeloma followed by Dr. Irene Limbo and he receives carfilzomib every 2 weeks.  History of stem cell transplant for multiple myeloma at [facility] in September 2021.  In July 2021 he had left lower leg DVT treated with Eliquis.    Review of Systems see above no fever, chills, nausea, vomiting.       Objective:   Physical Exam  The bite wounds on right lateral leg previously described in initial note are healing well but patient is concerned as they  are still tender.  They are not draining and they are not erythematous.  They are tender however.  These are cleaned with peroxide and Bactroban ointment applied and these are rewrapped today.  The V shaped lesion seems to be healing.  Other puncture wounds are not draining.  He is afebrile.      Assessment & Plan:  V-shaped laceration from dog bite and multiple puncture wounds right lateral leg from dog bite.  Plan: At this time he will be prescribed Augmentin 500/125 3 times a day for 5 days.  I think he will not need further antibiotics after this prescription.  I do think that the wounds will be sore and slow to heal.  He will call if he has any other questions or concerns.  If he has drainage, recurrent redness of these wounds he is to get in touch with me immediately.  His tetanus update was given at last visit.

## 2022-03-17 NOTE — Patient Instructions (Signed)
Take Augmentin 500/125 tablets 3 times daily for 5 days.  After that I do not think you will need further antibiotics as everything seems to be healing.  Call if you have recurrent redness of the wounds or significant drainage.  Continue cleaning these wounds with peroxide and applying Bactroban ointment until they are healed.

## 2022-03-18 MED FILL — Dexamethasone Sodium Phosphate Inj 100 MG/10ML: INTRAMUSCULAR | Qty: 1 | Status: AC

## 2022-03-19 ENCOUNTER — Inpatient Hospital Stay: Payer: Medicare Other

## 2022-03-19 ENCOUNTER — Other Ambulatory Visit: Payer: Medicare Other

## 2022-03-19 ENCOUNTER — Ambulatory Visit: Payer: Medicare Other

## 2022-03-19 ENCOUNTER — Other Ambulatory Visit: Payer: Self-pay

## 2022-03-19 ENCOUNTER — Inpatient Hospital Stay (HOSPITAL_BASED_OUTPATIENT_CLINIC_OR_DEPARTMENT_OTHER): Payer: Medicare Other | Admitting: Hematology

## 2022-03-19 VITALS — BP 117/71 | HR 62 | Temp 97.9°F | Resp 15 | Wt 193.9 lb

## 2022-03-19 DIAGNOSIS — Z95828 Presence of other vascular implants and grafts: Secondary | ICD-10-CM | POA: Diagnosis not present

## 2022-03-19 DIAGNOSIS — C9000 Multiple myeloma not having achieved remission: Secondary | ICD-10-CM

## 2022-03-19 DIAGNOSIS — Z7189 Other specified counseling: Secondary | ICD-10-CM

## 2022-03-19 DIAGNOSIS — Z5112 Encounter for antineoplastic immunotherapy: Secondary | ICD-10-CM | POA: Diagnosis not present

## 2022-03-19 LAB — CMP (CANCER CENTER ONLY)
ALT: 13 U/L (ref 0–44)
AST: 22 U/L (ref 15–41)
Albumin: 4 g/dL (ref 3.5–5.0)
Alkaline Phosphatase: 55 U/L (ref 38–126)
Anion gap: 5 (ref 5–15)
BUN: 19 mg/dL (ref 8–23)
CO2: 28 mmol/L (ref 22–32)
Calcium: 9.4 mg/dL (ref 8.9–10.3)
Chloride: 106 mmol/L (ref 98–111)
Creatinine: 1.44 mg/dL — ABNORMAL HIGH (ref 0.61–1.24)
GFR, Estimated: 51 mL/min — ABNORMAL LOW (ref >60)
Glucose, Bld: 120 mg/dL — ABNORMAL HIGH (ref 70–99)
Potassium: 4 mmol/L (ref 3.5–5.1)
Sodium: 139 mmol/L (ref 135–145)
Total Bilirubin: 0.5 mg/dL (ref 0.3–1.2)
Total Protein: 5.6 g/dL — ABNORMAL LOW (ref 6.5–8.1)

## 2022-03-19 LAB — CBC WITH DIFFERENTIAL (CANCER CENTER ONLY)
Abs Immature Granulocytes: 0.01 10*3/uL (ref 0.00–0.07)
Basophils Absolute: 0 10*3/uL (ref 0.0–0.1)
Basophils Relative: 1 %
Eosinophils Absolute: 0.2 10*3/uL (ref 0.0–0.5)
Eosinophils Relative: 4 %
HCT: 33.4 % — ABNORMAL LOW (ref 39.0–52.0)
Hemoglobin: 11.5 g/dL — ABNORMAL LOW (ref 13.0–17.0)
Immature Granulocytes: 0 %
Lymphocytes Relative: 31 %
Lymphs Abs: 1.3 10*3/uL (ref 0.7–4.0)
MCH: 36.4 pg — ABNORMAL HIGH (ref 26.0–34.0)
MCHC: 34.4 g/dL (ref 30.0–36.0)
MCV: 105.7 fL — ABNORMAL HIGH (ref 80.0–100.0)
Monocytes Absolute: 0.4 10*3/uL (ref 0.1–1.0)
Monocytes Relative: 8 %
Neutro Abs: 2.3 10*3/uL (ref 1.7–7.7)
Neutrophils Relative %: 56 %
Platelet Count: 154 10*3/uL (ref 150–400)
RBC: 3.16 MIL/uL — ABNORMAL LOW (ref 4.22–5.81)
RDW: 13.2 % (ref 11.5–15.5)
WBC Count: 4.2 10*3/uL (ref 4.0–10.5)
nRBC: 0 % (ref 0.0–0.2)

## 2022-03-19 MED ORDER — ACETAMINOPHEN 500 MG PO TABS
1000.0000 mg | ORAL_TABLET | Freq: Once | ORAL | Status: AC
  Administered 2022-03-19: 1000 mg via ORAL
  Filled 2022-03-19: qty 2

## 2022-03-19 MED ORDER — SODIUM CHLORIDE 0.9 % IV SOLN: Freq: Once | INTRAVENOUS | Status: AC

## 2022-03-19 MED ORDER — SODIUM CHLORIDE 0.9 % IV SOLN
10.0000 mg | Freq: Once | INTRAVENOUS | Status: AC
  Administered 2022-03-19: 10 mg via INTRAVENOUS
  Filled 2022-03-19: qty 10

## 2022-03-19 MED ORDER — DEXTROSE 5 % IV SOLN
56.0000 mg/m2 | Freq: Once | INTRAVENOUS | Status: AC
  Administered 2022-03-19: 120 mg via INTRAVENOUS
  Filled 2022-03-19: qty 60

## 2022-03-19 MED ORDER — PROCHLORPERAZINE MALEATE 10 MG PO TABS
10.0000 mg | ORAL_TABLET | Freq: Once | ORAL | Status: AC
  Administered 2022-03-19: 10 mg via ORAL
  Filled 2022-03-19: qty 1

## 2022-03-19 NOTE — Patient Instructions (Signed)
[facility] MEDICAL ONCOLOGY  Discharge Instructions: Thank you for choosing [facility] to provide your oncology and hematology care.   If you have a lab appointment with the Cancer Center, please go directly to the Cancer Center and check in at the registration area.   Wear comfortable clothing and clothing appropriate for easy access to any Portacath or PICC line.   We strive to give you quality time with your provider. You may need to reschedule your appointment if you arrive late (15 or more minutes).  Arriving late affects you and other patients whose appointments are after yours.  Also, if you miss three or more appointments without notifying the office, you may be dismissed from the clinic at the provider's discretion.      For prescription refill requests, have your pharmacy contact our office and allow 72 hours for refills to be completed.    Today you received the following chemotherapy and/or immunotherapy agents: Kyprolis.       To help prevent nausea and vomiting after your treatment, we encourage you to take your nausea medication as directed.  BELOW ARE SYMPTOMS THAT SHOULD BE REPORTED IMMEDIATELY: *FEVER GREATER THAN 100.4 F (38 C) OR HIGHER *CHILLS OR SWEATING *NAUSEA AND VOMITING THAT IS NOT CONTROLLED WITH YOUR NAUSEA MEDICATION *UNUSUAL SHORTNESS OF BREATH *UNUSUAL BRUISING OR BLEEDING *URINARY PROBLEMS (pain or burning when urinating, or frequent urination) *BOWEL PROBLEMS (unusual diarrhea, constipation, pain near the anus) TENDERNESS IN MOUTH AND THROAT WITH OR WITHOUT PRESENCE OF ULCERS (sore throat, sores in mouth, or a toothache) UNUSUAL RASH, SWELLING OR PAIN  UNUSUAL VAGINAL DISCHARGE OR ITCHING   Items with * indicate a potential emergency and should be followed up as soon as possible or go to the Emergency Department if any problems should occur.  Please show the CHEMOTHERAPY ALERT CARD or IMMUNOTHERAPY ALERT CARD at check-in to  the Emergency Department and triage nurse.  Should you have questions after your visit or need to cancel or reschedule your appointment, please contact [facility]ICAL [facility]COL[facility]  Dept: [phone]  and follow the prompts.  Office hours are 8:00 a.m. to 4:30 p.m. Monday - Friday. Please note that voicemails left after 4:00 p.m. may not be returned until the following business day.  We are closed weekends and major holidays. You have access to a nurse at all times for urgent questions. Please call the main number to the clinic Dept: [phone] and follow the prompts.   For any non-urgent questions, you may also contact your provider using MyChart. We now offer e-Visits for anyone 18 and older to request care online for non-urgent symptoms. For details visit [email or web address].   Also download the MyChart app! Go to the app store, search "MyChart", open the app, select [facility], and log in with your MyChart username and password.  Masks are optional in the cancer centers. If you would like for your care team to wear a mask while they are taking care of you, please let them know. You may have one support person who is at least 75 years old accompany you for your appointments. 

## 2022-03-19 NOTE — Patient Instructions (Signed)
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
## 2022-03-20 ENCOUNTER — Inpatient Hospital Stay: Payer: Medicare Other

## 2022-03-20 VITALS — BP 107/65 | HR 75 | Temp 97.9°F | Resp 16

## 2022-03-20 DIAGNOSIS — Z5112 Encounter for antineoplastic immunotherapy: Secondary | ICD-10-CM | POA: Diagnosis not present

## 2022-03-20 DIAGNOSIS — Z95828 Presence of other vascular implants and grafts: Secondary | ICD-10-CM

## 2022-03-20 DIAGNOSIS — C9000 Multiple myeloma not having achieved remission: Secondary | ICD-10-CM

## 2022-03-20 MED ORDER — SODIUM CHLORIDE 0.9 % IV SOLN: Freq: Once | INTRAVENOUS | Status: AC

## 2022-03-20 MED ORDER — HEPARIN SOD (PORK) LOCK FLUSH 100 UNIT/ML IV SOLN
500.0000 [IU] | Freq: Once | INTRAVENOUS | Status: AC | PRN
  Administered 2022-03-20: 500 [IU]

## 2022-03-20 MED ORDER — SODIUM CHLORIDE 0.9 % IV SOLN
60.0000 mg | Freq: Once | INTRAVENOUS | Status: AC
  Administered 2022-03-20: 60 mg via INTRAVENOUS
  Filled 2022-03-20: qty 20

## 2022-03-20 MED ORDER — SODIUM CHLORIDE 0.9% FLUSH
10.0000 mL | INTRAVENOUS | Status: DC | PRN
  Administered 2022-03-20: 10 mL via INTRAVENOUS

## 2022-03-20 NOTE — Patient Instructions (Signed)
Pamidronate Injection What is this medication? PAMIDRONATE (pa mi DROE nate) treats high calcium levels in the blood caused by cancer. It may also be used with chemotherapy to treat weakened bones caused by cancer. It can also be used to treat Paget's disease of the bone. It works by slowing down the release of calcium from bones. This lowers calcium levels in your blood. It also makes your bones stronger and less likely to break (fracture). It belongs to a group of medications called bisphosphonates. This medicine may be used for other purposes; ask your health care provider or pharmacist if you have questions. COMMON BRAND NAME(S): Aredia What should I tell my care team before I take this medication? They need to know if you have any of these conditions: Bleeding disorder Cancer Dental disease Kidney disease Low levels of calcium or other minerals in the blood Low red blood cell counts Receiving steroids, such as dexamethasone or prednisone An unusual or allergic reaction to pamidronate, other medications, foods, dyes or preservatives Pregnant or trying to get pregnant Breast-feeding How should I use this medication? This medication is injected into a vein. It is given by your care team in a hospital or clinic setting. Talk to your care team about the use of this medication in children. Special care may be needed. Overdosage: If you think you have taken too much of this medicine contact a poison control center or emergency room at once. NOTE: This medicine is only for you. Do not share this medicine with others. What if I miss a dose? Keep appointments for follow-up doses. It is important not to miss your dose. Call your care team if you are unable to keep an appointment. What may interact with this medication? Certain antibiotics given by injection Medications for inflammation or pain, such as ibuprofen, naproxen Some diuretics, such as bumetanide, furosemide Cyclosporine Parathyroid  hormone Tacrolimus Teriparatide Thalidomide This list may not describe all possible interactions. Give your health care provider a list of all the medicines, herbs, non-prescription drugs, or dietary supplements you use. Also tell them if you smoke, drink alcohol, or use illegal drugs. Some items may interact with your medicine. What should I watch for while using this medication? Visit your care team for regular checks on your progress. It may be some time before you see the benefit from this medication. Some people who take this medication have severe bone, joint, or muscle pain. This medication may also increase your risk for jaw problems or a broken thigh bone. Tell your care team right away if you have severe pain in your jaw, bones, joints, or muscles. Tell your care team if you have any pain that does not go away or that gets worse. Tell your dentist and dental surgeon that you are taking this medication. You should not have major dental surgery while on this medication. See your dentist to have a dental exam and fix any dental problems before starting this medication. Take good care of your teeth while on this medication. Make sure you see your dentist for regular follow-up appointments. You should make sure you get enough calcium and vitamin D while you are taking this medication. Discuss the foods you eat and the vitamins you take with your care team. You may need bloodwork while you are taking this medication. Talk to your care team if you wish to become pregnant or think you might be pregnant. This medication can cause serious birth defects. What side effects may I notice from receiving   this medication? Side effects that you should report to your care team as soon as possible: Allergic reactions--skin rash, itching, hives, swelling of the face, lips, tongue, or throat Kidney injury--decrease in the amount of urine, swelling of the ankles, hands, or feet Low calcium level--muscle pain or  cramps, confusion, tingling, or numbness in the hands or feet Osteonecrosis of the jaw--pain, swelling, or redness in the mouth, numbness of the jaw, poor healing after dental work, unusual discharge from the mouth, visible bones in the mouth Severe bone, joint, or muscle pain Side effects that usually do not require medical attention (report to your care team if they continue or are bothersome): Constipation Fatigue Fever Loss of appetite Nausea Pain, redness, or irritation at injection site Stomach pain This list may not describe all possible side effects. Call your doctor for medical advice about side effects. You may report side effects to FDA at 1-800-FDA-1088. Where should I keep my medication? This medication is given in a hospital or clinic. It will not be stored at home. NOTE: This sheet is a summary. It may not cover all possible information. If you have questions about this medicine, talk to your doctor, pharmacist, or health care provider.  2023 Elsevier/Gold Standard (2021-08-27 00:00:00) 

## 2022-03-21 ENCOUNTER — Encounter: Payer: Self-pay | Admitting: Internal Medicine

## 2022-03-21 ENCOUNTER — Telehealth: Payer: Self-pay | Admitting: Internal Medicine

## 2022-03-21 MED ORDER — MUPIROCIN 2 % EX OINT: 1.0000 | TOPICAL_OINTMENT | Freq: Two times a day (BID) | CUTANEOUS | 0 refills | Status: DC

## 2022-03-21 NOTE — Telephone Encounter (Signed)
Dalante Minus [phone]  [name] called to ask if he should keep chamnging his wound bandage twice a day or go to once a day, it still looks about the same, and he also said he is about out of the cream he is putting on it, so he wanted to see if you could send some more into the pharmacy.

## 2022-03-21 NOTE — Telephone Encounter (Signed)
Have sent in refill on Mupirocin ointment. We probably should look at wound again next week latter part of week. MJB, MD

## 2022-03-22 ENCOUNTER — Other Ambulatory Visit: Payer: Self-pay | Admitting: Physician Assistant

## 2022-03-22 ENCOUNTER — Encounter: Payer: Self-pay | Admitting: Hematology

## 2022-03-22 DIAGNOSIS — C9001 Multiple myeloma in remission: Secondary | ICD-10-CM

## 2022-03-22 MED ORDER — TRAZODONE HCL 50 MG PO TABS: 50.0000 mg | ORAL_TABLET | Freq: Every evening | ORAL | 1 refills | Status: DC | PRN

## 2022-03-22 MED ORDER — TRAMADOL HCL 50 MG PO TABS: 50.0000 mg | ORAL_TABLET | Freq: Four times a day (QID) | ORAL | 0 refills | Status: DC | PRN

## 2022-03-26 NOTE — Telephone Encounter (Signed)
scheduled

## 2022-03-28 ENCOUNTER — Ambulatory Visit: Payer: Medicare Other | Admitting: Neurology

## 2022-03-28 ENCOUNTER — Telehealth: Payer: Self-pay | Admitting: Neurology

## 2022-03-28 VITALS — BP 133/93 | HR 78 | Ht 70.0 in | Wt 196.2 lb

## 2022-03-28 DIAGNOSIS — M545 Low back pain, unspecified: Secondary | ICD-10-CM | POA: Diagnosis not present

## 2022-03-28 DIAGNOSIS — M542 Cervicalgia: Secondary | ICD-10-CM | POA: Diagnosis not present

## 2022-03-28 DIAGNOSIS — G8929 Other chronic pain: Secondary | ICD-10-CM

## 2022-03-28 DIAGNOSIS — R202 Paresthesia of skin: Secondary | ICD-10-CM | POA: Diagnosis not present

## 2022-03-28 DIAGNOSIS — R269 Unspecified abnormalities of gait and mobility: Secondary | ICD-10-CM

## 2022-03-28 NOTE — Telephone Encounter (Signed)
[employer] medicare NPR sent to GI 
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
## 2022-03-29 ENCOUNTER — Other Ambulatory Visit: Payer: Self-pay | Admitting: *Deleted

## 2022-03-29 ENCOUNTER — Encounter: Payer: Self-pay | Admitting: Internal Medicine

## 2022-03-29 ENCOUNTER — Ambulatory Visit (INDEPENDENT_AMBULATORY_CARE_PROVIDER_SITE_OTHER): Payer: Medicare Other | Admitting: Internal Medicine

## 2022-03-29 ENCOUNTER — Encounter: Payer: Self-pay | Admitting: Neurology

## 2022-03-29 VITALS — BP 110/70 | HR 65 | Temp 98.2°F | Ht 70.0 in | Wt 196.0 lb

## 2022-03-29 DIAGNOSIS — G620 Drug-induced polyneuropathy: Secondary | ICD-10-CM

## 2022-03-29 DIAGNOSIS — W540XXD Bitten by dog, subsequent encounter: Secondary | ICD-10-CM

## 2022-03-29 DIAGNOSIS — Z8579 Personal history of other malignant neoplasms of lymphoid, hematopoietic and related tissues: Secondary | ICD-10-CM | POA: Diagnosis not present

## 2022-03-29 DIAGNOSIS — S81851D Open bite, right lower leg, subsequent encounter: Secondary | ICD-10-CM

## 2022-03-29 DIAGNOSIS — C9001 Multiple myeloma in remission: Secondary | ICD-10-CM

## 2022-03-29 DIAGNOSIS — Z9484 Stem cells transplant status: Secondary | ICD-10-CM

## 2022-03-29 NOTE — Patient Instructions (Signed)
The right lateral leg wound is clean and dry and beginning to fill in with new tissue. May take a few more weeks to completely heal. Does not have to keep covered at all times now. Should be filled in within another month, I think. Return as needed.

## 2022-03-29 NOTE — Progress Notes (Signed)
   Subjective:    Patient ID: [name]., male    DOB: Sep 09, 1945, 76 y.o.   MRN: [id number]  HPI 76 year old Male seen today for follow up dog bite right lateral leg. It is beginning to heal with new tissue filling in the lacerated areas. He has no pain, fever or chills. He keeps it covered when showering. Wife who is a Marine scientist has done an excellent job taking care of the wound.  He received Tdap August 2023.  He saw Neurologist yesterday for evaluation of paresthesias  and unsteady gait.  Possible etiologies for paresthesias and unsteady gait as well as back and neck pain are lumbar radiculopathy, cervical spondylitic myelopathy, chemotherapy for multiple myeloma and degenerative disc disease of cervical and lumbar spine.  Patient is to have MRI of cervical and lumbar spine and EMG nerve conduction studies.  No bowel or bladder incontinence.  Had stem cell transplant for multiple myeloma September 2021 and is now in remission.  Continues to receive carfilzomib every 2 weeks is on chronic anticoagulation and gets Aredia maintenance every 12 weeks.     Review of Systems see above- no pain, no drainage from the wound     Objective:   Physical Exam  Blood pressure 110/70 pulse 65 temperature 98.2 degrees pulse oximetry 98% weight 196 pounds height 5 feet 10 inches BMI 28.12  Bowtie shaped laceration right lateral leg is beginning to heal nicely.  There is no evidence of secondary infection.  There is no erythema or drainage from the wound.  Wound is beginning to fill in from the outside lacerated wound area towards the inside.  New skin is noted in those areas.      Assessment & Plan:  Healing wound from dog bite right lateral leg.  No need to continue to keep this wound covered continuously.  Able I believe Filion within the next 3 to 4 weeks completely.  Patient will days daily and keep the wound clean and dry.  Return as needed.  Being evaluated by neurologist for peripheral  neuropathy and gait unsteadiness.  Is to have nerve conduction studies and MRI of the C-spine and lumbar spine.

## 2022-04-01 MED FILL — Dexamethasone Sodium Phosphate Inj 100 MG/10ML: INTRAMUSCULAR | Qty: 1 | Status: AC

## 2022-04-02 ENCOUNTER — Inpatient Hospital Stay: Payer: Medicare Other

## 2022-04-02 ENCOUNTER — Other Ambulatory Visit: Payer: Self-pay

## 2022-04-02 ENCOUNTER — Inpatient Hospital Stay: Payer: Medicare Other | Attending: Hematology

## 2022-04-02 VITALS — BP 115/77 | HR 65 | Temp 97.8°F | Resp 14 | Wt 196.5 lb

## 2022-04-02 DIAGNOSIS — C9001 Multiple myeloma in remission: Secondary | ICD-10-CM

## 2022-04-02 DIAGNOSIS — Z7189 Other specified counseling: Secondary | ICD-10-CM

## 2022-04-02 DIAGNOSIS — Z79899 Other long term (current) drug therapy: Secondary | ICD-10-CM | POA: Insufficient documentation

## 2022-04-02 DIAGNOSIS — Z95828 Presence of other vascular implants and grafts: Secondary | ICD-10-CM

## 2022-04-02 DIAGNOSIS — C9000 Multiple myeloma not having achieved remission: Secondary | ICD-10-CM | POA: Diagnosis not present

## 2022-04-02 DIAGNOSIS — Z5112 Encounter for antineoplastic immunotherapy: Secondary | ICD-10-CM | POA: Insufficient documentation

## 2022-04-02 LAB — CMP (CANCER CENTER ONLY)
ALT: 14 U/L (ref 0–44)
AST: 21 U/L (ref 15–41)
Albumin: 4.1 g/dL (ref 3.5–5.0)
Alkaline Phosphatase: 61 U/L (ref 38–126)
Anion gap: 5 (ref 5–15)
BUN: 19 mg/dL (ref 8–23)
CO2: 28 mmol/L (ref 22–32)
Calcium: 9.8 mg/dL (ref 8.9–10.3)
Chloride: 107 mmol/L (ref 98–111)
Creatinine: 1.45 mg/dL — ABNORMAL HIGH (ref 0.61–1.24)
GFR, Estimated: 50 mL/min — ABNORMAL LOW (ref >60)
Glucose, Bld: 129 mg/dL — ABNORMAL HIGH (ref 70–99)
Potassium: 4.3 mmol/L (ref 3.5–5.1)
Sodium: 140 mmol/L (ref 135–145)
Total Bilirubin: 0.5 mg/dL (ref 0.3–1.2)
Total Protein: 5.8 g/dL — ABNORMAL LOW (ref 6.5–8.1)

## 2022-04-02 LAB — CBC WITH DIFFERENTIAL (CANCER CENTER ONLY)
Abs Immature Granulocytes: 0.01 10*3/uL (ref 0.00–0.07)
Basophils Absolute: 0 10*3/uL (ref 0.0–0.1)
Basophils Relative: 1 %
Eosinophils Absolute: 0.1 10*3/uL (ref 0.0–0.5)
Eosinophils Relative: 3 %
HCT: 34.6 % — ABNORMAL LOW (ref 39.0–52.0)
Hemoglobin: 11.7 g/dL — ABNORMAL LOW (ref 13.0–17.0)
Immature Granulocytes: 0 %
Lymphocytes Relative: 25 %
Lymphs Abs: 1.1 10*3/uL (ref 0.7–4.0)
MCH: 36.1 pg — ABNORMAL HIGH (ref 26.0–34.0)
MCHC: 33.8 g/dL (ref 30.0–36.0)
MCV: 106.8 fL — ABNORMAL HIGH (ref 80.0–100.0)
Monocytes Absolute: 0.4 10*3/uL (ref 0.1–1.0)
Monocytes Relative: 9 %
Neutro Abs: 2.7 10*3/uL (ref 1.7–7.7)
Neutrophils Relative %: 62 %
Platelet Count: 162 10*3/uL (ref 150–400)
RBC: 3.24 MIL/uL — ABNORMAL LOW (ref 4.22–5.81)
RDW: 13.3 % (ref 11.5–15.5)
WBC Count: 4.3 10*3/uL (ref 4.0–10.5)
nRBC: 0 % (ref 0.0–0.2)

## 2022-04-02 MED ORDER — SODIUM CHLORIDE 0.9% FLUSH: 10.0000 mL | Freq: Once | INTRAVENOUS | Status: DC

## 2022-04-02 MED ORDER — SODIUM CHLORIDE 0.9 % IV SOLN
10.0000 mg | Freq: Once | INTRAVENOUS | Status: AC
  Administered 2022-04-02: 10 mg via INTRAVENOUS
  Filled 2022-04-02: qty 10

## 2022-04-02 MED ORDER — SODIUM CHLORIDE 0.9 % IV SOLN: Freq: Once | INTRAVENOUS | Status: AC

## 2022-04-02 MED ORDER — ACETAMINOPHEN 500 MG PO TABS
1000.0000 mg | ORAL_TABLET | Freq: Once | ORAL | Status: AC
  Administered 2022-04-02: 1000 mg via ORAL
  Filled 2022-04-02: qty 2

## 2022-04-02 MED ORDER — HEPARIN SOD (PORK) LOCK FLUSH 100 UNIT/ML IV SOLN
500.0000 [IU] | Freq: Once | INTRAVENOUS | Status: AC | PRN
  Administered 2022-04-02: 500 [IU]

## 2022-04-02 MED ORDER — PROCHLORPERAZINE MALEATE 10 MG PO TABS
10.0000 mg | ORAL_TABLET | Freq: Once | ORAL | Status: AC
  Administered 2022-04-02: 10 mg via ORAL
  Filled 2022-04-02: qty 1

## 2022-04-02 MED ORDER — SODIUM CHLORIDE 0.9% FLUSH
10.0000 mL | INTRAVENOUS | Status: DC | PRN
  Administered 2022-04-02: 10 mL

## 2022-04-02 MED ORDER — DEXTROSE 5 % IV SOLN
56.0000 mg/m2 | Freq: Once | INTRAVENOUS | Status: AC
  Administered 2022-04-02: 120 mg via INTRAVENOUS
  Filled 2022-04-02: qty 60

## 2022-04-02 NOTE — Patient Instructions (Signed)
[facility] MEDICAL [facility]COLOGY  Discharge Instructions: Thank you for choosing [facility] to provide your oncology and hematology care.   If you have a lab appointment with the Cancer Center, please go directly to the Cancer Center and check in at the registration area.   Wear comfortable clothing and clothing appropriate for easy access to any Portacath or PICC line.   We strive to give you quality time with your provider. You may need to reschedule your appointment if you arrive late (15 or more minutes).  Arriving late affects you and other patients whose appointments are after yours.  Also, if you miss three or more appointments without notifying the office, you may be dismissed from the clinic at the provider's discretion.      For prescription refill requests, have your pharmacy contact our office and allow 72 hours for refills to be completed.    Today you received the following chemotherapy and/or immunotherapy agents: Kyprolis.       To help prevent nausea and vomiting after your treatment, we encourage you to take your nausea medication as directed.  BELOW ARE SYMPTOMS THAT SHOULD BE REPORTED IMMEDIATELY: *FEVER GREATER THAN 100.4 F (38 C) OR HIGHER *CHILLS OR SWEATING *NAUSEA AND VOMITING THAT IS NOT CONTROLLED WITH YOUR NAUSEA MEDICATION *UNUSUAL SHORTNESS OF BREATH *UNUSUAL BRUISING OR BLEEDING *URINARY PROBLEMS (pain or burning when urinating, or frequent urination) *BOWEL PROBLEMS (unusual diarrhea, constipation, pain near the anus) TENDERNESS IN MOUTH AND THROAT WITH OR WITHOUT PRESENCE OF ULCERS (sore throat, sores in mouth, or a toothache) UNUSUAL RASH, SWELLING OR PAIN  UNUSUAL VAGINAL DISCHARGE OR ITCHING   Items with * indicate a potential emergency and should be followed up as soon as possible or go to the Emergency Department if any problems should occur.  Please show the CHEMOTHERAPY ALERT CARD or IMMUNOTHERAPY ALERT CARD at check-in to  the Emergency Department and triage nurse.  Should you have questions after your visit or need to cancel or reschedule your appointment, please contact [facility] MEDICAL [facility]COLOG[facility]  Dept: [phone]  and follow the prompts.  Office hours are 8:00 a.m. to 4:30 p.m. Monday - Friday. Please note that voicemails left after 4:00 p.m. may not be returned until the following business day.  We are closed weekends and major holidays. You have access to a nurse at all times for urgent questions. Please call the main number to the clinic Dept: [phone] and follow the prompts.   For any non-urgent questions, you may also contact your provider using MyChart. We now offer e-Visits for anyone 18 and older to request care online for non-urgent symptoms. For details visit [email or web address].   Also download the MyChart app! Go to the app store, search "MyChart", open the app, select [facility], and log in with your MyChart username and password.  Masks are optional in the cancer centers. If you would like for your care team to wear a mask while they are taking care of you, please let them know. You may have one support person who is at least 75 years old accompany you for your appointments. 

## 2022-04-08 LAB — MULTIPLE MYELOMA PANEL, SERUM
Albumin SerPl Elph-Mcnc: 3.6 g/dL (ref 2.9–4.4)
Albumin/Glob SerPl: 1.9 — ABNORMAL HIGH (ref 0.7–1.7)
Alpha 1: 0.3 g/dL (ref 0.0–0.4)
Alpha2 Glob SerPl Elph-Mcnc: 0.7 g/dL (ref 0.4–1.0)
B-Globulin SerPl Elph-Mcnc: 0.8 g/dL (ref 0.7–1.3)
Gamma Glob SerPl Elph-Mcnc: 0.2 g/dL — ABNORMAL LOW (ref 0.4–1.8)
Globulin, Total: 1.9 g/dL — ABNORMAL LOW (ref 2.2–3.9)
IgA: 19 mg/dL — ABNORMAL LOW (ref 61–437)
IgG (Immunoglobin G), Serum: 183 mg/dL — ABNORMAL LOW (ref 603–1613)
IgM (Immunoglobulin M), Srm: 8 mg/dL — ABNORMAL LOW (ref 15–143)
Total Protein ELP: 5.5 g/dL — ABNORMAL LOW (ref 6.0–8.5)

## 2022-04-10 ENCOUNTER — Ambulatory Visit
Admission: RE | Admit: 2022-04-10 | Discharge: 2022-04-10 | Disposition: A | Discharge status: Home / Self Care | Payer: Medicare Other | Source: Ambulatory Visit | Attending: Neurology | Admitting: Neurology

## 2022-04-10 DIAGNOSIS — R202 Paresthesia of skin: Secondary | ICD-10-CM | POA: Diagnosis not present

## 2022-04-10 DIAGNOSIS — M545 Low back pain, unspecified: Secondary | ICD-10-CM

## 2022-04-10 DIAGNOSIS — M542 Cervicalgia: Secondary | ICD-10-CM

## 2022-04-10 DIAGNOSIS — R269 Unspecified abnormalities of gait and mobility: Secondary | ICD-10-CM

## 2022-04-12 ENCOUNTER — Other Ambulatory Visit: Payer: Self-pay

## 2022-04-12 DIAGNOSIS — C9000 Multiple myeloma not having achieved remission: Secondary | ICD-10-CM

## 2022-04-12 DIAGNOSIS — C9001 Multiple myeloma in remission: Secondary | ICD-10-CM

## 2022-04-15 ENCOUNTER — Telehealth: Payer: Self-pay | Admitting: Neurology

## 2022-04-15 ENCOUNTER — Encounter: Payer: Self-pay | Admitting: Hematology

## 2022-04-15 MED ORDER — TRAMADOL HCL 50 MG PO TABS: 50.0000 mg | ORAL_TABLET | Freq: Four times a day (QID) | ORAL | 0 refills | Status: DC | PRN

## 2022-04-15 MED FILL — Dexamethasone Sodium Phosphate Inj 100 MG/10ML: INTRAMUSCULAR | Qty: 1 | Status: AC

## 2022-04-15 NOTE — Telephone Encounter (Signed)
I called patient. I discussed his MRI results and recommendations. Pt verbalized understanding of results. Pt had no questions at this time but was encouraged to call back if questions arise.

## 2022-04-15 NOTE — Telephone Encounter (Signed)
Please call patient, MRI of cervical spine showed multilevel degenerative changes, no definite canal stenosis or foraminal narrowing  MRI of the lumbar spine showed significant multilevel degenerative changes, variable degree of foraminal narrowing, evidence of L5-S1 interbody fusion  Left sacral 15 mm depth foci consistent with multiple myeloma lesion  I will review MRI with him at next scheduled visit in Nov     IMPRESSION: This MRI of the lumbar spine without contrast shows the following: At L1-L2, there is minimal retrolisthesis and other degenerative changes causing mild foraminal and lateral recess stenosis but no spinal stenosis or nerve root compression. At L2-L3, there are degenerative changes causing moderate left foraminal narrowing and moderate left lateral recess stenosis but no spinal stenosis or definite nerve root compression. At L3-L4, there are degenerative changes causing moderate left foraminal narrowing and moderate bilateral lateral recess stenosis.  There is mild spinal stenosis.  No definite nerve root compression. At L4-L5, there are degenerative changes causing mild to moderate foraminal narrowing and lateral recess stenosis but no spinal stenosis or nerve root compression. At L5-S1, there is prior interbody fusion.  There is no spinal stenosis or nerve root compression. 15 mm focus in the left sacrum consistent with a treated metastasis or multiple myeloma lesion.    IMPRESSION: This MRI of the cervical spine shows the following: The spinal cord appears normal. There is no spinal stenosis. Multilevel degenerative changes as detailed above that do not lead to spinal stenosis.  There is multilevel foraminal narrowing, left greater than right but no definite nerve root compression.  Minimal anterolisthesis is noted at C4-C5.

## 2022-04-16 ENCOUNTER — Inpatient Hospital Stay (HOSPITAL_BASED_OUTPATIENT_CLINIC_OR_DEPARTMENT_OTHER): Payer: Medicare Other | Admitting: Hematology

## 2022-04-16 ENCOUNTER — Inpatient Hospital Stay: Payer: Medicare Other

## 2022-04-16 ENCOUNTER — Other Ambulatory Visit: Payer: Medicare Other

## 2022-04-16 ENCOUNTER — Ambulatory Visit: Payer: Medicare Other

## 2022-04-16 VITALS — BP 131/86 | HR 68 | Resp 16

## 2022-04-16 VITALS — BP 138/76 | HR 78 | Temp 98.1°F | Resp 17 | Ht 70.0 in | Wt 195.1 lb

## 2022-04-16 DIAGNOSIS — C9001 Multiple myeloma in remission: Secondary | ICD-10-CM

## 2022-04-16 DIAGNOSIS — Z5111 Encounter for antineoplastic chemotherapy: Secondary | ICD-10-CM | POA: Diagnosis not present

## 2022-04-16 DIAGNOSIS — Z95828 Presence of other vascular implants and grafts: Secondary | ICD-10-CM

## 2022-04-16 DIAGNOSIS — C9000 Multiple myeloma not having achieved remission: Secondary | ICD-10-CM

## 2022-04-16 DIAGNOSIS — Z7189 Other specified counseling: Secondary | ICD-10-CM

## 2022-04-16 DIAGNOSIS — Z5112 Encounter for antineoplastic immunotherapy: Secondary | ICD-10-CM | POA: Diagnosis not present

## 2022-04-16 LAB — CMP (CANCER CENTER ONLY)
ALT: 15 U/L (ref 0–44)
AST: 21 U/L (ref 15–41)
Albumin: 4.2 g/dL (ref 3.5–5.0)
Alkaline Phosphatase: 51 U/L (ref 38–126)
Anion gap: 3 — ABNORMAL LOW (ref 5–15)
BUN: 19 mg/dL (ref 8–23)
CO2: 30 mmol/L (ref 22–32)
Calcium: 9.4 mg/dL (ref 8.9–10.3)
Chloride: 105 mmol/L (ref 98–111)
Creatinine: 1.49 mg/dL — ABNORMAL HIGH (ref 0.61–1.24)
GFR, Estimated: 49 mL/min — ABNORMAL LOW (ref >60)
Glucose, Bld: 80 mg/dL (ref 70–99)
Potassium: 4.4 mmol/L (ref 3.5–5.1)
Sodium: 138 mmol/L (ref 135–145)
Total Bilirubin: 0.6 mg/dL (ref 0.3–1.2)
Total Protein: 6.1 g/dL — ABNORMAL LOW (ref 6.5–8.1)

## 2022-04-16 LAB — CBC WITH DIFFERENTIAL (CANCER CENTER ONLY)
Abs Immature Granulocytes: 0.01 10*3/uL (ref 0.00–0.07)
Basophils Absolute: 0 10*3/uL (ref 0.0–0.1)
Basophils Relative: 1 %
Eosinophils Absolute: 0.2 10*3/uL (ref 0.0–0.5)
Eosinophils Relative: 4 %
HCT: 34.7 % — ABNORMAL LOW (ref 39.0–52.0)
Hemoglobin: 11.9 g/dL — ABNORMAL LOW (ref 13.0–17.0)
Immature Granulocytes: 0 %
Lymphocytes Relative: 35 %
Lymphs Abs: 1.5 10*3/uL (ref 0.7–4.0)
MCH: 36.3 pg — ABNORMAL HIGH (ref 26.0–34.0)
MCHC: 34.3 g/dL (ref 30.0–36.0)
MCV: 105.8 fL — ABNORMAL HIGH (ref 80.0–100.0)
Monocytes Absolute: 0.5 10*3/uL (ref 0.1–1.0)
Monocytes Relative: 11 %
Neutro Abs: 2.2 10*3/uL (ref 1.7–7.7)
Neutrophils Relative %: 49 %
Platelet Count: 159 10*3/uL (ref 150–400)
RBC: 3.28 MIL/uL — ABNORMAL LOW (ref 4.22–5.81)
RDW: 13.3 % (ref 11.5–15.5)
WBC Count: 4.3 10*3/uL (ref 4.0–10.5)
nRBC: 0 % (ref 0.0–0.2)

## 2022-04-16 MED ORDER — SODIUM CHLORIDE 0.9% FLUSH
10.0000 mL | Freq: Once | INTRAVENOUS | Status: AC
  Administered 2022-04-16: 10 mL

## 2022-04-16 MED ORDER — HEPARIN SOD (PORK) LOCK FLUSH 100 UNIT/ML IV SOLN
500.0000 [IU] | Freq: Once | INTRAVENOUS | Status: AC | PRN
  Administered 2022-04-16: 500 [IU]

## 2022-04-16 MED ORDER — SODIUM CHLORIDE 0.9 % IV SOLN: Freq: Once | INTRAVENOUS | Status: AC

## 2022-04-16 MED ORDER — DEXTROSE 5 % IV SOLN
56.0000 mg/m2 | Freq: Once | INTRAVENOUS | Status: AC
  Administered 2022-04-16: 120 mg via INTRAVENOUS
  Filled 2022-04-16: qty 60

## 2022-04-16 MED ORDER — SODIUM CHLORIDE 0.9% FLUSH
10.0000 mL | INTRAVENOUS | Status: DC | PRN
  Administered 2022-04-16: 10 mL

## 2022-04-16 MED ORDER — SODIUM CHLORIDE 0.9 % IV SOLN
10.0000 mg | Freq: Once | INTRAVENOUS | Status: AC
  Administered 2022-04-16: 10 mg via INTRAVENOUS
  Filled 2022-04-16: qty 10

## 2022-04-16 MED ORDER — ACETAMINOPHEN 500 MG PO TABS
1000.0000 mg | ORAL_TABLET | Freq: Once | ORAL | Status: AC
  Administered 2022-04-16: 1000 mg via ORAL
  Filled 2022-04-16: qty 2

## 2022-04-16 MED ORDER — PROCHLORPERAZINE MALEATE 10 MG PO TABS
10.0000 mg | ORAL_TABLET | Freq: Once | ORAL | Status: AC
  Administered 2022-04-16: 10 mg via ORAL
  Filled 2022-04-16: qty 1

## 2022-04-16 NOTE — Patient Instructions (Signed)
[facility] MEDICAL [facility]COLOGY  Discharge Instructions: Thank you for choosing [facility] to provide your oncology and hematology care.   If you have a lab appointment with the Cancer Center, please go directly to the Cancer Center and check in at the registration area.   Wear comfortable clothing and clothing appropriate for easy access to any Portacath or PICC line.   We strive to give you quality time with your provider. You may need to reschedule your appointment if you arrive late (15 or more minutes).  Arriving late affects you and other patients whose appointments are after yours.  Also, if you miss three or more appointments without notifying the office, you may be dismissed from the clinic at the provider's discretion.      For prescription refill requests, have your pharmacy contact our office and allow 72 hours for refills to be completed.    Today you received the following chemotherapy and/or immunotherapy agents: Carfilzomib (Kyprolis)     To help prevent nausea and vomiting after your treatment, we encourage you to take your nausea medication as directed.  BELOW ARE SYMPTOMS THAT SHOULD BE REPORTED IMMEDIATELY: *FEVER GREATER THAN 100.4 F (38 C) OR HIGHER *CHILLS OR SWEATING *NAUSEA AND VOMITING THAT IS NOT CONTROLLED WITH YOUR NAUSEA MEDICATION *UNUSUAL SHORTNESS OF BREATH *UNUSUAL BRUISING OR BLEEDING *URINARY PROBLEMS (pain or burning when urinating, or frequent urination) *BOWEL PROBLEMS (unusual diarrhea, constipation, pain near the anus) TENDERNESS IN MOUTH AND THROAT WITH OR WITHOUT PRESENCE OF ULCERS (sore throat, sores in mouth, or a toothache) UNUSUAL RASH, SWELLING OR PAIN  UNUSUAL VAGINAL DISCHARGE OR ITCHING   Items with * indicate a potential emergency and should be followed up as soon as possible or go to the Emergency Department if any problems should occur.  Please show the CHEMOTHERAPY ALERT CARD or IMMUNOTHERAPY ALERT CARD at  check-in to the Emergency Department and triage nurse.  Should you have questions after your visit or need to cancel or reschedule your appointment, please contact [facility]COL[facility]  Dept: [phone]  and follow the prompts.  Office hours are 8:00 a.m. to 4:30 p.m. Monday - Friday. Please note that voicemails left after 4:00 p.m. may not be returned until the following business day.  We are closed weekends and major holidays. You have access to a nurse at all times for urgent questions. Please call the main number to the clinic Dept: [phone] and follow the prompts.   For any non-urgent questions, you may also contact your provider using MyChart. We now offer e-Visits for anyone 18 and older to request care online for non-urgent symptoms. For details visit [email or web address].   Also download the MyChart app! Go to the app store, search "MyChart", open the app, select [facility], and log in with your MyChart username and password.  Masks are optional in the cancer centers. If you would like for your care team to wear a mask while they are taking care of you, please let them know. You may have one support person who is at least 75 years old accompany you for your appointments. 
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
## 2022-04-17 ENCOUNTER — Encounter: Payer: Self-pay | Admitting: Hematology

## 2022-04-19 ENCOUNTER — Other Ambulatory Visit: Payer: Self-pay | Admitting: Hematology

## 2022-04-19 MED ORDER — ERGOCALCIFEROL 1.25 MG (50000 UT) PO CAPS: 50000.0000 [IU] | ORAL_CAPSULE | ORAL | 3 refills | Status: DC

## 2022-04-22 ENCOUNTER — Encounter: Payer: Self-pay | Admitting: Hematology

## 2022-04-29 ENCOUNTER — Other Ambulatory Visit: Payer: Self-pay

## 2022-04-29 DIAGNOSIS — C9001 Multiple myeloma in remission: Secondary | ICD-10-CM

## 2022-04-29 MED FILL — Dexamethasone Sodium Phosphate Inj 100 MG/10ML: INTRAMUSCULAR | Qty: 1 | Status: AC

## 2022-04-30 ENCOUNTER — Inpatient Hospital Stay: Payer: Medicare Other

## 2022-04-30 ENCOUNTER — Inpatient Hospital Stay: Payer: Medicare Other | Attending: Hematology

## 2022-04-30 VITALS — BP 123/84 | HR 68 | Temp 98.1°F | Resp 17 | Wt 194.5 lb

## 2022-04-30 DIAGNOSIS — Z95828 Presence of other vascular implants and grafts: Secondary | ICD-10-CM

## 2022-04-30 DIAGNOSIS — C9000 Multiple myeloma not having achieved remission: Secondary | ICD-10-CM

## 2022-04-30 DIAGNOSIS — Z79899 Other long term (current) drug therapy: Secondary | ICD-10-CM | POA: Insufficient documentation

## 2022-04-30 DIAGNOSIS — C9001 Multiple myeloma in remission: Secondary | ICD-10-CM

## 2022-04-30 DIAGNOSIS — Z5112 Encounter for antineoplastic immunotherapy: Secondary | ICD-10-CM | POA: Diagnosis present

## 2022-04-30 DIAGNOSIS — Z7189 Other specified counseling: Secondary | ICD-10-CM

## 2022-04-30 LAB — CBC WITH DIFFERENTIAL (CANCER CENTER ONLY)
Abs Immature Granulocytes: 0.01 10*3/uL (ref 0.00–0.07)
Basophils Absolute: 0.1 10*3/uL (ref 0.0–0.1)
Basophils Relative: 1 %
Eosinophils Absolute: 0.2 10*3/uL (ref 0.0–0.5)
Eosinophils Relative: 5 %
HCT: 34.4 % — ABNORMAL LOW (ref 39.0–52.0)
Hemoglobin: 11.9 g/dL — ABNORMAL LOW (ref 13.0–17.0)
Immature Granulocytes: 0 %
Lymphocytes Relative: 36 %
Lymphs Abs: 1.5 10*3/uL (ref 0.7–4.0)
MCH: 36.6 pg — ABNORMAL HIGH (ref 26.0–34.0)
MCHC: 34.6 g/dL (ref 30.0–36.0)
MCV: 105.8 fL — ABNORMAL HIGH (ref 80.0–100.0)
Monocytes Absolute: 0.4 10*3/uL (ref 0.1–1.0)
Monocytes Relative: 9 %
Neutro Abs: 2 10*3/uL (ref 1.7–7.7)
Neutrophils Relative %: 49 %
Platelet Count: 166 10*3/uL (ref 150–400)
RBC: 3.25 MIL/uL — ABNORMAL LOW (ref 4.22–5.81)
RDW: 13.2 % (ref 11.5–15.5)
WBC Count: 4 10*3/uL (ref 4.0–10.5)
nRBC: 0 % (ref 0.0–0.2)

## 2022-04-30 LAB — CMP (CANCER CENTER ONLY)
ALT: 17 U/L (ref 0–44)
AST: 24 U/L (ref 15–41)
Albumin: 4.1 g/dL (ref 3.5–5.0)
Alkaline Phosphatase: 60 U/L (ref 38–126)
Anion gap: 6 (ref 5–15)
BUN: 20 mg/dL (ref 8–23)
CO2: 26 mmol/L (ref 22–32)
Calcium: 9.3 mg/dL (ref 8.9–10.3)
Chloride: 107 mmol/L (ref 98–111)
Creatinine: 1.63 mg/dL — ABNORMAL HIGH (ref 0.61–1.24)
GFR, Estimated: 44 mL/min — ABNORMAL LOW (ref >60)
Glucose, Bld: 154 mg/dL — ABNORMAL HIGH (ref 70–99)
Potassium: 4 mmol/L (ref 3.5–5.1)
Sodium: 139 mmol/L (ref 135–145)
Total Bilirubin: 0.6 mg/dL (ref 0.3–1.2)
Total Protein: 6.1 g/dL — ABNORMAL LOW (ref 6.5–8.1)

## 2022-04-30 MED ORDER — SODIUM CHLORIDE 0.9 % IV SOLN: Freq: Once | INTRAVENOUS | Status: AC

## 2022-04-30 MED ORDER — DEXTROSE 5 % IV SOLN
56.0000 mg/m2 | Freq: Once | INTRAVENOUS | Status: AC
  Administered 2022-04-30: 120 mg via INTRAVENOUS
  Filled 2022-04-30: qty 60

## 2022-04-30 MED ORDER — PROCHLORPERAZINE MALEATE 10 MG PO TABS
10.0000 mg | ORAL_TABLET | Freq: Once | ORAL | Status: AC
  Administered 2022-04-30: 10 mg via ORAL
  Filled 2022-04-30: qty 1

## 2022-04-30 MED ORDER — HEPARIN SOD (PORK) LOCK FLUSH 100 UNIT/ML IV SOLN
500.0000 [IU] | Freq: Once | INTRAVENOUS | Status: AC | PRN
  Administered 2022-04-30: 500 [IU]

## 2022-04-30 MED ORDER — SODIUM CHLORIDE 0.9 % IV SOLN
10.0000 mg | Freq: Once | INTRAVENOUS | Status: AC
  Administered 2022-04-30: 10 mg via INTRAVENOUS
  Filled 2022-04-30: qty 10

## 2022-04-30 MED ORDER — SODIUM CHLORIDE 0.9% FLUSH
10.0000 mL | INTRAVENOUS | Status: DC | PRN
  Administered 2022-04-30: 10 mL

## 2022-04-30 MED ORDER — ACETAMINOPHEN 500 MG PO TABS
1000.0000 mg | ORAL_TABLET | Freq: Once | ORAL | Status: AC
  Administered 2022-04-30: 1000 mg via ORAL
  Filled 2022-04-30: qty 2

## 2022-04-30 MED ORDER — SODIUM CHLORIDE 0.9% FLUSH
10.0000 mL | Freq: Once | INTRAVENOUS | Status: AC
  Administered 2022-04-30: 10 mL

## 2022-04-30 NOTE — Patient Instructions (Signed)
[facility] MEDICAL ONCOLOGY  Discharge Instructions: Thank you for choosing [facility] to provide your oncology and hematology care.   If you have a lab appointment with the Cancer Center, please go directly to the Cancer Center and check in at the registration area.   Wear comfortable clothing and clothing appropriate for easy access to any Portacath or PICC line.   We strive to give you quality time with your provider. You may need to reschedule your appointment if you arrive late (15 or more minutes).  Arriving late affects you and other patients whose appointments are after yours.  Also, if you miss three or more appointments without notifying the office, you may be dismissed from the clinic at the provider's discretion.      For prescription refill requests, have your pharmacy contact our office and allow 72 hours for refills to be completed.    Today you received the following chemotherapy and/or immunotherapy agents: Kyprolis.       To help prevent nausea and vomiting after your treatment, we encourage you to take your nausea medication as directed.  BELOW ARE SYMPTOMS THAT SHOULD BE REPORTED IMMEDIATELY: *FEVER GREATER THAN 100.4 F (38 C) OR HIGHER *CHILLS OR SWEATING *NAUSEA AND VOMITING THAT IS NOT CONTROLLED WITH YOUR NAUSEA MEDICATION *UNUSUAL SHORTNESS OF BREATH *UNUSUAL BRUISING OR BLEEDING *URINARY PROBLEMS (pain or burning when urinating, or frequent urination) *BOWEL PROBLEMS (unusual diarrhea, constipation, pain near the anus) TENDERNESS IN MOUTH AND THROAT WITH OR WITHOUT PRESENCE OF ULCERS (sore throat, sores in mouth, or a toothache) UNUSUAL RASH, SWELLING OR PAIN  UNUSUAL VAGINAL DISCHARGE OR ITCHING   Items with * indicate a potential emergency and should be followed up as soon as possible or go to the Emergency Department if any problems should occur.  Please show the CHEMOTHERAPY ALERT CARD or IMMUNOTHERAPY ALERT CARD at check-in to  the Emergency Department and triage nurse.  Should you have questions after your visit or need to cancel or reschedule your appointment, please contact [facility] MEDICAL ONCOLOGY  Dept: [phone]  and follow the prompts.  Office hours are 8:00 a.m. to 4:30 p.m. Monday - Friday. Please note that voicemails left after 4:00 p.m. may not be returned until the following business day.  We are closed weekends and major holidays. You have access to a nurse at all times for urgent questions. Please call the main number to the clinic Dept: [phone] and follow the prompts.   For any non-urgent questions, you may also contact your provider using MyChart. We now offer e-Visits for anyone 18 and older to request care online for non-urgent symptoms. For details visit [email or web address].   Also download the MyChart app! Go to the app store, search "MyChart", open the app, select [facility], and log in with your MyChart username and password.  Masks are optional in the cancer centers. If you would like for your care team to wear a mask while they are taking care of you, please let them know. You may have one support person who is at least 75 years old accompany you for your appointments. 

## 2022-04-30 NOTE — Progress Notes (Signed)
Per Dr Irene Limbo, ok to treat with creat 1.63 mg/dL

## 2022-05-01 ENCOUNTER — Other Ambulatory Visit: Payer: Self-pay | Admitting: Hematology

## 2022-05-01 ENCOUNTER — Other Ambulatory Visit: Payer: Self-pay

## 2022-05-01 ENCOUNTER — Encounter: Payer: Self-pay | Admitting: Hematology

## 2022-05-01 DIAGNOSIS — C9001 Multiple myeloma in remission: Secondary | ICD-10-CM

## 2022-05-01 DIAGNOSIS — C9000 Multiple myeloma not having achieved remission: Secondary | ICD-10-CM

## 2022-05-02 ENCOUNTER — Encounter: Payer: Self-pay | Admitting: Hematology

## 2022-05-02 MED ORDER — TRAMADOL HCL 50 MG PO TABS: 50.0000 mg | ORAL_TABLET | Freq: Four times a day (QID) | ORAL | 0 refills | Status: DC | PRN

## 2022-05-13 ENCOUNTER — Other Ambulatory Visit: Payer: Self-pay

## 2022-05-13 DIAGNOSIS — C9001 Multiple myeloma in remission: Secondary | ICD-10-CM

## 2022-05-14 ENCOUNTER — Inpatient Hospital Stay: Payer: Medicare Other

## 2022-05-14 ENCOUNTER — Other Ambulatory Visit: Payer: Medicare Other

## 2022-05-14 ENCOUNTER — Other Ambulatory Visit: Payer: Self-pay

## 2022-05-14 ENCOUNTER — Ambulatory Visit: Payer: Medicare Other

## 2022-05-14 VITALS — BP 126/83 | HR 85 | Temp 97.5°F | Resp 18 | Wt 195.1 lb

## 2022-05-14 DIAGNOSIS — C9001 Multiple myeloma in remission: Secondary | ICD-10-CM

## 2022-05-14 DIAGNOSIS — C9000 Multiple myeloma not having achieved remission: Secondary | ICD-10-CM

## 2022-05-14 DIAGNOSIS — Z5112 Encounter for antineoplastic immunotherapy: Secondary | ICD-10-CM | POA: Diagnosis not present

## 2022-05-14 DIAGNOSIS — Z7189 Other specified counseling: Secondary | ICD-10-CM

## 2022-05-14 LAB — CBC WITH DIFFERENTIAL (CANCER CENTER ONLY)
Abs Immature Granulocytes: 0.02 10*3/uL (ref 0.00–0.07)
Basophils Absolute: 0.1 10*3/uL (ref 0.0–0.1)
Basophils Relative: 1 %
Eosinophils Absolute: 0.2 10*3/uL (ref 0.0–0.5)
Eosinophils Relative: 5 %
HCT: 35.5 % — ABNORMAL LOW (ref 39.0–52.0)
Hemoglobin: 12.2 g/dL — ABNORMAL LOW (ref 13.0–17.0)
Immature Granulocytes: 1 %
Lymphocytes Relative: 34 %
Lymphs Abs: 1.4 10*3/uL (ref 0.7–4.0)
MCH: 36.3 pg — ABNORMAL HIGH (ref 26.0–34.0)
MCHC: 34.4 g/dL (ref 30.0–36.0)
MCV: 105.7 fL — ABNORMAL HIGH (ref 80.0–100.0)
Monocytes Absolute: 0.4 10*3/uL (ref 0.1–1.0)
Monocytes Relative: 10 %
Neutro Abs: 2 10*3/uL (ref 1.7–7.7)
Neutrophils Relative %: 49 %
Platelet Count: 175 10*3/uL (ref 150–400)
RBC: 3.36 MIL/uL — ABNORMAL LOW (ref 4.22–5.81)
RDW: 13.3 % (ref 11.5–15.5)
WBC Count: 4.2 10*3/uL (ref 4.0–10.5)
nRBC: 0 % (ref 0.0–0.2)

## 2022-05-14 LAB — CMP (CANCER CENTER ONLY)
ALT: 16 U/L (ref 0–44)
AST: 25 U/L (ref 15–41)
Albumin: 4.1 g/dL (ref 3.5–5.0)
Alkaline Phosphatase: 55 U/L (ref 38–126)
Anion gap: 8 (ref 5–15)
BUN: 16 mg/dL (ref 8–23)
CO2: 27 mmol/L (ref 22–32)
Calcium: 9.4 mg/dL (ref 8.9–10.3)
Chloride: 106 mmol/L (ref 98–111)
Creatinine: 1.56 mg/dL — ABNORMAL HIGH (ref 0.61–1.24)
GFR, Estimated: 46 mL/min — ABNORMAL LOW (ref >60)
Glucose, Bld: 138 mg/dL — ABNORMAL HIGH (ref 70–99)
Potassium: 3.9 mmol/L (ref 3.5–5.1)
Sodium: 141 mmol/L (ref 135–145)
Total Bilirubin: 0.7 mg/dL (ref 0.3–1.2)
Total Protein: 6 g/dL — ABNORMAL LOW (ref 6.5–8.1)

## 2022-05-14 MED ORDER — ACETAMINOPHEN 500 MG PO TABS
1000.0000 mg | ORAL_TABLET | Freq: Once | ORAL | Status: AC
  Administered 2022-05-14: 1000 mg via ORAL
  Filled 2022-05-14: qty 2

## 2022-05-14 MED ORDER — SODIUM CHLORIDE 0.9 % IV SOLN
10.0000 mg | Freq: Once | INTRAVENOUS | Status: AC
  Administered 2022-05-14: 10 mg via INTRAVENOUS
  Filled 2022-05-14: qty 10

## 2022-05-14 MED ORDER — SODIUM CHLORIDE 0.9 % IV SOLN: Freq: Once | INTRAVENOUS | Status: AC

## 2022-05-14 MED ORDER — SODIUM CHLORIDE 0.9% FLUSH
10.0000 mL | INTRAVENOUS | Status: DC | PRN
  Administered 2022-05-14: 10 mL

## 2022-05-14 MED ORDER — PROCHLORPERAZINE MALEATE 10 MG PO TABS
10.0000 mg | ORAL_TABLET | Freq: Once | ORAL | Status: AC
  Administered 2022-05-14: 10 mg via ORAL
  Filled 2022-05-14: qty 1

## 2022-05-14 MED ORDER — DEXTROSE 5 % IV SOLN
56.0000 mg/m2 | Freq: Once | INTRAVENOUS | Status: AC
  Administered 2022-05-14: 120 mg via INTRAVENOUS
  Filled 2022-05-14: qty 60

## 2022-05-14 MED ORDER — HEPARIN SOD (PORK) LOCK FLUSH 100 UNIT/ML IV SOLN
500.0000 [IU] | Freq: Once | INTRAVENOUS | Status: AC | PRN
  Administered 2022-05-14: 500 [IU]

## 2022-05-14 NOTE — Patient Instructions (Signed)
[facility]ICAL [facility]COLOG[facility]  Discharge Instructions: Thank you for choosing [facility] to provide your oncology and hematology care.   If you have a lab appointment with the Cancer Center, please go directly to the Cancer Center and check in at the registration area.   Wear comfortable clothing and clothing appropriate for easy access to any Portacath or PICC line.   We strive to give you quality time with your provider. You may need to reschedule your appointment if you arrive late (15 or more minutes).  Arriving late affects you and other patients whose appointments are after yours.  Also, if you miss three or more appointments without notifying the office, you may be dismissed from the clinic at the provider's discretion.      For prescription refill requests, have your pharmacy contact our office and allow 72 hours for refills to be completed.    Today you received the following chemotherapy and/or immunotherapy agents: Kyprolis.       To help prevent nausea and vomiting after your treatment, we encourage you to take your nausea medication as directed.  BELOW ARE SYMPTOMS THAT SHOULD BE REPORTED IMMEDIATELY: *FEVER GREATER THAN 100.4 F (38 C) OR HIGHER *CHILLS OR SWEATING *NAUSEA AND VOMITING THAT IS NOT CONTROLLED WITH YOUR NAUSEA MEDICATION *UNUSUAL SHORTNESS OF BREATH *UNUSUAL BRUISING OR BLEEDING *URINARY PROBLEMS (pain or burning when urinating, or frequent urination) *BOWEL PROBLEMS (unusual diarrhea, constipation, pain near the anus) TENDERNESS IN MOUTH AND THROAT WITH OR WITHOUT PRESENCE OF ULCERS (sore throat, sores in mouth, or a toothache) UNUSUAL RASH, SWELLING OR PAIN  UNUSUAL VAGINAL DISCHARGE OR ITCHING   Items with * indicate a potential emergency and should be followed up as soon as possible or go to the Emergency Department if any problems should occur.  Please show the CHEMOTHERAPY ALERT CARD or IMMUNOTHERAPY ALERT CARD at check-in to  the Emergency Department and triage nurse.  Should you have questions after your visit or need to cancel or reschedule your appointment, please contact [facility]  Dept: [phone]  and follow the prompts.  Office hours are 8:00 a.m. to 4:30 p.m. Monday - Friday. Please note that voicemails left after 4:00 p.m. may not be returned until the following business day.  We are closed weekends and major holidays. You have access to a nurse at all times for urgent questions. Please call the main number to the clinic Dept: [phone] and follow the prompts.   For any non-urgent questions, you may also contact your provider using MyChart. We now offer e-Visits for anyone 18 and older to request care online for non-urgent symptoms. For details visit [email or web address].   Also download the MyChart app! Go to the app store, search "MyChart", open the app, select [facility], and log in with your MyChart username and password.  Masks are optional in the cancer centers. If you would like for your care team to wear a mask while they are taking care of you, please let them know. You may have one support person who is at least 75 years old accompany you for your appointments. 

## 2022-05-14 NOTE — Progress Notes (Signed)
Per Lorenso Courier MD, ok to treat today with SCR 1.56

## 2022-05-20 LAB — MULTIPLE MYELOMA PANEL, SERUM
Albumin SerPl Elph-Mcnc: 3.8 g/dL (ref 2.9–4.4)
Albumin/Glob SerPl: 2.1 — ABNORMAL HIGH (ref 0.7–1.7)
Alpha 1: 0.2 g/dL (ref 0.0–0.4)
Alpha2 Glob SerPl Elph-Mcnc: 0.7 g/dL (ref 0.4–1.0)
B-Globulin SerPl Elph-Mcnc: 0.8 g/dL (ref 0.7–1.3)
Gamma Glob SerPl Elph-Mcnc: 0.2 g/dL — ABNORMAL LOW (ref 0.4–1.8)
Globulin, Total: 1.9 g/dL — ABNORMAL LOW (ref 2.2–3.9)
IgA: 21 mg/dL — ABNORMAL LOW (ref 61–437)
IgG (Immunoglobin G), Serum: 244 mg/dL — ABNORMAL LOW (ref 603–1613)
IgM (Immunoglobulin M), Srm: 12 mg/dL — ABNORMAL LOW (ref 15–143)
Total Protein ELP: 5.7 g/dL — ABNORMAL LOW (ref 6.0–8.5)

## 2022-05-22 ENCOUNTER — Encounter: Payer: Self-pay | Admitting: Hematology

## 2022-05-22 ENCOUNTER — Other Ambulatory Visit: Payer: Self-pay

## 2022-05-22 NOTE — Telephone Encounter (Signed)
error 

## 2022-05-23 ENCOUNTER — Other Ambulatory Visit: Payer: Self-pay

## 2022-05-27 ENCOUNTER — Other Ambulatory Visit: Payer: Self-pay

## 2022-05-27 DIAGNOSIS — C9001 Multiple myeloma in remission: Secondary | ICD-10-CM

## 2022-05-27 DIAGNOSIS — C9000 Multiple myeloma not having achieved remission: Secondary | ICD-10-CM

## 2022-05-28 ENCOUNTER — Other Ambulatory Visit: Payer: Self-pay

## 2022-05-28 MED ORDER — TRAMADOL HCL 50 MG PO TABS: 50.0000 mg | ORAL_TABLET | Freq: Four times a day (QID) | ORAL | 0 refills | Status: DC | PRN

## 2022-05-28 MED FILL — Dexamethasone Sodium Phosphate Inj 100 MG/10ML: INTRAMUSCULAR | Qty: 1 | Status: AC

## 2022-05-29 ENCOUNTER — Inpatient Hospital Stay: Payer: Medicare Other | Attending: Hematology

## 2022-05-29 ENCOUNTER — Inpatient Hospital Stay (HOSPITAL_BASED_OUTPATIENT_CLINIC_OR_DEPARTMENT_OTHER): Payer: Medicare Other | Admitting: Hematology

## 2022-05-29 ENCOUNTER — Inpatient Hospital Stay: Payer: Medicare Other

## 2022-05-29 VITALS — BP 137/90 | HR 72 | Temp 97.5°F | Resp 16 | Wt 195.8 lb

## 2022-05-29 DIAGNOSIS — Z5111 Encounter for antineoplastic chemotherapy: Secondary | ICD-10-CM

## 2022-05-29 DIAGNOSIS — Z5112 Encounter for antineoplastic immunotherapy: Secondary | ICD-10-CM | POA: Insufficient documentation

## 2022-05-29 DIAGNOSIS — C9001 Multiple myeloma in remission: Secondary | ICD-10-CM | POA: Diagnosis not present

## 2022-05-29 DIAGNOSIS — Z79899 Other long term (current) drug therapy: Secondary | ICD-10-CM | POA: Insufficient documentation

## 2022-05-29 DIAGNOSIS — C9000 Multiple myeloma not having achieved remission: Secondary | ICD-10-CM

## 2022-05-29 DIAGNOSIS — Z7189 Other specified counseling: Secondary | ICD-10-CM

## 2022-05-29 LAB — CBC WITH DIFFERENTIAL (CANCER CENTER ONLY)
Abs Immature Granulocytes: 0.01 10*3/uL (ref 0.00–0.07)
Basophils Absolute: 0 10*3/uL (ref 0.0–0.1)
Basophils Relative: 1 %
Eosinophils Absolute: 0.1 10*3/uL (ref 0.0–0.5)
Eosinophils Relative: 2 %
HCT: 34.7 % — ABNORMAL LOW (ref 39.0–52.0)
Hemoglobin: 11.9 g/dL — ABNORMAL LOW (ref 13.0–17.0)
Immature Granulocytes: 0 %
Lymphocytes Relative: 26 %
Lymphs Abs: 1 10*3/uL (ref 0.7–4.0)
MCH: 36.3 pg — ABNORMAL HIGH (ref 26.0–34.0)
MCHC: 34.3 g/dL (ref 30.0–36.0)
MCV: 105.8 fL — ABNORMAL HIGH (ref 80.0–100.0)
Monocytes Absolute: 0.4 10*3/uL (ref 0.1–1.0)
Monocytes Relative: 9 %
Neutro Abs: 2.4 10*3/uL (ref 1.7–7.7)
Neutrophils Relative %: 62 %
Platelet Count: 164 10*3/uL (ref 150–400)
RBC: 3.28 MIL/uL — ABNORMAL LOW (ref 4.22–5.81)
RDW: 13.2 % (ref 11.5–15.5)
WBC Count: 3.9 10*3/uL — ABNORMAL LOW (ref 4.0–10.5)
nRBC: 0 % (ref 0.0–0.2)

## 2022-05-29 LAB — CMP (CANCER CENTER ONLY)
ALT: 16 U/L (ref 0–44)
AST: 22 U/L (ref 15–41)
Albumin: 3.9 g/dL (ref 3.5–5.0)
Alkaline Phosphatase: 60 U/L (ref 38–126)
Anion gap: 7 (ref 5–15)
BUN: 19 mg/dL (ref 8–23)
CO2: 25 mmol/L (ref 22–32)
Calcium: 9.1 mg/dL (ref 8.9–10.3)
Chloride: 107 mmol/L (ref 98–111)
Creatinine: 1.49 mg/dL — ABNORMAL HIGH (ref 0.61–1.24)
GFR, Estimated: 49 mL/min — ABNORMAL LOW (ref >60)
Glucose, Bld: 152 mg/dL — ABNORMAL HIGH (ref 70–99)
Potassium: 4.2 mmol/L (ref 3.5–5.1)
Sodium: 139 mmol/L (ref 135–145)
Total Bilirubin: 0.6 mg/dL (ref 0.3–1.2)
Total Protein: 5.7 g/dL — ABNORMAL LOW (ref 6.5–8.1)

## 2022-05-29 MED ORDER — SODIUM CHLORIDE 0.9 % IV SOLN: Freq: Once | INTRAVENOUS | Status: AC

## 2022-05-29 MED ORDER — SODIUM CHLORIDE 0.9% FLUSH
10.0000 mL | INTRAVENOUS | Status: DC | PRN
  Administered 2022-05-29: 10 mL

## 2022-05-29 MED ORDER — SODIUM CHLORIDE 0.9 % IV SOLN
10.0000 mg | Freq: Once | INTRAVENOUS | Status: AC
  Administered 2022-05-29: 10 mg via INTRAVENOUS
  Filled 2022-05-29: qty 10

## 2022-05-29 MED ORDER — ACETAMINOPHEN 500 MG PO TABS
1000.0000 mg | ORAL_TABLET | Freq: Once | ORAL | Status: AC
  Administered 2022-05-29: 1000 mg via ORAL
  Filled 2022-05-29: qty 2

## 2022-05-29 MED ORDER — HEPARIN SOD (PORK) LOCK FLUSH 100 UNIT/ML IV SOLN
500.0000 [IU] | Freq: Once | INTRAVENOUS | Status: AC | PRN
  Administered 2022-05-29: 500 [IU]

## 2022-05-29 MED ORDER — DEXTROSE 5 % IV SOLN
56.0000 mg/m2 | Freq: Once | INTRAVENOUS | Status: AC
  Administered 2022-05-29: 120 mg via INTRAVENOUS
  Filled 2022-05-29: qty 60

## 2022-05-29 MED ORDER — PROCHLORPERAZINE MALEATE 10 MG PO TABS
10.0000 mg | ORAL_TABLET | Freq: Once | ORAL | Status: AC
  Administered 2022-05-29: 10 mg via ORAL
  Filled 2022-05-29: qty 1

## 2022-05-29 NOTE — Patient Instructions (Signed)
[facility]COL[facility]  Discharge Instructions: Thank you for choosing [facility] to provide your oncology and hematology care.   If you have a lab appointment with the Cancer Center, please go directly to the Cancer Center and check in at the registration area.   Wear comfortable clothing and clothing appropriate for easy access to any Portacath or PICC line.   We strive to give you quality time with your provider. You may need to reschedule your appointment if you arrive late (15 or more minutes).  Arriving late affects you and other patients whose appointments are after yours.  Also, if you miss three or more appointments without notifying the office, you may be dismissed from the clinic at the provider's discretion.      For prescription refill requests, have your pharmacy contact our office and allow 72 hours for refills to be completed.    Today you received the following chemotherapy and/or immunotherapy agents: Kyprolis.       To help prevent nausea and vomiting after your treatment, we encourage you to take your nausea medication as directed.  BELOW ARE SYMPTOMS THAT SHOULD BE REPORTED IMMEDIATELY: *FEVER GREATER THAN 100.4 F (38 C) OR HIGHER *CHILLS OR SWEATING *NAUSEA AND VOMITING THAT IS NOT CONTROLLED WITH YOUR NAUSEA MEDICATION *UNUSUAL SHORTNESS OF BREATH *UNUSUAL BRUISING OR BLEEDING *URINARY PROBLEMS (pain or burning when urinating, or frequent urination) *BOWEL PROBLEMS (unusual diarrhea, constipation, pain near the anus) TENDERNESS IN MOUTH AND THROAT WITH OR WITHOUT PRESENCE OF ULCERS (sore throat, sores in mouth, or a toothache) UNUSUAL RASH, SWELLING OR PAIN  UNUSUAL VAGINAL DISCHARGE OR ITCHING   Items with * indicate a potential emergency and should be followed up as soon as possible or go to the Emergency Department if any problems should occur.  Please show the CHEMOTHERAPY ALERT CARD or IMMUNOTHERAPY ALERT CARD at check-in to  the Emergency Department and triage nurse.  Should you have questions after your visit or need to cancel or reschedule your appointment, please contact [facility]  Dept: [phone]  and follow the prompts.  Office hours are 8:00 a.m. to 4:30 p.m. Monday - Friday. Please note that voicemails left after 4:00 p.m. may not be returned until the following business day.  We are closed weekends and major holidays. You have access to a nurse at all times for urgent questions. Please call the main number to the clinic Dept: [phone] and follow the prompts.   For any non-urgent questions, you may also contact your provider using MyChart. We now offer e-Visits for anyone 18 and older to request care online for non-urgent symptoms. For details visit [email or web address].   Also download the MyChart app! Go to the app store, search "MyChart", open the app, select [facility], and log in with your MyChart username and password.  Masks are optional in the cancer centers. If you would like for your care team to wear a mask while they are taking care of you, please let them know. You may have one support person who is at least 75 years old accompany you for your appointments. 
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
## 2022-06-04 ENCOUNTER — Ambulatory Visit: Payer: Medicare Other | Admitting: Neurology

## 2022-06-04 ENCOUNTER — Encounter: Payer: Self-pay | Admitting: Hematology

## 2022-06-04 ENCOUNTER — Encounter: Payer: Self-pay | Admitting: Neurology

## 2022-06-04 DIAGNOSIS — G6289 Other specified polyneuropathies: Secondary | ICD-10-CM | POA: Diagnosis not present

## 2022-06-04 DIAGNOSIS — G8929 Other chronic pain: Secondary | ICD-10-CM

## 2022-06-04 DIAGNOSIS — R269 Unspecified abnormalities of gait and mobility: Secondary | ICD-10-CM

## 2022-06-04 DIAGNOSIS — R2689 Other abnormalities of gait and mobility: Secondary | ICD-10-CM

## 2022-06-04 DIAGNOSIS — G629 Polyneuropathy, unspecified: Secondary | ICD-10-CM | POA: Insufficient documentation

## 2022-06-04 DIAGNOSIS — R202 Paresthesia of skin: Secondary | ICD-10-CM

## 2022-06-04 DIAGNOSIS — M545 Low back pain, unspecified: Secondary | ICD-10-CM
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
## 2022-06-06 NOTE — Progress Notes (Signed)
EMg report is under procedure

## 2022-06-06 NOTE — Procedures (Signed)
Full Name: [name] Gender: Male MRN #: [id number] Date of Birth: 1945/10/08    Visit Date: 06/04/2022 08:50 Age: 76 Years Examining Physician: Dr. Marcial Pacas Referring Physician: Dr. Marcial Pacas Height: 5 feet 10 inch History: 76 year old male, complains of bilateral lower extremity paresthesia, unsteady gait, he has a history of chemotherapy for multiple myeloma  Summary of the test:  Nerve conduction study:  Right sural, superficial peroneal sensory responses were absent.  Right ulnar and radial sensory response showed normal snap amplitude.  Right median sensory response showed borderline prolonged peak latency with significantly decreased snap amplitude.  Right tibial motor responses showed moderately decreased CMAP amplitude, with mild slow conduction velocity.  Right peroneal to EDB motor response was absent.  Right ulnar motor responses showed mildly decreased CMAP amplitude was otherwise normal.  Electromyography:  Selected needle examinations of right lower extremity muscles and right lumbosacral paraspinal muscles were performed.  There is evidence of mild chronic neuropathic changes involving right L4-5 myotomes.  There is no evidence of active denervation in right lumbosacral paraspinal muscles.  Conclusion: This is an abnormal study.  There is electrodiagnostic evidence of length-dependent moderate axonal sensorimotor polyneuropathy.  In addition there is also evidence of chronic lumbosacral radiculopathy involving right L4-5 myotomes.    ------------------------------- Marcial Pacas, M.D. PhD  [facility] [address] [place], [place] [address] Tel: [phone] Fax: [phone]  Verbal informed consent was obtained from the patient, patient was informed of potential risk of procedure, including bruising, bleeding, hematoma formation, infection, muscle weakness, muscle pain, numbness, among others.        [place]    Nerve /  Sites Muscle Latency Ref. Amplitude Ref. Rel Amp Segments Distance Velocity Ref. Area    ms ms mV mV %  cm m/s m/s mVms  R Ulnar - ADM     Wrist ADM 3.1 ?3.3 5.3 ?6.0 100 Wrist - ADM 7   18.1     B.Elbow ADM 6.3  5.0  94.4 B.Elbow - Wrist 17 52 ?49 16.8     A.Elbow ADM 9.1  5.5  109 B.Elbow - ADM    21.4         A.Elbow - B.Elbow 16 56 ?49   R Peroneal - EDB     Ankle EDB NR ?6.5 NR ?2.0 NR Ankle - EDB 9   NR     Fib head EDB      Fib head - Ankle   ?44      Pop fossa EDB      Pop fossa - Fib head   ?44          Pop fossa - Ankle      R Tibial - AH     Ankle AH 4.8 ?5.8 2.8 ?4.0 100 Ankle - AH 9   6.8     Pop fossa AH 18.3  2.1  76.6 Pop fossa - Ankle 48 36 ?41 4.0           SNC    Nerve / Sites Rec. Site Peak Lat Ref.  Amp Ref. Segments Distance    ms ms V V  cm  R Median - Digit II (Antidromic)     Wrist Dig II 3.5 ?3.5 3 ?20 Wrist - Dig II 13  R Radial - Anatomical snuff box (Forearm)     Forearm Wrist 2.9 ?2.9 34 ?15 Forearm - Wrist 10  R Sural - Ankle (Calf)  Calf Ankle NR ?4.4 NR ?6 Calf - Ankle 14  R Superficial peroneal - Ankle     Lat leg Ankle NR ?4.4 NR ?6 Lat leg - Ankle 14  R Ulnar - Orthodromic, (Dig V, Mid palm)     Dig V Wrist 3.2 ?3.1 9 ?5 Dig V - Wrist 2               F  Wave    Nerve F Lat Ref.   ms ms  R Tibial - AH 70.4 ?56.0  R Ulnar - ADM 32.9 ?32.0         EMG Summary Table    Spontaneous MUAP Recruitment  Muscle IA Fib PSW Fasc Other Amp Dur. Poly Pattern  R. Tibialis anterior Normal None None None _______ Normal Normal 1+ Reduced  R. Peroneus longus Normal None None None _______ Normal Increased 1+ Reduced  R. Gastrocnemius (Medial head) Normal None None None _______ Normal Increased 1+ Reduced  R. Tibialis posterior Normal None None None _______ Normal Increased 1+ Reduced  R. Abductor hallucis Normal None None None _______ Normal Normal Normal Reduced  R. Lumbar paraspinals (low) Normal None None None _______ Normal Normal Normal Normal   R. Lumbar paraspinals (mid) Normal None None None _______ Normal Normal Normal Normal

## 2022-06-07 ENCOUNTER — Encounter: Payer: Medicare Other | Admitting: Neurology

## 2022-06-10 ENCOUNTER — Other Ambulatory Visit: Payer: Self-pay

## 2022-06-10 DIAGNOSIS — C9000 Multiple myeloma not having achieved remission: Secondary | ICD-10-CM

## 2022-06-10 MED FILL — Dexamethasone Sodium Phosphate Inj 100 MG/10ML: INTRAMUSCULAR | Qty: 1 | Status: AC

## 2022-06-11 ENCOUNTER — Inpatient Hospital Stay: Payer: Medicare Other

## 2022-06-11 ENCOUNTER — Other Ambulatory Visit: Payer: Self-pay

## 2022-06-11 VITALS — BP 144/92 | HR 76 | Temp 97.9°F | Resp 16 | Ht 70.0 in | Wt 199.2 lb

## 2022-06-11 DIAGNOSIS — C9000 Multiple myeloma not having achieved remission: Secondary | ICD-10-CM

## 2022-06-11 DIAGNOSIS — Z7189 Other specified counseling: Secondary | ICD-10-CM

## 2022-06-11 DIAGNOSIS — Z95828 Presence of other vascular implants and grafts: Secondary | ICD-10-CM

## 2022-06-11 DIAGNOSIS — Z5112 Encounter for antineoplastic immunotherapy: Secondary | ICD-10-CM | POA: Diagnosis not present

## 2022-06-11 LAB — CMP (CANCER CENTER ONLY)
ALT: 16 U/L (ref 0–44)
AST: 24 U/L (ref 15–41)
Albumin: 4.1 g/dL (ref 3.5–5.0)
Alkaline Phosphatase: 61 U/L (ref 38–126)
Anion gap: 6 (ref 5–15)
BUN: 14 mg/dL (ref 8–23)
CO2: 26 mmol/L (ref 22–32)
Calcium: 9.5 mg/dL (ref 8.9–10.3)
Chloride: 108 mmol/L (ref 98–111)
Creatinine: 1.44 mg/dL — ABNORMAL HIGH (ref 0.61–1.24)
GFR, Estimated: 51 mL/min — ABNORMAL LOW (ref >60)
Glucose, Bld: 119 mg/dL — ABNORMAL HIGH (ref 70–99)
Potassium: 3.9 mmol/L (ref 3.5–5.1)
Sodium: 140 mmol/L (ref 135–145)
Total Bilirubin: 0.6 mg/dL (ref 0.3–1.2)
Total Protein: 5.8 g/dL — ABNORMAL LOW (ref 6.5–8.1)

## 2022-06-11 LAB — CBC WITH DIFFERENTIAL (CANCER CENTER ONLY)
Abs Immature Granulocytes: 0.01 10*3/uL (ref 0.00–0.07)
Basophils Absolute: 0 10*3/uL (ref 0.0–0.1)
Basophils Relative: 1 %
Eosinophils Absolute: 0.1 10*3/uL (ref 0.0–0.5)
Eosinophils Relative: 4 %
HCT: 34.4 % — ABNORMAL LOW (ref 39.0–52.0)
Hemoglobin: 11.9 g/dL — ABNORMAL LOW (ref 13.0–17.0)
Immature Granulocytes: 0 %
Lymphocytes Relative: 33 %
Lymphs Abs: 1.3 10*3/uL (ref 0.7–4.0)
MCH: 36.6 pg — ABNORMAL HIGH (ref 26.0–34.0)
MCHC: 34.6 g/dL (ref 30.0–36.0)
MCV: 105.8 fL — ABNORMAL HIGH (ref 80.0–100.0)
Monocytes Absolute: 0.4 10*3/uL (ref 0.1–1.0)
Monocytes Relative: 9 %
Neutro Abs: 2 10*3/uL (ref 1.7–7.7)
Neutrophils Relative %: 53 %
Platelet Count: 181 10*3/uL (ref 150–400)
RBC: 3.25 MIL/uL — ABNORMAL LOW (ref 4.22–5.81)
RDW: 13.2 % (ref 11.5–15.5)
WBC Count: 3.8 10*3/uL — ABNORMAL LOW (ref 4.0–10.5)
nRBC: 0 % (ref 0.0–0.2)

## 2022-06-11 MED ORDER — SODIUM CHLORIDE 0.9 % IV SOLN: Freq: Once | INTRAVENOUS | Status: AC

## 2022-06-11 MED ORDER — ACETAMINOPHEN 500 MG PO TABS
1000.0000 mg | ORAL_TABLET | Freq: Once | ORAL | Status: AC
  Administered 2022-06-11: 1000 mg via ORAL
  Filled 2022-06-11: qty 2

## 2022-06-11 MED ORDER — SODIUM CHLORIDE 0.9 % IV SOLN
10.0000 mg | Freq: Once | INTRAVENOUS | Status: AC
  Administered 2022-06-11: 10 mg via INTRAVENOUS
  Filled 2022-06-11: qty 10

## 2022-06-11 MED ORDER — SODIUM CHLORIDE 0.9% FLUSH
10.0000 mL | INTRAVENOUS | Status: DC | PRN
  Administered 2022-06-11: 10 mL

## 2022-06-11 MED ORDER — SODIUM CHLORIDE 0.9% FLUSH
10.0000 mL | Freq: Once | INTRAVENOUS | Status: AC
  Administered 2022-06-11: 10 mL

## 2022-06-11 MED ORDER — PROCHLORPERAZINE MALEATE 10 MG PO TABS
10.0000 mg | ORAL_TABLET | Freq: Once | ORAL | Status: AC
  Administered 2022-06-11: 10 mg via ORAL
  Filled 2022-06-11: qty 1

## 2022-06-11 MED ORDER — HEPARIN SOD (PORK) LOCK FLUSH 100 UNIT/ML IV SOLN
500.0000 [IU] | Freq: Once | INTRAVENOUS | Status: AC | PRN
  Administered 2022-06-11: 500 [IU]

## 2022-06-11 MED ORDER — DEXTROSE 5 % IV SOLN
56.0000 mg/m2 | Freq: Once | INTRAVENOUS | Status: AC
  Administered 2022-06-11: 120 mg via INTRAVENOUS
  Filled 2022-06-11: qty 60

## 2022-06-11 NOTE — Progress Notes (Signed)
Pt. states he does not have time for [place] today.

## 2022-06-11 NOTE — Patient Instructions (Signed)
[facility]  Discharge Instructions: Thank you for choosing [facility] to provide your oncology and hematology care.   If you have a lab appointment with the [facility], please go directly to the [facility] and check in at the registration area.   Wear comfortable clothing and clothing appropriate for easy access to any Portacath or PICC line.   We strive to give you quality time with your provider. You may need to reschedule your appointment if you arrive late (15 or more minutes).  Arriving late affects you and other patients whose appointments are after yours.  Also, if you miss three or more appointments without notifying the office, you may be dismissed from the clinic at the provider's discretion.      For prescription refill requests, have your pharmacy contact our office and allow 72 hours for refills to be completed.    Today you received the following chemotherapy and/or immunotherapy agent: Kyprolis   To help prevent nausea and vomiting after your treatment, we encourage you to take your nausea medication as directed.  BELOW ARE SYMPTOMS THAT SHOULD BE REPORTED IMMEDIATELY: *FEVER GREATER THAN 100.4 F (38 C) OR HIGHER *CHILLS OR SWEATING *NAUSEA AND VOMITING THAT IS NOT CONTROLLED WITH YOUR NAUSEA MEDICATION *UNUSUAL SHORTNESS OF BREATH *UNUSUAL BRUISING OR BLEEDING *URINARY PROBLEMS (pain or burning when urinating, or frequent urination) *BOWEL PROBLEMS (unusual diarrhea, constipation, pain near the anus) TENDERNESS IN MOUTH AND THROAT WITH OR WITHOUT PRESENCE OF ULCERS (sore throat, sores in mouth, or a toothache) UNUSUAL RASH, SWELLING OR PAIN  UNUSUAL VAGINAL DISCHARGE OR ITCHING   Items with * indicate a potential emergency and should be followed up as soon as possible or go to the Emergency Department if any problems should occur.  Please show the CHEMOTHERAPY ALERT CARD or IMMUNOTHERAPY ALERT CARD at check-in to the  Emergency Department and triage nurse.  Should you have questions after your visit or need to cancel or reschedule your appointment, please contact [facility]  Dept: [phone]  and follow the prompts.  Office hours are 8:00 a.m. to 4:30 p.m. Monday - Friday. Please note that voicemails left after 4:00 p.m. may not be returned until the following business day.  We are closed weekends and major holidays. You have access to a nurse at all times for urgent questions. Please call the main number to the clinic Dept: [phone] and follow the prompts.   For any non-urgent questions, you may also contact your provider using MyChart. We now offer e-Visits for anyone 49 and older to request care online for non-urgent symptoms. For details visit [email or web address].   Also download the MyChart app! Go to the app store, search "MyChart", open the app, select [facility], and log in with your MyChart username and password.  Masks are optional in the cancer centers. If you would like for your care team to wear a mask while they are taking care of you, please let them know. You may have one support person who is at least 75 years old accompany you for your appointments. Carfilzomib Injection What is this medication? CARFILZOMIB (kar FILZ oh mib) treats multiple myeloma, a type of bone marrow cancer. It works by blocking a protein that causes cancer cells to grow and multiply. This helps to slow or stop the spread of cancer cells. This medicine may be used for other purposes; ask your health care provider or pharmacist if you have questions. COMMON  BRAND NAME(S): KYPROLIS What should I tell my care team before I take this medication? They need to know if you have any of these conditions: Heart disease History of blood clots Irregular heartbeat Kidney disease Liver disease Lung or breathing disease An unusual or allergic reaction to carfilzomib, or other medications,  foods, dyes, or preservatives If you or your partner are pregnant or trying to get pregnant Breastfeeding How should I use this medication? This medication is injected into a vein. It is given by your care team in a hospital or clinic setting. Talk to your care team about the use of this medication in children. Special care may be needed. Overdosage: If you think you have taken too much of this medicine contact a poison control center or emergency room at once. NOTE: This medicine is only for you. Do not share this medicine with others. What if I miss a dose? Keep appointments for follow-up doses. It is important not to miss your dose. Call your care team if you are unable to keep an appointment. What may interact with this medication? Interactions are not expected. This list may not describe all possible interactions. Give your health care provider a list of all the medicines, herbs, non-prescription drugs, or dietary supplements you use. Also tell them if you smoke, drink alcohol, or use illegal drugs. Some items may interact with your medicine. What should I watch for while using this medication? Your condition will be monitored carefully while you are receiving this medication. You may need blood work while taking this medication. Check with your care team if you have severe diarrhea, nausea, and vomiting, or if you sweat a lot. The loss of too much body fluid may make it dangerous for you to take this medication. This medication may affect your coordination, reaction time, or judgment. Do not drive or operate machinery until you know how this medication affects you. Sit up or stand slowly to reduce the risk of dizzy or fainting spells. Drinking alcohol with this medication can increase the risk of these side effects. Talk to your care team if you may be pregnant. Serious birth defects can occur if you take this medication during pregnancy and for 6 months after the last dose. You will need a  negative pregnancy test before starting this medication. Contraception is recommended while taking this medication and for 6 months after the last dose. Your care team can help you find an option that works for you. If your partner can get pregnant, use a condom during sex while taking this medication and for 3 months after the last dose. Do not breastfeed while taking this medication and for 2 weeks after the last dose. This medication may cause infertility. Talk to your care team if you are concerned about your fertility. What side effects may I notice from receiving this medication? Side effects that you should report to your care team as soon as possible: Allergic reactions--skin rash, itching, hives, swelling of the face, lips, tongue, or throat Bleeding--bloody or black, tar-like stools, vomiting blood or brown material that looks like coffee grounds, red or dark brown urine, small red or purple spots on skin, unusual bruising or bleeding Blood clot--pain, swelling, or warmth in the leg, shortness of breath, chest pain Dizziness, loss of balance or coordination, confusion or trouble speaking Heart attack--pain or tightness in the chest, shoulders, arms, or jaw, nausea, shortness of breath, cold or clammy skin, feeling faint or lightheaded Heart failure--shortness of breath, swelling of the  ankles, feet, or hands, sudden weight gain, unusual weakness or fatigue Heart rhythm changes--fast or irregular heartbeat, dizziness, feeling faint or lightheaded, chest pain, trouble breathing Increase in blood pressure Infection--fever, chills, cough, sore throat, wounds that don't heal, pain or trouble when passing urine, general feeling of discomfort or being unwell Infusion reactions--chest pain, shortness of breath or trouble breathing, feeling faint or lightheaded Kidney injury--decrease in the amount of urine, swelling of the ankles, hands, or feet Liver injury--right upper belly pain, loss of  appetite, nausea, light-colored stool, dark yellow or brown urine, yellowing skin or eyes, unusual weakness or fatigue Lung injury--shortness of breath or trouble breathing, cough, spitting up blood, chest pain, fever Pulmonary hypertension--shortness of breath, chest pain, fast or irregular heartbeat, feeling faint or lightheaded, fatigue, swelling of the ankles or feet Stomach pain, bloody diarrhea, pale skin, unusual weakness or fatigue, decrease in the amount of urine, which may be signs of hemolytic uremic syndrome Sudden and severe headache, confusion, change in vision, seizures, which may be signs of posterior reversible encephalopathy syndrome (PRES) TTP--purple spots on the skin or inside the mouth, pale skin, yellowing skin or eyes, unusual weakness or fatigue, fever, fast or irregular heartbeat, confusion, change in vision, trouble speaking, trouble walking Tumor lysis syndrome (TLS)--nausea, vomiting, diarrhea, decrease in the amount of urine, dark urine, unusual weakness or fatigue, confusion, muscle pain or cramps, fast or irregular heartbeat, joint pain Side effects that usually do not require medical attention (report to your care team if they continue or are bothersome): Diarrhea Fatigue Nausea Trouble sleeping This list may not describe all possible side effects. Call your doctor for medical advice about side effects. You may report side effects to FDA at 1-800-FDA-1088. Where should I keep my medication? This medication is given in a hospital or clinic. It will not be stored at home. NOTE: This sheet is a summary. It may not cover all possible information. If you have questions about this medicine, talk to your doctor, pharmacist, or health care provider.  2023 Elsevier/Gold Standard (2021-12-05 00:00:00)

## 2022-06-16 ENCOUNTER — Encounter: Payer: Self-pay | Admitting: Hematology

## 2022-06-17 ENCOUNTER — Other Ambulatory Visit: Payer: Self-pay

## 2022-06-17 DIAGNOSIS — C9001 Multiple myeloma in remission: Secondary | ICD-10-CM

## 2022-06-17 LAB — MULTIPLE MYELOMA PANEL, SERUM
Albumin SerPl Elph-Mcnc: 3.5 g/dL (ref 2.9–4.4)
Albumin/Glob SerPl: 1.9 — ABNORMAL HIGH (ref 0.7–1.7)
Alpha 1: 0.2 g/dL (ref 0.0–0.4)
Alpha2 Glob SerPl Elph-Mcnc: 0.7 g/dL (ref 0.4–1.0)
B-Globulin SerPl Elph-Mcnc: 0.8 g/dL (ref 0.7–1.3)
Gamma Glob SerPl Elph-Mcnc: 0.2 g/dL — ABNORMAL LOW (ref 0.4–1.8)
Globulin, Total: 1.9 g/dL — ABNORMAL LOW (ref 2.2–3.9)
IgA: 19 mg/dL — ABNORMAL LOW (ref 61–437)
IgG (Immunoglobin G), Serum: 215 mg/dL — ABNORMAL LOW (ref 603–1613)
IgM (Immunoglobulin M), Srm: 8 mg/dL — ABNORMAL LOW (ref 15–143)
Total Protein ELP: 5.4 g/dL — ABNORMAL LOW (ref 6.0–8.5)

## 2022-06-17 MED ORDER — TRAMADOL HCL 50 MG PO TABS: 50.0000 mg | ORAL_TABLET | Freq: Four times a day (QID) | ORAL | 0 refills | Status: DC | PRN

## 2022-06-18 ENCOUNTER — Other Ambulatory Visit: Payer: Self-pay | Admitting: Internal Medicine

## 2022-06-18 ENCOUNTER — Encounter: Payer: Self-pay | Admitting: Internal Medicine

## 2022-06-18 MED ORDER — TRAMADOL HCL 50 MG PO TABS: 50.0000 mg | ORAL_TABLET | Freq: Four times a day (QID) | ORAL | 0 refills | Status: DC | PRN

## 2022-06-19 ENCOUNTER — Other Ambulatory Visit: Payer: Self-pay | Admitting: Hematology

## 2022-06-19 DIAGNOSIS — C9001 Multiple myeloma in remission: Secondary | ICD-10-CM

## 2022-06-19 MED ORDER — TRAMADOL HCL 50 MG PO TABS: 50.0000 mg | ORAL_TABLET | Freq: Four times a day (QID) | ORAL | 0 refills | Status: DC | PRN

## 2022-06-20 ENCOUNTER — Other Ambulatory Visit: Payer: Self-pay | Admitting: Internal Medicine

## 2022-06-24 ENCOUNTER — Other Ambulatory Visit: Payer: Self-pay

## 2022-06-24 DIAGNOSIS — C9001 Multiple myeloma in remission: Secondary | ICD-10-CM

## 2022-06-24 MED FILL — Dexamethasone Sodium Phosphate Inj 100 MG/10ML: INTRAMUSCULAR | Qty: 1 | Status: AC

## 2022-06-25 ENCOUNTER — Inpatient Hospital Stay (HOSPITAL_BASED_OUTPATIENT_CLINIC_OR_DEPARTMENT_OTHER): Payer: Medicare Other | Admitting: Hematology

## 2022-06-25 ENCOUNTER — Inpatient Hospital Stay: Payer: Medicare Other | Attending: Hematology

## 2022-06-25 ENCOUNTER — Inpatient Hospital Stay: Payer: Medicare Other

## 2022-06-25 VITALS — BP 149/82 | HR 69 | Temp 97.8°F | Resp 16

## 2022-06-25 VITALS — BP 131/81 | HR 66 | Temp 97.9°F | Resp 18 | Ht 70.0 in | Wt 196.7 lb

## 2022-06-25 DIAGNOSIS — Z7189 Other specified counseling: Secondary | ICD-10-CM

## 2022-06-25 DIAGNOSIS — Z5112 Encounter for antineoplastic immunotherapy: Secondary | ICD-10-CM | POA: Insufficient documentation

## 2022-06-25 DIAGNOSIS — Z79899 Other long term (current) drug therapy: Secondary | ICD-10-CM | POA: Insufficient documentation

## 2022-06-25 DIAGNOSIS — Z5111 Encounter for antineoplastic chemotherapy: Secondary | ICD-10-CM | POA: Diagnosis not present

## 2022-06-25 DIAGNOSIS — C9001 Multiple myeloma in remission: Secondary | ICD-10-CM

## 2022-06-25 DIAGNOSIS — C9000 Multiple myeloma not having achieved remission: Secondary | ICD-10-CM

## 2022-06-25 DIAGNOSIS — Z95828 Presence of other vascular implants and grafts: Secondary | ICD-10-CM

## 2022-06-25 LAB — CBC WITH DIFFERENTIAL (CANCER CENTER ONLY)
Abs Immature Granulocytes: 0.01 10*3/uL (ref 0.00–0.07)
Basophils Absolute: 0 10*3/uL (ref 0.0–0.1)
Basophils Relative: 1 %
Eosinophils Absolute: 0.1 10*3/uL (ref 0.0–0.5)
Eosinophils Relative: 2 %
HCT: 35.5 % — ABNORMAL LOW (ref 39.0–52.0)
Hemoglobin: 12.3 g/dL — ABNORMAL LOW (ref 13.0–17.0)
Immature Granulocytes: 0 %
Lymphocytes Relative: 28 %
Lymphs Abs: 1.2 10*3/uL (ref 0.7–4.0)
MCH: 37.2 pg — ABNORMAL HIGH (ref 26.0–34.0)
MCHC: 34.6 g/dL (ref 30.0–36.0)
MCV: 107.3 fL — ABNORMAL HIGH (ref 80.0–100.0)
Monocytes Absolute: 0.4 10*3/uL (ref 0.1–1.0)
Monocytes Relative: 9 %
Neutro Abs: 2.5 10*3/uL (ref 1.7–7.7)
Neutrophils Relative %: 60 %
Platelet Count: 171 10*3/uL (ref 150–400)
RBC: 3.31 MIL/uL — ABNORMAL LOW (ref 4.22–5.81)
RDW: 13.4 % (ref 11.5–15.5)
WBC Count: 4.2 10*3/uL (ref 4.0–10.5)
nRBC: 0 % (ref 0.0–0.2)

## 2022-06-25 LAB — CMP (CANCER CENTER ONLY)
ALT: 14 U/L (ref 0–44)
AST: 24 U/L (ref 15–41)
Albumin: 4.1 g/dL (ref 3.5–5.0)
Alkaline Phosphatase: 56 U/L (ref 38–126)
Anion gap: 6 (ref 5–15)
BUN: 21 mg/dL (ref 8–23)
CO2: 26 mmol/L (ref 22–32)
Calcium: 9.8 mg/dL (ref 8.9–10.3)
Chloride: 107 mmol/L (ref 98–111)
Creatinine: 1.51 mg/dL — ABNORMAL HIGH (ref 0.61–1.24)
GFR, Estimated: 48 mL/min — ABNORMAL LOW (ref >60)
Glucose, Bld: 139 mg/dL — ABNORMAL HIGH (ref 70–99)
Potassium: 4.5 mmol/L (ref 3.5–5.1)
Sodium: 139 mmol/L (ref 135–145)
Total Bilirubin: 0.7 mg/dL (ref 0.3–1.2)
Total Protein: 6 g/dL — ABNORMAL LOW (ref 6.5–8.1)

## 2022-06-25 MED ORDER — SODIUM CHLORIDE 0.9 % IV SOLN: Freq: Once | INTRAVENOUS | Status: AC

## 2022-06-25 MED ORDER — HEPARIN SOD (PORK) LOCK FLUSH 100 UNIT/ML IV SOLN
500.0000 [IU] | Freq: Once | INTRAVENOUS | Status: AC | PRN
  Administered 2022-06-25: 500 [IU]

## 2022-06-25 MED ORDER — SODIUM CHLORIDE 0.9 % IV SOLN
60.0000 mg | Freq: Once | INTRAVENOUS | Status: AC
  Administered 2022-06-25: 60 mg via INTRAVENOUS
  Filled 2022-06-25: qty 10

## 2022-06-25 MED ORDER — ACETAMINOPHEN 500 MG PO TABS
1000.0000 mg | ORAL_TABLET | Freq: Once | ORAL | Status: AC
  Administered 2022-06-25: 1000 mg via ORAL
  Filled 2022-06-25: qty 2

## 2022-06-25 MED ORDER — PROCHLORPERAZINE MALEATE 10 MG PO TABS
10.0000 mg | ORAL_TABLET | Freq: Once | ORAL | Status: AC
  Administered 2022-06-25: 10 mg via ORAL
  Filled 2022-06-25: qty 1

## 2022-06-25 MED ORDER — SODIUM CHLORIDE 0.9 % IV SOLN
10.0000 mg | Freq: Once | INTRAVENOUS | Status: AC
  Administered 2022-06-25: 10 mg via INTRAVENOUS
  Filled 2022-06-25: qty 10

## 2022-06-25 MED ORDER — DEXTROSE 5 % IV SOLN
56.0000 mg/m2 | Freq: Once | INTRAVENOUS | Status: AC
  Administered 2022-06-25: 120 mg via INTRAVENOUS
  Filled 2022-06-25: qty 60

## 2022-06-25 MED ORDER — SODIUM CHLORIDE 0.9% FLUSH
10.0000 mL | INTRAVENOUS | Status: DC | PRN
  Administered 2022-06-25: 10 mL

## 2022-06-25 NOTE — Progress Notes (Signed)
Per Irene Limbo MD, ok to treat with SCR 1.5

## 2022-06-25 NOTE — Patient Instructions (Signed)
[facility]  Discharge Instructions: Thank you for choosing [facility] to provide your oncology and hematology care.   If you have a lab appointment with the [facility], please go directly to the [facility] and check in at the registration area.   Wear comfortable clothing and clothing appropriate for easy access to any Portacath or PICC line.   We strive to give you quality time with your provider. You may need to reschedule your appointment if you arrive late (15 or more minutes).  Arriving late affects you and other patients whose appointments are after yours.  Also, if you miss three or more appointments without notifying the office, you may be dismissed from the clinic at the provider's discretion.      For prescription refill requests, have your pharmacy contact our office and allow 72 hours for refills to be completed.    Today you received the following chemotherapy and/or immunotherapy agents: Kyprolis.      To help prevent nausea and vomiting after your treatment, we encourage you to take your nausea medication as directed.  BELOW ARE SYMPTOMS THAT SHOULD BE REPORTED IMMEDIATELY: *FEVER GREATER THAN 100.4 F (38 C) OR HIGHER *CHILLS OR SWEATING *NAUSEA AND VOMITING THAT IS NOT CONTROLLED WITH YOUR NAUSEA MEDICATION *UNUSUAL SHORTNESS OF BREATH *UNUSUAL BRUISING OR BLEEDING *URINARY PROBLEMS (pain or burning when urinating, or frequent urination) *BOWEL PROBLEMS (unusual diarrhea, constipation, pain near the anus) TENDERNESS IN MOUTH AND THROAT WITH OR WITHOUT PRESENCE OF ULCERS (sore throat, sores in mouth, or a toothache) UNUSUAL RASH, SWELLING OR PAIN  UNUSUAL VAGINAL DISCHARGE OR ITCHING   Items with * indicate a potential emergency and should be followed up as soon as possible or go to the Emergency Department if any problems should occur.  Please show the CHEMOTHERAPY ALERT CARD or IMMUNOTHERAPY ALERT CARD at check-in to  the Emergency Department and triage nurse.  Should you have questions after your visit or need to cancel or reschedule your appointment, please contact [facility]  Dept: [phone]  and follow the prompts.  Office hours are 8:00 a.m. to 4:30 p.m. Monday - Friday. Please note that voicemails left after 4:00 p.m. may not be returned until the following business day.  We are closed weekends and major holidays. You have access to a nurse at all times for urgent questions. Please call the main number to the clinic Dept: [phone] and follow the prompts.   For any non-urgent questions, you may also contact your provider using MyChart. We now offer e-Visits for anyone 75 and older to request care online for non-urgent symptoms. For details visit [email or web address].   Also download the MyChart app! Go to the app store, search "MyChart", open the app, select [facility], and log in with your MyChart username and password.  Masks are optional in the cancer centers. If you would like for your care team to wear a mask while they are taking care of you, please let them know. You may have one support person who is at least 75 years old accompany you for your appointments. Pamidronate Injection What is this medication? PAMIDRONATE (pa mi DROE nate) treats high calcium levels in the blood caused by cancer. It may also be used with chemotherapy to treat weakened bones caused by cancer. It can also be used to treat Paget's disease of the bone. It works by slowing down the release of calcium from bones. This lowers calcium levels  in your blood. It also makes your bones stronger and less likely to break (fracture). It belongs to a group of medications called bisphosphonates. This medicine may be used for other purposes; ask your health care provider or pharmacist if you have questions. COMMON BRAND NAME(S): Aredia What should I tell my care team before I take this  medication? They need to know if you have any of these conditions: Bleeding disorder Cancer Dental disease Kidney disease Low levels of calcium or other minerals in the blood Low red blood cell counts Receiving steroids, such as dexamethasone or prednisone An unusual or allergic reaction to pamidronate, other medications, foods, dyes or preservatives Pregnant or trying to get pregnant Breast-feeding How should I use this medication? This medication is injected into a vein. It is given by your care team in a hospital or clinic setting. Talk to your care team about the use of this medication in children. Special care may be needed. Overdosage: If you think you have taken too much of this medicine contact a poison control center or emergency room at once. NOTE: This medicine is only for you. Do not share this medicine with others. What if I miss a dose? Keep appointments for follow-up doses. It is important not to miss your dose. Call your care team if you are unable to keep an appointment. What may interact with this medication? Certain antibiotics given by injection Medications for inflammation or pain, such as ibuprofen, naproxen Some diuretics, such as bumetanide, furosemide Cyclosporine Parathyroid hormone Tacrolimus Teriparatide Thalidomide This list may not describe all possible interactions. Give your health care provider a list of all the medicines, herbs, non-prescription drugs, or dietary supplements you use. Also tell them if you smoke, drink alcohol, or use illegal drugs. Some items may interact with your medicine. What should I watch for while using this medication? Visit your care team for regular checks on your progress. It may be some time before you see the benefit from this medication. Some people who take this medication have severe bone, joint, or muscle pain. This medication may also increase your risk for jaw problems or a broken thigh bone. Tell your care team  right away if you have severe pain in your jaw, bones, joints, or muscles. Tell your care team if you have any pain that does not go away or that gets worse. Tell your dentist and dental surgeon that you are taking this medication. You should not have major dental surgery while on this medication. See your dentist to have a dental exam and fix any dental problems before starting this medication. Take good care of your teeth while on this medication. Make sure you see your dentist for regular follow-up appointments. You should make sure you get enough calcium and vitamin D while you are taking this medication. Discuss the foods you eat and the vitamins you take with your care team. You may need bloodwork while you are taking this medication. Talk to your care team if you wish to become pregnant or think you might be pregnant. This medication can cause serious birth defects. What side effects may I notice from receiving this medication? Side effects that you should report to your care team as soon as possible: Allergic reactions--skin rash, itching, hives, swelling of the face, lips, tongue, or throat Kidney injury--decrease in the amount of urine, swelling of the ankles, hands, or feet Low calcium level--muscle pain or cramps, confusion, tingling, or numbness in the hands or feet Osteonecrosis of the  jaw--pain, swelling, or redness in the mouth, numbness of the jaw, poor healing after dental work, unusual discharge from the mouth, visible bones in the mouth Severe bone, joint, or muscle pain Side effects that usually do not require medical attention (report to your care team if they continue or are bothersome): Constipation Fatigue Fever Loss of appetite Nausea Pain, redness, or irritation at injection site Stomach pain This list may not describe all possible side effects. Call your doctor for medical advice about side effects. You may report side effects to FDA at 1-800-FDA-1088. Where should I  keep my medication? This medication is given in a hospital or clinic. It will not be stored at home. NOTE: This sheet is a summary. It may not cover all possible information. If you have questions about this medicine, talk to your doctor, pharmacist, or health care provider.  2023 Elsevier/Gold Standard (2021-08-23 00:00:00)
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
## 2022-06-25 NOTE — Progress Notes (Signed)
Patient seen by MD today  Vitals are within treatment parameters.  Labs reviewed: and are within treatment parameters.  Per physician team, patient is ready for treatment and there are NO modifications to the treatment plan.  

## 2022-06-28 ENCOUNTER — Other Ambulatory Visit: Payer: Self-pay

## 2022-06-28 ENCOUNTER — Other Ambulatory Visit: Payer: Medicare Other

## 2022-06-28 DIAGNOSIS — Z8719 Personal history of other diseases of the digestive system: Secondary | ICD-10-CM

## 2022-06-28 DIAGNOSIS — Z9484 Stem cells transplant status: Secondary | ICD-10-CM

## 2022-06-28 DIAGNOSIS — F32A Depression, unspecified: Secondary | ICD-10-CM

## 2022-06-28 DIAGNOSIS — Z7901 Long term (current) use of anticoagulants: Secondary | ICD-10-CM

## 2022-06-28 DIAGNOSIS — C9000 Multiple myeloma not having achieved remission: Secondary | ICD-10-CM

## 2022-06-28 DIAGNOSIS — M545 Low back pain, unspecified: Secondary | ICD-10-CM

## 2022-06-28 DIAGNOSIS — I679 Cerebrovascular disease, unspecified: Secondary | ICD-10-CM

## 2022-06-28 DIAGNOSIS — I10 Essential (primary) hypertension: Secondary | ICD-10-CM

## 2022-06-28 DIAGNOSIS — R202 Paresthesia of skin: Secondary | ICD-10-CM

## 2022-06-28 DIAGNOSIS — Z125 Encounter for screening for malignant neoplasm of prostate: Secondary | ICD-10-CM

## 2022-06-29 ENCOUNTER — Encounter: Payer: Self-pay | Admitting: Hematology

## 2022-06-29 ENCOUNTER — Other Ambulatory Visit: Payer: Self-pay | Admitting: Hematology

## 2022-06-29 DIAGNOSIS — C9000 Multiple myeloma not having achieved remission: Secondary | ICD-10-CM

## 2022-06-29 LAB — COMPLETE METABOLIC PANEL WITH GFR
AG Ratio: 2.2 (calc) (ref 1.0–2.5)
ALT: 23 U/L (ref 9–46)
AST: 24 U/L (ref 10–35)
Albumin: 4 g/dL (ref 3.6–5.1)
Alkaline phosphatase (APISO): 66 U/L (ref 35–144)
BUN/Creatinine Ratio: 14 (calc) (ref 6–22)
BUN: 21 mg/dL (ref 7–25)
CO2: 28 mmol/L (ref 20–32)
Calcium: 9.3 mg/dL (ref 8.6–10.3)
Chloride: 106 mmol/L (ref 98–110)
Creat: 1.52 mg/dL — ABNORMAL HIGH (ref 0.70–1.28)
Globulin: 1.8 g/dL (calc) — ABNORMAL LOW (ref 1.9–3.7)
Glucose, Bld: 89 mg/dL (ref 65–99)
Potassium: 4.4 mmol/L (ref 3.5–5.3)
Sodium: 141 mmol/L (ref 135–146)
Total Bilirubin: 0.8 mg/dL (ref 0.2–1.2)
Total Protein: 5.8 g/dL — ABNORMAL LOW (ref 6.1–8.1)
eGFR: 47 mL/min/{1.73_m2} — ABNORMAL LOW (ref >=60)

## 2022-06-29 LAB — PSA: PSA: 0.55 ng/mL (ref <=4.00)

## 2022-06-29 LAB — LIPID PANEL
Cholesterol: 184 mg/dL (ref <200)
HDL: 75 mg/dL (ref >=40)
LDL Cholesterol (Calc): 86 mg/dL (calc)
Non-HDL Cholesterol (Calc): 109 mg/dL (calc) (ref <130)
Total CHOL/HDL Ratio: 2.5 (calc) (ref <5.0)
Triglycerides: 131 mg/dL (ref <150)

## 2022-06-30 ENCOUNTER — Encounter: Payer: Self-pay | Admitting: Hematology

## 2022-07-01 ENCOUNTER — Encounter: Payer: Self-pay | Admitting: Internal Medicine

## 2022-07-01 ENCOUNTER — Encounter: Payer: Self-pay | Admitting: Hematology

## 2022-07-01 ENCOUNTER — Ambulatory Visit (INDEPENDENT_AMBULATORY_CARE_PROVIDER_SITE_OTHER): Payer: Medicare Other | Admitting: Internal Medicine

## 2022-07-01 ENCOUNTER — Other Ambulatory Visit: Payer: Self-pay

## 2022-07-01 VITALS — BP 112/74 | HR 83 | Temp 98.3°F | Ht 70.0 in | Wt 199.0 lb

## 2022-07-01 DIAGNOSIS — G4733 Obstructive sleep apnea (adult) (pediatric): Secondary | ICD-10-CM

## 2022-07-01 DIAGNOSIS — Z9484 Stem cells transplant status: Secondary | ICD-10-CM

## 2022-07-01 DIAGNOSIS — Z8659 Personal history of other mental and behavioral disorders: Secondary | ICD-10-CM

## 2022-07-01 DIAGNOSIS — G620 Drug-induced polyneuropathy: Secondary | ICD-10-CM

## 2022-07-01 DIAGNOSIS — R0602 Shortness of breath: Secondary | ICD-10-CM | POA: Diagnosis not present

## 2022-07-01 DIAGNOSIS — G4700 Insomnia, unspecified: Secondary | ICD-10-CM

## 2022-07-01 DIAGNOSIS — Z7901 Long term (current) use of anticoagulants: Secondary | ICD-10-CM

## 2022-07-01 DIAGNOSIS — Z0000 Encounter for general adult medical examination without abnormal findings: Secondary | ICD-10-CM

## 2022-07-01 DIAGNOSIS — N183 Chronic kidney disease, stage 3 unspecified: Secondary | ICD-10-CM

## 2022-07-01 DIAGNOSIS — R351 Nocturia: Secondary | ICD-10-CM

## 2022-07-01 DIAGNOSIS — Z86718 Personal history of other venous thrombosis and embolism: Secondary | ICD-10-CM

## 2022-07-01 DIAGNOSIS — Z8579 Personal history of other malignant neoplasms of lymphoid, hematopoietic and related tissues: Secondary | ICD-10-CM

## 2022-07-01 DIAGNOSIS — N401 Enlarged prostate with lower urinary tract symptoms: Secondary | ICD-10-CM

## 2022-07-01 DIAGNOSIS — S60511A Abrasion of right hand, initial encounter: Secondary | ICD-10-CM

## 2022-07-01 LAB — POCT URINALYSIS DIPSTICK
Bilirubin, UA: NEGATIVE
Blood, UA: NEGATIVE
Glucose, UA: NEGATIVE
Ketones, UA: NEGATIVE
Leukocytes, UA: NEGATIVE
Nitrite, UA: NEGATIVE
Protein, UA: NEGATIVE
Spec Grav, UA: 1.01 (ref 1.010–1.025)
Urobilinogen, UA: 0.2 E.U./dL
pH, UA: 5 (ref 5.0–8.0)

## 2022-07-01 MED ORDER — ERGOCALCIFEROL 1.25 MG (50000 UT) PO CAPS: 50000.0000 [IU] | ORAL_CAPSULE | ORAL | 3 refills | Status: DC
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
## 2022-07-08 ENCOUNTER — Other Ambulatory Visit: Payer: Self-pay

## 2022-07-08 DIAGNOSIS — C9000 Multiple myeloma not having achieved remission: Secondary | ICD-10-CM

## 2022-07-08 MED FILL — Dexamethasone Sodium Phosphate Inj 100 MG/10ML: INTRAMUSCULAR | Qty: 1 | Status: AC

## 2022-07-09 ENCOUNTER — Inpatient Hospital Stay: Payer: Medicare Other

## 2022-07-09 VITALS — BP 124/77 | HR 81 | Temp 98.2°F | Resp 17 | Wt 199.0 lb

## 2022-07-09 DIAGNOSIS — Z95828 Presence of other vascular implants and grafts: Secondary | ICD-10-CM

## 2022-07-09 DIAGNOSIS — Z5112 Encounter for antineoplastic immunotherapy: Secondary | ICD-10-CM | POA: Diagnosis not present

## 2022-07-09 DIAGNOSIS — Z7189 Other specified counseling: Secondary | ICD-10-CM

## 2022-07-09 DIAGNOSIS — C9000 Multiple myeloma not having achieved remission: Secondary | ICD-10-CM

## 2022-07-09 LAB — CMP (CANCER CENTER ONLY)
ALT: 19 U/L (ref 0–44)
AST: 25 U/L (ref 15–41)
Albumin: 4.2 g/dL (ref 3.5–5.0)
Alkaline Phosphatase: 62 U/L (ref 38–126)
Anion gap: 7 (ref 5–15)
BUN: 18 mg/dL (ref 8–23)
CO2: 25 mmol/L (ref 22–32)
Calcium: 9.5 mg/dL (ref 8.9–10.3)
Chloride: 109 mmol/L (ref 98–111)
Creatinine: 1.53 mg/dL — ABNORMAL HIGH (ref 0.61–1.24)
GFR, Estimated: 47 mL/min — ABNORMAL LOW (ref >60)
Glucose, Bld: 106 mg/dL — ABNORMAL HIGH (ref 70–99)
Potassium: 4.1 mmol/L (ref 3.5–5.1)
Sodium: 141 mmol/L (ref 135–145)
Total Bilirubin: 0.9 mg/dL (ref 0.3–1.2)
Total Protein: 6.5 g/dL (ref 6.5–8.1)

## 2022-07-09 LAB — CBC WITH DIFFERENTIAL (CANCER CENTER ONLY)
Abs Immature Granulocytes: 0.01 10*3/uL (ref 0.00–0.07)
Basophils Absolute: 0.1 10*3/uL (ref 0.0–0.1)
Basophils Relative: 1 %
Eosinophils Absolute: 0.2 10*3/uL (ref 0.0–0.5)
Eosinophils Relative: 3 %
HCT: 34.2 % — ABNORMAL LOW (ref 39.0–52.0)
Hemoglobin: 12 g/dL — ABNORMAL LOW (ref 13.0–17.0)
Immature Granulocytes: 0 %
Lymphocytes Relative: 30 %
Lymphs Abs: 1.4 10*3/uL (ref 0.7–4.0)
MCH: 36.8 pg — ABNORMAL HIGH (ref 26.0–34.0)
MCHC: 35.1 g/dL (ref 30.0–36.0)
MCV: 104.9 fL — ABNORMAL HIGH (ref 80.0–100.0)
Monocytes Absolute: 0.4 10*3/uL (ref 0.1–1.0)
Monocytes Relative: 10 %
Neutro Abs: 2.5 10*3/uL (ref 1.7–7.7)
Neutrophils Relative %: 56 %
Platelet Count: 192 10*3/uL (ref 150–400)
RBC: 3.26 MIL/uL — ABNORMAL LOW (ref 4.22–5.81)
RDW: 13.2 % (ref 11.5–15.5)
WBC Count: 4.5 10*3/uL (ref 4.0–10.5)
nRBC: 0 % (ref 0.0–0.2)

## 2022-07-09 MED ORDER — SODIUM CHLORIDE 0.9 % IV SOLN
10.0000 mg | Freq: Once | INTRAVENOUS | Status: AC
  Administered 2022-07-09: 10 mg via INTRAVENOUS
  Filled 2022-07-09: qty 10

## 2022-07-09 MED ORDER — PROCHLORPERAZINE MALEATE 10 MG PO TABS
10.0000 mg | ORAL_TABLET | Freq: Once | ORAL | Status: AC
  Administered 2022-07-09: 10 mg via ORAL
  Filled 2022-07-09: qty 1

## 2022-07-09 MED ORDER — SODIUM CHLORIDE 0.9 % IV SOLN: Freq: Once | INTRAVENOUS | Status: AC

## 2022-07-09 MED ORDER — ACETAMINOPHEN 500 MG PO TABS
1000.0000 mg | ORAL_TABLET | Freq: Once | ORAL | Status: AC
  Administered 2022-07-09: 1000 mg via ORAL
  Filled 2022-07-09: qty 2

## 2022-07-09 MED ORDER — SODIUM CHLORIDE 0.9% FLUSH
10.0000 mL | Freq: Once | INTRAVENOUS | Status: AC
  Administered 2022-07-09: 10 mL

## 2022-07-09 MED ORDER — DEXTROSE 5 % IV SOLN
56.0000 mg/m2 | Freq: Once | INTRAVENOUS | Status: AC
  Administered 2022-07-09: 120 mg via INTRAVENOUS
  Filled 2022-07-09: qty 60

## 2022-07-09 NOTE — Progress Notes (Signed)
Ok to treat without CMP today per Dr. Lorenso Courier.

## 2022-07-10 ENCOUNTER — Other Ambulatory Visit: Payer: Self-pay | Admitting: Hematology and Oncology

## 2022-07-10 ENCOUNTER — Telehealth: Payer: Self-pay | Admitting: *Deleted

## 2022-07-10 DIAGNOSIS — C9001 Multiple myeloma in remission: Secondary | ICD-10-CM

## 2022-07-10 MED ORDER — TRAMADOL HCL 50 MG PO TABS: 50.0000 mg | ORAL_TABLET | Freq: Four times a day (QID) | ORAL | 0 refills | Status: DC | PRN

## 2022-07-10 NOTE — Telephone Encounter (Signed)
[name] called for a refill of Tramadol. States they are going out of town and he does not want to run out while he is gone. (Dr Grier Mitts patient)

## 2022-07-16 LAB — MULTIPLE MYELOMA PANEL, SERUM
Albumin SerPl Elph-Mcnc: 3.6 g/dL (ref 2.9–4.4)
Albumin/Glob SerPl: 1.9 — ABNORMAL HIGH (ref 0.7–1.7)
Alpha 1: 0.3 g/dL (ref 0.0–0.4)
Alpha2 Glob SerPl Elph-Mcnc: 0.6 g/dL (ref 0.4–1.0)
B-Globulin SerPl Elph-Mcnc: 0.9 g/dL (ref 0.7–1.3)
Gamma Glob SerPl Elph-Mcnc: 0.3 g/dL — ABNORMAL LOW (ref 0.4–1.8)
Globulin, Total: 2 g/dL — ABNORMAL LOW (ref 2.2–3.9)
IgA: 18 mg/dL — ABNORMAL LOW (ref 61–437)
IgG (Immunoglobin G), Serum: 216 mg/dL — ABNORMAL LOW (ref 603–1613)
IgM (Immunoglobulin M), Srm: 7 mg/dL — ABNORMAL LOW (ref 15–143)
Total Protein ELP: 5.6 g/dL — ABNORMAL LOW (ref 6.0–8.5)

## 2022-07-21 ENCOUNTER — Other Ambulatory Visit: Payer: Self-pay

## 2022-07-21 DIAGNOSIS — C9000 Multiple myeloma not having achieved remission: Secondary | ICD-10-CM

## 2022-07-23 ENCOUNTER — Other Ambulatory Visit: Payer: Self-pay | Admitting: Hematology

## 2022-07-23 ENCOUNTER — Inpatient Hospital Stay: Payer: Medicare Other

## 2022-07-23 ENCOUNTER — Inpatient Hospital Stay: Payer: Medicare Other | Attending: Hematology

## 2022-07-23 VITALS — BP 115/81 | HR 79 | Temp 97.7°F | Resp 18 | Wt 199.5 lb

## 2022-07-23 DIAGNOSIS — Z7189 Other specified counseling: Secondary | ICD-10-CM

## 2022-07-23 DIAGNOSIS — C9001 Multiple myeloma in remission: Secondary | ICD-10-CM | POA: Insufficient documentation

## 2022-07-23 DIAGNOSIS — C9000 Multiple myeloma not having achieved remission: Secondary | ICD-10-CM

## 2022-07-23 DIAGNOSIS — Z79899 Other long term (current) drug therapy: Secondary | ICD-10-CM | POA: Diagnosis not present

## 2022-07-23 DIAGNOSIS — Z5112 Encounter for antineoplastic immunotherapy: Secondary | ICD-10-CM | POA: Diagnosis present

## 2022-07-23 DIAGNOSIS — Z95828 Presence of other vascular implants and grafts: Secondary | ICD-10-CM

## 2022-07-23 LAB — CBC WITH DIFFERENTIAL (CANCER CENTER ONLY)
Abs Immature Granulocytes: 0 10*3/uL (ref 0.00–0.07)
Basophils Absolute: 0 10*3/uL (ref 0.0–0.1)
Basophils Relative: 1 %
Eosinophils Absolute: 0.1 10*3/uL (ref 0.0–0.5)
Eosinophils Relative: 3 %
HCT: 35.5 % — ABNORMAL LOW (ref 39.0–52.0)
Hemoglobin: 12.3 g/dL — ABNORMAL LOW (ref 13.0–17.0)
Immature Granulocytes: 0 %
Lymphocytes Relative: 32 %
Lymphs Abs: 1.4 10*3/uL (ref 0.7–4.0)
MCH: 36.7 pg — ABNORMAL HIGH (ref 26.0–34.0)
MCHC: 34.6 g/dL (ref 30.0–36.0)
MCV: 106 fL — ABNORMAL HIGH (ref 80.0–100.0)
Monocytes Absolute: 0.4 10*3/uL (ref 0.1–1.0)
Monocytes Relative: 9 %
Neutro Abs: 2.4 10*3/uL (ref 1.7–7.7)
Neutrophils Relative %: 55 %
Platelet Count: 162 10*3/uL (ref 150–400)
RBC: 3.35 MIL/uL — ABNORMAL LOW (ref 4.22–5.81)
RDW: 13.1 % (ref 11.5–15.5)
WBC Count: 4.4 10*3/uL (ref 4.0–10.5)
nRBC: 0 % (ref 0.0–0.2)

## 2022-07-23 LAB — CMP (CANCER CENTER ONLY)
ALT: 14 U/L (ref 0–44)
AST: 20 U/L (ref 15–41)
Albumin: 4.1 g/dL (ref 3.5–5.0)
Alkaline Phosphatase: 59 U/L (ref 38–126)
Anion gap: 5 (ref 5–15)
BUN: 21 mg/dL (ref 8–23)
CO2: 27 mmol/L (ref 22–32)
Calcium: 9.7 mg/dL (ref 8.9–10.3)
Chloride: 107 mmol/L (ref 98–111)
Creatinine: 1.32 mg/dL — ABNORMAL HIGH (ref 0.61–1.24)
GFR, Estimated: 56 mL/min — ABNORMAL LOW (ref >60)
Glucose, Bld: 83 mg/dL (ref 70–99)
Potassium: 4.3 mmol/L (ref 3.5–5.1)
Sodium: 139 mmol/L (ref 135–145)
Total Bilirubin: 0.5 mg/dL (ref 0.3–1.2)
Total Protein: 6 g/dL — ABNORMAL LOW (ref 6.5–8.1)

## 2022-07-23 MED ORDER — SODIUM CHLORIDE 0.9% FLUSH
10.0000 mL | INTRAVENOUS | Status: DC | PRN
  Administered 2022-07-23: 10 mL

## 2022-07-23 MED ORDER — SODIUM CHLORIDE 0.9 % IV SOLN: Freq: Once | INTRAVENOUS | Status: AC

## 2022-07-23 MED ORDER — SODIUM CHLORIDE 0.9% FLUSH
10.0000 mL | Freq: Once | INTRAVENOUS | Status: AC
  Administered 2022-07-23: 10 mL

## 2022-07-23 MED ORDER — ACETAMINOPHEN 500 MG PO TABS
1000.0000 mg | ORAL_TABLET | Freq: Once | ORAL | Status: AC
  Administered 2022-07-23: 1000 mg via ORAL
  Filled 2022-07-23: qty 2

## 2022-07-23 MED ORDER — HEPARIN SOD (PORK) LOCK FLUSH 100 UNIT/ML IV SOLN
500.0000 [IU] | Freq: Once | INTRAVENOUS | Status: AC | PRN
  Administered 2022-07-23: 500 [IU]

## 2022-07-23 MED ORDER — DEXTROSE 5 % IV SOLN
56.0000 mg/m2 | Freq: Once | INTRAVENOUS | Status: AC
  Administered 2022-07-23: 120 mg via INTRAVENOUS
  Filled 2022-07-23: qty 60

## 2022-07-23 MED ORDER — PROCHLORPERAZINE MALEATE 10 MG PO TABS
10.0000 mg | ORAL_TABLET | Freq: Once | ORAL | Status: AC
  Administered 2022-07-23: 10 mg via ORAL
  Filled 2022-07-23: qty 1

## 2022-07-23 MED ORDER — SODIUM CHLORIDE 0.9 % IV SOLN
10.0000 mg | Freq: Once | INTRAVENOUS | Status: AC
  Administered 2022-07-23: 10 mg via INTRAVENOUS
  Filled 2022-07-23: qty 10

## 2022-07-29 ENCOUNTER — Other Ambulatory Visit: Payer: Self-pay | Admitting: Hematology

## 2022-07-29 DIAGNOSIS — C9000 Multiple myeloma not having achieved remission: Secondary | ICD-10-CM

## 2022-08-05 ENCOUNTER — Other Ambulatory Visit: Payer: Self-pay

## 2022-08-05 DIAGNOSIS — C9000 Multiple myeloma not having achieved remission: Secondary | ICD-10-CM

## 2022-08-05 MED FILL — Dexamethasone Sodium Phosphate Inj 100 MG/10ML: INTRAMUSCULAR | Qty: 1 | Status: AC

## 2022-08-06 ENCOUNTER — Other Ambulatory Visit: Payer: Self-pay

## 2022-08-06 ENCOUNTER — Encounter: Payer: Self-pay | Admitting: Hematology

## 2022-08-06 ENCOUNTER — Inpatient Hospital Stay: Payer: Medicare Other

## 2022-08-06 VITALS — BP 122/82 | HR 77 | Temp 98.2°F | Resp 18

## 2022-08-06 DIAGNOSIS — C9000 Multiple myeloma not having achieved remission: Secondary | ICD-10-CM

## 2022-08-06 DIAGNOSIS — Z5112 Encounter for antineoplastic immunotherapy: Secondary | ICD-10-CM | POA: Diagnosis not present

## 2022-08-06 DIAGNOSIS — Z7189 Other specified counseling: Secondary | ICD-10-CM

## 2022-08-06 DIAGNOSIS — Z95828 Presence of other vascular implants and grafts: Secondary | ICD-10-CM

## 2022-08-06 LAB — CBC WITH DIFFERENTIAL (CANCER CENTER ONLY)
Abs Immature Granulocytes: 0.01 10*3/uL (ref 0.00–0.07)
Basophils Absolute: 0 10*3/uL (ref 0.0–0.1)
Basophils Relative: 1 %
Eosinophils Absolute: 0.1 10*3/uL (ref 0.0–0.5)
Eosinophils Relative: 2 %
HCT: 34.9 % — ABNORMAL LOW (ref 39.0–52.0)
Hemoglobin: 12.2 g/dL — ABNORMAL LOW (ref 13.0–17.0)
Immature Granulocytes: 0 %
Lymphocytes Relative: 30 %
Lymphs Abs: 1.3 10*3/uL (ref 0.7–4.0)
MCH: 37.1 pg — ABNORMAL HIGH (ref 26.0–34.0)
MCHC: 35 g/dL (ref 30.0–36.0)
MCV: 106.1 fL — ABNORMAL HIGH (ref 80.0–100.0)
Monocytes Absolute: 0.3 10*3/uL (ref 0.1–1.0)
Monocytes Relative: 7 %
Neutro Abs: 2.7 10*3/uL (ref 1.7–7.7)
Neutrophils Relative %: 60 %
Platelet Count: 158 10*3/uL (ref 150–400)
RBC: 3.29 MIL/uL — ABNORMAL LOW (ref 4.22–5.81)
RDW: 13 % (ref 11.5–15.5)
WBC Count: 4.4 10*3/uL (ref 4.0–10.5)
nRBC: 0 % (ref 0.0–0.2)

## 2022-08-06 LAB — CMP (CANCER CENTER ONLY)
ALT: 14 U/L (ref 0–44)
AST: 20 U/L (ref 15–41)
Albumin: 3.9 g/dL (ref 3.5–5.0)
Alkaline Phosphatase: 65 U/L (ref 38–126)
Anion gap: 5 (ref 5–15)
BUN: 22 mg/dL (ref 8–23)
CO2: 28 mmol/L (ref 22–32)
Calcium: 9.6 mg/dL (ref 8.9–10.3)
Chloride: 106 mmol/L (ref 98–111)
Creatinine: 1.4 mg/dL — ABNORMAL HIGH (ref 0.61–1.24)
GFR, Estimated: 52 mL/min — ABNORMAL LOW (ref >60)
Glucose, Bld: 122 mg/dL — ABNORMAL HIGH (ref 70–99)
Potassium: 4 mmol/L (ref 3.5–5.1)
Sodium: 139 mmol/L (ref 135–145)
Total Bilirubin: 0.6 mg/dL (ref 0.3–1.2)
Total Protein: 5.8 g/dL — ABNORMAL LOW (ref 6.5–8.1)

## 2022-08-06 MED ORDER — SODIUM CHLORIDE 0.9% FLUSH
10.0000 mL | INTRAVENOUS | Status: DC | PRN
  Administered 2022-08-06: 10 mL

## 2022-08-06 MED ORDER — SODIUM CHLORIDE 0.9% FLUSH
10.0000 mL | Freq: Once | INTRAVENOUS | Status: AC
  Administered 2022-08-06: 10 mL

## 2022-08-06 MED ORDER — SODIUM CHLORIDE 0.9 % IV SOLN: Freq: Once | INTRAVENOUS | Status: AC

## 2022-08-06 MED ORDER — DEXTROSE 5 % IV SOLN
56.0000 mg/m2 | Freq: Once | INTRAVENOUS | Status: AC
  Administered 2022-08-06: 120 mg via INTRAVENOUS
  Filled 2022-08-06: qty 60

## 2022-08-06 MED ORDER — PROCHLORPERAZINE MALEATE 10 MG PO TABS
10.0000 mg | ORAL_TABLET | Freq: Once | ORAL | Status: AC
  Administered 2022-08-06: 10 mg via ORAL
  Filled 2022-08-06: qty 1

## 2022-08-06 MED ORDER — HEPARIN SOD (PORK) LOCK FLUSH 100 UNIT/ML IV SOLN
500.0000 [IU] | Freq: Once | INTRAVENOUS | Status: AC | PRN
  Administered 2022-08-06: 500 [IU]

## 2022-08-06 MED ORDER — ACETAMINOPHEN 500 MG PO TABS
1000.0000 mg | ORAL_TABLET | Freq: Once | ORAL | Status: AC
  Administered 2022-08-06: 1000 mg via ORAL
  Filled 2022-08-06: qty 2

## 2022-08-06 MED ORDER — SODIUM CHLORIDE 0.9 % IV SOLN
10.0000 mg | Freq: Once | INTRAVENOUS | Status: AC
  Administered 2022-08-06: 10 mg via INTRAVENOUS
  Filled 2022-08-06: qty 10

## 2022-08-06 NOTE — Patient Instructions (Signed)
[facility]  Discharge Instructions: Thank you for choosing [facility] to provide your oncology and hematology care.   If you have a lab appointment with the [facility], please go directly to the [facility] and check in at the registration area.   Wear comfortable clothing and clothing appropriate for easy access to any Portacath or PICC line.   We strive to give you quality time with your provider. You may need to reschedule your appointment if you arrive late (15 or more minutes).  Arriving late affects you and other patients whose appointments are after yours.  Also, if you miss three or more appointments without notifying the office, you may be dismissed from the clinic at the provider's discretion.      For prescription refill requests, have your pharmacy contact our office and allow 72 hours for refills to be completed.    Today you received the following chemotherapy and/or immunotherapy agents: Kyprolis.      To help prevent nausea and vomiting after your treatment, we encourage you to take your nausea medication as directed.  BELOW ARE SYMPTOMS THAT SHOULD BE REPORTED IMMEDIATELY: *FEVER GREATER THAN 100.4 F (38 C) OR HIGHER *CHILLS OR SWEATING *NAUSEA AND VOMITING THAT IS NOT CONTROLLED WITH YOUR NAUSEA MEDICATION *UNUSUAL SHORTNESS OF BREATH *UNUSUAL BRUISING OR BLEEDING *URINARY PROBLEMS (pain or burning when urinating, or frequent urination) *BOWEL PROBLEMS (unusual diarrhea, constipation, pain near the anus) TENDERNESS IN MOUTH AND THROAT WITH OR WITHOUT PRESENCE OF ULCERS (sore throat, sores in mouth, or a toothache) UNUSUAL RASH, SWELLING OR PAIN  UNUSUAL VAGINAL DISCHARGE OR ITCHING   Items with * indicate a potential emergency and should be followed up as soon as possible or go to the Emergency Department if any problems should occur.  Please show the CHEMOTHERAPY ALERT CARD or IMMUNOTHERAPY ALERT CARD at check-in to  the Emergency Department and triage nurse.  Should you have questions after your visit or need to cancel or reschedule your appointment, please contact [facility]  Dept: [phone]  and follow the prompts.  Office hours are 8:00 a.m. to 4:30 p.m. Monday - Friday. Please note that voicemails left after 4:00 p.m. may not be returned until the following business day.  We are closed weekends and major holidays. You have access to a nurse at all times for urgent questions. Please call the main number to the clinic Dept: [phone] and follow the prompts.   For any non-urgent questions, you may also contact your provider using MyChart. We now offer e-Visits for anyone 53 and older to request care online for non-urgent symptoms. For details visit [email or web address].   Also download the MyChart app! Go to the app store, search "MyChart", open the app, select [facility], and log in with your MyChart username and password.  Masks are optional in the cancer centers. If you would like for your care team to wear a mask while they are taking care of you, please let them know. You may have one support person who is at least 76 years old accompany you for your appointments. Pamidronate Injection What is this medication? PAMIDRONATE (pa mi DROE nate) treats high calcium levels in the blood caused by cancer. It may also be used with chemotherapy to treat weakened bones caused by cancer. It can also be used to treat Paget's disease of the bone. It works by slowing down the release of calcium from bones. This lowers calcium levels  in your blood. It also makes your bones stronger and less likely to break (fracture). It belongs to a group of medications called bisphosphonates. This medicine may be used for other purposes; ask your health care provider or pharmacist if you have questions. COMMON BRAND NAME(S): Aredia What should I tell my care team before I take this  medication? They need to know if you have any of these conditions: Bleeding disorder Cancer Dental disease Kidney disease Low levels of calcium or other minerals in the blood Low red blood cell counts Receiving steroids, such as dexamethasone or prednisone An unusual or allergic reaction to pamidronate, other medications, foods, dyes or preservatives Pregnant or trying to get pregnant Breast-feeding How should I use this medication? This medication is injected into a vein. It is given by your care team in a hospital or clinic setting. Talk to your care team about the use of this medication in children. Special care may be needed. Overdosage: If you think you have taken too much of this medicine contact a poison control center or emergency room at once. NOTE: This medicine is only for you. Do not share this medicine with others. What if I miss a dose? Keep appointments for follow-up doses. It is important not to miss your dose. Call your care team if you are unable to keep an appointment. What may interact with this medication? Certain antibiotics given by injection Medications for inflammation or pain, such as ibuprofen, naproxen Some diuretics, such as bumetanide, furosemide Cyclosporine Parathyroid hormone Tacrolimus Teriparatide Thalidomide This list may not describe all possible interactions. Give your health care provider a list of all the medicines, herbs, non-prescription drugs, or dietary supplements you use. Also tell them if you smoke, drink alcohol, or use illegal drugs. Some items may interact with your medicine. What should I watch for while using this medication? Visit your care team for regular checks on your progress. It may be some time before you see the benefit from this medication. Some people who take this medication have severe bone, joint, or muscle pain. This medication may also increase your risk for jaw problems or a broken thigh bone. Tell your care team  right away if you have severe pain in your jaw, bones, joints, or muscles. Tell your care team if you have any pain that does not go away or that gets worse. Tell your dentist and dental surgeon that you are taking this medication. You should not have major dental surgery while on this medication. See your dentist to have a dental exam and fix any dental problems before starting this medication. Take good care of your teeth while on this medication. Make sure you see your dentist for regular follow-up appointments. You should make sure you get enough calcium and vitamin D while you are taking this medication. Discuss the foods you eat and the vitamins you take with your care team. You may need bloodwork while you are taking this medication. Talk to your care team if you wish to become pregnant or think you might be pregnant. This medication can cause serious birth defects. What side effects may I notice from receiving this medication? Side effects that you should report to your care team as soon as possible: Allergic reactions--skin rash, itching, hives, swelling of the face, lips, tongue, or throat Kidney injury--decrease in the amount of urine, swelling of the ankles, hands, or feet Low calcium level--muscle pain or cramps, confusion, tingling, or numbness in the hands or feet Osteonecrosis of the  jaw--pain, swelling, or redness in the mouth, numbness of the jaw, poor healing after dental work, unusual discharge from the mouth, visible bones in the mouth Severe bone, joint, or muscle pain Side effects that usually do not require medical attention (report to your care team if they continue or are bothersome): Constipation Fatigue Fever Loss of appetite Nausea Pain, redness, or irritation at injection site Stomach pain This list may not describe all possible side effects. Call your doctor for medical advice about side effects. You may report side effects to FDA at 1-800-FDA-1088. Where should I  keep my medication? This medication is given in a hospital or clinic. It will not be stored at home. NOTE: This sheet is a summary. It may not cover all possible information. If you have questions about this medicine, talk to your doctor, pharmacist, or health care provider.  2023 Elsevier/Gold Standard (2021-08-23 00:00:00)

## 2022-08-07 ENCOUNTER — Other Ambulatory Visit: Payer: Self-pay

## 2022-08-07 ENCOUNTER — Encounter: Payer: Self-pay | Admitting: Internal Medicine

## 2022-08-12 LAB — MULTIPLE MYELOMA PANEL, SERUM
Albumin SerPl Elph-Mcnc: 3.7 g/dL (ref 2.9–4.4)
Albumin/Glob SerPl: 2.1 — ABNORMAL HIGH (ref 0.7–1.7)
Alpha 1: 0.2 g/dL (ref 0.0–0.4)
Alpha2 Glob SerPl Elph-Mcnc: 0.6 g/dL (ref 0.4–1.0)
B-Globulin SerPl Elph-Mcnc: 0.8 g/dL (ref 0.7–1.3)
Gamma Glob SerPl Elph-Mcnc: 0.2 g/dL — ABNORMAL LOW (ref 0.4–1.8)
Globulin, Total: 1.8 g/dL — ABNORMAL LOW (ref 2.2–3.9)
IgA: 16 mg/dL — ABNORMAL LOW (ref 61–437)
IgG (Immunoglobin G), Serum: 197 mg/dL — ABNORMAL LOW (ref 603–1613)
IgM (Immunoglobulin M), Srm: 6 mg/dL — ABNORMAL LOW (ref 15–143)
Total Protein ELP: 5.5 g/dL — ABNORMAL LOW (ref 6.0–8.5)

## 2022-08-16 ENCOUNTER — Other Ambulatory Visit: Payer: Self-pay | Admitting: *Deleted

## 2022-08-16 DIAGNOSIS — C9000 Multiple myeloma not having achieved remission: Secondary | ICD-10-CM

## 2022-08-19 ENCOUNTER — Encounter: Payer: Self-pay | Admitting: Hematology

## 2022-08-19 ENCOUNTER — Other Ambulatory Visit: Payer: Self-pay

## 2022-08-19 DIAGNOSIS — C9001 Multiple myeloma in remission: Secondary | ICD-10-CM

## 2022-08-19 MED FILL — Dexamethasone Sodium Phosphate Inj 100 MG/10ML: INTRAMUSCULAR | Qty: 1 | Status: AC

## 2022-08-19 NOTE — Patient Instructions (Addendum)
Referral for sleep apnea consultation.  Colonoscopy is due at [facility].  Immunizations reviewed.  Consider pneumococcal 20 vaccine.  Continue current medications and follow-up in 1 year or as needed.  Please continue to treat hand abrasion with gentle cleansing and applying Bactroban ointment.  Tetanus immunization is up-to-date.  Chest x-ray ordered for shortness of breath (addendum: Patient has not had chest x-ray yet as of late January 2024)

## 2022-08-20 ENCOUNTER — Inpatient Hospital Stay: Payer: Medicare Other

## 2022-08-20 VITALS — BP 138/77 | HR 61 | Temp 98.0°F | Resp 17 | Wt 202.5 lb

## 2022-08-20 DIAGNOSIS — C9000 Multiple myeloma not having achieved remission: Secondary | ICD-10-CM

## 2022-08-20 DIAGNOSIS — Z95828 Presence of other vascular implants and grafts: Secondary | ICD-10-CM

## 2022-08-20 DIAGNOSIS — Z7189 Other specified counseling: Secondary | ICD-10-CM

## 2022-08-20 DIAGNOSIS — Z5112 Encounter for antineoplastic immunotherapy: Secondary | ICD-10-CM | POA: Diagnosis not present

## 2022-08-20 LAB — CBC WITH DIFFERENTIAL (CANCER CENTER ONLY)
Abs Immature Granulocytes: 0.01 10*3/uL (ref 0.00–0.07)
Basophils Absolute: 0 10*3/uL (ref 0.0–0.1)
Basophils Relative: 1 %
Eosinophils Absolute: 0.1 10*3/uL (ref 0.0–0.5)
Eosinophils Relative: 3 %
HCT: 34.9 % — ABNORMAL LOW (ref 39.0–52.0)
Hemoglobin: 11.9 g/dL — ABNORMAL LOW (ref 13.0–17.0)
Immature Granulocytes: 0 %
Lymphocytes Relative: 31 %
Lymphs Abs: 1.4 10*3/uL (ref 0.7–4.0)
MCH: 35.6 pg — ABNORMAL HIGH (ref 26.0–34.0)
MCHC: 34.1 g/dL (ref 30.0–36.0)
MCV: 104.5 fL — ABNORMAL HIGH (ref 80.0–100.0)
Monocytes Absolute: 0.4 10*3/uL (ref 0.1–1.0)
Monocytes Relative: 10 %
Neutro Abs: 2.5 10*3/uL (ref 1.7–7.7)
Neutrophils Relative %: 55 %
Platelet Count: 156 10*3/uL (ref 150–400)
RBC: 3.34 MIL/uL — ABNORMAL LOW (ref 4.22–5.81)
RDW: 12.9 % (ref 11.5–15.5)
WBC Count: 4.5 10*3/uL (ref 4.0–10.5)
nRBC: 0 % (ref 0.0–0.2)

## 2022-08-20 LAB — CMP (CANCER CENTER ONLY)
ALT: 14 U/L (ref 0–44)
AST: 20 U/L (ref 15–41)
Albumin: 3.9 g/dL (ref 3.5–5.0)
Alkaline Phosphatase: 60 U/L (ref 38–126)
Anion gap: 6 (ref 5–15)
BUN: 20 mg/dL (ref 8–23)
CO2: 27 mmol/L (ref 22–32)
Calcium: 9.7 mg/dL (ref 8.9–10.3)
Chloride: 107 mmol/L (ref 98–111)
Creatinine: 1.66 mg/dL — ABNORMAL HIGH (ref 0.61–1.24)
GFR, Estimated: 43 mL/min — ABNORMAL LOW (ref >60)
Glucose, Bld: 131 mg/dL — ABNORMAL HIGH (ref 70–99)
Potassium: 4.1 mmol/L (ref 3.5–5.1)
Sodium: 140 mmol/L (ref 135–145)
Total Bilirubin: 0.6 mg/dL (ref 0.3–1.2)
Total Protein: 5.8 g/dL — ABNORMAL LOW (ref 6.5–8.1)

## 2022-08-20 MED ORDER — PROCHLORPERAZINE MALEATE 10 MG PO TABS
10.0000 mg | ORAL_TABLET | Freq: Once | ORAL | Status: AC
  Administered 2022-08-20: 10 mg via ORAL
  Filled 2022-08-20: qty 1

## 2022-08-20 MED ORDER — TRAMADOL HCL 50 MG PO TABS: 50.0000 mg | ORAL_TABLET | Freq: Four times a day (QID) | ORAL | 0 refills | Status: DC | PRN

## 2022-08-20 MED ORDER — ACETAMINOPHEN 500 MG PO TABS
1000.0000 mg | ORAL_TABLET | Freq: Once | ORAL | Status: AC
  Administered 2022-08-20: 1000 mg via ORAL
  Filled 2022-08-20: qty 2

## 2022-08-20 MED ORDER — SODIUM CHLORIDE 0.9% FLUSH
10.0000 mL | INTRAVENOUS | Status: DC | PRN
  Administered 2022-08-20: 10 mL

## 2022-08-20 MED ORDER — SODIUM CHLORIDE 0.9 % IV SOLN
10.0000 mg | Freq: Once | INTRAVENOUS | Status: AC
  Administered 2022-08-20: 10 mg via INTRAVENOUS
  Filled 2022-08-20: qty 10

## 2022-08-20 MED ORDER — DEXTROSE 5 % IV SOLN
56.0000 mg/m2 | Freq: Once | INTRAVENOUS | Status: AC
  Administered 2022-08-20: 120 mg via INTRAVENOUS
  Filled 2022-08-20: qty 60

## 2022-08-20 MED ORDER — SODIUM CHLORIDE 0.9 % IV SOLN: Freq: Once | INTRAVENOUS | Status: DC

## 2022-08-20 MED ORDER — SODIUM CHLORIDE 0.9 % IV SOLN: Freq: Once | INTRAVENOUS | Status: AC

## 2022-08-20 MED ORDER — SODIUM CHLORIDE 0.9% FLUSH
10.0000 mL | Freq: Once | INTRAVENOUS | Status: AC
  Administered 2022-08-20: 10 mL

## 2022-08-20 MED ORDER — HEPARIN SOD (PORK) LOCK FLUSH 100 UNIT/ML IV SOLN
500.0000 [IU] | Freq: Once | INTRAVENOUS | Status: AC | PRN
  Administered 2022-08-20: 500 [IU]

## 2022-08-20 NOTE — Patient Instructions (Addendum)
[facility]  Discharge Instructions: Thank you for choosing [facility] to provide your oncology and hematology care.   If you have a lab appointment with the Cancer Center, please go directly to the Cancer Center and check in at the registration area.   Wear comfortable clothing and clothing appropriate for easy access to any Portacath or PICC line.   We strive to give you quality time with your provider. You may need to reschedule your appointment if you arrive late (15 or more minutes).  Arriving late affects you and other patients whose appointments are after yours.  Also, if you miss three or more appointments without notifying the office, you may be dismissed from the clinic at the provider's discretion.      For prescription refill requests, have your pharmacy contact our office and allow 72 hours for refills to be completed.    Today you received the following chemotherapy and/or immunotherapy agents :  Kyprolis   To help prevent nausea and vomiting after your treatment, we encourage you to take your nausea medication as directed.  BELOW ARE SYMPTOMS THAT SHOULD BE REPORTED IMMEDIATELY: *FEVER GREATER THAN 100.4 F (38 C) OR HIGHER *CHILLS OR SWEATING *NAUSEA AND VOMITING THAT IS NOT CONTROLLED WITH YOUR NAUSEA MEDICATION *UNUSUAL SHORTNESS OF BREATH *UNUSUAL BRUISING OR BLEEDING *URINARY PROBLEMS (pain or burning when urinating, or frequent urination) *BOWEL PROBLEMS (unusual diarrhea, constipation, pain near the anus) TENDERNESS IN MOUTH AND THROAT WITH OR WITHOUT PRESENCE OF ULCERS (sore throat, sores in mouth, or a toothache) UNUSUAL RASH, SWELLING OR PAIN  UNUSUAL VAGINAL DISCHARGE OR ITCHING   Items with * indicate a potential emergency and should be followed up as soon as possible or go to the Emergency Department if any problems should occur.  Please show the CHEMOTHERAPY ALERT CARD or IMMUNOTHERAPY ALERT CARD at  check-in to the Emergency Department and triage nurse.  Should you have questions after your visit or need to cancel or reschedule your appointment, please contact [facility]  Dept: [phone]  and follow the prompts.  Office hours are 8:00 a.m. to 4:30 p.m. Monday - Friday. Please note that voicemails left after 4:00 p.m. may not be returned until the following business day.  We are closed weekends and major holidays. You have access to a nurse at all times for urgent questions. Please call the main number to the clinic Dept: [phone] and follow the prompts.   For any non-urgent questions, you may also contact your provider using MyChart. We now offer e-Visits for anyone 18 and older to request care online for non-urgent symptoms. For details visit [email or web address].   Also download the MyChart app! Go to the app store, search "MyChart", open the app, select [facility], and log in with your MyChart username and password.   

## 2022-08-20 NOTE — Progress Notes (Signed)
Verified with Dr. Irene Limbo, ok to treat with Crt. Of 1.66

## 2022-08-27 ENCOUNTER — Ambulatory Visit: Payer: Medicare Other | Admitting: Hematology

## 2022-09-02 ENCOUNTER — Other Ambulatory Visit: Payer: Self-pay

## 2022-09-02 DIAGNOSIS — C9000 Multiple myeloma not having achieved remission: Secondary | ICD-10-CM

## 2022-09-02 MED FILL — Dexamethasone Sodium Phosphate Inj 100 MG/10ML: INTRAMUSCULAR | Qty: 1 | Status: AC

## 2022-09-03 ENCOUNTER — Inpatient Hospital Stay: Payer: Medicare Other

## 2022-09-03 ENCOUNTER — Other Ambulatory Visit: Payer: Self-pay

## 2022-09-03 ENCOUNTER — Inpatient Hospital Stay: Payer: Medicare Other | Attending: Hematology | Admitting: Hematology

## 2022-09-03 VITALS — BP 137/93 | HR 75 | Resp 17

## 2022-09-03 VITALS — BP 123/78 | HR 81 | Temp 98.1°F | Resp 20 | Wt 201.1 lb

## 2022-09-03 DIAGNOSIS — Z79899 Other long term (current) drug therapy: Secondary | ICD-10-CM | POA: Diagnosis not present

## 2022-09-03 DIAGNOSIS — C9001 Multiple myeloma in remission: Secondary | ICD-10-CM | POA: Diagnosis not present

## 2022-09-03 DIAGNOSIS — Z5112 Encounter for antineoplastic immunotherapy: Secondary | ICD-10-CM | POA: Diagnosis present

## 2022-09-03 DIAGNOSIS — C9000 Multiple myeloma not having achieved remission: Secondary | ICD-10-CM

## 2022-09-03 DIAGNOSIS — Z95828 Presence of other vascular implants and grafts: Secondary | ICD-10-CM

## 2022-09-03 DIAGNOSIS — Z5111 Encounter for antineoplastic chemotherapy: Secondary | ICD-10-CM

## 2022-09-03 DIAGNOSIS — Z7189 Other specified counseling: Secondary | ICD-10-CM

## 2022-09-03 LAB — CBC WITH DIFFERENTIAL (CANCER CENTER ONLY)
Abs Immature Granulocytes: 0 10*3/uL (ref 0.00–0.07)
Basophils Absolute: 0 10*3/uL (ref 0.0–0.1)
Basophils Relative: 1 %
Eosinophils Absolute: 0.1 10*3/uL (ref 0.0–0.5)
Eosinophils Relative: 3 %
HCT: 34.5 % — ABNORMAL LOW (ref 39.0–52.0)
Hemoglobin: 11.8 g/dL — ABNORMAL LOW (ref 13.0–17.0)
Immature Granulocytes: 0 %
Lymphocytes Relative: 28 %
Lymphs Abs: 1 10*3/uL (ref 0.7–4.0)
MCH: 35.8 pg — ABNORMAL HIGH (ref 26.0–34.0)
MCHC: 34.2 g/dL (ref 30.0–36.0)
MCV: 104.5 fL — ABNORMAL HIGH (ref 80.0–100.0)
Monocytes Absolute: 0.3 10*3/uL (ref 0.1–1.0)
Monocytes Relative: 9 %
Neutro Abs: 2.2 10*3/uL (ref 1.7–7.7)
Neutrophils Relative %: 59 %
Platelet Count: 144 10*3/uL — ABNORMAL LOW (ref 150–400)
RBC: 3.3 MIL/uL — ABNORMAL LOW (ref 4.22–5.81)
RDW: 12.8 % (ref 11.5–15.5)
WBC Count: 3.7 10*3/uL — ABNORMAL LOW (ref 4.0–10.5)
nRBC: 0 % (ref 0.0–0.2)

## 2022-09-03 LAB — CMP (CANCER CENTER ONLY)
ALT: 15 U/L (ref 0–44)
AST: 22 U/L (ref 15–41)
Albumin: 4 g/dL (ref 3.5–5.0)
Alkaline Phosphatase: 55 U/L (ref 38–126)
Anion gap: 8 (ref 5–15)
BUN: 20 mg/dL (ref 8–23)
CO2: 26 mmol/L (ref 22–32)
Calcium: 9.7 mg/dL (ref 8.9–10.3)
Chloride: 105 mmol/L (ref 98–111)
Creatinine: 1.64 mg/dL — ABNORMAL HIGH (ref 0.61–1.24)
GFR, Estimated: 43 mL/min — ABNORMAL LOW (ref >60)
Glucose, Bld: 139 mg/dL — ABNORMAL HIGH (ref 70–99)
Potassium: 4.1 mmol/L (ref 3.5–5.1)
Sodium: 139 mmol/L (ref 135–145)
Total Bilirubin: 0.6 mg/dL (ref 0.3–1.2)
Total Protein: 5.7 g/dL — ABNORMAL LOW (ref 6.5–8.1)

## 2022-09-03 MED ORDER — SODIUM CHLORIDE 0.9% FLUSH
10.0000 mL | Freq: Once | INTRAVENOUS | Status: AC
  Administered 2022-09-03: 10 mL

## 2022-09-03 MED ORDER — SODIUM CHLORIDE 0.9 % IV SOLN
10.0000 mg | Freq: Once | INTRAVENOUS | Status: AC
  Administered 2022-09-03: 10 mg via INTRAVENOUS
  Filled 2022-09-03: qty 10

## 2022-09-03 MED ORDER — SODIUM CHLORIDE 0.9% FLUSH
10.0000 mL | INTRAVENOUS | Status: DC | PRN
  Administered 2022-09-03: 10 mL

## 2022-09-03 MED ORDER — PROCHLORPERAZINE MALEATE 10 MG PO TABS
10.0000 mg | ORAL_TABLET | Freq: Once | ORAL | Status: AC
  Administered 2022-09-03: 10 mg via ORAL
  Filled 2022-09-03: qty 1

## 2022-09-03 MED ORDER — DEXTROSE 5 % IV SOLN
56.0000 mg/m2 | Freq: Once | INTRAVENOUS | Status: AC
  Administered 2022-09-03: 120 mg via INTRAVENOUS
  Filled 2022-09-03: qty 60

## 2022-09-03 MED ORDER — ACETAMINOPHEN 500 MG PO TABS
1000.0000 mg | ORAL_TABLET | Freq: Once | ORAL | Status: AC
  Administered 2022-09-03: 1000 mg via ORAL
  Filled 2022-09-03: qty 2

## 2022-09-03 MED ORDER — SODIUM CHLORIDE 0.9 % IV SOLN: Freq: Once | INTRAVENOUS | Status: AC

## 2022-09-03 MED ORDER — FENTANYL 12 MCG/HR TD PT72: 1.0000 | MEDICATED_PATCH | TRANSDERMAL | 0 refills | Status: DC

## 2022-09-03 MED ORDER — HEPARIN SOD (PORK) LOCK FLUSH 100 UNIT/ML IV SOLN
500.0000 [IU] | Freq: Once | INTRAVENOUS | Status: AC | PRN
  Administered 2022-09-03: 500 [IU]

## 2022-09-03 NOTE — Progress Notes (Signed)
Patient seen by MD today  Vitals are within treatment parameters.  Labs reviewed: ,"and are not all within treatment parameters. Dr Irene Limbo aware CR: 1.64 , PLTS: 144  Per physician team, patient is ready for treatment and there are NO modifications to the treatment plan.
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
## 2022-09-03 NOTE — Patient Instructions (Signed)
[facility]  Discharge Instructions: Thank you for choosing [facility] to provide your oncology and hematology care.   If you have a lab appointment with the [facility], please go directly to the [facility] and check in at the registration area.   Wear comfortable clothing and clothing appropriate for easy access to any Portacath or PICC line.   We strive to give you quality time with your provider. You may need to reschedule your appointment if you arrive late (15 or more minutes).  Arriving late affects you and other patients whose appointments are after yours.  Also, if you miss three or more appointments without notifying the office, you may be dismissed from the clinic at the provider's discretion.      For prescription refill requests, have your pharmacy contact our office and allow 72 hours for refills to be completed.    Today you received the following chemotherapy and/or immunotherapy agents: Kyprolis      To help prevent nausea and vomiting after your treatment, we encourage you to take your nausea medication as directed.  BELOW ARE SYMPTOMS THAT SHOULD BE REPORTED IMMEDIATELY: *FEVER GREATER THAN 100.4 F (38 C) OR HIGHER *CHILLS OR SWEATING *NAUSEA AND VOMITING THAT IS NOT CONTROLLED WITH YOUR NAUSEA MEDICATION *UNUSUAL SHORTNESS OF BREATH *UNUSUAL BRUISING OR BLEEDING *URINARY PROBLEMS (pain or burning when urinating, or frequent urination) *BOWEL PROBLEMS (unusual diarrhea, constipation, pain near the anus) TENDERNESS IN MOUTH AND THROAT WITH OR WITHOUT PRESENCE OF ULCERS (sore throat, sores in mouth, or a toothache) UNUSUAL RASH, SWELLING OR PAIN  UNUSUAL VAGINAL DISCHARGE OR ITCHING   Items with * indicate a potential emergency and should be followed up as soon as possible or go to the Emergency Department if any problems should occur.  Please show the CHEMOTHERAPY ALERT CARD or IMMUNOTHERAPY ALERT CARD at  check-in to the Emergency Department and triage nurse.  Should you have questions after your visit or need to cancel or reschedule your appointment, please contact [facility]  Dept: [phone]  and follow the prompts.  Office hours are 8:00 a.m. to 4:30 p.m. Monday - Friday. Please note that voicemails left after 4:00 p.m. may not be returned until the following business day.  We are closed weekends and major holidays. You have access to a nurse at all times for urgent questions. Please call the main number to the clinic Dept: [phone] and follow the prompts.   For any non-urgent questions, you may also contact your provider using MyChart. We now offer e-Visits for anyone 9 and older to request care online for non-urgent symptoms. For details visit [email or web address].   Also download the MyChart app! Go to the app store, search "MyChart", open the app, select [facility], and log in with your MyChart username and password.

## 2022-09-09 ENCOUNTER — Other Ambulatory Visit: Payer: Self-pay

## 2022-09-09 ENCOUNTER — Encounter: Payer: Self-pay | Admitting: Hematology

## 2022-09-09 DIAGNOSIS — C9001 Multiple myeloma in remission: Secondary | ICD-10-CM

## 2022-09-09 LAB — MULTIPLE MYELOMA PANEL, SERUM
Albumin SerPl Elph-Mcnc: 3.7 g/dL (ref 2.9–4.4)
Albumin/Glob SerPl: 2 — ABNORMAL HIGH (ref 0.7–1.7)
Alpha 1: 0.2 g/dL (ref 0.0–0.4)
Alpha2 Glob SerPl Elph-Mcnc: 0.6 g/dL (ref 0.4–1.0)
B-Globulin SerPl Elph-Mcnc: 0.8 g/dL (ref 0.7–1.3)
Gamma Glob SerPl Elph-Mcnc: 0.2 g/dL — ABNORMAL LOW (ref 0.4–1.8)
Globulin, Total: 1.9 g/dL — ABNORMAL LOW (ref 2.2–3.9)
IgA: 16 mg/dL — ABNORMAL LOW (ref 61–437)
IgG (Immunoglobin G), Serum: 203 mg/dL — ABNORMAL LOW (ref 603–1613)
IgM (Immunoglobulin M), Srm: 6 mg/dL — ABNORMAL LOW (ref 15–143)
Total Protein ELP: 5.6 g/dL — ABNORMAL LOW (ref 6.0–8.5)

## 2022-09-12 MED ORDER — TRAMADOL HCL 50 MG PO TABS: 50.0000 mg | ORAL_TABLET | Freq: Four times a day (QID) | ORAL | 0 refills | Status: DC | PRN

## 2022-09-13 ENCOUNTER — Other Ambulatory Visit: Payer: Self-pay

## 2022-09-13 DIAGNOSIS — C9000 Multiple myeloma not having achieved remission: Secondary | ICD-10-CM

## 2022-09-16 MED FILL — Dexamethasone Sodium Phosphate Inj 100 MG/10ML: INTRAMUSCULAR | Qty: 1 | Status: AC

## 2022-09-17 ENCOUNTER — Inpatient Hospital Stay: Payer: Medicare Other

## 2022-09-17 VITALS — BP 135/86 | HR 67 | Temp 98.4°F | Resp 18 | Wt 202.5 lb

## 2022-09-17 DIAGNOSIS — Z5112 Encounter for antineoplastic immunotherapy: Secondary | ICD-10-CM | POA: Diagnosis not present

## 2022-09-17 DIAGNOSIS — Z7189 Other specified counseling: Secondary | ICD-10-CM

## 2022-09-17 DIAGNOSIS — C9000 Multiple myeloma not having achieved remission: Secondary | ICD-10-CM

## 2022-09-17 DIAGNOSIS — Z95828 Presence of other vascular implants and grafts: Secondary | ICD-10-CM

## 2022-09-17 LAB — CMP (CANCER CENTER ONLY)
ALT: 15 U/L (ref 0–44)
AST: 22 U/L (ref 15–41)
Albumin: 3.9 g/dL (ref 3.5–5.0)
Alkaline Phosphatase: 58 U/L (ref 38–126)
Anion gap: 6 (ref 5–15)
BUN: 14 mg/dL (ref 8–23)
CO2: 26 mmol/L (ref 22–32)
Calcium: 9 mg/dL (ref 8.9–10.3)
Chloride: 107 mmol/L (ref 98–111)
Creatinine: 1.38 mg/dL — ABNORMAL HIGH (ref 0.61–1.24)
GFR, Estimated: 53 mL/min — ABNORMAL LOW (ref >60)
Glucose, Bld: 98 mg/dL (ref 70–99)
Potassium: 4.1 mmol/L (ref 3.5–5.1)
Sodium: 139 mmol/L (ref 135–145)
Total Bilirubin: 0.5 mg/dL (ref 0.3–1.2)
Total Protein: 5.7 g/dL — ABNORMAL LOW (ref 6.5–8.1)

## 2022-09-17 LAB — CBC WITH DIFFERENTIAL (CANCER CENTER ONLY)
Abs Immature Granulocytes: 0 10*3/uL (ref 0.00–0.07)
Basophils Absolute: 0 10*3/uL (ref 0.0–0.1)
Basophils Relative: 1 %
Eosinophils Absolute: 0.1 10*3/uL (ref 0.0–0.5)
Eosinophils Relative: 3 %
HCT: 33.3 % — ABNORMAL LOW (ref 39.0–52.0)
Hemoglobin: 11.5 g/dL — ABNORMAL LOW (ref 13.0–17.0)
Immature Granulocytes: 0 %
Lymphocytes Relative: 27 %
Lymphs Abs: 1 10*3/uL (ref 0.7–4.0)
MCH: 36.5 pg — ABNORMAL HIGH (ref 26.0–34.0)
MCHC: 34.5 g/dL (ref 30.0–36.0)
MCV: 105.7 fL — ABNORMAL HIGH (ref 80.0–100.0)
Monocytes Absolute: 0.4 10*3/uL (ref 0.1–1.0)
Monocytes Relative: 11 %
Neutro Abs: 2 10*3/uL (ref 1.7–7.7)
Neutrophils Relative %: 58 %
Platelet Count: 152 10*3/uL (ref 150–400)
RBC: 3.15 MIL/uL — ABNORMAL LOW (ref 4.22–5.81)
RDW: 13.2 % (ref 11.5–15.5)
WBC Count: 3.6 10*3/uL — ABNORMAL LOW (ref 4.0–10.5)
nRBC: 0 % (ref 0.0–0.2)

## 2022-09-17 MED ORDER — HEPARIN SOD (PORK) LOCK FLUSH 100 UNIT/ML IV SOLN
500.0000 [IU] | Freq: Once | INTRAVENOUS | Status: AC
  Administered 2022-09-17: 500 [IU]

## 2022-09-17 MED ORDER — SODIUM CHLORIDE 0.9 % IV SOLN: Freq: Once | INTRAVENOUS | Status: AC

## 2022-09-17 MED ORDER — DEXTROSE 5 % IV SOLN
56.0000 mg/m2 | Freq: Once | INTRAVENOUS | Status: AC
  Administered 2022-09-17: 120 mg via INTRAVENOUS
  Filled 2022-09-17: qty 60

## 2022-09-17 MED ORDER — SODIUM CHLORIDE 0.9% FLUSH
10.0000 mL | Freq: Once | INTRAVENOUS | Status: AC
  Administered 2022-09-17: 10 mL

## 2022-09-17 MED ORDER — PROCHLORPERAZINE MALEATE 10 MG PO TABS
10.0000 mg | ORAL_TABLET | Freq: Once | ORAL | Status: AC
  Administered 2022-09-17: 10 mg via ORAL
  Filled 2022-09-17: qty 1

## 2022-09-17 MED ORDER — SODIUM CHLORIDE 0.9 % IV SOLN
10.0000 mg | Freq: Once | INTRAVENOUS | Status: AC
  Administered 2022-09-17: 10 mg via INTRAVENOUS
  Filled 2022-09-17: qty 10

## 2022-09-17 MED ORDER — ACETAMINOPHEN 500 MG PO TABS
1000.0000 mg | ORAL_TABLET | Freq: Once | ORAL | Status: AC
  Administered 2022-09-17: 1000 mg via ORAL
  Filled 2022-09-17: qty 2

## 2022-09-17 NOTE — Patient Instructions (Signed)
[facility]  Discharge Instructions: Thank you for choosing [facility] to provide your oncology and hematology care.   If you have a lab appointment with the [facility], please go directly to the [facility] and check in at the registration area.   Wear comfortable clothing and clothing appropriate for easy access to any Portacath or PICC line.   We strive to give you quality time with your provider. You may need to reschedule your appointment if you arrive late (15 or more minutes).  Arriving late affects you and other patients whose appointments are after yours.  Also, if you miss three or more appointments without notifying the office, you may be dismissed from the clinic at the provider's discretion.      For prescription refill requests, have your pharmacy contact our office and allow 72 hours for refills to be completed.    Today you received the following chemotherapy and/or immunotherapy agents carflizomib      To help prevent nausea and vomiting after your treatment, we encourage you to take your nausea medication as directed.  BELOW ARE SYMPTOMS THAT SHOULD BE REPORTED IMMEDIATELY: *FEVER GREATER THAN 100.4 F (38 C) OR HIGHER *CHILLS OR SWEATING *NAUSEA AND VOMITING THAT IS NOT CONTROLLED WITH YOUR NAUSEA MEDICATION *UNUSUAL SHORTNESS OF BREATH *UNUSUAL BRUISING OR BLEEDING *URINARY PROBLEMS (pain or burning when urinating, or frequent urination) *BOWEL PROBLEMS (unusual diarrhea, constipation, pain near the anus) TENDERNESS IN MOUTH AND THROAT WITH OR WITHOUT PRESENCE OF ULCERS (sore throat, sores in mouth, or a toothache) UNUSUAL RASH, SWELLING OR PAIN  UNUSUAL VAGINAL DISCHARGE OR ITCHING   Items with * indicate a potential emergency and should be followed up as soon as possible or go to the Emergency Department if any problems should occur.  Please show the CHEMOTHERAPY ALERT CARD or IMMUNOTHERAPY ALERT CARD at  check-in to the Emergency Department and triage nurse.  Should you have questions after your visit or need to cancel or reschedule your appointment, please contact [facility]  Dept: [phone]  and follow the prompts.  Office hours are 8:00 a.m. to 4:30 p.m. Monday - Friday. Please note that voicemails left after 4:00 p.m. may not be returned until the following business day.  We are closed weekends and major holidays. You have access to a nurse at all times for urgent questions. Please call the main number to the clinic Dept: [phone] and follow the prompts.   For any non-urgent questions, you may also contact your provider using MyChart. We now offer e-Visits for anyone 50 and older to request care online for non-urgent symptoms. For details visit [email or web address].   Also download the MyChart app! Go to the app store, search "MyChart", open the app, select [facility], and log in with your MyChart username and password.

## 2022-09-19 ENCOUNTER — Other Ambulatory Visit: Payer: Self-pay

## 2022-09-25 ENCOUNTER — Encounter: Payer: Self-pay | Admitting: Hematology

## 2022-09-25 ENCOUNTER — Ambulatory Visit
Admission: RE | Admit: 2022-09-25 | Discharge: 2022-09-25 | Disposition: A | Discharge status: Home / Self Care | Payer: Medicare Other | Source: Ambulatory Visit | Attending: Internal Medicine | Admitting: Internal Medicine

## 2022-09-25 ENCOUNTER — Other Ambulatory Visit: Payer: Self-pay

## 2022-09-25 DIAGNOSIS — C9001 Multiple myeloma in remission: Secondary | ICD-10-CM

## 2022-09-27 ENCOUNTER — Other Ambulatory Visit: Payer: Self-pay

## 2022-09-27 ENCOUNTER — Encounter: Payer: Self-pay | Admitting: Hematology

## 2022-09-27 DIAGNOSIS — C9000 Multiple myeloma not having achieved remission: Secondary | ICD-10-CM

## 2022-09-27 MED ORDER — TRAZODONE HCL 50 MG PO TABS: 50.0000 mg | ORAL_TABLET | Freq: Every evening | ORAL | 1 refills | Status: DC | PRN

## 2022-09-27 MED ORDER — ERGOCALCIFEROL 1.25 MG (50000 UT) PO CAPS: 50000.0000 [IU] | ORAL_CAPSULE | ORAL | 3 refills | Status: DC

## 2022-09-30 MED FILL — Dexamethasone Sodium Phosphate Inj 100 MG/10ML: INTRAMUSCULAR | Qty: 1 | Status: AC

## 2022-10-01 ENCOUNTER — Inpatient Hospital Stay: Payer: Medicare Other | Attending: Hematology

## 2022-10-01 ENCOUNTER — Other Ambulatory Visit: Payer: Self-pay

## 2022-10-01 ENCOUNTER — Inpatient Hospital Stay: Payer: Medicare Other

## 2022-10-01 VITALS — BP 120/83 | HR 80 | Temp 98.2°F | Resp 18 | Wt 201.2 lb

## 2022-10-01 DIAGNOSIS — C9000 Multiple myeloma not having achieved remission: Secondary | ICD-10-CM | POA: Diagnosis not present

## 2022-10-01 DIAGNOSIS — Z5112 Encounter for antineoplastic immunotherapy: Secondary | ICD-10-CM | POA: Insufficient documentation

## 2022-10-01 DIAGNOSIS — Z95828 Presence of other vascular implants and grafts: Secondary | ICD-10-CM

## 2022-10-01 DIAGNOSIS — Z7189 Other specified counseling: Secondary | ICD-10-CM

## 2022-10-01 LAB — CMP (CANCER CENTER ONLY)
ALT: 13 U/L (ref 0–44)
AST: 21 U/L (ref 15–41)
Albumin: 3.9 g/dL (ref 3.5–5.0)
Alkaline Phosphatase: 54 U/L (ref 38–126)
Anion gap: 9 (ref 5–15)
BUN: 20 mg/dL (ref 8–23)
CO2: 25 mmol/L (ref 22–32)
Calcium: 9.7 mg/dL (ref 8.9–10.3)
Chloride: 106 mmol/L (ref 98–111)
Creatinine: 1.52 mg/dL — ABNORMAL HIGH (ref 0.61–1.24)
GFR, Estimated: 47 mL/min — ABNORMAL LOW (ref >60)
Glucose, Bld: 156 mg/dL — ABNORMAL HIGH (ref 70–99)
Potassium: 3.7 mmol/L (ref 3.5–5.1)
Sodium: 140 mmol/L (ref 135–145)
Total Bilirubin: 0.5 mg/dL (ref 0.3–1.2)
Total Protein: 5.8 g/dL — ABNORMAL LOW (ref 6.5–8.1)

## 2022-10-01 LAB — CBC WITH DIFFERENTIAL (CANCER CENTER ONLY)
Abs Immature Granulocytes: 0.01 10*3/uL (ref 0.00–0.07)
Basophils Absolute: 0 10*3/uL (ref 0.0–0.1)
Basophils Relative: 1 %
Eosinophils Absolute: 0.1 10*3/uL (ref 0.0–0.5)
Eosinophils Relative: 3 %
HCT: 33.3 % — ABNORMAL LOW (ref 39.0–52.0)
Hemoglobin: 11.6 g/dL — ABNORMAL LOW (ref 13.0–17.0)
Immature Granulocytes: 0 %
Lymphocytes Relative: 26 %
Lymphs Abs: 0.9 10*3/uL (ref 0.7–4.0)
MCH: 36.8 pg — ABNORMAL HIGH (ref 26.0–34.0)
MCHC: 34.8 g/dL (ref 30.0–36.0)
MCV: 105.7 fL — ABNORMAL HIGH (ref 80.0–100.0)
Monocytes Absolute: 0.3 10*3/uL (ref 0.1–1.0)
Monocytes Relative: 9 %
Neutro Abs: 2.2 10*3/uL (ref 1.7–7.7)
Neutrophils Relative %: 61 %
Platelet Count: 146 10*3/uL — ABNORMAL LOW (ref 150–400)
RBC: 3.15 MIL/uL — ABNORMAL LOW (ref 4.22–5.81)
RDW: 13.2 % (ref 11.5–15.5)
WBC Count: 3.5 10*3/uL — ABNORMAL LOW (ref 4.0–10.5)
nRBC: 0 % (ref 0.0–0.2)

## 2022-10-01 MED ORDER — SODIUM CHLORIDE 0.9% FLUSH
10.0000 mL | Freq: Once | INTRAVENOUS | Status: AC
  Administered 2022-10-01: 10 mL

## 2022-10-01 MED ORDER — ACETAMINOPHEN 500 MG PO TABS
1000.0000 mg | ORAL_TABLET | Freq: Once | ORAL | Status: AC
  Administered 2022-10-01: 1000 mg via ORAL
  Filled 2022-10-01: qty 2

## 2022-10-01 MED ORDER — HEPARIN SOD (PORK) LOCK FLUSH 100 UNIT/ML IV SOLN
500.0000 [IU] | Freq: Once | INTRAVENOUS | Status: AC | PRN
  Administered 2022-10-01: 500 [IU]

## 2022-10-01 MED ORDER — SODIUM CHLORIDE 0.9 % IV SOLN: Freq: Once | INTRAVENOUS | Status: AC

## 2022-10-01 MED ORDER — SODIUM CHLORIDE 0.9 % IV SOLN
10.0000 mg | Freq: Once | INTRAVENOUS | Status: AC
  Administered 2022-10-01: 10 mg via INTRAVENOUS
  Filled 2022-10-01: qty 10

## 2022-10-01 MED ORDER — SODIUM CHLORIDE 0.9 % IV SOLN
60.0000 mg | Freq: Once | INTRAVENOUS | Status: AC
  Administered 2022-10-01: 60 mg via INTRAVENOUS
  Filled 2022-10-01: qty 20

## 2022-10-01 MED ORDER — PROCHLORPERAZINE MALEATE 10 MG PO TABS
10.0000 mg | ORAL_TABLET | Freq: Once | ORAL | Status: AC
  Administered 2022-10-01: 10 mg via ORAL
  Filled 2022-10-01: qty 1

## 2022-10-01 MED ORDER — DEXTROSE 5 % IV SOLN
56.0000 mg/m2 | Freq: Once | INTRAVENOUS | Status: AC
  Administered 2022-10-01: 120 mg via INTRAVENOUS
  Filled 2022-10-01: qty 60

## 2022-10-01 MED ORDER — SODIUM CHLORIDE 0.9% FLUSH
10.0000 mL | INTRAVENOUS | Status: DC | PRN
  Administered 2022-10-01: 10 mL

## 2022-10-01 MED ORDER — SODIUM CHLORIDE 0.9 % IV SOLN: Freq: Once | INTRAVENOUS | Status: DC

## 2022-10-01 NOTE — Patient Instructions (Signed)
[facility]  Discharge Instructions: Thank you for choosing [facility] to provide your oncology and hematology care.   If you have a lab appointment with the Cancer Center, please go directly to the Cancer Center and check in at the registration area.   Wear comfortable clothing and clothing appropriate for easy access to any Portacath or PICC line.   We strive to give you quality time with your provider. You may need to reschedule your appointment if you arrive late (15 or more minutes).  Arriving late affects you and other patients whose appointments are after yours.  Also, if you miss three or more appointments without notifying the office, you may be dismissed from the clinic at the provider's discretion.      For prescription refill requests, have your pharmacy contact our office and allow 72 hours for refills to be completed.    Today you received the following chemotherapy and/or immunotherapy agents :  Kyprolis   To help prevent nausea and vomiting after your treatment, we encourage you to take your nausea medication as directed.  BELOW ARE SYMPTOMS THAT SHOULD BE REPORTED IMMEDIATELY: *FEVER GREATER THAN 100.4 F (38 C) OR HIGHER *CHILLS OR SWEATING *NAUSEA AND VOMITING THAT IS NOT CONTROLLED WITH YOUR NAUSEA MEDICATION *UNUSUAL SHORTNESS OF BREATH *UNUSUAL BRUISING OR BLEEDING *URINARY PROBLEMS (pain or burning when urinating, or frequent urination) *BOWEL PROBLEMS (unusual diarrhea, constipation, pain near the anus) TENDERNESS IN MOUTH AND THROAT WITH OR WITHOUT PRESENCE OF ULCERS (sore throat, sores in mouth, or a toothache) UNUSUAL RASH, SWELLING OR PAIN  UNUSUAL VAGINAL DISCHARGE OR ITCHING   Items with * indicate a potential emergency and should be followed up as soon as possible or go to the Emergency Department if any problems should occur.  Please show the CHEMOTHERAPY ALERT CARD or IMMUNOTHERAPY ALERT CARD at  check-in to the Emergency Department and triage nurse.  Should you have questions after your visit or need to cancel or reschedule your appointment, please contact [facility]  Dept: [phone]  and follow the prompts.  Office hours are 8:00 a.m. to 4:30 p.m. Monday - Friday. Please note that voicemails left after 4:00 p.m. may not be returned until the following business day.  We are closed weekends and major holidays. You have access to a nurse at all times for urgent questions. Please call the main number to the clinic Dept: [phone] and follow the prompts.   For any non-urgent questions, you may also contact your provider using MyChart. We now offer e-Visits for anyone 18 and older to request care online for non-urgent symptoms. For details visit [email or web address].   Also download the MyChart app! Go to the app store, search "MyChart", open the app, select  [facility], and log in with your MyChart username and password.   

## 2022-10-01 NOTE — Progress Notes (Signed)
Ok to treat with creat 1.52 mg/dL per Dr Irene Limbo

## 2022-10-02 ENCOUNTER — Other Ambulatory Visit: Payer: Self-pay

## 2022-10-02 ENCOUNTER — Encounter: Payer: Self-pay | Admitting: Hematology

## 2022-10-02 DIAGNOSIS — C9000 Multiple myeloma not having achieved remission: Secondary | ICD-10-CM

## 2022-10-02 MED ORDER — ERGOCALCIFEROL 1.25 MG (50000 UT) PO CAPS: 50000.0000 [IU] | ORAL_CAPSULE | ORAL | 3 refills | Status: DC

## 2022-10-02 MED ORDER — FENTANYL 12 MCG/HR TD PT72: 1.0000 | MEDICATED_PATCH | TRANSDERMAL | 0 refills | Status: DC

## 2022-10-04 LAB — MULTIPLE MYELOMA PANEL, SERUM
Albumin SerPl Elph-Mcnc: 3.5 g/dL (ref 2.9–4.4)
Albumin/Glob SerPl: 2 — ABNORMAL HIGH (ref 0.7–1.7)
Alpha 1: 0.2 g/dL (ref 0.0–0.4)
Alpha2 Glob SerPl Elph-Mcnc: 0.7 g/dL (ref 0.4–1.0)
B-Globulin SerPl Elph-Mcnc: 0.7 g/dL (ref 0.7–1.3)
Gamma Glob SerPl Elph-Mcnc: 0.2 g/dL — ABNORMAL LOW (ref 0.4–1.8)
Globulin, Total: 1.8 g/dL — ABNORMAL LOW (ref 2.2–3.9)
IgA: 14 mg/dL — ABNORMAL LOW (ref 61–437)
IgG (Immunoglobin G), Serum: 197 mg/dL — ABNORMAL LOW (ref 603–1613)
IgM (Immunoglobulin M), Srm: 7 mg/dL — ABNORMAL LOW (ref 15–143)
Total Protein ELP: 5.3 g/dL — ABNORMAL LOW (ref 6.0–8.5)

## 2022-10-07 ENCOUNTER — Other Ambulatory Visit: Payer: Self-pay

## 2022-10-07 ENCOUNTER — Encounter: Payer: Self-pay | Admitting: Hematology

## 2022-10-07 DIAGNOSIS — C9001 Multiple myeloma in remission: Secondary | ICD-10-CM

## 2022-10-08 ENCOUNTER — Encounter: Payer: Self-pay | Admitting: Hematology

## 2022-10-08 MED ORDER — TRAMADOL HCL 50 MG PO TABS: 50.0000 mg | ORAL_TABLET | Freq: Four times a day (QID) | ORAL | 0 refills | Status: DC | PRN

## 2022-10-11 ENCOUNTER — Other Ambulatory Visit: Payer: Self-pay

## 2022-10-11 DIAGNOSIS — C9000 Multiple myeloma not having achieved remission: Secondary | ICD-10-CM

## 2022-10-14 MED FILL — Dexamethasone Sodium Phosphate Inj 100 MG/10ML: INTRAMUSCULAR | Qty: 1 | Status: AC

## 2022-10-15 ENCOUNTER — Inpatient Hospital Stay: Payer: Medicare Other

## 2022-10-15 VITALS — BP 137/87 | HR 73 | Temp 98.0°F | Resp 17 | Wt 196.5 lb

## 2022-10-15 DIAGNOSIS — Z5112 Encounter for antineoplastic immunotherapy: Secondary | ICD-10-CM | POA: Diagnosis not present

## 2022-10-15 DIAGNOSIS — C9000 Multiple myeloma not having achieved remission: Secondary | ICD-10-CM

## 2022-10-15 DIAGNOSIS — Z95828 Presence of other vascular implants and grafts: Secondary | ICD-10-CM

## 2022-10-15 DIAGNOSIS — Z7189 Other specified counseling: Secondary | ICD-10-CM

## 2022-10-15 LAB — CBC WITH DIFFERENTIAL (CANCER CENTER ONLY)
Abs Immature Granulocytes: 0 10*3/uL (ref 0.00–0.07)
Basophils Absolute: 0 10*3/uL (ref 0.0–0.1)
Basophils Relative: 1 %
Eosinophils Absolute: 0.1 10*3/uL (ref 0.0–0.5)
Eosinophils Relative: 2 %
HCT: 35.4 % — ABNORMAL LOW (ref 39.0–52.0)
Hemoglobin: 12 g/dL — ABNORMAL LOW (ref 13.0–17.0)
Immature Granulocytes: 0 %
Lymphocytes Relative: 25 %
Lymphs Abs: 1.3 10*3/uL (ref 0.7–4.0)
MCH: 35.7 pg — ABNORMAL HIGH (ref 26.0–34.0)
MCHC: 33.9 g/dL (ref 30.0–36.0)
MCV: 105.4 fL — ABNORMAL HIGH (ref 80.0–100.0)
Monocytes Absolute: 0.4 10*3/uL (ref 0.1–1.0)
Monocytes Relative: 8 %
Neutro Abs: 3.3 10*3/uL (ref 1.7–7.7)
Neutrophils Relative %: 64 %
Platelet Count: 162 10*3/uL (ref 150–400)
RBC: 3.36 MIL/uL — ABNORMAL LOW (ref 4.22–5.81)
RDW: 13.3 % (ref 11.5–15.5)
WBC Count: 5.2 10*3/uL (ref 4.0–10.5)
nRBC: 0 % (ref 0.0–0.2)

## 2022-10-15 LAB — CMP (CANCER CENTER ONLY)
ALT: 16 U/L (ref 0–44)
AST: 23 U/L (ref 15–41)
Albumin: 4.2 g/dL (ref 3.5–5.0)
Alkaline Phosphatase: 52 U/L (ref 38–126)
Anion gap: 6 (ref 5–15)
BUN: 27 mg/dL — ABNORMAL HIGH (ref 8–23)
CO2: 26 mmol/L (ref 22–32)
Calcium: 9.8 mg/dL (ref 8.9–10.3)
Chloride: 105 mmol/L (ref 98–111)
Creatinine: 1.54 mg/dL — ABNORMAL HIGH (ref 0.61–1.24)
GFR, Estimated: 47 mL/min — ABNORMAL LOW (ref >60)
Glucose, Bld: 153 mg/dL — ABNORMAL HIGH (ref 70–99)
Potassium: 3.9 mmol/L (ref 3.5–5.1)
Sodium: 137 mmol/L (ref 135–145)
Total Bilirubin: 0.7 mg/dL (ref 0.3–1.2)
Total Protein: 5.9 g/dL — ABNORMAL LOW (ref 6.5–8.1)

## 2022-10-15 MED ORDER — SODIUM CHLORIDE 0.9% FLUSH
10.0000 mL | INTRAVENOUS | Status: DC | PRN
  Administered 2022-10-15: 10 mL

## 2022-10-15 MED ORDER — SODIUM CHLORIDE 0.9 % IV SOLN
10.0000 mg | Freq: Once | INTRAVENOUS | Status: AC
  Administered 2022-10-15: 10 mg via INTRAVENOUS
  Filled 2022-10-15: qty 10

## 2022-10-15 MED ORDER — SODIUM CHLORIDE 0.9 % IV SOLN: Freq: Once | INTRAVENOUS | Status: AC

## 2022-10-15 MED ORDER — ACETAMINOPHEN 500 MG PO TABS
1000.0000 mg | ORAL_TABLET | Freq: Once | ORAL | Status: AC
  Administered 2022-10-15: 1000 mg via ORAL
  Filled 2022-10-15: qty 2

## 2022-10-15 MED ORDER — DEXTROSE 5 % IV SOLN
56.0000 mg/m2 | Freq: Once | INTRAVENOUS | Status: AC
  Administered 2022-10-15: 120 mg via INTRAVENOUS
  Filled 2022-10-15: qty 60

## 2022-10-15 MED ORDER — HEPARIN SOD (PORK) LOCK FLUSH 100 UNIT/ML IV SOLN
500.0000 [IU] | Freq: Once | INTRAVENOUS | Status: AC | PRN
  Administered 2022-10-15: 500 [IU]

## 2022-10-15 MED ORDER — SODIUM CHLORIDE 0.9% FLUSH
10.0000 mL | Freq: Once | INTRAVENOUS | Status: AC
  Administered 2022-10-15: 10 mL

## 2022-10-15 MED ORDER — PROCHLORPERAZINE MALEATE 10 MG PO TABS
10.0000 mg | ORAL_TABLET | Freq: Once | ORAL | Status: AC
  Administered 2022-10-15: 10 mg via ORAL
  Filled 2022-10-15: qty 1

## 2022-10-15 NOTE — Progress Notes (Signed)
Per PA Murray Hodgkins, OK to proceed with tx with crt 1.54.

## 2022-10-16 ENCOUNTER — Encounter: Payer: Self-pay | Admitting: Hematology

## 2022-10-16 ENCOUNTER — Other Ambulatory Visit: Payer: Self-pay

## 2022-10-16 DIAGNOSIS — C9000 Multiple myeloma not having achieved remission: Secondary | ICD-10-CM

## 2022-10-16 MED ORDER — LIDOCAINE-PRILOCAINE 2.5-2.5 % EX CREA: 1.0000 | TOPICAL_CREAM | CUTANEOUS | 0 refills | Status: DC | PRN

## 2022-10-25 ENCOUNTER — Telehealth: Payer: Self-pay

## 2022-10-25 ENCOUNTER — Other Ambulatory Visit: Payer: Self-pay

## 2022-10-25 DIAGNOSIS — C9000 Multiple myeloma not having achieved remission: Secondary | ICD-10-CM

## 2022-10-25 NOTE — Telephone Encounter (Signed)
Returned call to pt. Pt states 2 nights ago he had a "really " leg cramp from his thigh down to his foot that lasted quite some time. Pt was unable to stand on his leg during that time. He wanted Dr Candise Che to be made aware . Will update Dr Candise Che. Pt will discuss with him at upcoming appointment.

## 2022-10-28 ENCOUNTER — Other Ambulatory Visit: Payer: Self-pay

## 2022-10-28 DIAGNOSIS — C9000 Multiple myeloma not having achieved remission: Secondary | ICD-10-CM

## 2022-10-28 MED FILL — Dexamethasone Sodium Phosphate Inj 100 MG/10ML: INTRAMUSCULAR | Qty: 1 | Status: AC

## 2022-10-29 ENCOUNTER — Other Ambulatory Visit: Payer: Self-pay

## 2022-10-29 ENCOUNTER — Other Ambulatory Visit: Payer: Self-pay | Admitting: Hematology

## 2022-10-29 ENCOUNTER — Inpatient Hospital Stay: Payer: Medicare Other

## 2022-10-29 ENCOUNTER — Inpatient Hospital Stay (HOSPITAL_BASED_OUTPATIENT_CLINIC_OR_DEPARTMENT_OTHER): Payer: Medicare Other | Admitting: Hematology

## 2022-10-29 ENCOUNTER — Inpatient Hospital Stay: Payer: Medicare Other | Attending: Hematology

## 2022-10-29 VITALS — BP 137/86 | HR 73 | Temp 97.9°F | Resp 16

## 2022-10-29 VITALS — BP 122/82 | HR 76 | Temp 97.5°F | Resp 18 | Wt 198.5 lb

## 2022-10-29 DIAGNOSIS — C9000 Multiple myeloma not having achieved remission: Secondary | ICD-10-CM | POA: Diagnosis not present

## 2022-10-29 DIAGNOSIS — Z95828 Presence of other vascular implants and grafts: Secondary | ICD-10-CM

## 2022-10-29 DIAGNOSIS — Z5111 Encounter for antineoplastic chemotherapy: Secondary | ICD-10-CM | POA: Diagnosis not present

## 2022-10-29 DIAGNOSIS — C9001 Multiple myeloma in remission: Secondary | ICD-10-CM

## 2022-10-29 DIAGNOSIS — Z7189 Other specified counseling: Secondary | ICD-10-CM

## 2022-10-29 DIAGNOSIS — Z5112 Encounter for antineoplastic immunotherapy: Secondary | ICD-10-CM | POA: Insufficient documentation

## 2022-10-29 LAB — CBC WITH DIFFERENTIAL (CANCER CENTER ONLY)
Abs Immature Granulocytes: 0.01 10*3/uL (ref 0.00–0.07)
Basophils Absolute: 0 10*3/uL (ref 0.0–0.1)
Basophils Relative: 1 %
Eosinophils Absolute: 0.1 10*3/uL (ref 0.0–0.5)
Eosinophils Relative: 2 %
HCT: 33.9 % — ABNORMAL LOW (ref 39.0–52.0)
Hemoglobin: 11.5 g/dL — ABNORMAL LOW (ref 13.0–17.0)
Immature Granulocytes: 0 %
Lymphocytes Relative: 26 %
Lymphs Abs: 1.1 10*3/uL (ref 0.7–4.0)
MCH: 36.5 pg — ABNORMAL HIGH (ref 26.0–34.0)
MCHC: 33.9 g/dL (ref 30.0–36.0)
MCV: 107.6 fL — ABNORMAL HIGH (ref 80.0–100.0)
Monocytes Absolute: 0.4 10*3/uL (ref 0.1–1.0)
Monocytes Relative: 8 %
Neutro Abs: 2.7 10*3/uL (ref 1.7–7.7)
Neutrophils Relative %: 63 %
Platelet Count: 139 10*3/uL — ABNORMAL LOW (ref 150–400)
RBC: 3.15 MIL/uL — ABNORMAL LOW (ref 4.22–5.81)
RDW: 13.8 % (ref 11.5–15.5)
WBC Count: 4.3 10*3/uL (ref 4.0–10.5)
nRBC: 0 % (ref 0.0–0.2)

## 2022-10-29 LAB — CMP (CANCER CENTER ONLY)
ALT: 18 U/L (ref 0–44)
AST: 23 U/L (ref 15–41)
Albumin: 3.9 g/dL (ref 3.5–5.0)
Alkaline Phosphatase: 61 U/L (ref 38–126)
Anion gap: 6 (ref 5–15)
BUN: 20 mg/dL (ref 8–23)
CO2: 27 mmol/L (ref 22–32)
Calcium: 9.5 mg/dL (ref 8.9–10.3)
Chloride: 108 mmol/L (ref 98–111)
Creatinine: 1.48 mg/dL — ABNORMAL HIGH (ref 0.61–1.24)
GFR, Estimated: 49 mL/min — ABNORMAL LOW (ref >60)
Glucose, Bld: 129 mg/dL — ABNORMAL HIGH (ref 70–99)
Potassium: 3.8 mmol/L (ref 3.5–5.1)
Sodium: 141 mmol/L (ref 135–145)
Total Bilirubin: 0.6 mg/dL (ref 0.3–1.2)
Total Protein: 5.5 g/dL — ABNORMAL LOW (ref 6.5–8.1)

## 2022-10-29 LAB — MAGNESIUM: Magnesium: 1.9 mg/dL (ref 1.7–2.4)

## 2022-10-29 MED ORDER — PROCHLORPERAZINE MALEATE 10 MG PO TABS
10.0000 mg | ORAL_TABLET | Freq: Once | ORAL | Status: AC
  Administered 2022-10-29: 10 mg via ORAL
  Filled 2022-10-29: qty 1

## 2022-10-29 MED ORDER — SODIUM CHLORIDE 0.9% FLUSH
10.0000 mL | INTRAVENOUS | Status: DC | PRN
  Administered 2022-10-29: 10 mL

## 2022-10-29 MED ORDER — HEPARIN SOD (PORK) LOCK FLUSH 100 UNIT/ML IV SOLN
500.0000 [IU] | Freq: Once | INTRAVENOUS | Status: AC | PRN
  Administered 2022-10-29: 500 [IU]

## 2022-10-29 MED ORDER — SODIUM CHLORIDE 0.9 % IV SOLN
10.0000 mg | Freq: Once | INTRAVENOUS | Status: AC
  Administered 2022-10-29: 10 mg via INTRAVENOUS
  Filled 2022-10-29: qty 10

## 2022-10-29 MED ORDER — SODIUM CHLORIDE 0.9 % IV SOLN: Freq: Once | INTRAVENOUS | Status: AC

## 2022-10-29 MED ORDER — DEXTROSE 5 % IV SOLN
56.0000 mg/m2 | Freq: Once | INTRAVENOUS | Status: AC
  Administered 2022-10-29: 120 mg via INTRAVENOUS
  Filled 2022-10-29: qty 60

## 2022-10-29 MED ORDER — ACETAMINOPHEN 500 MG PO TABS
1000.0000 mg | ORAL_TABLET | Freq: Once | ORAL | Status: AC
  Administered 2022-10-29: 1000 mg via ORAL
  Filled 2022-10-29: qty 2

## 2022-10-29 MED ORDER — SODIUM CHLORIDE 0.9% FLUSH
10.0000 mL | Freq: Once | INTRAVENOUS | Status: AC
  Administered 2022-10-29: 10 mL

## 2022-10-29 NOTE — Progress Notes (Unsigned)
Patient seen by Dr. Addison Naegeli are within treatment parameters.  Labs reviewed: and are within treatment parameters.  Per physician team, patient is ready for treatment and there are NO modifications to the treatment plan.
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
## 2022-10-30 ENCOUNTER — Telehealth: Payer: Self-pay | Admitting: Hematology

## 2022-10-30 ENCOUNTER — Encounter: Payer: Self-pay | Admitting: Hematology

## 2022-10-30 ENCOUNTER — Other Ambulatory Visit: Payer: Self-pay

## 2022-10-30 DIAGNOSIS — C9000 Multiple myeloma not having achieved remission: Secondary | ICD-10-CM

## 2022-10-31 ENCOUNTER — Encounter: Payer: Self-pay | Admitting: Hematology

## 2022-10-31 MED ORDER — FENTANYL 12 MCG/HR TD PT72
1.0000 | MEDICATED_PATCH | TRANSDERMAL | 0 refills | Status: DC
Start: 2022-10-31 — End: 2022-12-02

## 2022-11-01 ENCOUNTER — Other Ambulatory Visit: Payer: Self-pay

## 2022-11-01 LAB — MULTIPLE MYELOMA PANEL, SERUM
Albumin SerPl Elph-Mcnc: 3.6 g/dL (ref 2.9–4.4)
Albumin/Glob SerPl: 2.3 — ABNORMAL HIGH (ref 0.7–1.7)
Alpha 1: 0.2 g/dL (ref 0.0–0.4)
Alpha2 Glob SerPl Elph-Mcnc: 0.6 g/dL (ref 0.4–1.0)
B-Globulin SerPl Elph-Mcnc: 0.7 g/dL (ref 0.7–1.3)
Gamma Glob SerPl Elph-Mcnc: 0.1 g/dL — ABNORMAL LOW (ref 0.4–1.8)
Globulin, Total: 1.6 g/dL — ABNORMAL LOW (ref 2.2–3.9)
IgA: 17 mg/dL — ABNORMAL LOW (ref 61–437)
IgG (Immunoglobin G), Serum: 177 mg/dL — ABNORMAL LOW (ref 603–1613)
IgM (Immunoglobulin M), Srm: 12 mg/dL — ABNORMAL LOW (ref 15–143)
Total Protein ELP: 5.2 g/dL — ABNORMAL LOW (ref 6.0–8.5)

## 2022-11-04 ENCOUNTER — Encounter: Payer: Self-pay | Admitting: Hematology

## 2022-11-05 ENCOUNTER — Other Ambulatory Visit: Payer: Self-pay

## 2022-11-08 ENCOUNTER — Other Ambulatory Visit: Payer: Self-pay

## 2022-11-08 ENCOUNTER — Encounter: Payer: Self-pay | Admitting: Hematology

## 2022-11-08 DIAGNOSIS — C9001 Multiple myeloma in remission: Secondary | ICD-10-CM

## 2022-11-08 DIAGNOSIS — C9000 Multiple myeloma not having achieved remission: Secondary | ICD-10-CM

## 2022-11-08 MED ORDER — TRAMADOL HCL 50 MG PO TABS
50.0000 mg | ORAL_TABLET | Freq: Four times a day (QID) | ORAL | 0 refills | Status: DC | PRN
Start: 2022-11-08 — End: 2022-12-02

## 2022-11-10 ENCOUNTER — Other Ambulatory Visit: Payer: Self-pay

## 2022-11-11 MED FILL — Dexamethasone Sodium Phosphate Inj 100 MG/10ML: INTRAMUSCULAR | Qty: 1 | Status: AC

## 2022-11-12 ENCOUNTER — Inpatient Hospital Stay: Payer: Medicare Other

## 2022-11-12 ENCOUNTER — Other Ambulatory Visit: Payer: Self-pay | Admitting: Hematology

## 2022-11-12 VITALS — BP 139/83 | HR 63 | Temp 98.0°F | Resp 18 | Wt 197.8 lb

## 2022-11-12 DIAGNOSIS — C9000 Multiple myeloma not having achieved remission: Secondary | ICD-10-CM

## 2022-11-12 DIAGNOSIS — R252 Cramp and spasm: Secondary | ICD-10-CM

## 2022-11-12 DIAGNOSIS — Z7189 Other specified counseling: Secondary | ICD-10-CM

## 2022-11-12 DIAGNOSIS — Z5112 Encounter for antineoplastic immunotherapy: Secondary | ICD-10-CM | POA: Diagnosis not present

## 2022-11-12 DIAGNOSIS — Z95828 Presence of other vascular implants and grafts: Secondary | ICD-10-CM

## 2022-11-12 LAB — CMP (CANCER CENTER ONLY)
ALT: 23 U/L (ref 0–44)
AST: 29 U/L (ref 15–41)
Albumin: 4.1 g/dL (ref 3.5–5.0)
Alkaline Phosphatase: 56 U/L (ref 38–126)
Anion gap: 6 (ref 5–15)
BUN: 18 mg/dL (ref 8–23)
CO2: 27 mmol/L (ref 22–32)
Calcium: 9.4 mg/dL (ref 8.9–10.3)
Chloride: 107 mmol/L (ref 98–111)
Creatinine: 1.38 mg/dL — ABNORMAL HIGH (ref 0.61–1.24)
GFR, Estimated: 53 mL/min — ABNORMAL LOW (ref >60)
Glucose, Bld: 105 mg/dL — ABNORMAL HIGH (ref 70–99)
Potassium: 4.1 mmol/L (ref 3.5–5.1)
Sodium: 140 mmol/L (ref 135–145)
Total Bilirubin: 0.7 mg/dL (ref 0.3–1.2)
Total Protein: 6 g/dL — ABNORMAL LOW (ref 6.5–8.1)

## 2022-11-12 LAB — CBC WITH DIFFERENTIAL (CANCER CENTER ONLY)
Abs Immature Granulocytes: 0.02 10*3/uL (ref 0.00–0.07)
Basophils Absolute: 0 10*3/uL (ref 0.0–0.1)
Basophils Relative: 1 %
Eosinophils Absolute: 0.1 10*3/uL (ref 0.0–0.5)
Eosinophils Relative: 2 %
HCT: 35.3 % — ABNORMAL LOW (ref 39.0–52.0)
Hemoglobin: 12 g/dL — ABNORMAL LOW (ref 13.0–17.0)
Immature Granulocytes: 1 %
Lymphocytes Relative: 29 %
Lymphs Abs: 1.2 10*3/uL (ref 0.7–4.0)
MCH: 36.6 pg — ABNORMAL HIGH (ref 26.0–34.0)
MCHC: 34 g/dL (ref 30.0–36.0)
MCV: 107.6 fL — ABNORMAL HIGH (ref 80.0–100.0)
Monocytes Absolute: 0.3 10*3/uL (ref 0.1–1.0)
Monocytes Relative: 8 %
Neutro Abs: 2.5 10*3/uL (ref 1.7–7.7)
Neutrophils Relative %: 59 %
Platelet Count: 185 10*3/uL (ref 150–400)
RBC: 3.28 MIL/uL — ABNORMAL LOW (ref 4.22–5.81)
RDW: 13.6 % (ref 11.5–15.5)
WBC Count: 4.2 10*3/uL (ref 4.0–10.5)
nRBC: 0 % (ref 0.0–0.2)

## 2022-11-12 MED ORDER — TIZANIDINE HCL 2 MG PO CAPS: 2.0000 mg | ORAL_CAPSULE | Freq: Three times a day (TID) | ORAL | 0 refills | Status: DC | PRN

## 2022-11-12 MED ORDER — PROCHLORPERAZINE MALEATE 10 MG PO TABS
10.0000 mg | ORAL_TABLET | Freq: Once | ORAL | Status: AC
  Administered 2022-11-12: 10 mg via ORAL
  Filled 2022-11-12: qty 1

## 2022-11-12 MED ORDER — SODIUM CHLORIDE 0.9 % IV SOLN: Freq: Once | INTRAVENOUS | Status: AC

## 2022-11-12 MED ORDER — SODIUM CHLORIDE 0.9% FLUSH
10.0000 mL | INTRAVENOUS | Status: DC | PRN
  Administered 2022-11-12: 10 mL

## 2022-11-12 MED ORDER — ACETAMINOPHEN 500 MG PO TABS
1000.0000 mg | ORAL_TABLET | Freq: Once | ORAL | Status: AC
  Administered 2022-11-12: 1000 mg via ORAL
  Filled 2022-11-12: qty 2

## 2022-11-12 MED ORDER — SODIUM CHLORIDE 0.9% FLUSH
10.0000 mL | Freq: Once | INTRAVENOUS | Status: AC
  Administered 2022-11-12: 10 mL

## 2022-11-12 MED ORDER — SODIUM CHLORIDE 0.9 % IV SOLN
10.0000 mg | Freq: Once | INTRAVENOUS | Status: AC
  Administered 2022-11-12: 10 mg via INTRAVENOUS
  Filled 2022-11-12: qty 10

## 2022-11-12 MED ORDER — HEPARIN SOD (PORK) LOCK FLUSH 100 UNIT/ML IV SOLN
500.0000 [IU] | Freq: Once | INTRAVENOUS | Status: AC | PRN
  Administered 2022-11-12: 500 [IU]

## 2022-11-12 MED ORDER — DEXTROSE 5 % IV SOLN
56.0000 mg/m2 | Freq: Once | INTRAVENOUS | Status: AC
  Administered 2022-11-12: 120 mg via INTRAVENOUS
  Filled 2022-11-12: qty 60

## 2022-11-12 NOTE — Patient Instructions (Signed)
[facility]  Discharge Instructions: Thank you for choosing [facility] to provide your oncology and hematology care.   If you have a lab appointment with the Cancer Center, please go directly to the Cancer Center and check in at the registration area.   Wear comfortable clothing and clothing appropriate for easy access to any Portacath or PICC line.   We strive to give you quality time with your provider. You may need to reschedule your appointment if you arrive late (15 or more minutes).  Arriving late affects you and other patients whose appointments are after yours.  Also, if you miss three or more appointments without notifying the office, you may be dismissed from the clinic at the provider's discretion.      For prescription refill requests, have your pharmacy contact our office and allow 72 hours for refills to be completed.    Today you received the following chemotherapy and/or immunotherapy agents :  Kyprolis   To help prevent nausea and vomiting after your treatment, we encourage you to take your nausea medication as directed.  BELOW ARE SYMPTOMS THAT SHOULD BE REPORTED IMMEDIATELY: *FEVER GREATER THAN 100.4 F (38 C) OR HIGHER *CHILLS OR SWEATING *NAUSEA AND VOMITING THAT IS NOT CONTROLLED WITH YOUR NAUSEA MEDICATION *UNUSUAL SHORTNESS OF BREATH *UNUSUAL BRUISING OR BLEEDING *URINARY PROBLEMS (pain or burning when urinating, or frequent urination) *BOWEL PROBLEMS (unusual diarrhea, constipation, pain near the anus) TENDERNESS IN MOUTH AND THROAT WITH OR WITHOUT PRESENCE OF ULCERS (sore throat, sores in mouth, or a toothache) UNUSUAL RASH, SWELLING OR PAIN  UNUSUAL VAGINAL DISCHARGE OR ITCHING   Items with * indicate a potential emergency and should be followed up as soon as possible or go to the Emergency Department if any problems should occur.  Please show the CHEMOTHERAPY ALERT CARD or IMMUNOTHERAPY ALERT CARD at  check-in to the Emergency Department and triage nurse.  Should you have questions after your visit or need to cancel or reschedule your appointment, please contact [facility]  Dept: [phone]  and follow the prompts.  Office hours are 8:00 a.m. to 4:30 p.m. Monday - Friday. Please note that voicemails left after 4:00 p.m. may not be returned until the following business day.  We are closed weekends and major holidays. You have access to a nurse at all times for urgent questions. Please call the main number to the clinic Dept: [phone] and follow the prompts.   For any non-urgent questions, you may also contact your provider using MyChart. We now offer e-Visits for anyone 18 and older to request care online for non-urgent symptoms. For details visit [email or web address].   Also download the MyChart app! Go to the app store, search "MyChart", open the app, select [facility], and log in with your MyChart username and password.   

## 2022-11-14 ENCOUNTER — Ambulatory Visit (HOSPITAL_COMMUNITY)
Admission: RE | Admit: 2022-11-14 | Discharge: 2022-11-14 | Disposition: A | Discharge status: Home / Self Care | Payer: Medicare Other | Source: Ambulatory Visit | Attending: Hematology | Admitting: Hematology

## 2022-11-14 DIAGNOSIS — R252 Cramp and spasm: Secondary | ICD-10-CM | POA: Insufficient documentation

## 2022-11-14 NOTE — Progress Notes (Signed)
Bilateral lower extremity venous duplex has been completed. Preliminary results can be found in CV Proc through chart review.  Results were given to [facility] at Dr. Clyda Greener office.  11/14/22 9:46 AM Olen Cordial RVT

## 2022-11-17 ENCOUNTER — Other Ambulatory Visit: Payer: Self-pay | Admitting: Hematology

## 2022-11-17 DIAGNOSIS — I82419 Acute embolism and thrombosis of unspecified femoral vein: Secondary | ICD-10-CM

## 2022-11-17 DIAGNOSIS — C9000 Multiple myeloma not having achieved remission: Secondary | ICD-10-CM

## 2022-11-18 ENCOUNTER — Encounter: Payer: Self-pay | Admitting: Hematology

## 2022-11-21 IMAGING — CT CT HEAD W/O CM
4 series · 15 of 47 positions shown, 17 images · non-contrast
Comparison: None.

CLINICAL DATA: Fall 3 weeks ago. Hit back of head. Neck pain and
occasional headache since.

EXAM:
CT HEAD WITHOUT CONTRAST
CT CERVICAL SPINE WITHOUT CONTRAST
TECHNIQUE: Multidetector CT imaging of the head and cervical spine was
performed following the standard protocol without intravenous
contrast. Multiplanar CT image reconstructions of the cervical spine
were also generated.

[Series 3: head wo · axial · 0.47mm/px · z∈[-51,+69]mm · 7 of 33 slices shown, 9 images]
[im 5/33  brain]
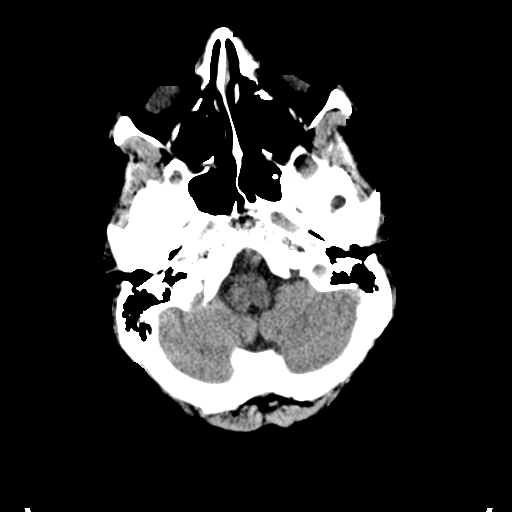
[im 5/33  bone]
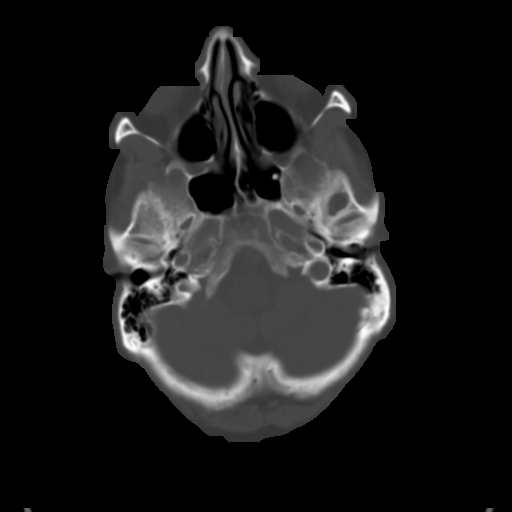
[im 9/33  brain]
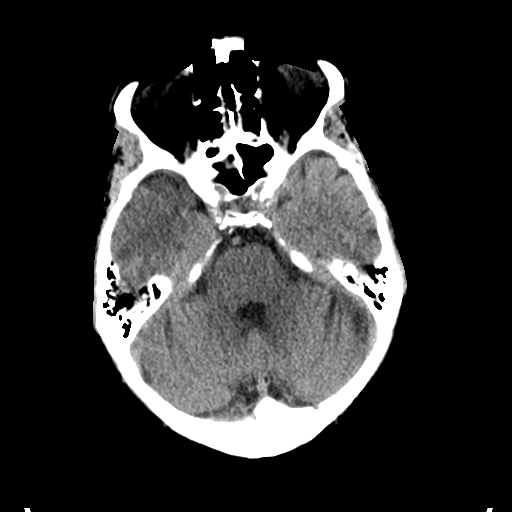
[im 13/33  brain]
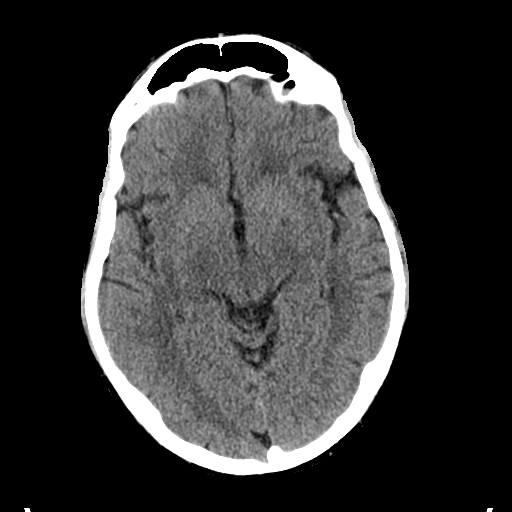
[im 17/33  brain]
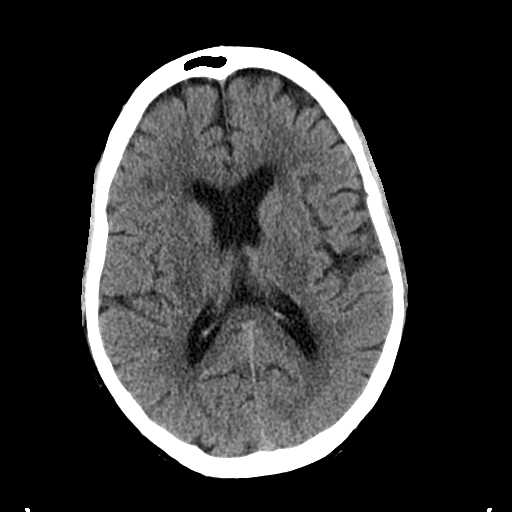
[im 21/33  brain]
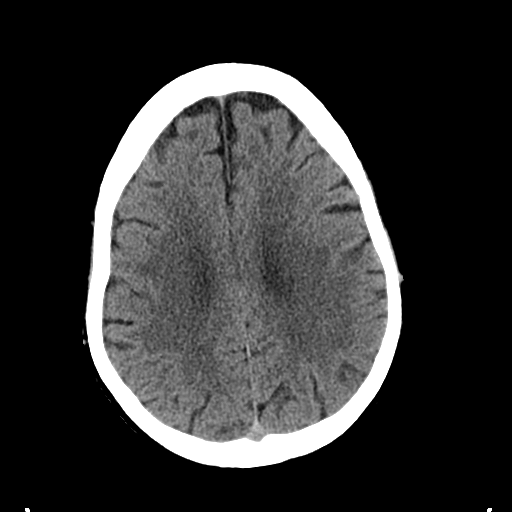
[im 21/33  bone]
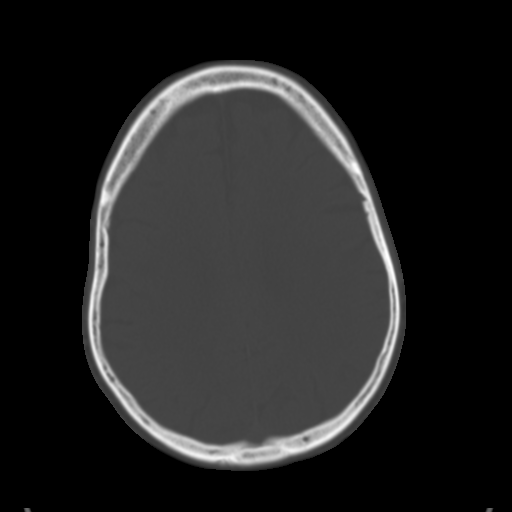
[im 25/33  brain]
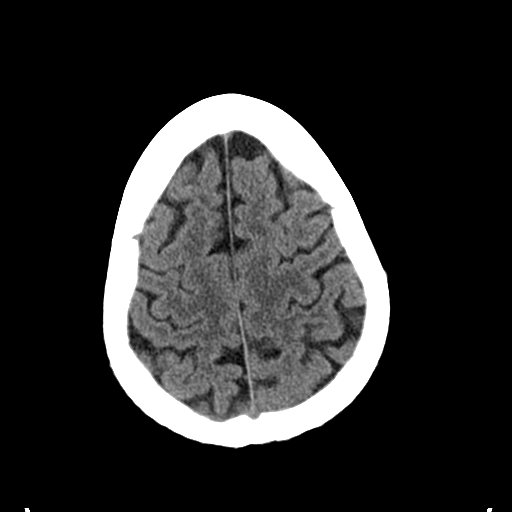
[im 29/33  brain]
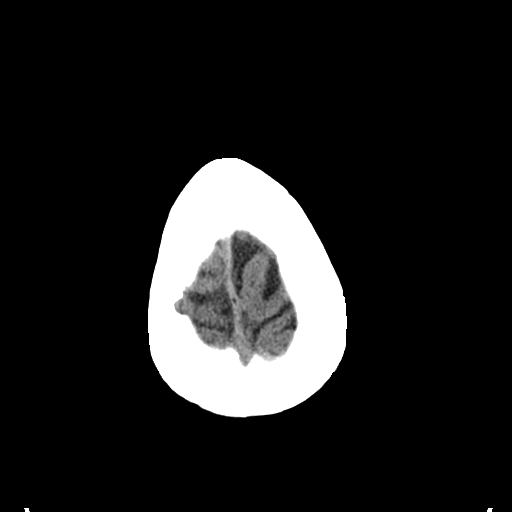

[Series 4: head bone · axial · 0.47mm/px · z∈[-55,-39]mm · 2 of 82 slices shown]
[im 9/82  bone]
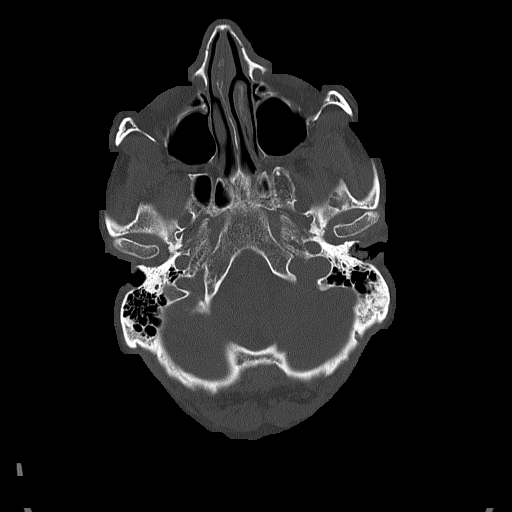
[im 17/82  bone]
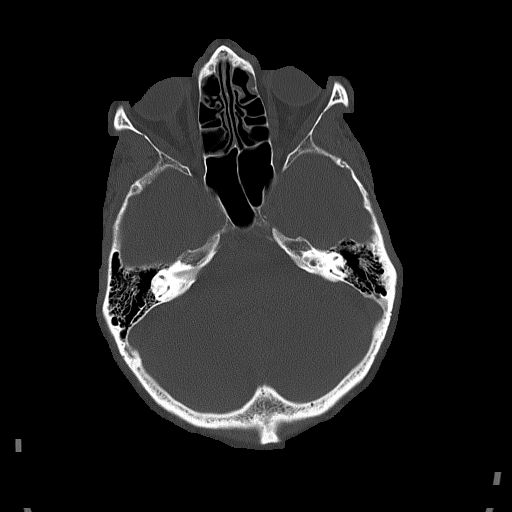

[Series 5: coronal soft tissue · coronal · 0.30mm/px · 3 of 78 slices shown]
[im 26/78  brain]
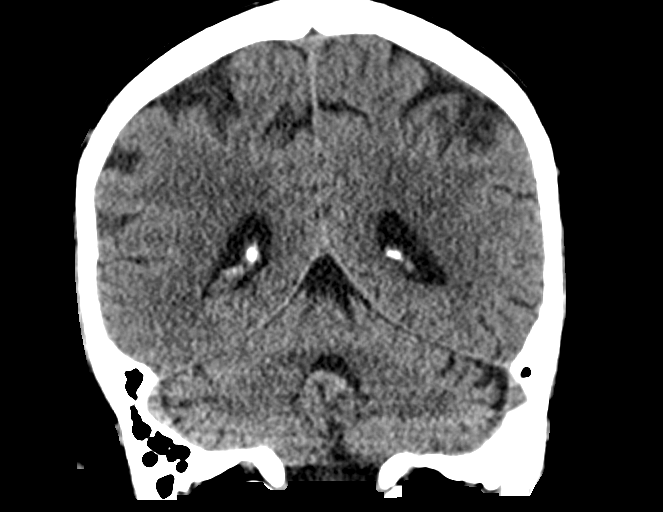
[im 35/78  brain]
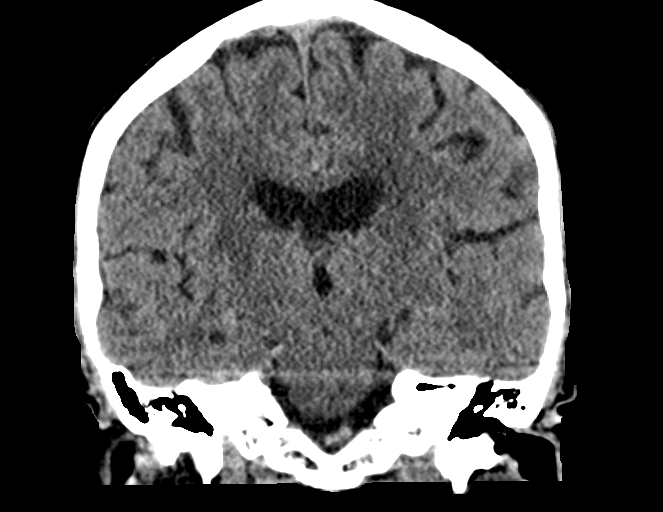
[im 43/78  brain]
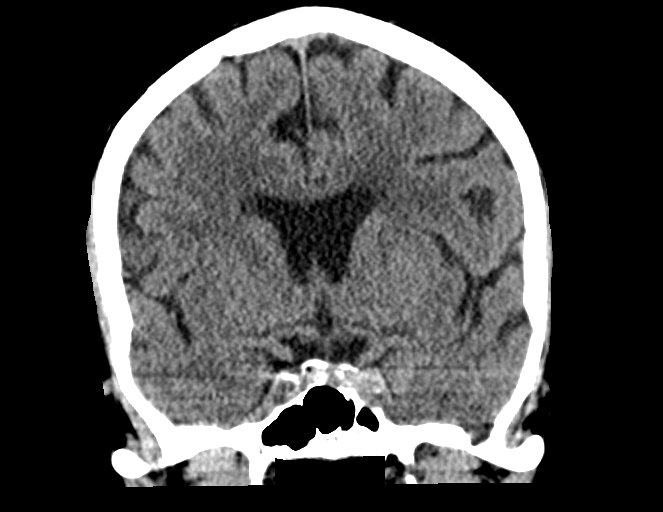

[Series 6: sagittal soft tissue · sagittal · 0.35mm/px · 3 of 59 slices shown]
[im 20/59  brain]
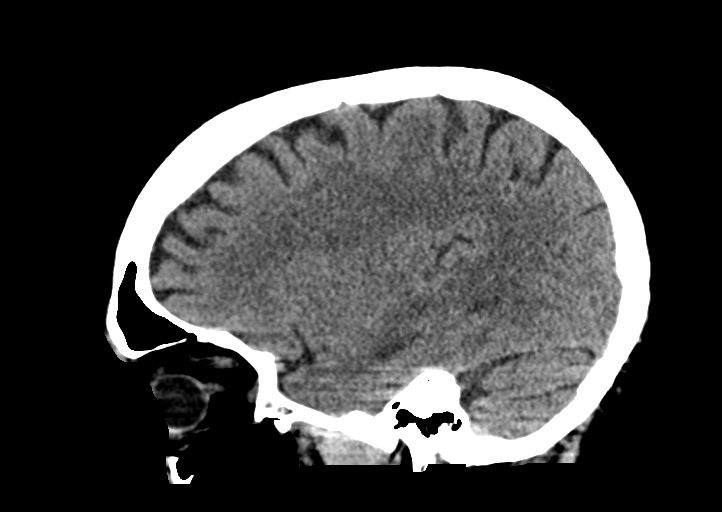
[im 30/59  brain]
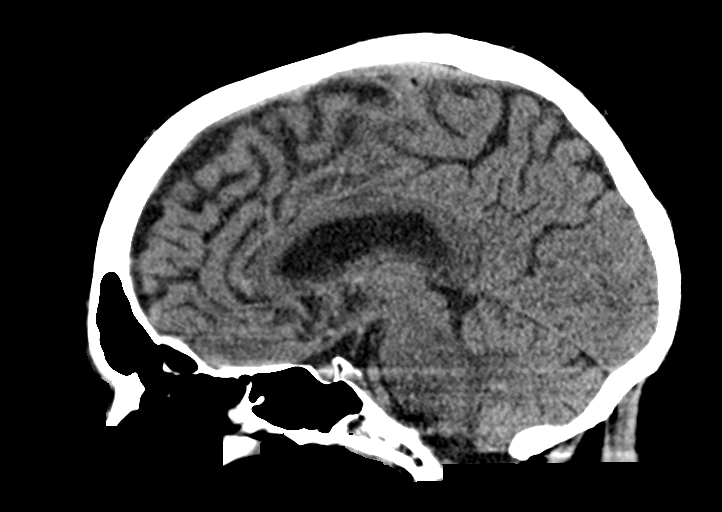
[im 39/59  brain]
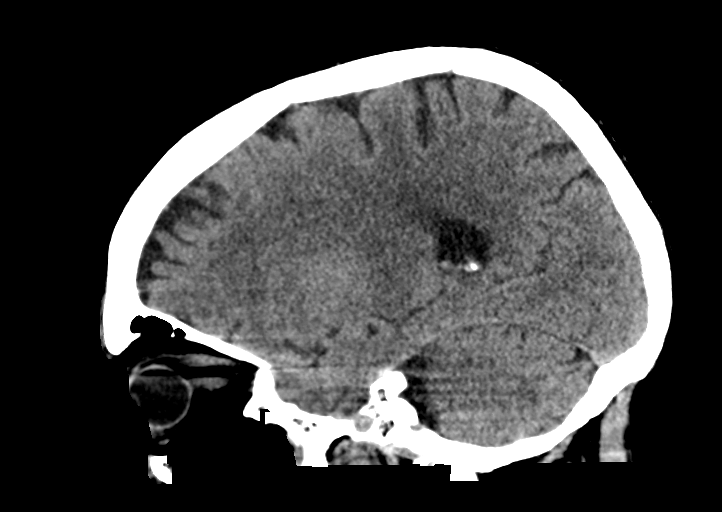

[15 of 47 positions shown; findings below may reference images not displayed]

FINDINGS: CT HEAD FINDINGS

Brain: No evidence of acute infarction, hemorrhage, hydrocephalus,
extra-axial collection or mass lesion/mass effect. Patchy moderate
white matter hypoattenuation, likely the sequela of chronic
microvascular ischemic disease.

Vascular: Calcific atherosclerosis. No hyperdense vessel identified.

Skull: No acute fracture.

Sinuses/Orbits: Mild ethmoid air cell mucosal thickening.
Unremarkable orbits.

Other: Inferior left mastoid effusion. Cerumen in the left external
auditory canal.

CT CERVICAL SPINE FINDINGS

Alignment: Approximately 2-3 mm of anterolisthesis of C4 on C5,
favored degenerative given facet arthropathy at this level. Mild
levocurvature.

Skull base and vertebrae: Vertebral body heights are maintained.

Soft tissues and spinal canal: No prevertebral fluid or swelling. No
visible canal hematoma.

Disc levels: Degenerative disc disease is greatest at C6-C7 where
there is moderate disc height loss and posterior disc osteophyte
complex. Multilevel left greater than right facet arthropathy with
varying degrees of neural foraminal stenosis.

Upper chest: Visualized lung apices are clear.

Other: Retropharyngeal course of the right carotid artery. Calcific
atherosclerosis.
IMPRESSION: CT head:

1. No evidence of acute intracranial abnormality.
2. Moderate chronic microvascular ischemic disease.

CT cervical spine:

1. No evidence of acute fracture or traumatic malalignment.
2. Degenerative disc disease (greatest at C6-C7) and multilevel left
greater than right facet arthropathy with varying degrees of neural
foraminal stenosis.

## 2022-11-21 IMAGING — CT CT CERVICAL SPINE W/O CM
3 of 4 series · 12 of 33 positions shown, 14 images · non-contrast
Comparison: None.

CLINICAL DATA: Fall 3 weeks ago. Hit back of head. Neck pain and
occasional headache since.

EXAM:
CT HEAD WITHOUT CONTRAST
CT CERVICAL SPINE WITHOUT CONTRAST
TECHNIQUE: Multidetector CT imaging of the head and cervical spine was
performed following the standard protocol without intravenous
contrast. Multiplanar CT image reconstructions of the cervical spine
were also generated.

[Series 7: sagittal bone · sagittal · 0.29mm/px · 5 of 61 slices shown, 6 images]
[im 21/61  bone]
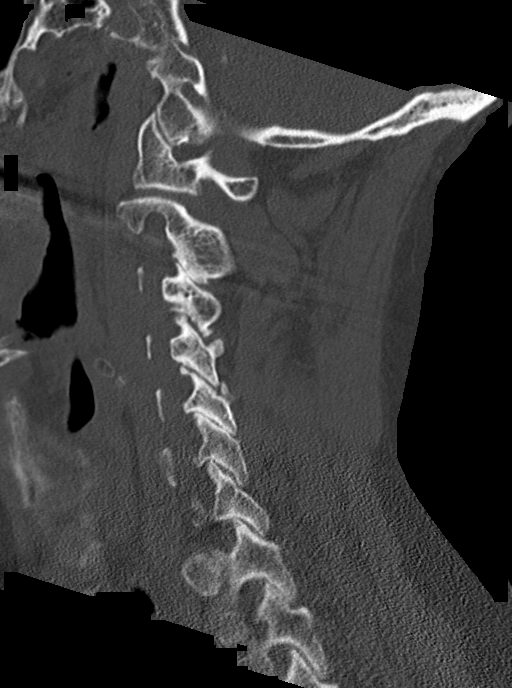
[im 26/61  bone]
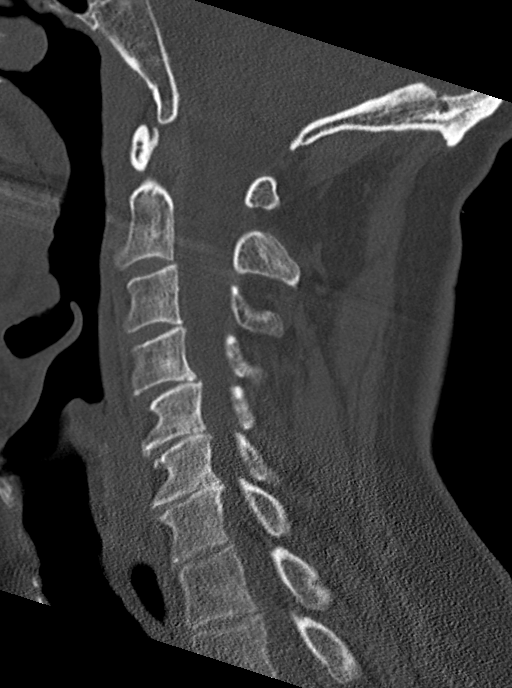
[im 31/61  soft-tissue]
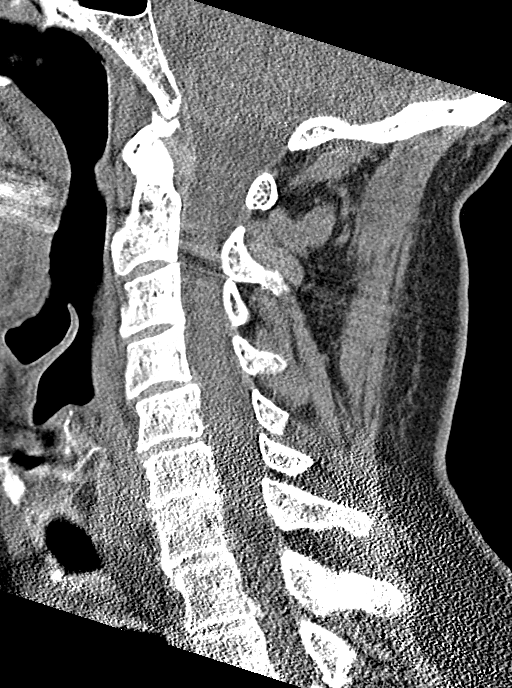
[im 31/61  bone]
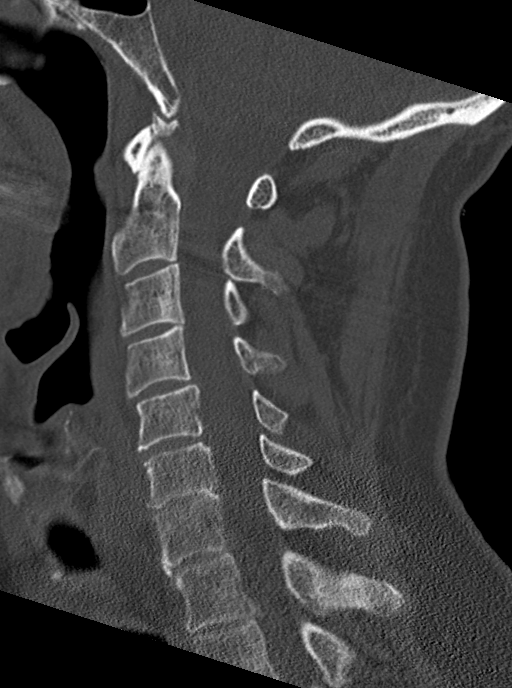
[im 36/61  bone]
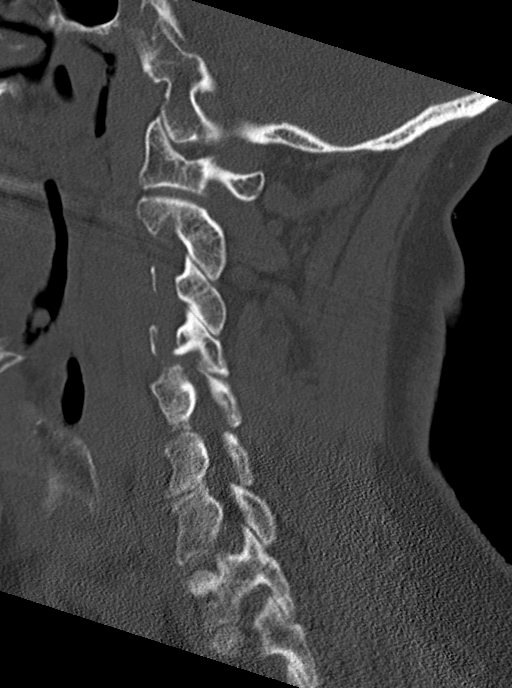
[im 41/61  bone]
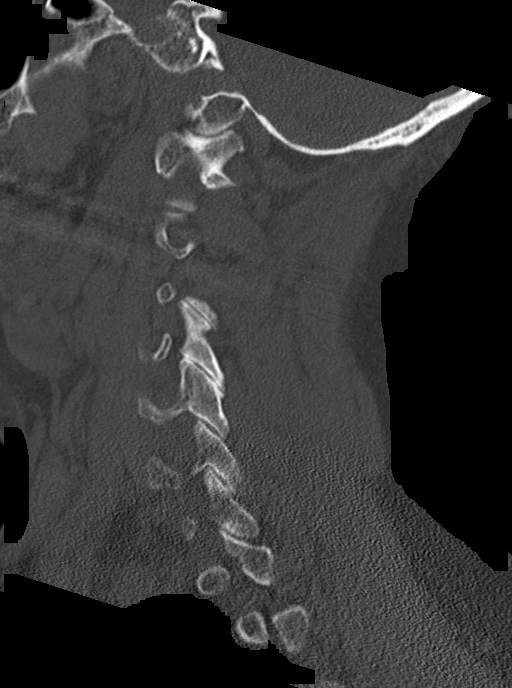

[Series 8: coronal bone · coronal · 0.26mm/px · 3 of 61 slices shown]
[im 13/61  bone]
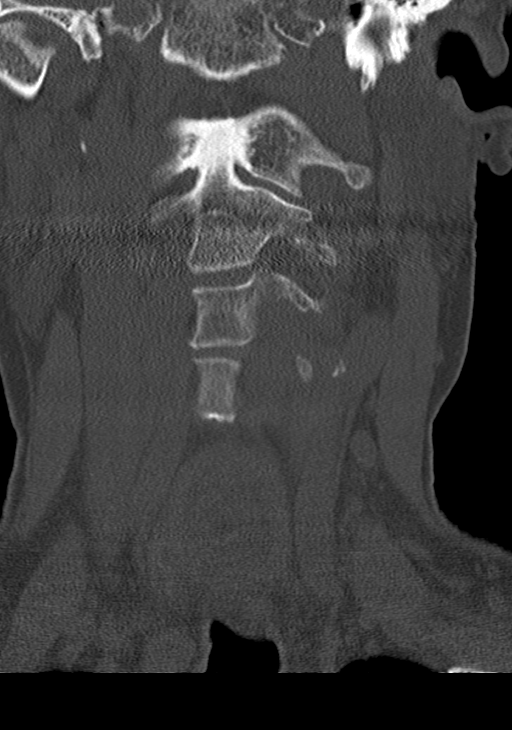
[im 25/61  bone]
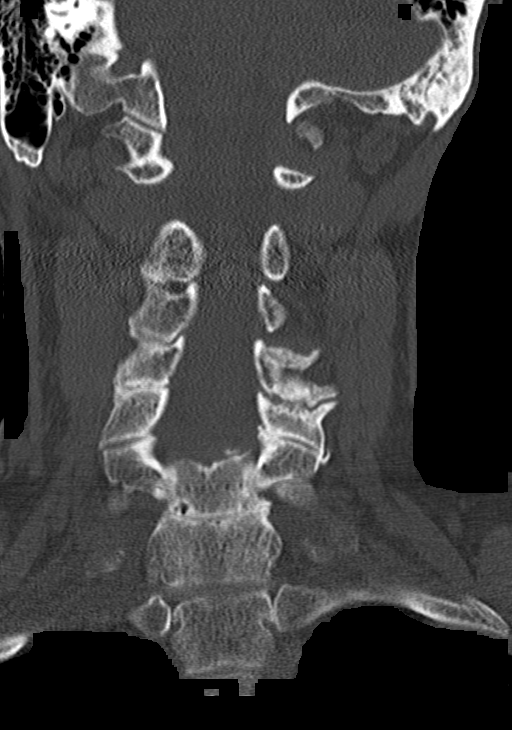
[im 37/61  bone]
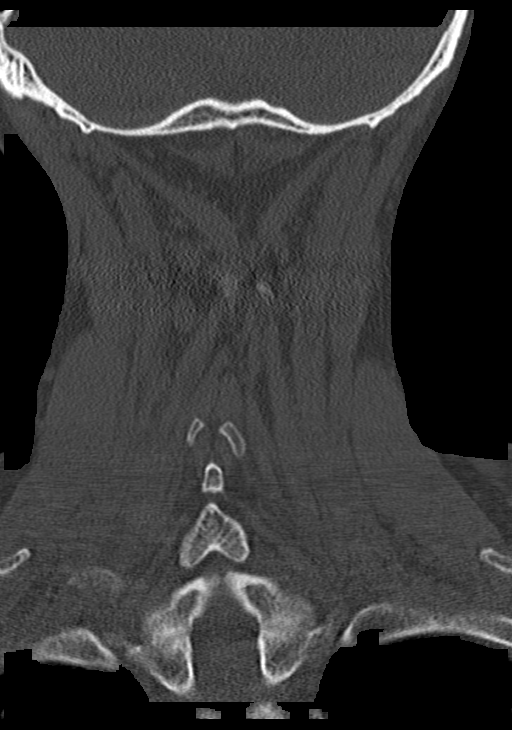

[Series 9: orthogonal bone · axial · 0.23mm/px · z∈[-221,-92]mm · 4 of 101 slices shown, 5 images]
[im 17/101  soft-tissue]
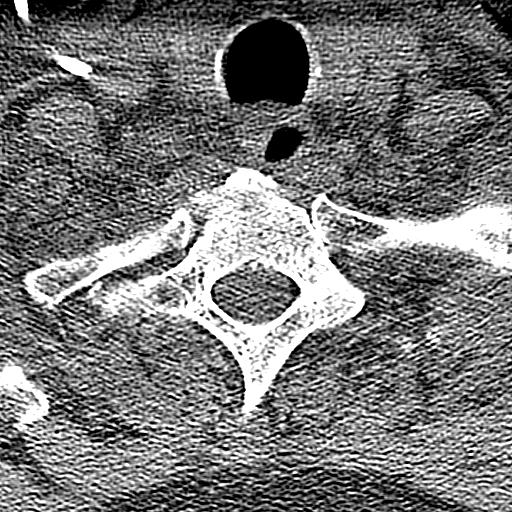
[im 17/101  bone]
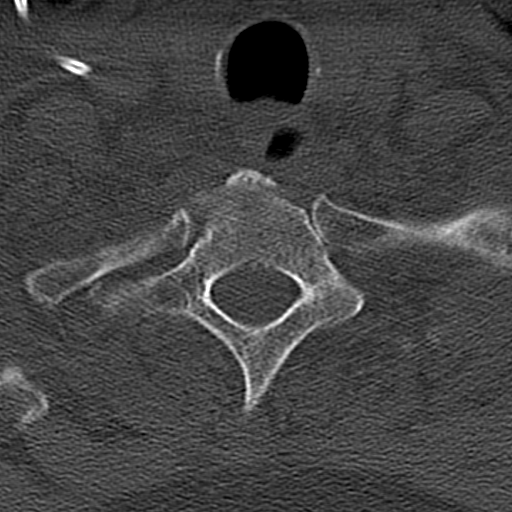
[im 34/101  bone]
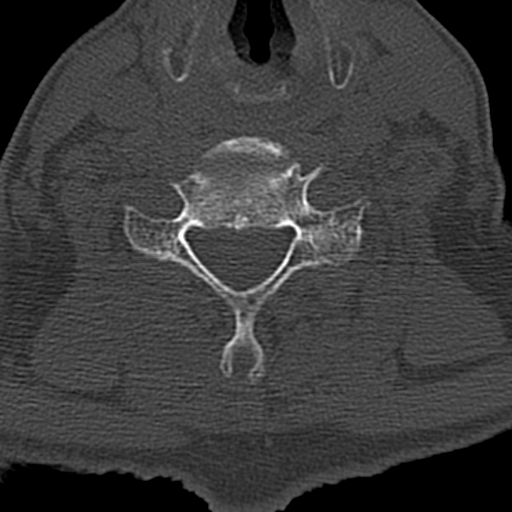
[im 67/101  bone]
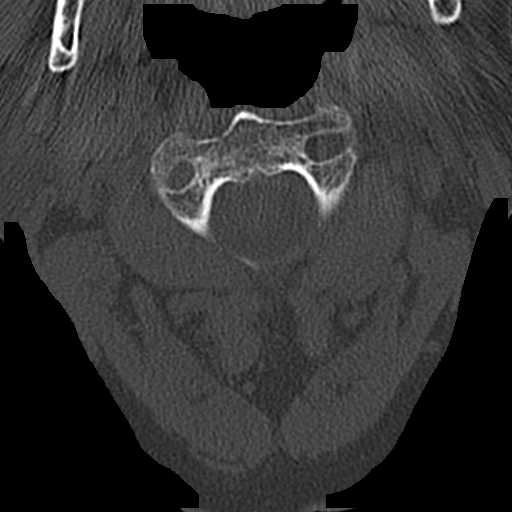
[im 84/101  bone]
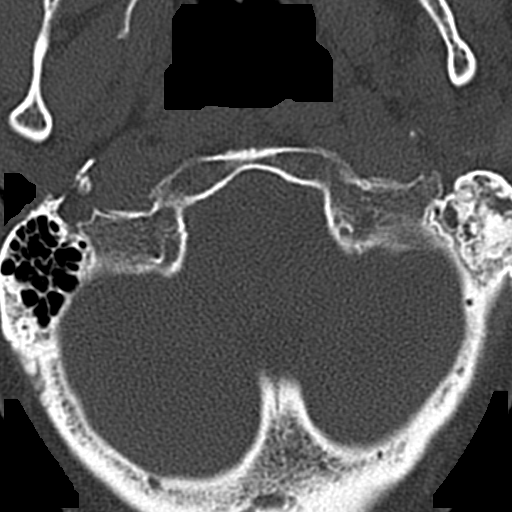

[12 of 33 positions shown; findings below may reference images not displayed]

FINDINGS: CT HEAD FINDINGS

Brain: No evidence of acute infarction, hemorrhage, hydrocephalus,
extra-axial collection or mass lesion/mass effect. Patchy moderate
white matter hypoattenuation, likely the sequela of chronic
microvascular ischemic disease.

Vascular: Calcific atherosclerosis. No hyperdense vessel identified.

Skull: No acute fracture.

Sinuses/Orbits: Mild ethmoid air cell mucosal thickening.
Unremarkable orbits.

Other: Inferior left mastoid effusion. Cerumen in the left external
auditory canal.

CT CERVICAL SPINE FINDINGS

Alignment: Approximately 2-3 mm of anterolisthesis of C4 on C5,
favored degenerative given facet arthropathy at this level. Mild
levocurvature.

Skull base and vertebrae: Vertebral body heights are maintained.

Soft tissues and spinal canal: No prevertebral fluid or swelling. No
visible canal hematoma.

Disc levels: Degenerative disc disease is greatest at C6-C7 where
there is moderate disc height loss and posterior disc osteophyte
complex. Multilevel left greater than right facet arthropathy with
varying degrees of neural foraminal stenosis.

Upper chest: Visualized lung apices are clear.

Other: Retropharyngeal course of the right carotid artery. Calcific
atherosclerosis.
IMPRESSION: CT head:

1. No evidence of acute intracranial abnormality.
2. Moderate chronic microvascular ischemic disease.

CT cervical spine:

1. No evidence of acute fracture or traumatic malalignment.
2. Degenerative disc disease (greatest at C6-C7) and multilevel left
greater than right facet arthropathy with varying degrees of neural
foraminal stenosis.

## 2022-11-25 ENCOUNTER — Other Ambulatory Visit: Payer: Self-pay

## 2022-11-25 DIAGNOSIS — C9000 Multiple myeloma not having achieved remission: Secondary | ICD-10-CM

## 2022-11-25 MED FILL — Dexamethasone Sodium Phosphate Inj 100 MG/10ML: INTRAMUSCULAR | Qty: 1 | Status: AC

## 2022-11-26 ENCOUNTER — Inpatient Hospital Stay: Payer: Medicare Other

## 2022-11-26 ENCOUNTER — Inpatient Hospital Stay: Payer: Medicare Other | Attending: Hematology

## 2022-11-26 VITALS — BP 134/82 | HR 64 | Temp 97.7°F | Resp 18 | Wt 198.5 lb

## 2022-11-26 DIAGNOSIS — Z5112 Encounter for antineoplastic immunotherapy: Secondary | ICD-10-CM | POA: Diagnosis present

## 2022-11-26 DIAGNOSIS — Z95828 Presence of other vascular implants and grafts: Secondary | ICD-10-CM

## 2022-11-26 DIAGNOSIS — C9001 Multiple myeloma in remission: Secondary | ICD-10-CM | POA: Insufficient documentation

## 2022-11-26 DIAGNOSIS — Z7189 Other specified counseling: Secondary | ICD-10-CM

## 2022-11-26 DIAGNOSIS — C9000 Multiple myeloma not having achieved remission: Secondary | ICD-10-CM

## 2022-11-26 LAB — CBC WITH DIFFERENTIAL (CANCER CENTER ONLY)
Abs Immature Granulocytes: 0 10*3/uL (ref 0.00–0.07)
Basophils Absolute: 0 10*3/uL (ref 0.0–0.1)
Basophils Relative: 1 %
Eosinophils Absolute: 0.1 10*3/uL (ref 0.0–0.5)
Eosinophils Relative: 2 %
HCT: 32.9 % — ABNORMAL LOW (ref 39.0–52.0)
Hemoglobin: 11.3 g/dL — ABNORMAL LOW (ref 13.0–17.0)
Immature Granulocytes: 0 %
Lymphocytes Relative: 21 %
Lymphs Abs: 0.8 10*3/uL (ref 0.7–4.0)
MCH: 37.7 pg — ABNORMAL HIGH (ref 26.0–34.0)
MCHC: 34.3 g/dL (ref 30.0–36.0)
MCV: 109.7 fL — ABNORMAL HIGH (ref 80.0–100.0)
Monocytes Absolute: 0.3 10*3/uL (ref 0.1–1.0)
Monocytes Relative: 9 %
Neutro Abs: 2.6 10*3/uL (ref 1.7–7.7)
Neutrophils Relative %: 67 %
Platelet Count: 135 10*3/uL — ABNORMAL LOW (ref 150–400)
RBC: 3 MIL/uL — ABNORMAL LOW (ref 4.22–5.81)
RDW: 13.7 % (ref 11.5–15.5)
WBC Count: 3.8 10*3/uL — ABNORMAL LOW (ref 4.0–10.5)
nRBC: 0 % (ref 0.0–0.2)

## 2022-11-26 LAB — CMP (CANCER CENTER ONLY)
ALT: 21 U/L (ref 0–44)
AST: 27 U/L (ref 15–41)
Albumin: 3.7 g/dL (ref 3.5–5.0)
Alkaline Phosphatase: 56 U/L (ref 38–126)
Anion gap: 6 (ref 5–15)
BUN: 20 mg/dL (ref 8–23)
CO2: 24 mmol/L (ref 22–32)
Calcium: 9.2 mg/dL (ref 8.9–10.3)
Chloride: 107 mmol/L (ref 98–111)
Creatinine: 1.37 mg/dL — ABNORMAL HIGH (ref 0.61–1.24)
GFR, Estimated: 53 mL/min — ABNORMAL LOW (ref >60)
Glucose, Bld: 113 mg/dL — ABNORMAL HIGH (ref 70–99)
Potassium: 3.9 mmol/L (ref 3.5–5.1)
Sodium: 137 mmol/L (ref 135–145)
Total Bilirubin: 0.6 mg/dL (ref 0.3–1.2)
Total Protein: 5.9 g/dL — ABNORMAL LOW (ref 6.5–8.1)

## 2022-11-26 MED ORDER — HEPARIN SOD (PORK) LOCK FLUSH 100 UNIT/ML IV SOLN
500.0000 [IU] | Freq: Once | INTRAVENOUS | Status: AC | PRN
  Administered 2022-11-26: 500 [IU]

## 2022-11-26 MED ORDER — SODIUM CHLORIDE 0.9 % IV SOLN: Freq: Once | INTRAVENOUS | Status: AC

## 2022-11-26 MED ORDER — PROCHLORPERAZINE MALEATE 10 MG PO TABS
10.0000 mg | ORAL_TABLET | Freq: Once | ORAL | Status: AC
  Administered 2022-11-26: 10 mg via ORAL
  Filled 2022-11-26: qty 1

## 2022-11-26 MED ORDER — ACETAMINOPHEN 500 MG PO TABS
1000.0000 mg | ORAL_TABLET | Freq: Once | ORAL | Status: AC
  Administered 2022-11-26: 1000 mg via ORAL
  Filled 2022-11-26: qty 2

## 2022-11-26 MED ORDER — DEXTROSE 5 % IV SOLN
56.0000 mg/m2 | Freq: Once | INTRAVENOUS | Status: AC
  Administered 2022-11-26: 120 mg via INTRAVENOUS
  Filled 2022-11-26: qty 60

## 2022-11-26 MED ORDER — SODIUM CHLORIDE 0.9% FLUSH
10.0000 mL | INTRAVENOUS | Status: DC | PRN
  Administered 2022-11-26: 10 mL

## 2022-11-26 MED ORDER — SODIUM CHLORIDE 0.9% FLUSH
10.0000 mL | Freq: Once | INTRAVENOUS | Status: AC
  Administered 2022-11-26: 10 mL

## 2022-11-26 MED ORDER — SODIUM CHLORIDE 0.9 % IV SOLN
10.0000 mg | Freq: Once | INTRAVENOUS | Status: AC
  Administered 2022-11-26: 10 mg via INTRAVENOUS
  Filled 2022-11-26: qty 10

## 2022-11-26 NOTE — Patient Instructions (Signed)
[place] [facility]  Discharge Instructions: Thank you for choosing [facility] to provide your oncology and hematology care.   If you have a lab appointment with the Cancer Center, please go directly to the Cancer Center and check in at the registration area.   Wear comfortable clothing and clothing appropriate for easy access to any Portacath or PICC line.   We strive to give you quality time with your provider. You may need to reschedule your appointment if you arrive late (15 or more minutes).  Arriving late affects you and other patients whose appointments are after yours.  Also, if you miss three or more appointments without notifying the office, you may be dismissed from the clinic at the provider's discretion.      For prescription refill requests, have your pharmacy contact our office and allow 72 hours for refills to be completed.    Today you received the following chemotherapy and/or immunotherapy agent: Carfilzomib (Kyprolis)   To help prevent nausea and vomiting after your treatment, we encourage you to take your nausea medication as directed.  BELOW ARE SYMPTOMS THAT SHOULD BE REPORTED IMMEDIATELY: *FEVER GREATER THAN 100.4 F (38 C) OR HIGHER *CHILLS OR SWEATING *NAUSEA AND VOMITING THAT IS NOT CONTROLLED WITH YOUR NAUSEA MEDICATION *UNUSUAL SHORTNESS OF BREATH *UNUSUAL BRUISING OR BLEEDING *URINARY PROBLEMS (pain or burning when urinating, or frequent urination) *BOWEL PROBLEMS (unusual diarrhea, constipation, pain near the anus) TENDERNESS IN MOUTH AND THROAT WITH OR WITHOUT PRESENCE OF ULCERS (sore throat, sores in mouth, or a toothache) UNUSUAL RASH, SWELLING OR PAIN  UNUSUAL VAGINAL DISCHARGE OR ITCHING   Items with * indicate a potential emergency and should be followed up as soon as possible or go to the Emergency Department if any problems should occur.  Please show the CHEMOTHERAPY ALERT CARD or IMMUNOTHERAPY ALERT CARD  at check-in to the Emergency Department and triage nurse.  Should you have questions after your visit or need to cancel or reschedule your appointment, please contact [place] [facility]  Dept: [phone]  and follow the prompts.  Office hours are 8:00 a.m. to 4:30 p.m. Monday - Friday. Please note that voicemails left after 4:00 p.m. may not be returned until the following business day.  We are closed weekends and major holidays. You have access to a nurse at all times for urgent questions. Please call the main number to the clinic Dept: [phone] and follow the prompts.   For any non-urgent questions, you may also contact your provider using MyChart. We now offer e-Visits for anyone 78 and older to request care online for non-urgent symptoms. For details visit [email or web address].   Also download the MyChart app! Go to the app store, search "MyChart", open the app, select [place], and log in with your MyChart username and password.

## 2022-12-01 ENCOUNTER — Encounter: Payer: Self-pay | Admitting: Hematology

## 2022-12-01 LAB — MULTIPLE MYELOMA PANEL, SERUM
Albumin SerPl Elph-Mcnc: 3.5 g/dL (ref 2.9–4.4)
Albumin/Glob SerPl: 2 — ABNORMAL HIGH (ref 0.7–1.7)
Alpha 1: 0.2 g/dL (ref 0.0–0.4)
Alpha2 Glob SerPl Elph-Mcnc: 0.6 g/dL (ref 0.4–1.0)
B-Globulin SerPl Elph-Mcnc: 0.8 g/dL (ref 0.7–1.3)
Gamma Glob SerPl Elph-Mcnc: 0.2 g/dL — ABNORMAL LOW (ref 0.4–1.8)
Globulin, Total: 1.8 g/dL — ABNORMAL LOW (ref 2.2–3.9)
IgA: 13 mg/dL — ABNORMAL LOW (ref 61–437)
IgG (Immunoglobin G), Serum: 197 mg/dL — ABNORMAL LOW (ref 603–1613)
IgM (Immunoglobulin M), Srm: <5 mg/dL — ABNORMAL LOW (ref 15–143)
Total Protein ELP: 5.3 g/dL — ABNORMAL LOW (ref 6.0–8.5)

## 2022-12-02 ENCOUNTER — Other Ambulatory Visit: Payer: Self-pay | Admitting: *Deleted

## 2022-12-02 DIAGNOSIS — C9001 Multiple myeloma in remission: Secondary | ICD-10-CM

## 2022-12-02 DIAGNOSIS — C9000 Multiple myeloma not having achieved remission: Secondary | ICD-10-CM

## 2022-12-03 ENCOUNTER — Encounter: Payer: Self-pay | Admitting: Hematology

## 2022-12-03 MED ORDER — FENTANYL 12 MCG/HR TD PT72
1.0000 | MEDICATED_PATCH | TRANSDERMAL | 0 refills | Status: DC
Start: 2022-12-03 — End: 2023-01-06

## 2022-12-03 MED ORDER — TRAMADOL HCL 50 MG PO TABS
50.0000 mg | ORAL_TABLET | Freq: Four times a day (QID) | ORAL | 0 refills | Status: DC | PRN
Start: 2022-12-03 — End: 2022-12-23

## 2022-12-06 ENCOUNTER — Telehealth: Payer: Self-pay | Admitting: Hematology

## 2022-12-09 ENCOUNTER — Other Ambulatory Visit: Payer: Self-pay

## 2022-12-09 DIAGNOSIS — C9000 Multiple myeloma not having achieved remission: Secondary | ICD-10-CM

## 2022-12-09 MED FILL — Dexamethasone Sodium Phosphate Inj 100 MG/10ML: INTRAMUSCULAR | Qty: 1 | Status: AC

## 2022-12-10 ENCOUNTER — Inpatient Hospital Stay: Payer: Medicare Other

## 2022-12-10 ENCOUNTER — Other Ambulatory Visit: Payer: Self-pay

## 2022-12-10 VITALS — BP 138/93 | HR 69 | Temp 98.0°F | Resp 18 | Wt 195.0 lb

## 2022-12-10 DIAGNOSIS — Z95828 Presence of other vascular implants and grafts: Secondary | ICD-10-CM

## 2022-12-10 DIAGNOSIS — Z5112 Encounter for antineoplastic immunotherapy: Secondary | ICD-10-CM | POA: Diagnosis not present

## 2022-12-10 DIAGNOSIS — C9000 Multiple myeloma not having achieved remission: Secondary | ICD-10-CM

## 2022-12-10 DIAGNOSIS — Z7189 Other specified counseling: Secondary | ICD-10-CM

## 2022-12-10 LAB — CBC WITH DIFFERENTIAL (CANCER CENTER ONLY)
Abs Immature Granulocytes: 0.01 10*3/uL (ref 0.00–0.07)
Basophils Absolute: 0 10*3/uL (ref 0.0–0.1)
Basophils Relative: 1 %
Eosinophils Absolute: 0.1 10*3/uL (ref 0.0–0.5)
Eosinophils Relative: 2 %
HCT: 33.4 % — ABNORMAL LOW (ref 39.0–52.0)
Hemoglobin: 11.8 g/dL — ABNORMAL LOW (ref 13.0–17.0)
Immature Granulocytes: 0 %
Lymphocytes Relative: 27 %
Lymphs Abs: 1.1 10*3/uL (ref 0.7–4.0)
MCH: 37.8 pg — ABNORMAL HIGH (ref 26.0–34.0)
MCHC: 35.3 g/dL (ref 30.0–36.0)
MCV: 107.1 fL — ABNORMAL HIGH (ref 80.0–100.0)
Monocytes Absolute: 0.4 10*3/uL (ref 0.1–1.0)
Monocytes Relative: 9 %
Neutro Abs: 2.5 10*3/uL (ref 1.7–7.7)
Neutrophils Relative %: 61 %
Platelet Count: 172 10*3/uL (ref 150–400)
RBC: 3.12 MIL/uL — ABNORMAL LOW (ref 4.22–5.81)
RDW: 13.3 % (ref 11.5–15.5)
WBC Count: 4.3 10*3/uL (ref 4.0–10.5)
nRBC: 0 % (ref 0.0–0.2)

## 2022-12-10 LAB — CMP (CANCER CENTER ONLY)
ALT: 16 U/L (ref 0–44)
AST: 25 U/L (ref 15–41)
Albumin: 4.1 g/dL (ref 3.5–5.0)
Alkaline Phosphatase: 54 U/L (ref 38–126)
Anion gap: 5 (ref 5–15)
BUN: 17 mg/dL (ref 8–23)
CO2: 26 mmol/L (ref 22–32)
Calcium: 9.3 mg/dL (ref 8.9–10.3)
Chloride: 108 mmol/L (ref 98–111)
Creatinine: 1.54 mg/dL — ABNORMAL HIGH (ref 0.61–1.24)
GFR, Estimated: 46 mL/min — ABNORMAL LOW (ref >60)
Glucose, Bld: 111 mg/dL — ABNORMAL HIGH (ref 70–99)
Potassium: 4.3 mmol/L (ref 3.5–5.1)
Sodium: 139 mmol/L (ref 135–145)
Total Bilirubin: 0.7 mg/dL (ref 0.3–1.2)
Total Protein: 5.8 g/dL — ABNORMAL LOW (ref 6.5–8.1)

## 2022-12-10 MED ORDER — ACETAMINOPHEN 500 MG PO TABS
1000.0000 mg | ORAL_TABLET | Freq: Once | ORAL | Status: AC
  Administered 2022-12-10: 1000 mg via ORAL
  Filled 2022-12-10: qty 2

## 2022-12-10 MED ORDER — DEXTROSE 5 % IV SOLN
56.0000 mg/m2 | Freq: Once | INTRAVENOUS | Status: AC
  Administered 2022-12-10: 120 mg via INTRAVENOUS
  Filled 2022-12-10: qty 60

## 2022-12-10 MED ORDER — SODIUM CHLORIDE 0.9% FLUSH
10.0000 mL | INTRAVENOUS | Status: DC | PRN
  Administered 2022-12-10: 10 mL

## 2022-12-10 MED ORDER — SODIUM CHLORIDE 0.9 % IV SOLN: Freq: Once | INTRAVENOUS | Status: AC

## 2022-12-10 MED ORDER — HEPARIN SOD (PORK) LOCK FLUSH 100 UNIT/ML IV SOLN
500.0000 [IU] | Freq: Once | INTRAVENOUS | Status: AC | PRN
  Administered 2022-12-10: 500 [IU]

## 2022-12-10 MED ORDER — SODIUM CHLORIDE 0.9 % IV SOLN
10.0000 mg | Freq: Once | INTRAVENOUS | Status: AC
  Administered 2022-12-10: 10 mg via INTRAVENOUS
  Filled 2022-12-10: qty 10

## 2022-12-10 MED ORDER — SODIUM CHLORIDE 0.9% FLUSH
3.0000 mL | Freq: Once | INTRAVENOUS | Status: AC | PRN
  Administered 2022-12-10: 3 mL

## 2022-12-10 MED ORDER — SODIUM CHLORIDE 0.9% FLUSH: 10.0000 mL | Freq: Once | INTRAVENOUS | Status: DC

## 2022-12-10 MED ORDER — PROCHLORPERAZINE MALEATE 10 MG PO TABS
10.0000 mg | ORAL_TABLET | Freq: Once | ORAL | Status: AC
  Administered 2022-12-10: 10 mg via ORAL
  Filled 2022-12-10: qty 1

## 2022-12-10 NOTE — Progress Notes (Signed)
Pt ok for tx today per Dr Candise Che with CR: 1.54

## 2022-12-10 NOTE — Patient Instructions (Signed)
[place] [facility]  Discharge Instructions: Thank you for choosing [facility] to provide your oncology and hematology care.   If you have a lab appointment with the Cancer Center, please go directly to the Cancer Center and check in at the registration area.   Wear comfortable clothing and clothing appropriate for easy access to any Portacath or PICC line.   We strive to give you quality time with your provider. You may need to reschedule your appointment if you arrive late (15 or more minutes).  Arriving late affects you and other patients whose appointments are after yours.  Also, if you miss three or more appointments without notifying the office, you may be dismissed from the clinic at the provider's discretion.      For prescription refill requests, have your pharmacy contact our office and allow 72 hours for refills to be completed.    Today you received the following chemotherapy and/or immunotherapy agents :  Kyprolis   To help prevent nausea and vomiting after your treatment, we encourage you to take your nausea medication as directed.  BELOW ARE SYMPTOMS THAT SHOULD BE REPORTED IMMEDIATELY: *FEVER GREATER THAN 100.4 F (38 C) OR HIGHER *CHILLS OR SWEATING *NAUSEA AND VOMITING THAT IS NOT CONTROLLED WITH YOUR NAUSEA MEDICATION *UNUSUAL SHORTNESS OF BREATH *UNUSUAL BRUISING OR BLEEDING *URINARY PROBLEMS (pain or burning when urinating, or frequent urination) *BOWEL PROBLEMS (unusual diarrhea, constipation, pain near the anus) TENDERNESS IN MOUTH AND THROAT WITH OR WITHOUT PRESENCE OF ULCERS (sore throat, sores in mouth, or a toothache) UNUSUAL RASH, SWELLING OR PAIN  UNUSUAL VAGINAL DISCHARGE OR ITCHING   Items with * indicate a potential emergency and should be followed up as soon as possible or go to the Emergency Department if any problems should occur.  Please show the CHEMOTHERAPY ALERT CARD or IMMUNOTHERAPY ALERT CARD at  check-in to the Emergency Department and triage nurse.  Should you have questions after your visit or need to cancel or reschedule your appointment, please contact [place] [facility]  Dept: [phone]  and follow the prompts.  Office hours are 8:00 a.m. to 4:30 p.m. Monday - Friday. Please note that voicemails left after 4:00 p.m. may not be returned until the following business day.  We are closed weekends and major holidays. You have access to a nurse at all times for urgent questions. Please call the main number to the clinic Dept: [phone] and follow the prompts.   For any non-urgent questions, you may also contact your provider using MyChart. We now offer e-Visits for anyone 18 and older to request care online for non-urgent symptoms. For details visit [email or web address].   Also download the MyChart app! Go to the app store, search "MyChart", open the app, select [facility], and log in with your MyChart username and password.   

## 2022-12-16 ENCOUNTER — Encounter: Payer: Self-pay | Admitting: Hematology

## 2022-12-17 ENCOUNTER — Other Ambulatory Visit: Payer: Self-pay

## 2022-12-17 DIAGNOSIS — C9000 Multiple myeloma not having achieved remission: Secondary | ICD-10-CM

## 2022-12-17 MED ORDER — TIZANIDINE HCL 2 MG PO CAPS
2.0000 mg | ORAL_CAPSULE | Freq: Three times a day (TID) | ORAL | 0 refills | Status: DC | PRN
Start: 2022-12-17 — End: 2023-02-05

## 2022-12-20 ENCOUNTER — Other Ambulatory Visit: Payer: Self-pay

## 2022-12-20 DIAGNOSIS — C9000 Multiple myeloma not having achieved remission: Secondary | ICD-10-CM

## 2022-12-23 ENCOUNTER — Other Ambulatory Visit: Payer: Self-pay

## 2022-12-23 ENCOUNTER — Encounter: Payer: Self-pay | Admitting: Hematology

## 2022-12-23 DIAGNOSIS — C9000 Multiple myeloma not having achieved remission: Secondary | ICD-10-CM

## 2022-12-23 DIAGNOSIS — C9001 Multiple myeloma in remission: Secondary | ICD-10-CM

## 2022-12-23 MED ORDER — ERGOCALCIFEROL 1.25 MG (50000 UT) PO CAPS
50000.0000 [IU] | ORAL_CAPSULE | ORAL | 3 refills | Status: DC
Start: 2022-12-23 — End: 2023-06-11

## 2022-12-23 MED FILL — Dexamethasone Sodium Phosphate Inj 100 MG/10ML: INTRAMUSCULAR | Qty: 1 | Status: AC

## 2022-12-24 ENCOUNTER — Inpatient Hospital Stay: Payer: Medicare Other

## 2022-12-24 ENCOUNTER — Inpatient Hospital Stay: Payer: Medicare Other | Attending: Hematology | Admitting: Hematology

## 2022-12-24 ENCOUNTER — Other Ambulatory Visit: Payer: Self-pay

## 2022-12-24 VITALS — BP 109/78 | HR 79 | Temp 97.5°F | Resp 20 | Wt 193.4 lb

## 2022-12-24 VITALS — BP 136/87 | HR 75 | Temp 97.8°F | Resp 18

## 2022-12-24 DIAGNOSIS — Z5112 Encounter for antineoplastic immunotherapy: Secondary | ICD-10-CM | POA: Diagnosis present

## 2022-12-24 DIAGNOSIS — Z5111 Encounter for antineoplastic chemotherapy: Secondary | ICD-10-CM | POA: Diagnosis not present

## 2022-12-24 DIAGNOSIS — Z7189 Other specified counseling: Secondary | ICD-10-CM

## 2022-12-24 DIAGNOSIS — C9000 Multiple myeloma not having achieved remission: Secondary | ICD-10-CM | POA: Diagnosis not present

## 2022-12-24 DIAGNOSIS — C9001 Multiple myeloma in remission: Secondary | ICD-10-CM | POA: Diagnosis not present

## 2022-12-24 DIAGNOSIS — Z95828 Presence of other vascular implants and grafts: Secondary | ICD-10-CM

## 2022-12-24 LAB — CMP (CANCER CENTER ONLY)
ALT: 15 U/L (ref 0–44)
AST: 24 U/L (ref 15–41)
Albumin: 4.1 g/dL (ref 3.5–5.0)
Alkaline Phosphatase: 61 U/L (ref 38–126)
Anion gap: 7 (ref 5–15)
BUN: 19 mg/dL (ref 8–23)
CO2: 26 mmol/L (ref 22–32)
Calcium: 9.3 mg/dL (ref 8.9–10.3)
Chloride: 106 mmol/L (ref 98–111)
Creatinine: 1.53 mg/dL — ABNORMAL HIGH (ref 0.61–1.24)
GFR, Estimated: 47 mL/min — ABNORMAL LOW (ref >60)
Glucose, Bld: 123 mg/dL — ABNORMAL HIGH (ref 70–99)
Potassium: 4.1 mmol/L (ref 3.5–5.1)
Sodium: 139 mmol/L (ref 135–145)
Total Bilirubin: 0.7 mg/dL (ref 0.3–1.2)
Total Protein: 5.9 g/dL — ABNORMAL LOW (ref 6.5–8.1)

## 2022-12-24 LAB — CBC WITH DIFFERENTIAL (CANCER CENTER ONLY)
Abs Immature Granulocytes: 0.01 10*3/uL (ref 0.00–0.07)
Basophils Absolute: 0 10*3/uL (ref 0.0–0.1)
Basophils Relative: 1 %
Eosinophils Absolute: 0.1 10*3/uL (ref 0.0–0.5)
Eosinophils Relative: 3 %
HCT: 34.2 % — ABNORMAL LOW (ref 39.0–52.0)
Hemoglobin: 11.6 g/dL — ABNORMAL LOW (ref 13.0–17.0)
Immature Granulocytes: 0 %
Lymphocytes Relative: 30 %
Lymphs Abs: 1.1 10*3/uL (ref 0.7–4.0)
MCH: 36.6 pg — ABNORMAL HIGH (ref 26.0–34.0)
MCHC: 33.9 g/dL (ref 30.0–36.0)
MCV: 107.9 fL — ABNORMAL HIGH (ref 80.0–100.0)
Monocytes Absolute: 0.4 10*3/uL (ref 0.1–1.0)
Monocytes Relative: 10 %
Neutro Abs: 2 10*3/uL (ref 1.7–7.7)
Neutrophils Relative %: 56 %
Platelet Count: 167 10*3/uL (ref 150–400)
RBC: 3.17 MIL/uL — ABNORMAL LOW (ref 4.22–5.81)
RDW: 13.2 % (ref 11.5–15.5)
WBC Count: 3.6 10*3/uL — ABNORMAL LOW (ref 4.0–10.5)
nRBC: 0 % (ref 0.0–0.2)

## 2022-12-24 MED ORDER — SODIUM CHLORIDE 0.9 % IV SOLN: Freq: Once | INTRAVENOUS | Status: AC

## 2022-12-24 MED ORDER — SODIUM CHLORIDE 0.9% FLUSH
10.0000 mL | Freq: Once | INTRAVENOUS | Status: AC
  Administered 2022-12-24: 10 mL

## 2022-12-24 MED ORDER — PROCHLORPERAZINE MALEATE 10 MG PO TABS
10.0000 mg | ORAL_TABLET | Freq: Once | ORAL | Status: AC
  Administered 2022-12-24: 10 mg via ORAL
  Filled 2022-12-24: qty 1

## 2022-12-24 MED ORDER — DEXTROSE 5 % IV SOLN
56.0000 mg/m2 | Freq: Once | INTRAVENOUS | Status: AC
  Administered 2022-12-24: 120 mg via INTRAVENOUS
  Filled 2022-12-24: qty 60

## 2022-12-24 MED ORDER — ACETAMINOPHEN 500 MG PO TABS
1000.0000 mg | ORAL_TABLET | Freq: Once | ORAL | Status: AC
  Administered 2022-12-24: 1000 mg via ORAL
  Filled 2022-12-24: qty 2

## 2022-12-24 MED ORDER — SODIUM CHLORIDE 0.9 % IV SOLN
10.0000 mg | Freq: Once | INTRAVENOUS | Status: AC
  Administered 2022-12-24: 10 mg via INTRAVENOUS
  Filled 2022-12-24: qty 10

## 2022-12-24 MED ORDER — SODIUM CHLORIDE 0.9% FLUSH
10.0000 mL | INTRAVENOUS | Status: DC | PRN
  Administered 2022-12-24: 10 mL

## 2022-12-24 MED ORDER — HEPARIN SOD (PORK) LOCK FLUSH 100 UNIT/ML IV SOLN
500.0000 [IU] | Freq: Once | INTRAVENOUS | Status: AC | PRN
  Administered 2022-12-24: 500 [IU]

## 2022-12-24 NOTE — Progress Notes (Signed)
Patient seen by Dr. Addison Naegeli are within treatment parameters.  Labs reviewed: and are within treatment parameters.  Per physician team, patient is ready for treatment. Please note that modifications are being made to the treatment plan including    Only carfilzomib today no Aredia today. Switching Aredia to every 6 months.

## 2022-12-24 NOTE — Progress Notes (Signed)
Per Dr. Candise Che no Aredia due today will change to every 6 months.
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
## 2022-12-24 NOTE — Patient Instructions (Signed)
[facility] [facility]  Discharge Instructions: Thank you for choosing [facility] to provide your oncology and hematology care.   If you have a lab appointment with the Cancer Center, please go directly to the Cancer Center and check in at the registration area.   Wear comfortable clothing and clothing appropriate for easy access to any Portacath or PICC line.   We strive to give you quality time with your provider. You may need to reschedule your appointment if you arrive late (15 or more minutes).  Arriving late affects you and other patients whose appointments are after yours.  Also, if you miss three or more appointments without notifying the office, you may be dismissed from the clinic at the provider's discretion.      For prescription refill requests, have your pharmacy contact our office and allow 72 hours for refills to be completed.    Today you received the following chemotherapy and/or immunotherapy agent: Carfilzomib (Kyprolis).   To help prevent nausea and vomiting after your treatment, we encourage you to take your nausea medication as directed.  BELOW ARE SYMPTOMS THAT SHOULD BE REPORTED IMMEDIATELY: *FEVER GREATER THAN 100.4 F (38 C) OR HIGHER *CHILLS OR SWEATING *NAUSEA AND VOMITING THAT IS NOT CONTROLLED WITH YOUR NAUSEA MEDICATION *UNUSUAL SHORTNESS OF BREATH *UNUSUAL BRUISING OR BLEEDING *URINARY PROBLEMS (pain or burning when urinating, or frequent urination) *BOWEL PROBLEMS (unusual diarrhea, constipation, pain near the anus) TENDERNESS IN MOUTH AND THROAT WITH OR WITHOUT PRESENCE OF ULCERS (sore throat, sores in mouth, or a toothache) UNUSUAL RASH, SWELLING OR PAIN  UNUSUAL VAGINAL DISCHARGE OR ITCHING   Items with * indicate a potential emergency and should be followed up as soon as possible or go to the Emergency Department if any problems should occur.  Please show the CHEMOTHERAPY ALERT CARD or IMMUNOTHERAPY ALERT  CARD at check-in to the Emergency Department and triage nurse.  Should you have questions after your visit or need to cancel or reschedule your appointment, please contact [place] [facility]  Dept: [phone]  and follow the prompts.  Office hours are 8:00 a.m. to 4:30 p.m. Monday - Friday. Please note that voicemails left after 4:00 p.m. may not be returned until the following business day.  We are closed weekends and major holidays. You have access to a nurse at all times for urgent questions. Please call the main number to the clinic Dept: [phone] and follow the prompts.   For any non-urgent questions, you may also contact your provider using MyChart. We now offer e-Visits for anyone 69 and older to request care online for non-urgent symptoms. For details visit [email or web address].   Also download the MyChart app! Go to the app store, search "MyChart", open the app, select , and log in with your MyChart username and password.  Carfilzomib Injection What is this medication? CARFILZOMIB (kar FILZ oh mib) treats multiple myeloma, a type of bone marrow cancer. It works by blocking a protein that causes cancer cells to grow and multiply. This helps to slow or stop the spread of cancer cells. This medicine may be used for other purposes; ask your health care provider or pharmacist if you have questions. COMMON BRAND NAME(S): KYPROLIS What should I tell my care team before I take this medication? They need to know if you have any of these conditions: Heart disease History of blood clots Irregular heartbeat Kidney disease Liver disease Lung or breathing disease An  unusual or allergic reaction to carfilzomib, or other medications, foods, dyes, or preservatives If you or your partner are pregnant or trying to get pregnant Breastfeeding How should I use this medication? This medication is injected into a vein. It is given by your care team in a  hospital or clinic setting. Talk to your care team about the use of this medication in children. Special care may be needed. Overdosage: If you think you have taken too much of this medicine contact a poison control center or emergency room at once. NOTE: This medicine is only for you. Do not share this medicine with others. What if I miss a dose? Keep appointments for follow-up doses. It is important not to miss your dose. Call your care team if you are unable to keep an appointment. What may interact with this medication? Interactions are not expected. This list may not describe all possible interactions. Give your health care provider a list of all the medicines, herbs, non-prescription drugs, or dietary supplements you use. Also tell them if you smoke, drink alcohol, or use illegal drugs. Some items may interact with your medicine. What should I watch for while using this medication? Your condition will be monitored carefully while you are receiving this medication. You may need blood work while taking this medication. Check with your care team if you have severe diarrhea, nausea, and vomiting, or if you sweat a lot. The loss of too much body fluid may make it dangerous for you to take this medication. This medication may affect your coordination, reaction time, or judgment. Do not drive or operate machinery until you know how this medication affects you. Sit up or stand slowly to reduce the risk of dizzy or fainting spells. Drinking alcohol with this medication can increase the risk of these side effects. Talk to your care team if you may be pregnant. Serious birth defects can occur if you take this medication during pregnancy and for 6 months after the last dose. You will need a negative pregnancy test before starting this medication. Contraception is recommended while taking this medication and for 6 months after the last dose. Your care team can help you find an option that works for you. If  your partner can get pregnant, use a condom during sex while taking this medication and for 3 months after the last dose. Do not breastfeed while taking this medication and for 2 weeks after the last dose. This medication may cause infertility. Talk to your care team if you are concerned about your fertility. What side effects may I notice from receiving this medication? Side effects that you should report to your care team as soon as possible: Allergic reactions--skin rash, itching, hives, swelling of the face, lips, tongue, or throat Bleeding--bloody or black, tar-like stools, vomiting blood or brown material that looks like coffee grounds, red or dark brown urine, small red or purple spots on skin, unusual bruising or bleeding Blood clot--pain, swelling, or warmth in the leg, shortness of breath, chest pain Dizziness, loss of balance or coordination, confusion or trouble speaking Heart attack--pain or tightness in the chest, shoulders, arms, or jaw, nausea, shortness of breath, cold or clammy skin, feeling faint or lightheaded Heart failure--shortness of breath, swelling of the ankles, feet, or hands, sudden weight gain, unusual weakness or fatigue Heart rhythm changes--fast or irregular heartbeat, dizziness, feeling faint or lightheaded, chest pain, trouble breathing Increase in blood pressure Infection--fever, chills, cough, sore throat, wounds that don't heal, pain or trouble when  passing urine, general feeling of discomfort or being unwell Infusion reactions--chest pain, shortness of breath or trouble breathing, feeling faint or lightheaded Kidney injury--decrease in the amount of urine, swelling of the ankles, hands, or feet Liver injury--right upper belly pain, loss of appetite, nausea, light-colored stool, dark yellow or brown urine, yellowing skin or eyes, unusual weakness or fatigue Lung injury--shortness of breath or trouble breathing, cough, spitting up blood, chest pain,  fever Pulmonary hypertension--shortness of breath, chest pain, fast or irregular heartbeat, feeling faint or lightheaded, fatigue, swelling of the ankles or feet Stomach pain, bloody diarrhea, pale skin, unusual weakness or fatigue, decrease in the amount of urine, which may be signs of hemolytic uremic syndrome Sudden and severe headache, confusion, change in vision, seizures, which may be signs of posterior reversible encephalopathy syndrome (PRES) TTP--purple spots on the skin or inside the mouth, pale skin, yellowing skin or eyes, unusual weakness or fatigue, fever, fast or irregular heartbeat, confusion, change in vision, trouble speaking, trouble walking Tumor lysis syndrome (TLS)--nausea, vomiting, diarrhea, decrease in the amount of urine, dark urine, unusual weakness or fatigue, confusion, muscle pain or cramps, fast or irregular heartbeat, joint pain Side effects that usually do not require medical attention (report to your care team if they continue or are bothersome): Diarrhea Fatigue Nausea Trouble sleeping This list may not describe all possible side effects. Call your doctor for medical advice about side effects. You may report side effects to FDA at 1-800-FDA-1088. Where should I keep my medication? This medication is given in a hospital or clinic. It will not be stored at home. NOTE: This sheet is a summary. It may not cover all possible information. If you have questions about this medicine, talk to your doctor, pharmacist, or health care provider.  2024 Elsevier/Gold Standard (2021-12-06 00:00:00)

## 2022-12-24 NOTE — Progress Notes (Signed)
Pt ok for treatment today with CR: 1.53 per Dr Candise Che

## 2022-12-25 ENCOUNTER — Other Ambulatory Visit: Payer: Self-pay

## 2022-12-26 ENCOUNTER — Encounter: Payer: Self-pay | Admitting: Hematology

## 2022-12-26 MED ORDER — TRAMADOL HCL 50 MG PO TABS
50.0000 mg | ORAL_TABLET | Freq: Four times a day (QID) | ORAL | 0 refills | Status: DC | PRN
Start: 2022-12-26 — End: 2023-01-13

## 2022-12-27 ENCOUNTER — Encounter: Payer: Self-pay | Admitting: Hematology

## 2022-12-28 ENCOUNTER — Other Ambulatory Visit: Payer: Self-pay | Admitting: Hematology

## 2022-12-28 DIAGNOSIS — C9000 Multiple myeloma not having achieved remission: Secondary | ICD-10-CM

## 2022-12-30 ENCOUNTER — Other Ambulatory Visit: Payer: Self-pay | Admitting: Hematology

## 2022-12-30 ENCOUNTER — Encounter: Payer: Self-pay | Admitting: Hematology

## 2022-12-30 LAB — MULTIPLE MYELOMA PANEL, SERUM
Albumin SerPl Elph-Mcnc: 3.5 g/dL (ref 2.9–4.4)
Albumin/Glob SerPl: 1.9 — ABNORMAL HIGH (ref 0.7–1.7)
Alpha 1: 0.3 g/dL (ref 0.0–0.4)
Alpha2 Glob SerPl Elph-Mcnc: 0.7 g/dL (ref 0.4–1.0)
B-Globulin SerPl Elph-Mcnc: 0.8 g/dL (ref 0.7–1.3)
Gamma Glob SerPl Elph-Mcnc: 0.1 g/dL — ABNORMAL LOW (ref 0.4–1.8)
Globulin, Total: 1.9 g/dL — ABNORMAL LOW (ref 2.2–3.9)
IgA: 15 mg/dL — ABNORMAL LOW (ref 61–437)
IgG (Immunoglobin G), Serum: 197 mg/dL — ABNORMAL LOW (ref 603–1613)
IgM (Immunoglobulin M), Srm: 5 mg/dL — ABNORMAL LOW (ref 15–143)
Total Protein ELP: 5.4 g/dL — ABNORMAL LOW (ref 6.0–8.5)

## 2023-01-02 ENCOUNTER — Ambulatory Visit (INDEPENDENT_AMBULATORY_CARE_PROVIDER_SITE_OTHER): Payer: Medicare Other | Admitting: Internal Medicine

## 2023-01-02 ENCOUNTER — Telehealth: Payer: Self-pay | Admitting: Internal Medicine

## 2023-01-02 ENCOUNTER — Encounter: Payer: Self-pay | Admitting: Internal Medicine

## 2023-01-02 ENCOUNTER — Ambulatory Visit
Admission: RE | Admit: 2023-01-02 | Discharge: 2023-01-02 | Disposition: A | Discharge status: Home / Self Care | Payer: Medicare Other | Source: Ambulatory Visit | Attending: Internal Medicine | Admitting: Internal Medicine

## 2023-01-02 VITALS — BP 120/82 | HR 83 | Temp 98.6°F | Resp 18

## 2023-01-02 DIAGNOSIS — Z7901 Long term (current) use of anticoagulants: Secondary | ICD-10-CM | POA: Diagnosis not present

## 2023-01-02 DIAGNOSIS — S92214A Nondisplaced fracture of cuboid bone of right foot, initial encounter for closed fracture: Secondary | ICD-10-CM

## 2023-01-02 DIAGNOSIS — N401 Enlarged prostate with lower urinary tract symptoms: Secondary | ICD-10-CM

## 2023-01-02 DIAGNOSIS — Z8579 Personal history of other malignant neoplasms of lymphoid, hematopoietic and related tissues: Secondary | ICD-10-CM

## 2023-01-02 DIAGNOSIS — Z86718 Personal history of other venous thrombosis and embolism: Secondary | ICD-10-CM

## 2023-01-02 DIAGNOSIS — W19XXXA Unspecified fall, initial encounter: Secondary | ICD-10-CM | POA: Diagnosis not present

## 2023-01-02 DIAGNOSIS — R351 Nocturia: Secondary | ICD-10-CM

## 2023-01-02 DIAGNOSIS — Z8659 Personal history of other mental and behavioral disorders: Secondary | ICD-10-CM

## 2023-01-02 DIAGNOSIS — Z9484 Stem cells transplant status: Secondary | ICD-10-CM

## 2023-01-02 NOTE — Telephone Encounter (Signed)
scheduled

## 2023-01-02 NOTE — Progress Notes (Signed)
Ortho referral placed to [facility]

## 2023-01-02 NOTE — Progress Notes (Signed)
Patient Care Team: Margaree Mackintosh, MD as PCP - General (Internal Medicine) Sherrie George, MD as Consulting Physician (Ophthalmology)  Visit Date: 01/02/23  Subjective:    Patient ID: [name] , Male   DOB: 17-Sep-1945, 77 y.o.    MRN: [id number]   77 y.o. Male presents today for swollen right ankle after falling on the pavement on 12/31/22 while walking his new dog. Felt like he twisted his ankle. Sleep is normal. Tetanus update was 02/25/22.  Past Medical History:  Diagnosis Date   Arthritis    Blood transfusion without reported diagnosis 1969   BPH (benign prostatic hyperplasia)    Cataract    x2   Chronic cough    CKD (chronic kidney disease)    Colon polyp    2 adenomas2004, max 7 mm   Depression    Detached retina 2012   Dr. 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  He does not smoke.  Occasional alcohol consumption.  He is married.  This is his second marriage.  No children from second marriage.  Wife has multiple sclerosis.  He has an adult son in good health.       Family history: Father died of lung cancer at age 74 with history of MI.  2 sisters in good health.          Review of Systems  Constitutional:  Negative for fever and malaise/fatigue.  HENT:  Negative for congestion.   Eyes:  Negative for blurred vision.  Respiratory:  Negative for cough and shortness of breath.   Cardiovascular:  Negative for chest pain,  palpitations and leg swelling.  Gastrointestinal:  Negative for vomiting.  Musculoskeletal:  Positive for joint pain (Right ankle). Negative for back pain.  Skin:  Negative for rash.  Neurological:  Negative for loss of consciousness and headaches.        Objective:   Vitals: BP 120/82   Pulse 83   Temp 98.6 F (37 C) (Tympanic)   Resp 18   SpO2 97%    Physical Exam Vitals and nursing note reviewed.  Constitutional:      General: He is not in acute distress.    Appearance: Normal appearance. He is not ill-appearing.  HENT:     Head: Normocephalic and atraumatic.  Pulmonary:     Effort: Pulmonary effort is normal.  Musculoskeletal:     Comments: Swelling lateral ankle.  Feet:     Comments: Purpuric lesions second, third, fourth toes. DP pulse normal right foot. Skin:    General: Skin is warm and dry.     Comments: Superficial lacerations, ecchymoses bilateral arms from dog.  Neurological:     Mental Status:  He is alert and oriented to person, place, and time. Mental status is at baseline.  Psychiatric:        Mood and Affect: Mood normal.        Behavior: Behavior normal.        Thought Content: Thought content normal.        Judgment: Judgment normal.       Results:   Studies obtained and personally reviewed by me:   Labs:       Component Value Date/Time   NA 139 12/24/2022 0942   K 4.1 12/24/2022 0942   CL 106 12/24/2022 0942   CO2 26 12/24/2022 0942   GLUCOSE 123 (H) 12/24/2022 0942   BUN 19 12/24/2022 0942   CREATININE 1.53 (H) 12/24/2022 0942   CREATININE 1.52 (H) 06/28/2022 0953   CALCIUM 9.3 12/24/2022 0942   PROT 5.9 (L) 12/24/2022 0942   ALBUMIN 4.1 12/24/2022 0942   AST 24 12/24/2022 0942   ALT 15 12/24/2022 0942   ALKPHOS 61 12/24/2022 0942   BILITOT 0.7 12/24/2022 0942   GFRNONAA 47 (L) 12/24/2022 0942   GFRNONAA 49 (L) 06/28/2019 1218   GFRAA >60 04/11/2020 1117   GFRAA 56 (L) 06/28/2019 1218     Lab Results  Component Value  Date   WBC 3.6 (L) 12/24/2022   HGB 11.6 (L) 12/24/2022   HCT 34.2 (L) 12/24/2022   MCV 107.9 (H) 12/24/2022   PLT 167 12/24/2022    Lab Results  Component Value Date   CHOL 184 06/28/2022   HDL 75 06/28/2022   LDLCALC 86 06/28/2022   TRIG 131 06/28/2022   CHOLHDL 2.5 06/28/2022    Lab Results  Component Value Date   HGBA1C 5.9 (H) 08/21/2012     Lab Results  Component Value Date   TSH 2.72 06/28/2019     Lab Results  Component Value Date   PSA 0.55 06/28/2022   PSA 0.69 06/22/2021   PSA 0.5 06/28/2019      Assessment & Plan:   Violaceous right lateral foot and ankle: worrisome for fracture. Ordered stat right ankle and foot X-rays. Will call with results. Avoid long walks and elevate it. Apply ice 20 minutes twice daily.    Right Ankle sprain with contusion   Chronic anticoagulation per Oncology/Hx of DVT  BPH treated with Flomax  Hx of depression- stable on meds   STAT Xray shows Closed nondisplaced fracture right cuboid bone. Referral to [facility] per patient request  Hx of multiple myeloma seen by Hematology, Dr. Candise Che  Hx of stem cell transplant  I,Alexander Ruley,acting as a scribe for Margaree Mackintosh, MD.,have documented all relevant documentation on the behalf of Margaree Mackintosh, MD,as directed by  Margaree Mackintosh, MD while in the presence of Margaree Mackintosh, MD.   I, Margaree Mackintosh, MD, have reviewed all documentation for this visit. The documentation on 01/02/23 for the exam, diagnosis, procedures, and orders are all accurate and complete.

## 2023-01-02 NOTE — Telephone Encounter (Signed)
[name] [phone]  Nadine Counts called to say his Right foot is swollen from fall, that he took last Friday tripping over the dog, it is bruised down to his toes and hurts to walk on it. Thinks he might need to be seen or need x-Ray. I let him know he would need to have office visit before an X-ray could be ordered.

## 2023-01-03 ENCOUNTER — Other Ambulatory Visit (HOSPITAL_BASED_OUTPATIENT_CLINIC_OR_DEPARTMENT_OTHER): Payer: Self-pay | Admitting: Orthopaedic Surgery

## 2023-01-03 ENCOUNTER — Ambulatory Visit (HOSPITAL_BASED_OUTPATIENT_CLINIC_OR_DEPARTMENT_OTHER): Payer: Medicare Other | Attending: Orthopaedic Surgery

## 2023-01-03 ENCOUNTER — Encounter (HOSPITAL_BASED_OUTPATIENT_CLINIC_OR_DEPARTMENT_OTHER): Payer: Self-pay

## 2023-01-03 ENCOUNTER — Ambulatory Visit (HOSPITAL_BASED_OUTPATIENT_CLINIC_OR_DEPARTMENT_OTHER): Admission: RE | Admit: 2023-01-03 | Payer: Medicare Other | Source: Ambulatory Visit

## 2023-01-03 DIAGNOSIS — M25571 Pain in right ankle and joints of right foot: Secondary | ICD-10-CM

## 2023-01-05 ENCOUNTER — Encounter: Payer: Self-pay | Admitting: Hematology

## 2023-01-06 ENCOUNTER — Encounter: Payer: Self-pay | Admitting: Hematology

## 2023-01-06 ENCOUNTER — Other Ambulatory Visit: Payer: Self-pay | Admitting: Hematology

## 2023-01-06 ENCOUNTER — Telehealth: Payer: Self-pay | Admitting: Hematology

## 2023-01-06 ENCOUNTER — Other Ambulatory Visit: Payer: Self-pay

## 2023-01-06 ENCOUNTER — Encounter: Payer: Self-pay | Admitting: Orthopaedic Surgery

## 2023-01-06 DIAGNOSIS — C9000 Multiple myeloma not having achieved remission: Secondary | ICD-10-CM

## 2023-01-06 MED ORDER — FENTANYL 12 MCG/HR TD PT72
1.0000 | MEDICATED_PATCH | TRANSDERMAL | 0 refills | Status: DC
Start: 2023-01-06 — End: 2023-02-05

## 2023-01-07 ENCOUNTER — Inpatient Hospital Stay: Payer: Medicare Other

## 2023-01-07 ENCOUNTER — Other Ambulatory Visit: Payer: Self-pay

## 2023-01-07 ENCOUNTER — Ambulatory Visit
Admission: RE | Admit: 2023-01-07 | Discharge: 2023-01-07 | Disposition: A | Discharge status: Home / Self Care | Payer: Medicare Other | Source: Ambulatory Visit | Attending: Orthopaedic Surgery | Admitting: Orthopaedic Surgery

## 2023-01-07 VITALS — BP 115/74 | HR 72 | Temp 98.2°F | Resp 18 | Wt 193.2 lb

## 2023-01-07 DIAGNOSIS — Z95828 Presence of other vascular implants and grafts: Secondary | ICD-10-CM

## 2023-01-07 DIAGNOSIS — M25571 Pain in right ankle and joints of right foot: Secondary | ICD-10-CM

## 2023-01-07 DIAGNOSIS — C9000 Multiple myeloma not having achieved remission: Secondary | ICD-10-CM

## 2023-01-07 DIAGNOSIS — Z7189 Other specified counseling: Secondary | ICD-10-CM

## 2023-01-07 DIAGNOSIS — Z5112 Encounter for antineoplastic immunotherapy: Secondary | ICD-10-CM | POA: Diagnosis not present

## 2023-01-07 LAB — CMP (CANCER CENTER ONLY)
ALT: 13 U/L (ref 0–44)
AST: 20 U/L (ref 15–41)
Albumin: 3.8 g/dL (ref 3.5–5.0)
Alkaline Phosphatase: 61 U/L (ref 38–126)
Anion gap: 8 (ref 5–15)
BUN: 19 mg/dL (ref 8–23)
CO2: 24 mmol/L (ref 22–32)
Calcium: 9.6 mg/dL (ref 8.9–10.3)
Chloride: 106 mmol/L (ref 98–111)
Creatinine: 1.52 mg/dL — ABNORMAL HIGH (ref 0.61–1.24)
GFR, Estimated: 47 mL/min — ABNORMAL LOW (ref >60)
Glucose, Bld: 135 mg/dL — ABNORMAL HIGH (ref 70–99)
Potassium: 4.2 mmol/L (ref 3.5–5.1)
Sodium: 138 mmol/L (ref 135–145)
Total Bilirubin: 0.7 mg/dL (ref 0.3–1.2)
Total Protein: 5.5 g/dL — ABNORMAL LOW (ref 6.5–8.1)

## 2023-01-07 LAB — CBC WITH DIFFERENTIAL (CANCER CENTER ONLY)
Abs Immature Granulocytes: 0.01 10*3/uL (ref 0.00–0.07)
Basophils Absolute: 0 10*3/uL (ref 0.0–0.1)
Basophils Relative: 1 %
Eosinophils Absolute: 0.1 10*3/uL (ref 0.0–0.5)
Eosinophils Relative: 2 %
HCT: 34.5 % — ABNORMAL LOW (ref 39.0–52.0)
Hemoglobin: 11.5 g/dL — ABNORMAL LOW (ref 13.0–17.0)
Immature Granulocytes: 0 %
Lymphocytes Relative: 23 %
Lymphs Abs: 0.9 10*3/uL (ref 0.7–4.0)
MCH: 35.9 pg — ABNORMAL HIGH (ref 26.0–34.0)
MCHC: 33.3 g/dL (ref 30.0–36.0)
MCV: 107.8 fL — ABNORMAL HIGH (ref 80.0–100.0)
Monocytes Absolute: 0.4 10*3/uL (ref 0.1–1.0)
Monocytes Relative: 10 %
Neutro Abs: 2.6 10*3/uL (ref 1.7–7.7)
Neutrophils Relative %: 64 %
Platelet Count: 191 10*3/uL (ref 150–400)
RBC: 3.2 MIL/uL — ABNORMAL LOW (ref 4.22–5.81)
RDW: 13.2 % (ref 11.5–15.5)
WBC Count: 4.1 10*3/uL (ref 4.0–10.5)
nRBC: 0 % (ref 0.0–0.2)

## 2023-01-07 MED ORDER — SODIUM CHLORIDE 0.9 % IV SOLN: Freq: Once | INTRAVENOUS | Status: AC

## 2023-01-07 MED ORDER — SODIUM CHLORIDE 0.9% FLUSH
10.0000 mL | Freq: Once | INTRAVENOUS | Status: AC
  Administered 2023-01-07: 10 mL

## 2023-01-07 MED ORDER — ACETAMINOPHEN 500 MG PO TABS
1000.0000 mg | ORAL_TABLET | Freq: Once | ORAL | Status: AC
  Administered 2023-01-07: 1000 mg via ORAL
  Filled 2023-01-07: qty 2

## 2023-01-07 MED ORDER — SODIUM CHLORIDE 0.9 % IV SOLN
10.0000 mg | Freq: Once | INTRAVENOUS | Status: AC
  Administered 2023-01-07: 10 mg via INTRAVENOUS
  Filled 2023-01-07: qty 10

## 2023-01-07 MED ORDER — HEPARIN SOD (PORK) LOCK FLUSH 100 UNIT/ML IV SOLN
500.0000 [IU] | Freq: Once | INTRAVENOUS | Status: AC | PRN
  Administered 2023-01-07: 500 [IU]

## 2023-01-07 MED ORDER — PROCHLORPERAZINE MALEATE 10 MG PO TABS
10.0000 mg | ORAL_TABLET | Freq: Once | ORAL | Status: AC
  Administered 2023-01-07: 10 mg via ORAL
  Filled 2023-01-07: qty 1

## 2023-01-07 MED ORDER — SODIUM CHLORIDE 0.9% FLUSH
10.0000 mL | INTRAVENOUS | Status: DC | PRN
  Administered 2023-01-07: 10 mL

## 2023-01-07 MED ORDER — DEXTROSE 5 % IV SOLN
56.0000 mg/m2 | Freq: Once | INTRAVENOUS | Status: AC
  Administered 2023-01-07: 120 mg via INTRAVENOUS
  Filled 2023-01-07: qty 60

## 2023-01-07 NOTE — Patient Instructions (Signed)
[place] [facility]  Discharge Instructions: Thank you for choosing [facility] to provide your oncology and hematology care.   If you have a lab appointment with the Cancer Center, please go directly to the Cancer Center and check in at the registration area.   Wear comfortable clothing and clothing appropriate for easy access to any Portacath or PICC line.   We strive to give you quality time with your provider. You may need to reschedule your appointment if you arrive late (15 or more minutes).  Arriving late affects you and other patients whose appointments are after yours.  Also, if you miss three or more appointments without notifying the office, you may be dismissed from the clinic at the provider's discretion.      For prescription refill requests, have your pharmacy contact our office and allow 72 hours for refills to be completed.    Today you received the following chemotherapy and/or immunotherapy agents :  Kyprolis   To help prevent nausea and vomiting after your treatment, we encourage you to take your nausea medication as directed.  BELOW ARE SYMPTOMS THAT SHOULD BE REPORTED IMMEDIATELY: *FEVER GREATER THAN 100.4 F (38 C) OR HIGHER *CHILLS OR SWEATING *NAUSEA AND VOMITING THAT IS NOT CONTROLLED WITH YOUR NAUSEA MEDICATION *UNUSUAL SHORTNESS OF BREATH *UNUSUAL BRUISING OR BLEEDING *URINARY PROBLEMS (pain or burning when urinating, or frequent urination) *BOWEL PROBLEMS (unusual diarrhea, constipation, pain near the anus) TENDERNESS IN MOUTH AND THROAT WITH OR WITHOUT PRESENCE OF ULCERS (sore throat, sores in mouth, or a toothache) UNUSUAL RASH, SWELLING OR PAIN  UNUSUAL VAGINAL DISCHARGE OR ITCHING   Items with * indicate a potential emergency and should be followed up as soon as possible or go to the Emergency Department if any problems should occur.  Please show the CHEMOTHERAPY ALERT CARD or IMMUNOTHERAPY ALERT CARD at  check-in to the Emergency Department and triage nurse.  Should you have questions after your visit or need to cancel or reschedule your appointment, please contact [place] [facility]  Dept: [phone]  and follow the prompts.  Office hours are 8:00 a.m. to 4:30 p.m. Monday - Friday. Please note that voicemails left after 4:00 p.m. may not be returned until the following business day.  We are closed weekends and major holidays. You have access to a nurse at all times for urgent questions. Please call the main number to the clinic Dept: [phone] and follow the prompts.   For any non-urgent questions, you may also contact your provider using MyChart. We now offer e-Visits for anyone 18 and older to request care online for non-urgent symptoms. For details visit [email or web address].   Also download the MyChart app! Go to the app store, search "MyChart", open the app, select [place], and log in with your MyChart username and password.   

## 2023-01-07 NOTE — Progress Notes (Signed)
Pt reported to infusion today with c/o a broken bone in his right foot. Pt informed this RN that he experienced a fall on 12/27/2022 when he tripped over his dog. This RN made Dr. Candise Che and [facility] RN aware. Per Dr. Candise Che OK to proceed with tx with SCR 1.52 and with undisplaced cuboid fracture.

## 2023-01-09 ENCOUNTER — Other Ambulatory Visit: Payer: Self-pay

## 2023-01-11 ENCOUNTER — Encounter: Payer: Self-pay | Admitting: Hematology

## 2023-01-13 ENCOUNTER — Other Ambulatory Visit: Payer: Self-pay

## 2023-01-13 DIAGNOSIS — C9001 Multiple myeloma in remission: Secondary | ICD-10-CM

## 2023-01-13 MED ORDER — TRAMADOL HCL 50 MG PO TABS
50.0000 mg | ORAL_TABLET | Freq: Four times a day (QID) | ORAL | 0 refills | Status: DC | PRN
Start: 2023-01-13 — End: 2023-02-05

## 2023-01-20 MED FILL — Dexamethasone Sodium Phosphate Inj 100 MG/10ML: INTRAMUSCULAR | Qty: 1 | Status: AC

## 2023-01-21 ENCOUNTER — Inpatient Hospital Stay (HOSPITAL_BASED_OUTPATIENT_CLINIC_OR_DEPARTMENT_OTHER): Payer: Medicare Other | Admitting: Hematology

## 2023-01-21 ENCOUNTER — Encounter: Payer: Self-pay | Admitting: Hematology

## 2023-01-21 ENCOUNTER — Other Ambulatory Visit: Payer: Self-pay

## 2023-01-21 ENCOUNTER — Inpatient Hospital Stay: Payer: Medicare Other | Attending: Hematology

## 2023-01-21 ENCOUNTER — Inpatient Hospital Stay: Payer: Medicare Other

## 2023-01-21 VITALS — BP 143/78 | HR 74 | Temp 99.8°F | Resp 20 | Wt 192.4 lb

## 2023-01-21 DIAGNOSIS — C9001 Multiple myeloma in remission: Secondary | ICD-10-CM

## 2023-01-21 DIAGNOSIS — Z95828 Presence of other vascular implants and grafts: Secondary | ICD-10-CM

## 2023-01-21 DIAGNOSIS — Z5111 Encounter for antineoplastic chemotherapy: Secondary | ICD-10-CM | POA: Diagnosis not present

## 2023-01-21 DIAGNOSIS — C9000 Multiple myeloma not having achieved remission: Secondary | ICD-10-CM

## 2023-01-21 DIAGNOSIS — Z5112 Encounter for antineoplastic immunotherapy: Secondary | ICD-10-CM | POA: Insufficient documentation

## 2023-01-21 DIAGNOSIS — Z7189 Other specified counseling: Secondary | ICD-10-CM

## 2023-01-21 LAB — CBC WITH DIFFERENTIAL (CANCER CENTER ONLY)
Abs Immature Granulocytes: 0.01 10*3/uL (ref 0.00–0.07)
Basophils Absolute: 0 10*3/uL (ref 0.0–0.1)
Basophils Relative: 1 %
Eosinophils Absolute: 0.1 10*3/uL (ref 0.0–0.5)
Eosinophils Relative: 1 %
HCT: 35.2 % — ABNORMAL LOW (ref 39.0–52.0)
Hemoglobin: 11.7 g/dL — ABNORMAL LOW (ref 13.0–17.0)
Immature Granulocytes: 0 %
Lymphocytes Relative: 21 %
Lymphs Abs: 1.1 10*3/uL (ref 0.7–4.0)
MCH: 36.2 pg — ABNORMAL HIGH (ref 26.0–34.0)
MCHC: 33.2 g/dL (ref 30.0–36.0)
MCV: 109 fL — ABNORMAL HIGH (ref 80.0–100.0)
Monocytes Absolute: 0.4 10*3/uL (ref 0.1–1.0)
Monocytes Relative: 7 %
Neutro Abs: 3.6 10*3/uL (ref 1.7–7.7)
Neutrophils Relative %: 70 %
Platelet Count: 169 10*3/uL (ref 150–400)
RBC: 3.23 MIL/uL — ABNORMAL LOW (ref 4.22–5.81)
RDW: 13.1 % (ref 11.5–15.5)
WBC Count: 5.2 10*3/uL (ref 4.0–10.5)
nRBC: 0 % (ref 0.0–0.2)

## 2023-01-21 LAB — CMP (CANCER CENTER ONLY)
ALT: 14 U/L (ref 0–44)
AST: 19 U/L (ref 15–41)
Albumin: 3.8 g/dL (ref 3.5–5.0)
Alkaline Phosphatase: 73 U/L (ref 38–126)
Anion gap: 6 (ref 5–15)
BUN: 19 mg/dL (ref 8–23)
CO2: 27 mmol/L (ref 22–32)
Calcium: 9.8 mg/dL (ref 8.9–10.3)
Chloride: 106 mmol/L (ref 98–111)
Creatinine: 1.37 mg/dL — ABNORMAL HIGH (ref 0.61–1.24)
GFR, Estimated: 53 mL/min — ABNORMAL LOW (ref >60)
Glucose, Bld: 106 mg/dL — ABNORMAL HIGH (ref 70–99)
Potassium: 4.2 mmol/L (ref 3.5–5.1)
Sodium: 139 mmol/L (ref 135–145)
Total Bilirubin: 0.5 mg/dL (ref 0.3–1.2)
Total Protein: 5.4 g/dL — ABNORMAL LOW (ref 6.5–8.1)

## 2023-01-21 MED ORDER — SODIUM CHLORIDE 0.9 % IV SOLN
10.0000 mg | Freq: Once | INTRAVENOUS | Status: AC
  Administered 2023-01-21: 10 mg via INTRAVENOUS
  Filled 2023-01-21: qty 10

## 2023-01-21 MED ORDER — SODIUM CHLORIDE 0.9 % IV SOLN: Freq: Once | INTRAVENOUS | Status: DC

## 2023-01-21 MED ORDER — SODIUM CHLORIDE 0.9 % IV SOLN: Freq: Once | INTRAVENOUS | Status: AC

## 2023-01-21 MED ORDER — PROCHLORPERAZINE MALEATE 10 MG PO TABS
10.0000 mg | ORAL_TABLET | Freq: Once | ORAL | Status: AC
  Administered 2023-01-21: 10 mg via ORAL
  Filled 2023-01-21: qty 1

## 2023-01-21 MED ORDER — DEXTROSE 5 % IV SOLN
56.0000 mg/m2 | Freq: Once | INTRAVENOUS | Status: AC
  Administered 2023-01-21: 120 mg via INTRAVENOUS
  Filled 2023-01-21: qty 60

## 2023-01-21 MED ORDER — SODIUM CHLORIDE 0.9% FLUSH
10.0000 mL | Freq: Once | INTRAVENOUS | Status: AC
  Administered 2023-01-21: 10 mL

## 2023-01-21 MED ORDER — ACETAMINOPHEN 500 MG PO TABS
1000.0000 mg | ORAL_TABLET | Freq: Once | ORAL | Status: AC
  Administered 2023-01-21: 1000 mg via ORAL
  Filled 2023-01-21: qty 2

## 2023-01-21 MED ORDER — HEPARIN SOD (PORK) LOCK FLUSH 100 UNIT/ML IV SOLN
500.0000 [IU] | Freq: Once | INTRAVENOUS | Status: AC | PRN
  Administered 2023-01-21: 500 [IU]

## 2023-01-21 MED ORDER — SODIUM CHLORIDE 0.9% FLUSH
10.0000 mL | INTRAVENOUS | Status: DC | PRN
  Administered 2023-01-21: 10 mL
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
## 2023-01-21 NOTE — Patient Instructions (Addendum)
[facility]  Discharge Instructions: Thank you for choosing [facility] to provide your oncology and hematology care.   If you have a lab appointment with the Cancer Center, please go directly to the Cancer Center and check in at the registration area.   Wear comfortable clothing and clothing appropriate for easy access to any Portacath or PICC line.   We strive to give you quality time with your provider. You may need to reschedule your appointment if you arrive late (15 or more minutes).  Arriving late affects you and other patients whose appointments are after yours.  Also, if you miss three or more appointments without notifying the office, you may be dismissed from the clinic at the provider's discretion.      For prescription refill requests, have your pharmacy contact our office and allow 72 hours for refills to be completed.    Today you received the following chemotherapy and/or immunotherapy agent: Carfilzomib (Kyprolis).   To help prevent nausea and vomiting after your treatment, we encourage you to take your nausea medication as directed.  BELOW ARE SYMPTOMS THAT SHOULD BE REPORTED IMMEDIATELY: *FEVER GREATER THAN 100.4 F (38 C) OR HIGHER *CHILLS OR SWEATING *NAUSEA AND VOMITING THAT IS NOT CONTROLLED WITH YOUR NAUSEA MEDICATION *UNUSUAL SHORTNESS OF BREATH *UNUSUAL BRUISING OR BLEEDING *URINARY PROBLEMS (pain or burning when urinating, or frequent urination) *BOWEL PROBLEMS (unusual diarrhea, constipation, pain near the anus) TENDERNESS IN MOUTH AND THROAT WITH OR WITHOUT PRESENCE OF ULCERS (sore throat, sores in mouth, or a toothache) UNUSUAL RASH, SWELLING OR PAIN  UNUSUAL VAGINAL DISCHARGE OR ITCHING   Items with * indicate a potential emergency and should be followed up as soon as possible or go to the Emergency Department if any problems should occur.  Please show the CHEMOTHERAPY ALERT CARD or IMMUNOTHERAPY ALERT  CARD at check-in to the Emergency Department and triage nurse.  Should you have questions after your visit or need to cancel or reschedule your appointment, please contact [facility]  Dept: [phone]  and follow the prompts.  Office hours are 8:00 a.m. to 4:30 p.m. Monday - Friday. Please note that voicemails left after 4:00 p.m. may not be returned until the following business day.  We are closed weekends and major holidays. You have access to a nurse at all times for urgent questions. Please call the main number to the clinic Dept: [phone] and follow the prompts.   For any non-urgent questions, you may also contact your provider using MyChart. We now offer e-Visits for anyone 18 and older to request care online for non-urgent symptoms. For details visit [email or web address].   Also download the MyChart app! Go to the app store, search "MyChart", open the app, select [place], and log in with your MyChart username and password.  Carfilzomib Injection What is this medication? CARFILZOMIB (kar FILZ oh mib) treats multiple myeloma, a type of bone marrow cancer. It works by blocking a protein that causes cancer cells to grow and multiply. This helps to slow or stop the spread of cancer cells. This medicine may be used for other purposes; ask your health care provider or pharmacist if you have questions. COMMON BRAND NAME(S): KYPROLIS What should I tell my care team before I take this medication? They need to know if you have any of these conditions: Heart disease History of blood clots Irregular heartbeat Kidney disease Liver disease Lung or breathing disease An   unusual or allergic reaction to carfilzomib, or other medications, foods, dyes, or preservatives If you or your partner are pregnant or trying to get pregnant Breastfeeding How should I use this medication? This medication is injected into a vein. It is given by your care team in a  hospital or clinic setting. Talk to your care team about the use of this medication in children. Special care may be needed. Overdosage: If you think you have taken too much of this medicine contact a poison control center or emergency room at once. NOTE: This medicine is only for you. Do not share this medicine with others. What if I miss a dose? Keep appointments for follow-up doses. It is important not to miss your dose. Call your care team if you are unable to keep an appointment. What may interact with this medication? Interactions are not expected. This list may not describe all possible interactions. Give your health care provider a list of all the medicines, herbs, non-prescription drugs, or dietary supplements you use. Also tell them if you smoke, drink alcohol, or use illegal drugs. Some items may interact with your medicine. What should I watch for while using this medication? Your condition will be monitored carefully while you are receiving this medication. You may need blood work while taking this medication. Check with your care team if you have severe diarrhea, nausea, and vomiting, or if you sweat a lot. The loss of too much body fluid may make it dangerous for you to take this medication. This medication may affect your coordination, reaction time, or judgment. Do not drive or operate machinery until you know how this medication affects you. Sit up or stand slowly to reduce the risk of dizzy or fainting spells. Drinking alcohol with this medication can increase the risk of these side effects. Talk to your care team if you may be pregnant. Serious birth defects can occur if you take this medication during pregnancy and for 6 months after the last dose. You will need a negative pregnancy test before starting this medication. Contraception is recommended while taking this medication and for 6 months after the last dose. Your care team can help you find an option that works for you. If  your partner can get pregnant, use a condom during sex while taking this medication and for 3 months after the last dose. Do not breastfeed while taking this medication and for 2 weeks after the last dose. This medication may cause infertility. Talk to your care team if you are concerned about your fertility. What side effects may I notice from receiving this medication? Side effects that you should report to your care team as soon as possible: Allergic reactions--skin rash, itching, hives, swelling of the face, lips, tongue, or throat Bleeding--bloody or black, tar-like stools, vomiting blood or brown material that looks like coffee grounds, red or dark brown urine, small red or purple spots on skin, unusual bruising or bleeding Blood clot--pain, swelling, or warmth in the leg, shortness of breath, chest pain Dizziness, loss of balance or coordination, confusion or trouble speaking Heart attack--pain or tightness in the chest, shoulders, arms, or jaw, nausea, shortness of breath, cold or clammy skin, feeling faint or lightheaded Heart failure--shortness of breath, swelling of the ankles, feet, or hands, sudden weight gain, unusual weakness or fatigue Heart rhythm changes--fast or irregular heartbeat, dizziness, feeling faint or lightheaded, chest pain, trouble breathing Increase in blood pressure Infection--fever, chills, cough, sore throat, wounds that don't heal, pain or trouble when   passing urine, general feeling of discomfort or being unwell Infusion reactions--chest pain, shortness of breath or trouble breathing, feeling faint or lightheaded Kidney injury--decrease in the amount of urine, swelling of the ankles, hands, or feet Liver injury--right upper belly pain, loss of appetite, nausea, light-colored stool, dark yellow or brown urine, yellowing skin or eyes, unusual weakness or fatigue Lung injury--shortness of breath or trouble breathing, cough, spitting up blood, chest pain,  fever Pulmonary hypertension--shortness of breath, chest pain, fast or irregular heartbeat, feeling faint or lightheaded, fatigue, swelling of the ankles or feet Stomach pain, bloody diarrhea, pale skin, unusual weakness or fatigue, decrease in the amount of urine, which may be signs of hemolytic uremic syndrome Sudden and severe headache, confusion, change in vision, seizures, which may be signs of posterior reversible encephalopathy syndrome (PRES) TTP--purple spots on the skin or inside the mouth, pale skin, yellowing skin or eyes, unusual weakness or fatigue, fever, fast or irregular heartbeat, confusion, change in vision, trouble speaking, trouble walking Tumor lysis syndrome (TLS)--nausea, vomiting, diarrhea, decrease in the amount of urine, dark urine, unusual weakness or fatigue, confusion, muscle pain or cramps, fast or irregular heartbeat, joint pain Side effects that usually do not require medical attention (report to your care team if they continue or are bothersome): Diarrhea Fatigue Nausea Trouble sleeping This list may not describe all possible side effects. Call your doctor for medical advice about side effects. You may report side effects to FDA at 1-800-FDA-1088. Where should I keep my medication? This medication is given in a hospital or clinic. It will not be stored at home. NOTE: This sheet is a summary. It may not cover all possible information. If you have questions about this medicine, talk to your doctor, pharmacist, or health care provider.  2024 Elsevier/Gold Standard (2021-12-06 00:00:00)   

## 2023-01-21 NOTE — Progress Notes (Signed)
Patient seen by Dr. Kale  Vitals are within treatment parameters.  Labs reviewed: and are within treatment parameters.  Per physician team, patient is ready for treatment and there are NO modifications to the treatment plan.  

## 2023-01-23 ENCOUNTER — Other Ambulatory Visit: Payer: Self-pay

## 2023-01-27 ENCOUNTER — Encounter: Payer: Self-pay | Admitting: Hematology

## 2023-01-27 LAB — MULTIPLE MYELOMA PANEL, SERUM
Albumin SerPl Elph-Mcnc: 3.6 g/dL (ref 2.9–4.4)
Albumin/Glob SerPl: 1.9 — ABNORMAL HIGH (ref 0.7–1.7)
Alpha 1: 0.2 g/dL (ref 0.0–0.4)
Alpha2 Glob SerPl Elph-Mcnc: 0.7 g/dL (ref 0.4–1.0)
B-Globulin SerPl Elph-Mcnc: 0.8 g/dL (ref 0.7–1.3)
Gamma Glob SerPl Elph-Mcnc: 0.2 g/dL — ABNORMAL LOW (ref 0.4–1.8)
Globulin, Total: 1.9 g/dL — ABNORMAL LOW (ref 2.2–3.9)
IgA: 17 mg/dL — ABNORMAL LOW (ref 61–437)
IgG (Immunoglobin G), Serum: 196 mg/dL — ABNORMAL LOW (ref 603–1613)
IgM (Immunoglobulin M), Srm: <5 mg/dL — ABNORMAL LOW (ref 15–143)
Total Protein ELP: 5.5 g/dL — ABNORMAL LOW (ref 6.0–8.5)

## 2023-01-31 ENCOUNTER — Other Ambulatory Visit: Payer: Self-pay

## 2023-01-31 DIAGNOSIS — C9000 Multiple myeloma not having achieved remission: Secondary | ICD-10-CM

## 2023-02-03 MED FILL — Dexamethasone Sodium Phosphate Inj 100 MG/10ML: INTRAMUSCULAR | Qty: 1 | Status: AC

## 2023-02-04 ENCOUNTER — Inpatient Hospital Stay: Payer: Medicare Other

## 2023-02-04 ENCOUNTER — Ambulatory Visit: Payer: Medicare Other

## 2023-02-04 ENCOUNTER — Other Ambulatory Visit: Payer: Medicare Other

## 2023-02-04 ENCOUNTER — Other Ambulatory Visit: Payer: Self-pay

## 2023-02-04 VITALS — BP 132/84 | HR 66 | Temp 97.9°F | Resp 17 | Wt 191.0 lb

## 2023-02-04 DIAGNOSIS — Z5112 Encounter for antineoplastic immunotherapy: Secondary | ICD-10-CM | POA: Diagnosis not present

## 2023-02-04 DIAGNOSIS — C9000 Multiple myeloma not having achieved remission: Secondary | ICD-10-CM

## 2023-02-04 DIAGNOSIS — Z95828 Presence of other vascular implants and grafts: Secondary | ICD-10-CM

## 2023-02-04 DIAGNOSIS — Z7189 Other specified counseling: Secondary | ICD-10-CM

## 2023-02-04 LAB — CMP (CANCER CENTER ONLY)
ALT: 16 U/L (ref 0–44)
AST: 22 U/L (ref 15–41)
Albumin: 4 g/dL (ref 3.5–5.0)
Alkaline Phosphatase: 70 U/L (ref 38–126)
Anion gap: 6 (ref 5–15)
BUN: 20 mg/dL (ref 8–23)
CO2: 26 mmol/L (ref 22–32)
Calcium: 9.6 mg/dL (ref 8.9–10.3)
Chloride: 108 mmol/L (ref 98–111)
Creatinine: 1.5 mg/dL — ABNORMAL HIGH (ref 0.61–1.24)
GFR, Estimated: 48 mL/min — ABNORMAL LOW (ref >60)
Glucose, Bld: 133 mg/dL — ABNORMAL HIGH (ref 70–99)
Potassium: 4.2 mmol/L (ref 3.5–5.1)
Sodium: 140 mmol/L (ref 135–145)
Total Bilirubin: 0.6 mg/dL (ref 0.3–1.2)
Total Protein: 5.9 g/dL — ABNORMAL LOW (ref 6.5–8.1)

## 2023-02-04 LAB — CBC WITH DIFFERENTIAL (CANCER CENTER ONLY)
Abs Immature Granulocytes: 0.01 10*3/uL (ref 0.00–0.07)
Basophils Absolute: 0 10*3/uL (ref 0.0–0.1)
Basophils Relative: 1 %
Eosinophils Absolute: 0.1 10*3/uL (ref 0.0–0.5)
Eosinophils Relative: 2 %
HCT: 34.1 % — ABNORMAL LOW (ref 39.0–52.0)
Hemoglobin: 11.7 g/dL — ABNORMAL LOW (ref 13.0–17.0)
Immature Granulocytes: 0 %
Lymphocytes Relative: 22 %
Lymphs Abs: 1 10*3/uL (ref 0.7–4.0)
MCH: 36.9 pg — ABNORMAL HIGH (ref 26.0–34.0)
MCHC: 34.3 g/dL (ref 30.0–36.0)
MCV: 107.6 fL — ABNORMAL HIGH (ref 80.0–100.0)
Monocytes Absolute: 0.3 10*3/uL (ref 0.1–1.0)
Monocytes Relative: 8 %
Neutro Abs: 3 10*3/uL (ref 1.7–7.7)
Neutrophils Relative %: 67 %
Platelet Count: 158 10*3/uL (ref 150–400)
RBC: 3.17 MIL/uL — ABNORMAL LOW (ref 4.22–5.81)
RDW: 13.3 % (ref 11.5–15.5)
WBC Count: 4.5 10*3/uL (ref 4.0–10.5)
nRBC: 0 % (ref 0.0–0.2)

## 2023-02-04 MED ORDER — SODIUM CHLORIDE 0.9% FLUSH
10.0000 mL | INTRAVENOUS | Status: DC | PRN
  Administered 2023-02-04: 10 mL

## 2023-02-04 MED ORDER — SODIUM CHLORIDE 0.9% FLUSH
10.0000 mL | Freq: Once | INTRAVENOUS | Status: AC
  Administered 2023-02-04: 10 mL

## 2023-02-04 MED ORDER — SODIUM CHLORIDE 0.9 % IV SOLN: Freq: Once | INTRAVENOUS | Status: DC

## 2023-02-04 MED ORDER — SODIUM CHLORIDE 0.9 % IV SOLN: Freq: Once | INTRAVENOUS | Status: AC

## 2023-02-04 MED ORDER — SODIUM CHLORIDE 0.9 % IV SOLN
10.0000 mg | Freq: Once | INTRAVENOUS | Status: AC
  Administered 2023-02-04: 10 mg via INTRAVENOUS
  Filled 2023-02-04: qty 10

## 2023-02-04 MED ORDER — HEPARIN SOD (PORK) LOCK FLUSH 100 UNIT/ML IV SOLN
500.0000 [IU] | Freq: Once | INTRAVENOUS | Status: AC | PRN
  Administered 2023-02-04: 500 [IU]

## 2023-02-04 MED ORDER — PROCHLORPERAZINE MALEATE 10 MG PO TABS
10.0000 mg | ORAL_TABLET | Freq: Once | ORAL | Status: AC
  Administered 2023-02-04: 10 mg via ORAL
  Filled 2023-02-04: qty 1

## 2023-02-04 MED ORDER — ACETAMINOPHEN 500 MG PO TABS
1000.0000 mg | ORAL_TABLET | Freq: Once | ORAL | Status: AC
  Administered 2023-02-04: 1000 mg via ORAL
  Filled 2023-02-04: qty 2

## 2023-02-04 MED ORDER — DEXTROSE 5 % IV SOLN
56.0000 mg/m2 | Freq: Once | INTRAVENOUS | Status: AC
  Administered 2023-02-04: 120 mg via INTRAVENOUS
  Filled 2023-02-04: qty 60

## 2023-02-04 NOTE — Patient Instructions (Signed)
[facility]  Discharge Instructions: Thank you for choosing [facility] to provide your oncology and hematology care.   If you have a lab appointment with the Cancer Center, please go directly to the Cancer Center and check in at the registration area.   Wear comfortable clothing and clothing appropriate for easy access to any Portacath or PICC line.   We strive to give you quality time with your provider. You may need to reschedule your appointment if you arrive late (15 or more minutes).  Arriving late affects you and other patients whose appointments are after yours.  Also, if you miss three or more appointments without notifying the office, you may be dismissed from the clinic at the provider's discretion.      For prescription refill requests, have your pharmacy contact our office and allow 72 hours for refills to be completed.    Today you received the following chemotherapy and/or immunotherapy agents :  Kyprolis   To help prevent nausea and vomiting after your treatment, we encourage you to take your nausea medication as directed.  BELOW ARE SYMPTOMS THAT SHOULD BE REPORTED IMMEDIATELY: *FEVER GREATER THAN 100.4 F (38 C) OR HIGHER *CHILLS OR SWEATING *NAUSEA AND VOMITING THAT IS NOT CONTROLLED WITH YOUR NAUSEA MEDICATION *UNUSUAL SHORTNESS OF BREATH *UNUSUAL BRUISING OR BLEEDING *URINARY PROBLEMS (pain or burning when urinating, or frequent urination) *BOWEL PROBLEMS (unusual diarrhea, constipation, pain near the anus) TENDERNESS IN MOUTH AND THROAT WITH OR WITHOUT PRESENCE OF ULCERS (sore throat, sores in mouth, or a toothache) UNUSUAL RASH, SWELLING OR PAIN  UNUSUAL VAGINAL DISCHARGE OR ITCHING   Items with * indicate a potential emergency and should be followed up as soon as possible or go to the Emergency Department if any problems should occur.  Please show the CHEMOTHERAPY ALERT CARD or IMMUNOTHERAPY ALERT CARD at  check-in to the Emergency Department and triage nurse.  Should you have questions after your visit or need to cancel or reschedule your appointment, please contact [facility]  Dept: [phone]  and follow the prompts.  Office hours are 8:00 a.m. to 4:30 p.m. Monday - Friday. Please note that voicemails left after 4:00 p.m. may not be returned until the following business day.  We are closed weekends and major holidays. You have access to a nurse at all times for urgent questions. Please call the main number to the clinic Dept: [phone] and follow the prompts.   For any non-urgent questions, you may also contact your provider using MyChart. We now offer e-Visits for anyone 18 and older to request care online for non-urgent symptoms. For details visit [email or web address].   Also download the MyChart app! Go to the app store, search "MyChart", open the app, select [place], and log in with your MyChart username and password.   

## 2023-02-05 ENCOUNTER — Other Ambulatory Visit: Payer: Self-pay

## 2023-02-05 ENCOUNTER — Encounter: Payer: Self-pay | Admitting: Hematology

## 2023-02-05 DIAGNOSIS — C9000 Multiple myeloma not having achieved remission: Secondary | ICD-10-CM

## 2023-02-05 DIAGNOSIS — C9001 Multiple myeloma in remission: Secondary | ICD-10-CM

## 2023-02-06 ENCOUNTER — Encounter: Payer: Self-pay | Admitting: Hematology

## 2023-02-06 ENCOUNTER — Other Ambulatory Visit: Payer: Self-pay

## 2023-02-06 DIAGNOSIS — C9000 Multiple myeloma not having achieved remission: Secondary | ICD-10-CM

## 2023-02-06 MED ORDER — TIZANIDINE HCL 2 MG PO CAPS: 2.0000 mg | ORAL_CAPSULE | Freq: Three times a day (TID) | ORAL | 0 refills | Status: DC | PRN

## 2023-02-06 MED ORDER — FENTANYL 12 MCG/HR TD PT72
1.0000 | MEDICATED_PATCH | TRANSDERMAL | 0 refills | Status: DC
Start: 2023-02-06 — End: 2023-03-04

## 2023-02-06 MED ORDER — ONDANSETRON HCL 8 MG PO TABS
8.0000 mg | ORAL_TABLET | Freq: Three times a day (TID) | ORAL | 0 refills | Status: DC | PRN
Start: 2023-02-06 — End: 2023-06-23

## 2023-02-06 MED ORDER — TRAMADOL HCL 50 MG PO TABS
50.0000 mg | ORAL_TABLET | Freq: Four times a day (QID) | ORAL | 0 refills | Status: DC | PRN
Start: 2023-02-06 — End: 2023-03-03

## 2023-02-17 ENCOUNTER — Other Ambulatory Visit: Payer: Self-pay

## 2023-02-17 DIAGNOSIS — C9000 Multiple myeloma not having achieved remission: Secondary | ICD-10-CM

## 2023-02-17 MED FILL — Dexamethasone Sodium Phosphate Inj 100 MG/10ML: INTRAMUSCULAR | Qty: 1 | Status: AC

## 2023-02-18 ENCOUNTER — Other Ambulatory Visit: Payer: Self-pay

## 2023-02-18 ENCOUNTER — Ambulatory Visit: Payer: Medicare Other | Admitting: Physician Assistant

## 2023-02-18 ENCOUNTER — Inpatient Hospital Stay: Payer: Medicare Other

## 2023-02-18 VITALS — BP 136/87 | HR 71 | Temp 98.3°F | Resp 18 | Wt 190.8 lb

## 2023-02-18 DIAGNOSIS — C9000 Multiple myeloma not having achieved remission: Secondary | ICD-10-CM

## 2023-02-18 DIAGNOSIS — Z7189 Other specified counseling: Secondary | ICD-10-CM

## 2023-02-18 DIAGNOSIS — Z95828 Presence of other vascular implants and grafts: Secondary | ICD-10-CM

## 2023-02-18 DIAGNOSIS — Z5112 Encounter for antineoplastic immunotherapy: Secondary | ICD-10-CM | POA: Diagnosis not present

## 2023-02-18 LAB — CMP (CANCER CENTER ONLY)
ALT: 17 U/L (ref 0–44)
AST: 24 U/L (ref 15–41)
Albumin: 4 g/dL (ref 3.5–5.0)
Alkaline Phosphatase: 64 U/L (ref 38–126)
Anion gap: 7 (ref 5–15)
BUN: 16 mg/dL (ref 8–23)
CO2: 26 mmol/L (ref 22–32)
Calcium: 9.7 mg/dL (ref 8.9–10.3)
Chloride: 107 mmol/L (ref 98–111)
Creatinine: 1.5 mg/dL — ABNORMAL HIGH (ref 0.61–1.24)
GFR, Estimated: 48 mL/min — ABNORMAL LOW (ref >60)
Glucose, Bld: 116 mg/dL — ABNORMAL HIGH (ref 70–99)
Potassium: 4.2 mmol/L (ref 3.5–5.1)
Sodium: 140 mmol/L (ref 135–145)
Total Bilirubin: 0.5 mg/dL (ref 0.3–1.2)
Total Protein: 5.8 g/dL — ABNORMAL LOW (ref 6.5–8.1)

## 2023-02-18 LAB — CBC WITH DIFFERENTIAL (CANCER CENTER ONLY)
Abs Immature Granulocytes: 0.01 10*3/uL (ref 0.00–0.07)
Basophils Absolute: 0 10*3/uL (ref 0.0–0.1)
Basophils Relative: 1 %
Eosinophils Absolute: 0.1 10*3/uL (ref 0.0–0.5)
Eosinophils Relative: 3 %
HCT: 34.9 % — ABNORMAL LOW (ref 39.0–52.0)
Hemoglobin: 11.7 g/dL — ABNORMAL LOW (ref 13.0–17.0)
Immature Granulocytes: 0 %
Lymphocytes Relative: 34 %
Lymphs Abs: 1.3 10*3/uL (ref 0.7–4.0)
MCH: 36.1 pg — ABNORMAL HIGH (ref 26.0–34.0)
MCHC: 33.5 g/dL (ref 30.0–36.0)
MCV: 107.7 fL — ABNORMAL HIGH (ref 80.0–100.0)
Monocytes Absolute: 0.4 10*3/uL (ref 0.1–1.0)
Monocytes Relative: 10 %
Neutro Abs: 2.1 10*3/uL (ref 1.7–7.7)
Neutrophils Relative %: 52 %
Platelet Count: 164 10*3/uL (ref 150–400)
RBC: 3.24 MIL/uL — ABNORMAL LOW (ref 4.22–5.81)
RDW: 13.4 % (ref 11.5–15.5)
WBC Count: 3.9 10*3/uL — ABNORMAL LOW (ref 4.0–10.5)
nRBC: 0 % (ref 0.0–0.2)

## 2023-02-18 MED ORDER — SODIUM CHLORIDE 0.9% FLUSH
10.0000 mL | Freq: Once | INTRAVENOUS | Status: AC
  Administered 2023-02-18: 10 mL

## 2023-02-18 MED ORDER — SODIUM CHLORIDE 0.9 % IV SOLN
10.0000 mg | Freq: Once | INTRAVENOUS | Status: AC
  Administered 2023-02-18: 10 mg via INTRAVENOUS
  Filled 2023-02-18: qty 10

## 2023-02-18 MED ORDER — PROCHLORPERAZINE MALEATE 10 MG PO TABS
10.0000 mg | ORAL_TABLET | Freq: Once | ORAL | Status: AC
  Administered 2023-02-18: 10 mg via ORAL
  Filled 2023-02-18: qty 1

## 2023-02-18 MED ORDER — SODIUM CHLORIDE 0.9 % IV SOLN: Freq: Once | INTRAVENOUS | Status: AC

## 2023-02-18 MED ORDER — HEPARIN SOD (PORK) LOCK FLUSH 100 UNIT/ML IV SOLN
500.0000 [IU] | Freq: Once | INTRAVENOUS | Status: AC | PRN
  Administered 2023-02-18: 500 [IU]

## 2023-02-18 MED ORDER — SODIUM CHLORIDE 0.9% FLUSH
10.0000 mL | INTRAVENOUS | Status: DC | PRN
  Administered 2023-02-18: 10 mL

## 2023-02-18 MED ORDER — DEXTROSE 5 % IV SOLN
56.0000 mg/m2 | Freq: Once | INTRAVENOUS | Status: AC
  Administered 2023-02-18: 120 mg via INTRAVENOUS
  Filled 2023-02-18: qty 60

## 2023-02-18 MED ORDER — ACETAMINOPHEN 500 MG PO TABS
1000.0000 mg | ORAL_TABLET | Freq: Once | ORAL | Status: AC
  Administered 2023-02-18: 1000 mg via ORAL
  Filled 2023-02-18: qty 2

## 2023-02-18 NOTE — Patient Instructions (Signed)
[facility]  Discharge Instructions: Thank you for choosing [facility] to provide your oncology and hematology care.   If you have a lab appointment with the Cancer Center, please go directly to the Cancer Center and check in at the registration area.   Wear comfortable clothing and clothing appropriate for easy access to any Portacath or PICC line.   We strive to give you quality time with your provider. You may need to reschedule your appointment if you arrive late (15 or more minutes).  Arriving late affects you and other patients whose appointments are after yours.  Also, if you miss three or more appointments without notifying the office, you may be dismissed from the clinic at the provider's discretion.      For prescription refill requests, have your pharmacy contact our office and allow 72 hours for refills to be completed.    Today you received the following chemotherapy and/or immunotherapy agents :  Kyprolis   To help prevent nausea and vomiting after your treatment, we encourage you to take your nausea medication as directed.  BELOW ARE SYMPTOMS THAT SHOULD BE REPORTED IMMEDIATELY: *FEVER GREATER THAN 100.4 F (38 C) OR HIGHER *CHILLS OR SWEATING *NAUSEA AND VOMITING THAT IS NOT CONTROLLED WITH YOUR NAUSEA MEDICATION *UNUSUAL SHORTNESS OF BREATH *UNUSUAL BRUISING OR BLEEDING *URINARY PROBLEMS (pain or burning when urinating, or frequent urination) *BOWEL PROBLEMS (unusual diarrhea, constipation, pain near the anus) TENDERNESS IN MOUTH AND THROAT WITH OR WITHOUT PRESENCE OF ULCERS (sore throat, sores in mouth, or a toothache) UNUSUAL RASH, SWELLING OR PAIN  UNUSUAL VAGINAL DISCHARGE OR ITCHING   Items with * indicate a potential emergency and should be followed up as soon as possible or go to the Emergency Department if any problems should occur.  Please show the CHEMOTHERAPY ALERT CARD or IMMUNOTHERAPY ALERT CARD at  check-in to the Emergency Department and triage nurse.  Should you have questions after your visit or need to cancel or reschedule your appointment, please contact [facility]  Dept: [phone]  and follow the prompts.  Office hours are 8:00 a.m. to 4:30 p.m. Monday - Friday. Please note that voicemails left after 4:00 p.m. may not be returned until the following business day.  We are closed weekends and major holidays. You have access to a nurse at all times for urgent questions. Please call the main number to the clinic Dept: [phone] and follow the prompts.   For any non-urgent questions, you may also contact your provider using MyChart. We now offer e-Visits for anyone 18 and older to request care online for non-urgent symptoms. For details visit [email or web address].   Also download the MyChart app! Go to the app store, search "MyChart", open the app, select [place], and log in with your MyChart username and password.   

## 2023-03-02 ENCOUNTER — Encounter: Payer: Self-pay | Admitting: Hematology

## 2023-03-03 ENCOUNTER — Other Ambulatory Visit: Payer: Self-pay

## 2023-03-03 DIAGNOSIS — C9001 Multiple myeloma in remission: Secondary | ICD-10-CM

## 2023-03-03 DIAGNOSIS — C9000 Multiple myeloma not having achieved remission: Secondary | ICD-10-CM

## 2023-03-03 MED ORDER — TRAMADOL HCL 50 MG PO TABS
50.0000 mg | ORAL_TABLET | Freq: Four times a day (QID) | ORAL | 0 refills | Status: DC | PRN
Start: 2023-03-03 — End: 2023-04-04

## 2023-03-03 MED FILL — Dexamethasone Sodium Phosphate Inj 100 MG/10ML: INTRAMUSCULAR | Qty: 1 | Status: AC

## 2023-03-04 ENCOUNTER — Inpatient Hospital Stay: Payer: Medicare Other | Attending: Hematology

## 2023-03-04 ENCOUNTER — Inpatient Hospital Stay: Payer: Medicare Other

## 2023-03-04 ENCOUNTER — Other Ambulatory Visit: Payer: Self-pay

## 2023-03-04 ENCOUNTER — Encounter: Payer: Self-pay | Admitting: Hematology

## 2023-03-04 VITALS — BP 126/83 | HR 80 | Temp 98.5°F | Resp 18 | Wt 187.1 lb

## 2023-03-04 DIAGNOSIS — C9000 Multiple myeloma not having achieved remission: Secondary | ICD-10-CM

## 2023-03-04 DIAGNOSIS — Z5112 Encounter for antineoplastic immunotherapy: Secondary | ICD-10-CM | POA: Insufficient documentation

## 2023-03-04 DIAGNOSIS — C9001 Multiple myeloma in remission: Secondary | ICD-10-CM | POA: Diagnosis not present

## 2023-03-04 DIAGNOSIS — Z7189 Other specified counseling: Secondary | ICD-10-CM

## 2023-03-04 LAB — CMP (CANCER CENTER ONLY)
ALT: 14 U/L (ref 0–44)
AST: 21 U/L (ref 15–41)
Albumin: 4 g/dL (ref 3.5–5.0)
Alkaline Phosphatase: 63 U/L (ref 38–126)
Anion gap: 7 (ref 5–15)
BUN: 22 mg/dL (ref 8–23)
CO2: 27 mmol/L (ref 22–32)
Calcium: 9.6 mg/dL (ref 8.9–10.3)
Chloride: 106 mmol/L (ref 98–111)
Creatinine: 1.64 mg/dL — ABNORMAL HIGH (ref 0.61–1.24)
GFR, Estimated: 43 mL/min — ABNORMAL LOW (ref >60)
Glucose, Bld: 144 mg/dL — ABNORMAL HIGH (ref 70–99)
Potassium: 3.9 mmol/L (ref 3.5–5.1)
Sodium: 140 mmol/L (ref 135–145)
Total Bilirubin: 0.7 mg/dL (ref 0.3–1.2)
Total Protein: 5.9 g/dL — ABNORMAL LOW (ref 6.5–8.1)

## 2023-03-04 LAB — CBC WITH DIFFERENTIAL (CANCER CENTER ONLY)
Abs Immature Granulocytes: 0.01 10*3/uL (ref 0.00–0.07)
Basophils Absolute: 0 10*3/uL (ref 0.0–0.1)
Basophils Relative: 1 %
Eosinophils Absolute: 0.1 10*3/uL (ref 0.0–0.5)
Eosinophils Relative: 2 %
HCT: 35 % — ABNORMAL LOW (ref 39.0–52.0)
Hemoglobin: 12.1 g/dL — ABNORMAL LOW (ref 13.0–17.0)
Immature Granulocytes: 0 %
Lymphocytes Relative: 29 %
Lymphs Abs: 1.2 10*3/uL (ref 0.7–4.0)
MCH: 36.6 pg — ABNORMAL HIGH (ref 26.0–34.0)
MCHC: 34.6 g/dL (ref 30.0–36.0)
MCV: 105.7 fL — ABNORMAL HIGH (ref 80.0–100.0)
Monocytes Absolute: 0.4 10*3/uL (ref 0.1–1.0)
Monocytes Relative: 9 %
Neutro Abs: 2.4 10*3/uL (ref 1.7–7.7)
Neutrophils Relative %: 59 %
Platelet Count: 170 10*3/uL (ref 150–400)
RBC: 3.31 MIL/uL — ABNORMAL LOW (ref 4.22–5.81)
RDW: 13.3 % (ref 11.5–15.5)
WBC Count: 4.1 10*3/uL (ref 4.0–10.5)
nRBC: 0 % (ref 0.0–0.2)

## 2023-03-04 MED ORDER — SODIUM CHLORIDE 0.9 % IV SOLN: Freq: Once | INTRAVENOUS | Status: AC

## 2023-03-04 MED ORDER — SODIUM CHLORIDE 0.9 % IV SOLN
10.0000 mg | Freq: Once | INTRAVENOUS | Status: AC
  Administered 2023-03-04: 10 mg via INTRAVENOUS
  Filled 2023-03-04: qty 10

## 2023-03-04 MED ORDER — PROCHLORPERAZINE MALEATE 10 MG PO TABS
10.0000 mg | ORAL_TABLET | Freq: Once | ORAL | Status: AC
  Administered 2023-03-04: 10 mg via ORAL
  Filled 2023-03-04: qty 1

## 2023-03-04 MED ORDER — ACETAMINOPHEN 500 MG PO TABS
1000.0000 mg | ORAL_TABLET | Freq: Once | ORAL | Status: AC
  Administered 2023-03-04: 1000 mg via ORAL
  Filled 2023-03-04: qty 2

## 2023-03-04 MED ORDER — HEPARIN SOD (PORK) LOCK FLUSH 100 UNIT/ML IV SOLN
500.0000 [IU] | Freq: Once | INTRAVENOUS | Status: AC | PRN
  Administered 2023-03-04: 500 [IU]

## 2023-03-04 MED ORDER — DEXTROSE 5 % IV SOLN
56.0000 mg/m2 | Freq: Once | INTRAVENOUS | Status: AC
  Administered 2023-03-04: 120 mg via INTRAVENOUS
  Filled 2023-03-04: qty 60

## 2023-03-04 MED ORDER — SODIUM CHLORIDE 0.9% FLUSH
10.0000 mL | INTRAVENOUS | Status: DC | PRN
  Administered 2023-03-04: 10 mL

## 2023-03-04 NOTE — Progress Notes (Signed)
Per Candise Che MD, ok to treat today with SCR 1.64

## 2023-03-04 NOTE — Patient Instructions (Signed)
[facility]  Discharge Instructions: Thank you for choosing [facility] to provide your oncology and hematology care.   If you have a lab appointment with the Cancer Center, please go directly to the Cancer Center and check in at the registration area.   Wear comfortable clothing and clothing appropriate for easy access to any Portacath or PICC line.   We strive to give you quality time with your provider. You may need to reschedule your appointment if you arrive late (15 or more minutes).  Arriving late affects you and other patients whose appointments are after yours.  Also, if you miss three or more appointments without notifying the office, you may be dismissed from the clinic at the provider's discretion.      For prescription refill requests, have your pharmacy contact our office and allow 72 hours for refills to be completed.    Today you received the following chemotherapy and/or immunotherapy agents :  Kyprolis   To help prevent nausea and vomiting after your treatment, we encourage you to take your nausea medication as directed.  BELOW ARE SYMPTOMS THAT SHOULD BE REPORTED IMMEDIATELY: *FEVER GREATER THAN 100.4 F (38 C) OR HIGHER *CHILLS OR SWEATING *NAUSEA AND VOMITING THAT IS NOT CONTROLLED WITH YOUR NAUSEA MEDICATION *UNUSUAL SHORTNESS OF BREATH *UNUSUAL BRUISING OR BLEEDING *URINARY PROBLEMS (pain or burning when urinating, or frequent urination) *BOWEL PROBLEMS (unusual diarrhea, constipation, pain near the anus) TENDERNESS IN MOUTH AND THROAT WITH OR WITHOUT PRESENCE OF ULCERS (sore throat, sores in mouth, or a toothache) UNUSUAL RASH, SWELLING OR PAIN  UNUSUAL VAGINAL DISCHARGE OR ITCHING   Items with * indicate a potential emergency and should be followed up as soon as possible or go to the Emergency Department if any problems should occur.  Please show the CHEMOTHERAPY ALERT CARD or IMMUNOTHERAPY ALERT CARD at  check-in to the Emergency Department and triage nurse.  Should you have questions after your visit or need to cancel or reschedule your appointment, please contact [facility]  Dept: [phone]  and follow the prompts.  Office hours are 8:00 a.m. to 4:30 p.m. Monday - Friday. Please note that voicemails left after 4:00 p.m. may not be returned until the following business day.  We are closed weekends and major holidays. You have access to a nurse at all times for urgent questions. Please call the main number to the clinic Dept: [phone] and follow the prompts.   For any non-urgent questions, you may also contact your provider using MyChart. We now offer e-Visits for anyone 18 and older to request care online for non-urgent symptoms. For details visit [email or web address].   Also download the MyChart app! Go to the app store, search "MyChart", open the app, select [place], and log in with your MyChart username and password.   

## 2023-03-06 ENCOUNTER — Encounter: Payer: Self-pay | Admitting: Hematology

## 2023-03-06 MED ORDER — FENTANYL 12 MCG/HR TD PT72
1.0000 | MEDICATED_PATCH | TRANSDERMAL | 0 refills | Status: DC
Start: 2023-03-06 — End: 2023-04-04

## 2023-03-07 ENCOUNTER — Other Ambulatory Visit: Payer: Self-pay | Admitting: Hematology

## 2023-03-07 DIAGNOSIS — C9000 Multiple myeloma not having achieved remission: Secondary | ICD-10-CM

## 2023-03-17 MED FILL — Dexamethasone Sodium Phosphate Inj 100 MG/10ML: INTRAMUSCULAR | Qty: 1 | Status: AC

## 2023-03-18 ENCOUNTER — Inpatient Hospital Stay: Payer: Medicare Other | Admitting: Nutrition

## 2023-03-18 ENCOUNTER — Inpatient Hospital Stay: Payer: Medicare Other | Admitting: Hematology

## 2023-03-18 ENCOUNTER — Inpatient Hospital Stay: Payer: Medicare Other

## 2023-03-18 VITALS — BP 137/84 | HR 70 | Temp 98.2°F | Resp 18 | Wt 190.5 lb

## 2023-03-18 DIAGNOSIS — Z5111 Encounter for antineoplastic chemotherapy: Secondary | ICD-10-CM | POA: Diagnosis not present

## 2023-03-18 DIAGNOSIS — C9000 Multiple myeloma not having achieved remission: Secondary | ICD-10-CM

## 2023-03-18 DIAGNOSIS — Z7189 Other specified counseling: Secondary | ICD-10-CM

## 2023-03-18 DIAGNOSIS — Z5112 Encounter for antineoplastic immunotherapy: Secondary | ICD-10-CM | POA: Diagnosis not present

## 2023-03-18 DIAGNOSIS — Z95828 Presence of other vascular implants and grafts: Secondary | ICD-10-CM

## 2023-03-18 DIAGNOSIS — C9001 Multiple myeloma in remission: Secondary | ICD-10-CM

## 2023-03-18 LAB — CBC WITH DIFFERENTIAL (CANCER CENTER ONLY)
Abs Immature Granulocytes: 0.01 10*3/uL (ref 0.00–0.07)
Basophils Absolute: 0 10*3/uL (ref 0.0–0.1)
Basophils Relative: 1 %
Eosinophils Absolute: 0.1 10*3/uL (ref 0.0–0.5)
Eosinophils Relative: 2 %
HCT: 34.2 % — ABNORMAL LOW (ref 39.0–52.0)
Hemoglobin: 12 g/dL — ABNORMAL LOW (ref 13.0–17.0)
Immature Granulocytes: 0 %
Lymphocytes Relative: 27 %
Lymphs Abs: 1.3 10*3/uL (ref 0.7–4.0)
MCH: 37 pg — ABNORMAL HIGH (ref 26.0–34.0)
MCHC: 35.1 g/dL (ref 30.0–36.0)
MCV: 105.6 fL — ABNORMAL HIGH (ref 80.0–100.0)
Monocytes Absolute: 0.4 10*3/uL (ref 0.1–1.0)
Monocytes Relative: 8 %
Neutro Abs: 2.8 10*3/uL (ref 1.7–7.7)
Neutrophils Relative %: 62 %
Platelet Count: 174 10*3/uL (ref 150–400)
RBC: 3.24 MIL/uL — ABNORMAL LOW (ref 4.22–5.81)
RDW: 13.2 % (ref 11.5–15.5)
WBC Count: 4.6 10*3/uL (ref 4.0–10.5)
nRBC: 0 % (ref 0.0–0.2)

## 2023-03-18 LAB — CMP (CANCER CENTER ONLY)
ALT: 16 U/L (ref 0–44)
AST: 23 U/L (ref 15–41)
Albumin: 4.1 g/dL (ref 3.5–5.0)
Alkaline Phosphatase: 58 U/L (ref 38–126)
Anion gap: 6 (ref 5–15)
BUN: 22 mg/dL (ref 8–23)
CO2: 27 mmol/L (ref 22–32)
Calcium: 9.7 mg/dL (ref 8.9–10.3)
Chloride: 107 mmol/L (ref 98–111)
Creatinine: 1.71 mg/dL — ABNORMAL HIGH (ref 0.61–1.24)
GFR, Estimated: 41 mL/min — ABNORMAL LOW (ref >60)
Glucose, Bld: 103 mg/dL — ABNORMAL HIGH (ref 70–99)
Potassium: 4.1 mmol/L (ref 3.5–5.1)
Sodium: 140 mmol/L (ref 135–145)
Total Bilirubin: 0.7 mg/dL (ref 0.3–1.2)
Total Protein: 5.8 g/dL — ABNORMAL LOW (ref 6.5–8.1)

## 2023-03-18 MED ORDER — ACETAMINOPHEN 500 MG PO TABS
1000.0000 mg | ORAL_TABLET | Freq: Once | ORAL | Status: AC
  Administered 2023-03-18: 1000 mg via ORAL
  Filled 2023-03-18: qty 2

## 2023-03-18 MED ORDER — SODIUM CHLORIDE 0.9 % IV SOLN: Freq: Once | INTRAVENOUS | Status: AC

## 2023-03-18 MED ORDER — SODIUM CHLORIDE 0.9% FLUSH
10.0000 mL | INTRAVENOUS | Status: DC | PRN
  Administered 2023-03-18: 10 mL

## 2023-03-18 MED ORDER — DEXTROSE 5 % IV SOLN
56.0000 mg/m2 | Freq: Once | INTRAVENOUS | Status: AC
  Administered 2023-03-18: 120 mg via INTRAVENOUS
  Filled 2023-03-18: qty 60

## 2023-03-18 MED ORDER — HEPARIN SOD (PORK) LOCK FLUSH 100 UNIT/ML IV SOLN
500.0000 [IU] | Freq: Once | INTRAVENOUS | Status: AC | PRN
  Administered 2023-03-18: 500 [IU]

## 2023-03-18 MED ORDER — PROCHLORPERAZINE MALEATE 10 MG PO TABS
10.0000 mg | ORAL_TABLET | Freq: Once | ORAL | Status: AC
  Administered 2023-03-18: 10 mg via ORAL
  Filled 2023-03-18: qty 1

## 2023-03-18 MED ORDER — SODIUM CHLORIDE 0.9 % IV SOLN
10.0000 mg | Freq: Once | INTRAVENOUS | Status: AC
  Administered 2023-03-18: 10 mg via INTRAVENOUS
  Filled 2023-03-18: qty 10

## 2023-03-18 NOTE — Progress Notes (Signed)
Patient seen by Dr. Addison Naegeli are within treatment parameters.  Labs reviewed: and are within treatment parameters./ Dr Candise Che aware CR: 1.71  Per physician team, patient is ready for treatment and there are NO modifications to the treatment plan.
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
## 2023-03-18 NOTE — Patient Instructions (Signed)
[facility]  Discharge Instructions: Thank you for choosing [facility] to provide your oncology and hematology care.   If you have a lab appointment with the Cancer Center, please go directly to the Cancer Center and check in at the registration area.   Wear comfortable clothing and clothing appropriate for easy access to any Portacath or PICC line.   We strive to give you quality time with your provider. You may need to reschedule your appointment if you arrive late (15 or more minutes).  Arriving late affects you and other patients whose appointments are after yours.  Also, if you miss three or more appointments without notifying the office, you may be dismissed from the clinic at the provider's discretion.      For prescription refill requests, have your pharmacy contact our office and allow 72 hours for refills to be completed.    Today you received the following chemotherapy and/or immunotherapy agents :  Kyprolis   To help prevent nausea and vomiting after your treatment, we encourage you to take your nausea medication as directed.  BELOW ARE SYMPTOMS THAT SHOULD BE REPORTED IMMEDIATELY: *FEVER GREATER THAN 100.4 F (38 C) OR HIGHER *CHILLS OR SWEATING *NAUSEA AND VOMITING THAT IS NOT CONTROLLED WITH YOUR NAUSEA MEDICATION *UNUSUAL SHORTNESS OF BREATH *UNUSUAL BRUISING OR BLEEDING *URINARY PROBLEMS (pain or burning when urinating, or frequent urination) *BOWEL PROBLEMS (unusual diarrhea, constipation, pain near the anus) TENDERNESS IN MOUTH AND THROAT WITH OR WITHOUT PRESENCE OF ULCERS (sore throat, sores in mouth, or a toothache) UNUSUAL RASH, SWELLING OR PAIN  UNUSUAL VAGINAL DISCHARGE OR ITCHING   Items with * indicate a potential emergency and should be followed up as soon as possible or go to the Emergency Department if any problems should occur.  Please show the CHEMOTHERAPY ALERT CARD or IMMUNOTHERAPY ALERT CARD at  check-in to the Emergency Department and triage nurse.  Should you have questions after your visit or need to cancel or reschedule your appointment, please contact [facility]  Dept: [phone]  and follow the prompts.  Office hours are 8:00 a.m. to 4:30 p.m. Monday - Friday. Please note that voicemails left after 4:00 p.m. may not be returned until the following business day.  We are closed weekends and major holidays. You have access to a nurse at all times for urgent questions. Please call the main number to the clinic Dept: [phone] and follow the prompts.   For any non-urgent questions, you may also contact your provider using MyChart. We now offer e-Visits for anyone 18 and older to request care online for non-urgent symptoms. For details visit [email or web address].   Also download the MyChart app! Go to the app store, search "MyChart", open the app, select [place], and log in with your MyChart username and password.   

## 2023-03-18 NOTE — Progress Notes (Signed)
77 year old male diagnosed with multiple myeloma and bone metastases.  He is followed by Dr. Candise Che.  He is receiving carfilzomib and Aredia.  Past medical history includes COVID-19, chronic kidney disease, depression, and hypertension.  Medications include B complex-C, Tums, vitamin D2, fentanyl patch, Folvite, and Zofran.  Labs include glucose 103 and creatinine 1.71.  Height: 5 feet 10 inches. Weight: 190 pounds 8 ounces. Usual body weight: Patient weighed 202 pounds 8 ounces August 20, 2022 BMI: 27.33.  Patient identified on MST report secondary to weight loss and poor appetite.  Patient reports he is currently eating well.  He denies nutrition impact symptoms.  He would like to try to increase his water intake.  States he likes water but is just not inclined to drink enough.  Nutrition diagnosis: Unintended weight loss related to cancer and associated treatments as evidenced by 6% weight loss over 8 months which is not clinically significant.  Intervention: Encouraged patient to try to eat smaller more frequent meals and snacks with increased protein.  Strive for weight maintenance. Reviewed ways to add increased fluids/hydration. Provided nutrition facts sheets. Provided contact information for questions.  Monitoring, evaluation, goals: Patient will tolerate adequate calories and protein to minimize further weight loss.  Next visit: To be scheduled as needed with upcoming treatments.  Please refer to RD if nutrition issues identified.  **Disclaimer: This note was dictated with voice recognition software. Similar sounding words can inadvertently be transcribed and this note may contain transcription errors which may not have been corrected upon publication of note.**

## 2023-03-21 ENCOUNTER — Other Ambulatory Visit: Payer: Self-pay

## 2023-03-24 ENCOUNTER — Encounter: Payer: Self-pay | Admitting: Hematology

## 2023-03-31 ENCOUNTER — Encounter: Payer: Self-pay | Admitting: Hematology

## 2023-03-31 MED FILL — Dexamethasone Sodium Phosphate Inj 100 MG/10ML: INTRAMUSCULAR | Qty: 1 | Status: AC

## 2023-04-01 ENCOUNTER — Inpatient Hospital Stay: Payer: Medicare Other | Admitting: Hematology

## 2023-04-01 ENCOUNTER — Inpatient Hospital Stay: Payer: Medicare Other

## 2023-04-01 ENCOUNTER — Inpatient Hospital Stay: Payer: Medicare Other | Attending: Hematology

## 2023-04-01 DIAGNOSIS — Z7189 Other specified counseling: Secondary | ICD-10-CM

## 2023-04-01 DIAGNOSIS — C9000 Multiple myeloma not having achieved remission: Secondary | ICD-10-CM

## 2023-04-01 DIAGNOSIS — C9001 Multiple myeloma in remission: Secondary | ICD-10-CM | POA: Diagnosis not present

## 2023-04-01 DIAGNOSIS — Z5112 Encounter for antineoplastic immunotherapy: Secondary | ICD-10-CM | POA: Insufficient documentation

## 2023-04-01 DIAGNOSIS — Z95828 Presence of other vascular implants and grafts: Secondary | ICD-10-CM

## 2023-04-01 LAB — CMP (CANCER CENTER ONLY)
ALT: 16 U/L (ref 0–44)
AST: 22 U/L (ref 15–41)
Albumin: 3.9 g/dL (ref 3.5–5.0)
Alkaline Phosphatase: 60 U/L (ref 38–126)
Anion gap: 5 (ref 5–15)
BUN: 18 mg/dL (ref 8–23)
CO2: 28 mmol/L (ref 22–32)
Calcium: 9.5 mg/dL (ref 8.9–10.3)
Chloride: 107 mmol/L (ref 98–111)
Creatinine: 1.49 mg/dL — ABNORMAL HIGH (ref 0.61–1.24)
GFR, Estimated: 48 mL/min — ABNORMAL LOW (ref >60)
Glucose, Bld: 124 mg/dL — ABNORMAL HIGH (ref 70–99)
Potassium: 4 mmol/L (ref 3.5–5.1)
Sodium: 140 mmol/L (ref 135–145)
Total Bilirubin: 0.6 mg/dL (ref 0.3–1.2)
Total Protein: 5.6 g/dL — ABNORMAL LOW (ref 6.5–8.1)

## 2023-04-01 LAB — CBC WITH DIFFERENTIAL (CANCER CENTER ONLY)
Abs Immature Granulocytes: 0.02 10*3/uL (ref 0.00–0.07)
Basophils Absolute: 0 10*3/uL (ref 0.0–0.1)
Basophils Relative: 1 %
Eosinophils Absolute: 0.1 10*3/uL (ref 0.0–0.5)
Eosinophils Relative: 3 %
HCT: 34.5 % — ABNORMAL LOW (ref 39.0–52.0)
Hemoglobin: 11.9 g/dL — ABNORMAL LOW (ref 13.0–17.0)
Immature Granulocytes: 1 %
Lymphocytes Relative: 25 %
Lymphs Abs: 1 10*3/uL (ref 0.7–4.0)
MCH: 37.1 pg — ABNORMAL HIGH (ref 26.0–34.0)
MCHC: 34.5 g/dL (ref 30.0–36.0)
MCV: 107.5 fL — ABNORMAL HIGH (ref 80.0–100.0)
Monocytes Absolute: 0.4 10*3/uL (ref 0.1–1.0)
Monocytes Relative: 10 %
Neutro Abs: 2.4 10*3/uL (ref 1.7–7.7)
Neutrophils Relative %: 60 %
Platelet Count: 151 10*3/uL (ref 150–400)
RBC: 3.21 MIL/uL — ABNORMAL LOW (ref 4.22–5.81)
RDW: 13.2 % (ref 11.5–15.5)
WBC Count: 4 10*3/uL (ref 4.0–10.5)
nRBC: 0 % (ref 0.0–0.2)

## 2023-04-01 MED ORDER — SODIUM CHLORIDE 0.9 % IV SOLN
10.0000 mg | Freq: Once | INTRAVENOUS | Status: AC
  Administered 2023-04-01: 10 mg via INTRAVENOUS
  Filled 2023-04-01: qty 10

## 2023-04-01 MED ORDER — SODIUM CHLORIDE 0.9 % IV SOLN: Freq: Once | INTRAVENOUS | Status: AC

## 2023-04-01 MED ORDER — ACETAMINOPHEN 500 MG PO TABS
1000.0000 mg | ORAL_TABLET | Freq: Once | ORAL | Status: AC
  Administered 2023-04-01: 1000 mg via ORAL
  Filled 2023-04-01: qty 2

## 2023-04-01 MED ORDER — SODIUM CHLORIDE 0.9% FLUSH
10.0000 mL | INTRAVENOUS | Status: DC | PRN
  Administered 2023-04-01: 10 mL

## 2023-04-01 MED ORDER — HEPARIN SOD (PORK) LOCK FLUSH 100 UNIT/ML IV SOLN
500.0000 [IU] | Freq: Once | INTRAVENOUS | Status: AC | PRN
  Administered 2023-04-01: 500 [IU]

## 2023-04-01 MED ORDER — PROCHLORPERAZINE MALEATE 10 MG PO TABS
10.0000 mg | ORAL_TABLET | Freq: Once | ORAL | Status: AC
  Administered 2023-04-01: 10 mg via ORAL
  Filled 2023-04-01: qty 1

## 2023-04-01 MED ORDER — DEXTROSE 5 % IV SOLN
56.0000 mg/m2 | Freq: Once | INTRAVENOUS | Status: AC
  Administered 2023-04-01: 120 mg via INTRAVENOUS
  Filled 2023-04-01: qty 60

## 2023-04-01 MED ORDER — SODIUM CHLORIDE 0.9 % IV SOLN
60.0000 mg | Freq: Once | INTRAVENOUS | Status: AC
  Administered 2023-04-01: 60 mg via INTRAVENOUS
  Filled 2023-04-01: qty 20

## 2023-04-01 MED ORDER — SODIUM CHLORIDE 0.9% FLUSH
10.0000 mL | Freq: Once | INTRAVENOUS | Status: AC
  Administered 2023-04-01: 10 mL

## 2023-04-01 NOTE — Patient Instructions (Signed)
[place] [facility]  Discharge Instructions: Thank you for choosing [facility] to provide your oncology and hematology care.   If you have a lab appointment with the Cancer Center, please go directly to the Cancer Center and check in at the registration area.   Wear comfortable clothing and clothing appropriate for easy access to any Portacath or PICC line.   We strive to give you quality time with your provider. You may need to reschedule your appointment if you arrive late (15 or more minutes).  Arriving late affects you and other patients whose appointments are after yours.  Also, if you miss three or more appointments without notifying the office, you may be dismissed from the clinic at the provider's discretion.      For prescription refill requests, have your pharmacy contact our office and allow 72 hours for refills to be completed.    Today you received the following chemotherapy and/or immunotherapy agent: Carfilzomib (Kyprolis) and Aredia   To help prevent nausea and vomiting after your treatment, we encourage you to take your nausea medication as directed.  BELOW ARE SYMPTOMS THAT SHOULD BE REPORTED IMMEDIATELY: *FEVER GREATER THAN 100.4 F (38 C) OR HIGHER *CHILLS OR SWEATING *NAUSEA AND VOMITING THAT IS NOT CONTROLLED WITH YOUR NAUSEA MEDICATION *UNUSUAL SHORTNESS OF BREATH *UNUSUAL BRUISING OR BLEEDING *URINARY PROBLEMS (pain or burning when urinating, or frequent urination) *BOWEL PROBLEMS (unusual diarrhea, constipation, pain near the anus) TENDERNESS IN MOUTH AND THROAT WITH OR WITHOUT PRESENCE OF ULCERS (sore throat, sores in mouth, or a toothache) UNUSUAL RASH, SWELLING OR PAIN  UNUSUAL VAGINAL DISCHARGE OR ITCHING   Items with * indicate a potential emergency and should be followed up as soon as possible or go to the Emergency Department if any problems should occur.  Please show the CHEMOTHERAPY ALERT CARD or IMMUNOTHERAPY  ALERT CARD at check-in to the Emergency Department and triage nurse.  Should you have questions after your visit or need to cancel or reschedule your appointment, please contact [place] [facility]  Dept: [phone]  and follow the prompts.  Office hours are 8:00 a.m. to 4:30 p.m. Monday - Friday. Please note that voicemails left after 4:00 p.m. may not be returned until the following business day.  We are closed weekends and major holidays. You have access to a nurse at all times for urgent questions. Please call the main number to the clinic Dept: [phone] and follow the prompts.   For any non-urgent questions, you may also contact your provider using MyChart. We now offer e-Visits for anyone 24 and older to request care online for non-urgent symptoms. For details visit [email or web address].   Also download the MyChart app! Go to the app store, search "MyChart", open the app, select [facility], and log in with your MyChart username and password.

## 2023-04-04 ENCOUNTER — Other Ambulatory Visit: Payer: Self-pay

## 2023-04-04 ENCOUNTER — Encounter: Payer: Self-pay | Admitting: Hematology

## 2023-04-04 DIAGNOSIS — C9001 Multiple myeloma in remission: Secondary | ICD-10-CM

## 2023-04-04 DIAGNOSIS — C9000 Multiple myeloma not having achieved remission: Secondary | ICD-10-CM

## 2023-04-04 MED ORDER — FENTANYL 12 MCG/HR TD PT72
1.0000 | MEDICATED_PATCH | TRANSDERMAL | 0 refills | Status: DC
Start: 2023-04-04 — End: 2023-05-05

## 2023-04-04 MED ORDER — TRAMADOL HCL 50 MG PO TABS
50.0000 mg | ORAL_TABLET | Freq: Four times a day (QID) | ORAL | 0 refills | Status: DC | PRN
Start: 2023-04-04 — End: 2023-05-05

## 2023-04-14 MED FILL — Dexamethasone Sodium Phosphate Inj 100 MG/10ML: INTRAMUSCULAR | Qty: 1 | Status: AC

## 2023-04-15 ENCOUNTER — Inpatient Hospital Stay: Payer: Medicare Other

## 2023-04-15 VITALS — BP 123/82 | HR 71 | Temp 98.5°F | Resp 16

## 2023-04-15 VITALS — Wt 194.8 lb

## 2023-04-15 DIAGNOSIS — Z7189 Other specified counseling: Secondary | ICD-10-CM

## 2023-04-15 DIAGNOSIS — Z5112 Encounter for antineoplastic immunotherapy: Secondary | ICD-10-CM | POA: Diagnosis not present

## 2023-04-15 DIAGNOSIS — C9000 Multiple myeloma not having achieved remission: Secondary | ICD-10-CM

## 2023-04-15 LAB — CBC WITH DIFFERENTIAL (CANCER CENTER ONLY)
Abs Immature Granulocytes: 0.01 10*3/uL (ref 0.00–0.07)
Basophils Absolute: 0 10*3/uL (ref 0.0–0.1)
Basophils Relative: 1 %
Eosinophils Absolute: 0.1 10*3/uL (ref 0.0–0.5)
Eosinophils Relative: 2 %
HCT: 34.8 % — ABNORMAL LOW (ref 39.0–52.0)
Hemoglobin: 11.8 g/dL — ABNORMAL LOW (ref 13.0–17.0)
Immature Granulocytes: 0 %
Lymphocytes Relative: 19 %
Lymphs Abs: 0.8 10*3/uL (ref 0.7–4.0)
MCH: 36.2 pg — ABNORMAL HIGH (ref 26.0–34.0)
MCHC: 33.9 g/dL (ref 30.0–36.0)
MCV: 106.7 fL — ABNORMAL HIGH (ref 80.0–100.0)
Monocytes Absolute: 0.4 10*3/uL (ref 0.1–1.0)
Monocytes Relative: 9 %
Neutro Abs: 3.1 10*3/uL (ref 1.7–7.7)
Neutrophils Relative %: 69 %
Platelet Count: 187 10*3/uL (ref 150–400)
RBC: 3.26 MIL/uL — ABNORMAL LOW (ref 4.22–5.81)
RDW: 13.2 % (ref 11.5–15.5)
WBC Count: 4.4 10*3/uL (ref 4.0–10.5)
nRBC: 0 % (ref 0.0–0.2)

## 2023-04-15 LAB — COMPREHENSIVE METABOLIC PANEL
ALT: 15 U/L (ref 0–44)
AST: 20 U/L (ref 15–41)
Albumin: 4 g/dL (ref 3.5–5.0)
Alkaline Phosphatase: 60 U/L (ref 38–126)
Anion gap: 6 (ref 5–15)
BUN: 22 mg/dL (ref 8–23)
CO2: 27 mmol/L (ref 22–32)
Calcium: 9.6 mg/dL (ref 8.9–10.3)
Chloride: 106 mmol/L (ref 98–111)
Creatinine, Ser: 1.61 mg/dL — ABNORMAL HIGH (ref 0.61–1.24)
GFR, Estimated: 44 mL/min — ABNORMAL LOW (ref >60)
Glucose, Bld: 130 mg/dL — ABNORMAL HIGH (ref 70–99)
Potassium: 4.2 mmol/L (ref 3.5–5.1)
Sodium: 139 mmol/L (ref 135–145)
Total Bilirubin: 0.8 mg/dL (ref 0.3–1.2)
Total Protein: 5.9 g/dL — ABNORMAL LOW (ref 6.5–8.1)

## 2023-04-15 MED ORDER — SODIUM CHLORIDE 0.9 % IV SOLN
10.0000 mg | Freq: Once | INTRAVENOUS | Status: AC
  Administered 2023-04-15: 10 mg via INTRAVENOUS
  Filled 2023-04-15: qty 10

## 2023-04-15 MED ORDER — SODIUM CHLORIDE 0.9% FLUSH
10.0000 mL | INTRAVENOUS | Status: DC | PRN
  Administered 2023-04-15: 10 mL

## 2023-04-15 MED ORDER — PROCHLORPERAZINE MALEATE 10 MG PO TABS
10.0000 mg | ORAL_TABLET | Freq: Once | ORAL | Status: AC
  Administered 2023-04-15: 10 mg via ORAL
  Filled 2023-04-15: qty 1

## 2023-04-15 MED ORDER — DEXTROSE 5 % IV SOLN
56.0000 mg/m2 | Freq: Once | INTRAVENOUS | Status: AC
  Administered 2023-04-15: 120 mg via INTRAVENOUS
  Filled 2023-04-15: qty 60

## 2023-04-15 MED ORDER — SODIUM CHLORIDE 0.9 % IV SOLN: Freq: Once | INTRAVENOUS | Status: AC

## 2023-04-15 MED ORDER — HEPARIN SOD (PORK) LOCK FLUSH 100 UNIT/ML IV SOLN
500.0000 [IU] | Freq: Once | INTRAVENOUS | Status: AC | PRN
  Administered 2023-04-15: 500 [IU]

## 2023-04-15 MED ORDER — ACETAMINOPHEN 500 MG PO TABS
1000.0000 mg | ORAL_TABLET | Freq: Once | ORAL | Status: AC
  Administered 2023-04-15: 1000 mg via ORAL
  Filled 2023-04-15: qty 2

## 2023-04-15 NOTE — Progress Notes (Signed)
Ok to treat with creat 1.61 mg/dL per Dr Candise Che

## 2023-04-15 NOTE — Patient Instructions (Signed)
[facility]  Discharge Instructions: Thank you for choosing [facility] to provide your oncology and hematology care.   If you have a lab appointment with the Cancer Center, please go directly to the Cancer Center and check in at the registration area.   Wear comfortable clothing and clothing appropriate for easy access to any Portacath or PICC line.   We strive to give you quality time with your provider. You may need to reschedule your appointment if you arrive late (15 or more minutes).  Arriving late affects you and other patients whose appointments are after yours.  Also, if you miss three or more appointments without notifying the office, you may be dismissed from the clinic at the provider's discretion.      For prescription refill requests, have your pharmacy contact our office and allow 72 hours for refills to be completed.    Today you received the following chemotherapy and/or immunotherapy agents :  Kyprolis   To help prevent nausea and vomiting after your treatment, we encourage you to take your nausea medication as directed.  BELOW ARE SYMPTOMS THAT SHOULD BE REPORTED IMMEDIATELY: *FEVER GREATER THAN 100.4 F (38 C) OR HIGHER *CHILLS OR SWEATING *NAUSEA AND VOMITING THAT IS NOT CONTROLLED WITH YOUR NAUSEA MEDICATION *UNUSUAL SHORTNESS OF BREATH *UNUSUAL BRUISING OR BLEEDING *URINARY PROBLEMS (pain or burning when urinating, or frequent urination) *BOWEL PROBLEMS (unusual diarrhea, constipation, pain near the anus) TENDERNESS IN MOUTH AND THROAT WITH OR WITHOUT PRESENCE OF ULCERS (sore throat, sores in mouth, or a toothache) UNUSUAL RASH, SWELLING OR PAIN  UNUSUAL VAGINAL DISCHARGE OR ITCHING   Items with * indicate a potential emergency and should be followed up as soon as possible or go to the Emergency Department if any problems should occur.  Please show the CHEMOTHERAPY ALERT CARD or IMMUNOTHERAPY ALERT CARD at  check-in to the Emergency Department and triage nurse.  Should you have questions after your visit or need to cancel or reschedule your appointment, please contact [facility]  Dept: [phone]  and follow the prompts.  Office hours are 8:00 a.m. to 4:30 p.m. Monday - Friday. Please note that voicemails left after 4:00 p.m. may not be returned until the following business day.  We are closed weekends and major holidays. You have access to a nurse at all times for urgent questions. Please call the main number to the clinic Dept: [phone] and follow the prompts.   For any non-urgent questions, you may also contact your provider using MyChart. We now offer e-Visits for anyone 18 and older to request care online for non-urgent symptoms. For details visit [email or web address].   Also download the MyChart app! Go to the app store, search "MyChart", open the app, select [place], and log in with your MyChart username and password.   

## 2023-04-25 ENCOUNTER — Other Ambulatory Visit: Payer: Self-pay | Admitting: Hematology

## 2023-04-25 ENCOUNTER — Other Ambulatory Visit: Payer: Self-pay | Admitting: Internal Medicine

## 2023-04-25 DIAGNOSIS — C9000 Multiple myeloma not having achieved remission: Secondary | ICD-10-CM

## 2023-04-26 ENCOUNTER — Encounter: Payer: Self-pay | Admitting: Hematology

## 2023-04-27 ENCOUNTER — Encounter: Payer: Self-pay | Admitting: Hematology

## 2023-04-27 NOTE — Progress Notes (Signed)
This encounter was created in error - please disregard.

## 2023-04-28 ENCOUNTER — Encounter: Payer: Self-pay | Admitting: Hematology

## 2023-04-28 ENCOUNTER — Other Ambulatory Visit: Payer: Self-pay

## 2023-04-28 DIAGNOSIS — C9001 Multiple myeloma in remission: Secondary | ICD-10-CM

## 2023-04-28 DIAGNOSIS — C9000 Multiple myeloma not having achieved remission: Secondary | ICD-10-CM

## 2023-04-28 MED FILL — Dexamethasone Sodium Phosphate Inj 100 MG/10ML: INTRAMUSCULAR | Qty: 1 | Status: AC

## 2023-04-29 ENCOUNTER — Encounter: Payer: Self-pay | Admitting: Hematology

## 2023-04-29 ENCOUNTER — Inpatient Hospital Stay: Payer: Medicare Other | Attending: Hematology

## 2023-04-29 ENCOUNTER — Inpatient Hospital Stay: Payer: Medicare Other

## 2023-04-29 VITALS — BP 129/89 | HR 82 | Temp 97.9°F | Resp 17 | Wt 197.5 lb

## 2023-04-29 DIAGNOSIS — C9000 Multiple myeloma not having achieved remission: Secondary | ICD-10-CM | POA: Diagnosis not present

## 2023-04-29 DIAGNOSIS — Z95828 Presence of other vascular implants and grafts: Secondary | ICD-10-CM

## 2023-04-29 DIAGNOSIS — Z5112 Encounter for antineoplastic immunotherapy: Secondary | ICD-10-CM | POA: Insufficient documentation

## 2023-04-29 DIAGNOSIS — Z7189 Other specified counseling: Secondary | ICD-10-CM

## 2023-04-29 LAB — CBC WITH DIFFERENTIAL (CANCER CENTER ONLY)
Abs Immature Granulocytes: 0.01 10*3/uL (ref 0.00–0.07)
Basophils Absolute: 0 10*3/uL (ref 0.0–0.1)
Basophils Relative: 1 %
Eosinophils Absolute: 0.1 10*3/uL (ref 0.0–0.5)
Eosinophils Relative: 1 %
HCT: 33.8 % — ABNORMAL LOW (ref 39.0–52.0)
Hemoglobin: 12.1 g/dL — ABNORMAL LOW (ref 13.0–17.0)
Immature Granulocytes: 0 %
Lymphocytes Relative: 22 %
Lymphs Abs: 1.1 10*3/uL (ref 0.7–4.0)
MCH: 37.8 pg — ABNORMAL HIGH (ref 26.0–34.0)
MCHC: 35.8 g/dL (ref 30.0–36.0)
MCV: 105.6 fL — ABNORMAL HIGH (ref 80.0–100.0)
Monocytes Absolute: 0.4 10*3/uL (ref 0.1–1.0)
Monocytes Relative: 7 %
Neutro Abs: 3.5 10*3/uL (ref 1.7–7.7)
Neutrophils Relative %: 69 %
Platelet Count: 154 10*3/uL (ref 150–400)
RBC: 3.2 MIL/uL — ABNORMAL LOW (ref 4.22–5.81)
RDW: 13.6 % (ref 11.5–15.5)
WBC Count: 5.1 10*3/uL (ref 4.0–10.5)
nRBC: 0 % (ref 0.0–0.2)

## 2023-04-29 LAB — CMP (CANCER CENTER ONLY)
ALT: 16 U/L (ref 0–44)
AST: 21 U/L (ref 15–41)
Albumin: 4.1 g/dL (ref 3.5–5.0)
Alkaline Phosphatase: 64 U/L (ref 38–126)
Anion gap: 5 (ref 5–15)
BUN: 20 mg/dL (ref 8–23)
CO2: 27 mmol/L (ref 22–32)
Calcium: 9.9 mg/dL (ref 8.9–10.3)
Chloride: 107 mmol/L (ref 98–111)
Creatinine: 1.62 mg/dL — ABNORMAL HIGH (ref 0.61–1.24)
GFR, Estimated: 44 mL/min — ABNORMAL LOW (ref >60)
Glucose, Bld: 106 mg/dL — ABNORMAL HIGH (ref 70–99)
Potassium: 4.5 mmol/L (ref 3.5–5.1)
Sodium: 139 mmol/L (ref 135–145)
Total Bilirubin: 0.7 mg/dL (ref 0.3–1.2)
Total Protein: 5.9 g/dL — ABNORMAL LOW (ref 6.5–8.1)

## 2023-04-29 MED ORDER — SODIUM CHLORIDE 0.9 % IV SOLN: Freq: Once | INTRAVENOUS | Status: AC

## 2023-04-29 MED ORDER — PROCHLORPERAZINE MALEATE 10 MG PO TABS
10.0000 mg | ORAL_TABLET | Freq: Once | ORAL | Status: AC
  Administered 2023-04-29: 10 mg via ORAL
  Filled 2023-04-29: qty 1

## 2023-04-29 MED ORDER — HEPARIN SOD (PORK) LOCK FLUSH 100 UNIT/ML IV SOLN
500.0000 [IU] | Freq: Once | INTRAVENOUS | Status: AC | PRN
  Administered 2023-04-29: 500 [IU]

## 2023-04-29 MED ORDER — SODIUM CHLORIDE 0.9% FLUSH
10.0000 mL | Freq: Once | INTRAVENOUS | Status: AC
  Administered 2023-04-29: 10 mL

## 2023-04-29 MED ORDER — ACETAMINOPHEN 500 MG PO TABS
1000.0000 mg | ORAL_TABLET | Freq: Once | ORAL | Status: AC
  Administered 2023-04-29: 1000 mg via ORAL
  Filled 2023-04-29: qty 2

## 2023-04-29 MED ORDER — DEXTROSE 5 % IV SOLN
56.0000 mg/m2 | Freq: Once | INTRAVENOUS | Status: AC
  Administered 2023-04-29: 120 mg via INTRAVENOUS
  Filled 2023-04-29: qty 60

## 2023-04-29 MED ORDER — SODIUM CHLORIDE 0.9 % IV SOLN
10.0000 mg | Freq: Once | INTRAVENOUS | Status: AC
  Administered 2023-04-29: 10 mg via INTRAVENOUS
  Filled 2023-04-29: qty 10

## 2023-04-29 MED ORDER — TRAZODONE HCL 50 MG PO TABS: 50.0000 mg | ORAL_TABLET | Freq: Every evening | ORAL | 1 refills | Status: DC | PRN

## 2023-04-29 MED ORDER — SODIUM CHLORIDE 0.9% FLUSH
10.0000 mL | INTRAVENOUS | Status: DC | PRN
  Administered 2023-04-29: 10 mL

## 2023-04-29 NOTE — Patient Instructions (Signed)
[facility]  Discharge Instructions: Thank you for choosing [facility] to provide your oncology and hematology care.   If you have a lab appointment with the Cancer Center, please go directly to the Cancer Center and check in at the registration area.   Wear comfortable clothing and clothing appropriate for easy access to any Portacath or PICC line.   We strive to give you quality time with your provider. You may need to reschedule your appointment if you arrive late (15 or more minutes).  Arriving late affects you and other patients whose appointments are after yours.  Also, if you miss three or more appointments without notifying the office, you may be dismissed from the clinic at the provider's discretion.      For prescription refill requests, have your pharmacy contact our office and allow 72 hours for refills to be completed.    Today you received the following chemotherapy and/or immunotherapy agents :  Kyprolis   To help prevent nausea and vomiting after your treatment, we encourage you to take your nausea medication as directed.  BELOW ARE SYMPTOMS THAT SHOULD BE REPORTED IMMEDIATELY: *FEVER GREATER THAN 100.4 F (38 C) OR HIGHER *CHILLS OR SWEATING *NAUSEA AND VOMITING THAT IS NOT CONTROLLED WITH YOUR NAUSEA MEDICATION *UNUSUAL SHORTNESS OF BREATH *UNUSUAL BRUISING OR BLEEDING *URINARY PROBLEMS (pain or burning when urinating, or frequent urination) *BOWEL PROBLEMS (unusual diarrhea, constipation, pain near the anus) TENDERNESS IN MOUTH AND THROAT WITH OR WITHOUT PRESENCE OF ULCERS (sore throat, sores in mouth, or a toothache) UNUSUAL RASH, SWELLING OR PAIN  UNUSUAL VAGINAL DISCHARGE OR ITCHING   Items with * indicate a potential emergency and should be followed up as soon as possible or go to the Emergency Department if any problems should occur.  Please show the CHEMOTHERAPY ALERT CARD or IMMUNOTHERAPY ALERT CARD at  check-in to the Emergency Department and triage nurse.  Should you have questions after your visit or need to cancel or reschedule your appointment, please contact [facility]  Dept: [phone]  and follow the prompts.  Office hours are 8:00 a.m. to 4:30 p.m. Monday - Friday. Please note that voicemails left after 4:00 p.m. may not be returned until the following business day.  We are closed weekends and major holidays. You have access to a nurse at all times for urgent questions. Please call the main number to the clinic Dept: [phone] and follow the prompts.   For any non-urgent questions, you may also contact your provider using MyChart. We now offer e-Visits for anyone 18 and older to request care online for non-urgent symptoms. For details visit [email or web address].   Also download the MyChart app! Go to the app store, search "MyChart", open the app, select [place], and log in with your MyChart username and password.   

## 2023-04-29 NOTE — Progress Notes (Signed)
Pt ok for tx today with Cr: 1.62 per Dr Candise Che.

## 2023-04-30 ENCOUNTER — Telehealth: Payer: Self-pay | Admitting: Hematology

## 2023-04-30 NOTE — Telephone Encounter (Signed)
Patient is aware of rescheduled appointment times/dates due to availability.

## 2023-05-01 LAB — MULTIPLE MYELOMA PANEL, SERUM
Albumin SerPl Elph-Mcnc: 3.9 g/dL (ref 2.9–4.4)
Albumin/Glob SerPl: 2.2 — ABNORMAL HIGH (ref 0.7–1.7)
Alpha 1: 0.2 g/dL (ref 0.0–0.4)
Alpha2 Glob SerPl Elph-Mcnc: 0.6 g/dL (ref 0.4–1.0)
B-Globulin SerPl Elph-Mcnc: 0.8 g/dL (ref 0.7–1.3)
Gamma Glob SerPl Elph-Mcnc: 0.1 g/dL — ABNORMAL LOW (ref 0.4–1.8)
Globulin, Total: 1.8 g/dL — ABNORMAL LOW (ref 2.2–3.9)
IgA: 22 mg/dL — ABNORMAL LOW (ref 61–437)
IgG (Immunoglobin G), Serum: 200 mg/dL — ABNORMAL LOW (ref 603–1613)
IgM (Immunoglobulin M), Srm: 29 mg/dL (ref 15–143)
Total Protein ELP: 5.7 g/dL — ABNORMAL LOW (ref 6.0–8.5)

## 2023-05-05 ENCOUNTER — Other Ambulatory Visit: Payer: Self-pay

## 2023-05-05 ENCOUNTER — Encounter: Payer: Self-pay | Admitting: Hematology

## 2023-05-05 DIAGNOSIS — C9000 Multiple myeloma not having achieved remission: Secondary | ICD-10-CM

## 2023-05-05 DIAGNOSIS — C9001 Multiple myeloma in remission: Secondary | ICD-10-CM

## 2023-05-07 ENCOUNTER — Encounter: Payer: Self-pay | Admitting: Hematology

## 2023-05-07 MED ORDER — TIZANIDINE HCL 2 MG PO CAPS: 2.0000 mg | ORAL_CAPSULE | Freq: Three times a day (TID) | ORAL | 0 refills | Status: DC | PRN

## 2023-05-07 MED ORDER — FENTANYL 12 MCG/HR TD PT72: 1.0000 | MEDICATED_PATCH | TRANSDERMAL | 0 refills | Status: DC

## 2023-05-07 MED ORDER — TRAMADOL HCL 50 MG PO TABS: 50.0000 mg | ORAL_TABLET | Freq: Four times a day (QID) | ORAL | 0 refills | Status: DC | PRN

## 2023-05-08 MED ORDER — ACETAMINOPHEN 10 MG/ML IV SOLN
INTRAVENOUS | Status: AC
  Filled 2023-05-08: qty 100

## 2023-05-12 ENCOUNTER — Other Ambulatory Visit: Payer: Self-pay

## 2023-05-13 ENCOUNTER — Other Ambulatory Visit: Payer: Self-pay

## 2023-05-13 ENCOUNTER — Other Ambulatory Visit: Payer: Medicare Other

## 2023-05-13 ENCOUNTER — Ambulatory Visit: Payer: Medicare Other

## 2023-05-13 ENCOUNTER — Ambulatory Visit: Payer: Medicare Other | Admitting: Hematology

## 2023-05-13 DIAGNOSIS — C9000 Multiple myeloma not having achieved remission: Secondary | ICD-10-CM

## 2023-05-14 ENCOUNTER — Inpatient Hospital Stay: Payer: Medicare Other

## 2023-05-14 ENCOUNTER — Inpatient Hospital Stay: Payer: Medicare Other | Admitting: Hematology

## 2023-05-14 VITALS — BP 123/80 | HR 83 | Temp 98.3°F | Resp 18

## 2023-05-14 DIAGNOSIS — C9000 Multiple myeloma not having achieved remission: Secondary | ICD-10-CM

## 2023-05-14 DIAGNOSIS — Z5112 Encounter for antineoplastic immunotherapy: Secondary | ICD-10-CM | POA: Diagnosis not present

## 2023-05-14 DIAGNOSIS — Z7189 Other specified counseling: Secondary | ICD-10-CM

## 2023-05-14 DIAGNOSIS — Z95828 Presence of other vascular implants and grafts: Secondary | ICD-10-CM

## 2023-05-14 DIAGNOSIS — Z5111 Encounter for antineoplastic chemotherapy: Secondary | ICD-10-CM

## 2023-05-14 LAB — CMP (CANCER CENTER ONLY)
ALT: 15 U/L (ref 0–44)
AST: 19 U/L (ref 15–41)
Albumin: 4 g/dL (ref 3.5–5.0)
Alkaline Phosphatase: 71 U/L (ref 38–126)
Anion gap: 6 (ref 5–15)
BUN: 20 mg/dL (ref 8–23)
CO2: 27 mmol/L (ref 22–32)
Calcium: 9.5 mg/dL (ref 8.9–10.3)
Chloride: 106 mmol/L (ref 98–111)
Creatinine: 1.62 mg/dL — ABNORMAL HIGH (ref 0.61–1.24)
GFR, Estimated: 44 mL/min — ABNORMAL LOW (ref >60)
Glucose, Bld: 112 mg/dL — ABNORMAL HIGH (ref 70–99)
Potassium: 4.1 mmol/L (ref 3.5–5.1)
Sodium: 139 mmol/L (ref 135–145)
Total Bilirubin: 0.5 mg/dL (ref 0.3–1.2)
Total Protein: 5.9 g/dL — ABNORMAL LOW (ref 6.5–8.1)

## 2023-05-14 LAB — CBC WITH DIFFERENTIAL (CANCER CENTER ONLY)
Abs Immature Granulocytes: 0.01 10*3/uL (ref 0.00–0.07)
Basophils Absolute: 0 10*3/uL (ref 0.0–0.1)
Basophils Relative: 1 %
Eosinophils Absolute: 0.1 10*3/uL (ref 0.0–0.5)
Eosinophils Relative: 2 %
HCT: 34.4 % — ABNORMAL LOW (ref 39.0–52.0)
Hemoglobin: 11.6 g/dL — ABNORMAL LOW (ref 13.0–17.0)
Immature Granulocytes: 0 %
Lymphocytes Relative: 23 %
Lymphs Abs: 1.1 10*3/uL (ref 0.7–4.0)
MCH: 36.5 pg — ABNORMAL HIGH (ref 26.0–34.0)
MCHC: 33.7 g/dL (ref 30.0–36.0)
MCV: 108.2 fL — ABNORMAL HIGH (ref 80.0–100.0)
Monocytes Absolute: 0.5 10*3/uL (ref 0.1–1.0)
Monocytes Relative: 10 %
Neutro Abs: 3 10*3/uL (ref 1.7–7.7)
Neutrophils Relative %: 64 %
Platelet Count: 168 10*3/uL (ref 150–400)
RBC: 3.18 MIL/uL — ABNORMAL LOW (ref 4.22–5.81)
RDW: 13.6 % (ref 11.5–15.5)
WBC Count: 4.6 10*3/uL (ref 4.0–10.5)
nRBC: 0 % (ref 0.0–0.2)

## 2023-05-14 MED ORDER — SODIUM CHLORIDE 0.9% FLUSH
10.0000 mL | Freq: Once | INTRAVENOUS | Status: AC
  Administered 2023-05-14: 10 mL

## 2023-05-14 MED ORDER — ACETAMINOPHEN 500 MG PO TABS
1000.0000 mg | ORAL_TABLET | Freq: Once | ORAL | Status: AC
  Administered 2023-05-14: 1000 mg via ORAL
  Filled 2023-05-14: qty 2

## 2023-05-14 MED ORDER — HEPARIN SOD (PORK) LOCK FLUSH 100 UNIT/ML IV SOLN
500.0000 [IU] | Freq: Once | INTRAVENOUS | Status: AC | PRN
  Administered 2023-05-14: 500 [IU]

## 2023-05-14 MED ORDER — SODIUM CHLORIDE 0.9 % IV SOLN: Freq: Once | INTRAVENOUS | Status: DC

## 2023-05-14 MED ORDER — SODIUM CHLORIDE 0.9% FLUSH
10.0000 mL | INTRAVENOUS | Status: DC | PRN
Start: 2023-05-14 — End: 2023-05-14
  Administered 2023-05-14: 10 mL

## 2023-05-14 MED ORDER — CARFILZOMIB CHEMO INJECTION 60 MG
56.0000 mg/m2 | Freq: Once | INTRAVENOUS | Status: AC
  Administered 2023-05-14: 120 mg via INTRAVENOUS
  Filled 2023-05-14: qty 60

## 2023-05-14 MED ORDER — DEXAMETHASONE SODIUM PHOSPHATE 10 MG/ML IJ SOLN
10.0000 mg | Freq: Once | INTRAMUSCULAR | Status: AC
  Administered 2023-05-14: 10 mg via INTRAVENOUS
  Filled 2023-05-14: qty 1

## 2023-05-14 MED ORDER — PROCHLORPERAZINE MALEATE 10 MG PO TABS
10.0000 mg | ORAL_TABLET | Freq: Once | ORAL | Status: AC
  Administered 2023-05-14: 10 mg via ORAL
  Filled 2023-05-14: qty 1

## 2023-05-14 MED ORDER — SODIUM CHLORIDE 0.9 % IV SOLN: Freq: Once | INTRAVENOUS | Status: AC
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
## 2023-05-14 NOTE — Progress Notes (Signed)
Pt ok for tx today per Dr Candise Che. With CR: 1.62

## 2023-05-20 ENCOUNTER — Encounter: Payer: Self-pay | Admitting: Hematology

## 2023-05-23 ENCOUNTER — Other Ambulatory Visit: Payer: Self-pay

## 2023-05-23 DIAGNOSIS — C9000 Multiple myeloma not having achieved remission: Secondary | ICD-10-CM

## 2023-05-26 ENCOUNTER — Encounter: Payer: Self-pay | Admitting: Hematology

## 2023-05-27 ENCOUNTER — Inpatient Hospital Stay: Payer: Medicare Other

## 2023-05-27 ENCOUNTER — Other Ambulatory Visit: Payer: Self-pay | Admitting: Hematology

## 2023-05-27 ENCOUNTER — Inpatient Hospital Stay: Payer: Medicare Other | Attending: Hematology

## 2023-05-27 VITALS — BP 110/72 | HR 57 | Temp 97.8°F | Resp 18 | Wt 193.5 lb

## 2023-05-27 DIAGNOSIS — C9000 Multiple myeloma not having achieved remission: Secondary | ICD-10-CM

## 2023-05-27 DIAGNOSIS — Z5112 Encounter for antineoplastic immunotherapy: Secondary | ICD-10-CM | POA: Insufficient documentation

## 2023-05-27 DIAGNOSIS — C9001 Multiple myeloma in remission: Secondary | ICD-10-CM | POA: Insufficient documentation

## 2023-05-27 DIAGNOSIS — Z7189 Other specified counseling: Secondary | ICD-10-CM

## 2023-05-27 DIAGNOSIS — Z95828 Presence of other vascular implants and grafts: Secondary | ICD-10-CM

## 2023-05-27 LAB — CMP (CANCER CENTER ONLY)
ALT: 14 U/L (ref 0–44)
AST: 22 U/L (ref 15–41)
Albumin: 3.7 g/dL (ref 3.5–5.0)
Alkaline Phosphatase: 59 U/L (ref 38–126)
Anion gap: 5 (ref 5–15)
BUN: 17 mg/dL (ref 8–23)
CO2: 27 mmol/L (ref 22–32)
Calcium: 9.4 mg/dL (ref 8.9–10.3)
Chloride: 106 mmol/L (ref 98–111)
Creatinine: 1.64 mg/dL — ABNORMAL HIGH (ref 0.61–1.24)
GFR, Estimated: 43 mL/min — ABNORMAL LOW (ref >60)
Glucose, Bld: 136 mg/dL — ABNORMAL HIGH (ref 70–99)
Potassium: 4.5 mmol/L (ref 3.5–5.1)
Sodium: 138 mmol/L (ref 135–145)
Total Bilirubin: 0.7 mg/dL (ref <1.2)
Total Protein: 5.6 g/dL — ABNORMAL LOW (ref 6.5–8.1)

## 2023-05-27 LAB — CBC WITH DIFFERENTIAL (CANCER CENTER ONLY)
Abs Immature Granulocytes: 0.01 10*3/uL (ref 0.00–0.07)
Basophils Absolute: 0 10*3/uL (ref 0.0–0.1)
Basophils Relative: 1 %
Eosinophils Absolute: 0.1 10*3/uL (ref 0.0–0.5)
Eosinophils Relative: 3 %
HCT: 32.8 % — ABNORMAL LOW (ref 39.0–52.0)
Hemoglobin: 11.3 g/dL — ABNORMAL LOW (ref 13.0–17.0)
Immature Granulocytes: 0 %
Lymphocytes Relative: 23 %
Lymphs Abs: 0.9 10*3/uL (ref 0.7–4.0)
MCH: 36.8 pg — ABNORMAL HIGH (ref 26.0–34.0)
MCHC: 34.5 g/dL (ref 30.0–36.0)
MCV: 106.8 fL — ABNORMAL HIGH (ref 80.0–100.0)
Monocytes Absolute: 0.4 10*3/uL (ref 0.1–1.0)
Monocytes Relative: 10 %
Neutro Abs: 2.4 10*3/uL (ref 1.7–7.7)
Neutrophils Relative %: 63 %
Platelet Count: 151 10*3/uL (ref 150–400)
RBC: 3.07 MIL/uL — ABNORMAL LOW (ref 4.22–5.81)
RDW: 13.5 % (ref 11.5–15.5)
WBC Count: 3.9 10*3/uL — ABNORMAL LOW (ref 4.0–10.5)
nRBC: 0 % (ref 0.0–0.2)

## 2023-05-27 MED ORDER — DEXTROSE 5 % IV SOLN
56.0000 mg/m2 | Freq: Once | INTRAVENOUS | Status: AC
  Administered 2023-05-27: 120 mg via INTRAVENOUS
  Filled 2023-05-27: qty 60

## 2023-05-27 MED ORDER — SODIUM CHLORIDE 0.9% FLUSH
10.0000 mL | INTRAVENOUS | Status: DC | PRN
Start: 2023-05-27 — End: 2023-05-27
  Administered 2023-05-27: 10 mL

## 2023-05-27 MED ORDER — SODIUM CHLORIDE 0.9 % IV SOLN: Freq: Once | INTRAVENOUS | Status: DC

## 2023-05-27 MED ORDER — ACETAMINOPHEN 500 MG PO TABS
1000.0000 mg | ORAL_TABLET | Freq: Once | ORAL | Status: AC
  Administered 2023-05-27: 1000 mg via ORAL
  Filled 2023-05-27: qty 2

## 2023-05-27 MED ORDER — HEPARIN SOD (PORK) LOCK FLUSH 100 UNIT/ML IV SOLN
500.0000 [IU] | Freq: Once | INTRAVENOUS | Status: AC | PRN
  Administered 2023-05-27: 500 [IU]

## 2023-05-27 MED ORDER — SODIUM CHLORIDE 0.9% FLUSH
10.0000 mL | Freq: Once | INTRAVENOUS | Status: AC
  Administered 2023-05-27: 10 mL

## 2023-05-27 MED ORDER — SODIUM CHLORIDE 0.9 % IV SOLN: Freq: Once | INTRAVENOUS | Status: AC

## 2023-05-27 MED ORDER — PROCHLORPERAZINE MALEATE 10 MG PO TABS
10.0000 mg | ORAL_TABLET | Freq: Once | ORAL | Status: AC
Start: 2023-05-27 — End: 2023-05-27
  Administered 2023-05-27: 10 mg via ORAL
  Filled 2023-05-27: qty 1

## 2023-05-27 MED ORDER — DEXAMETHASONE SODIUM PHOSPHATE 10 MG/ML IJ SOLN
10.0000 mg | Freq: Once | INTRAMUSCULAR | Status: AC
  Administered 2023-05-27: 10 mg via INTRAVENOUS
  Filled 2023-05-27: qty 1

## 2023-05-27 NOTE — Progress Notes (Signed)
Dr Candise Che aware CR: 1.64. Pt ok for tx today.

## 2023-05-27 NOTE — Progress Notes (Signed)
Per Candise Che MD, ok to treat with SCR 1.64 today.

## 2023-06-03 ENCOUNTER — Other Ambulatory Visit: Payer: Self-pay

## 2023-06-04 ENCOUNTER — Other Ambulatory Visit: Payer: Self-pay

## 2023-06-04 ENCOUNTER — Encounter: Payer: Self-pay | Admitting: Hematology

## 2023-06-04 DIAGNOSIS — C9000 Multiple myeloma not having achieved remission: Secondary | ICD-10-CM

## 2023-06-04 DIAGNOSIS — C9001 Multiple myeloma in remission: Secondary | ICD-10-CM

## 2023-06-04 MED ORDER — LIDOCAINE-PRILOCAINE 2.5-2.5 % EX CREA: 1.0000 | TOPICAL_CREAM | CUTANEOUS | 0 refills | Status: AC | PRN

## 2023-06-04 MED ORDER — FENTANYL 12 MCG/HR TD PT72: 1.0000 | MEDICATED_PATCH | TRANSDERMAL | 0 refills | Status: DC

## 2023-06-04 MED ORDER — TIZANIDINE HCL 2 MG PO CAPS: 2.0000 mg | ORAL_CAPSULE | Freq: Three times a day (TID) | ORAL | 0 refills | Status: DC | PRN

## 2023-06-04 MED ORDER — TRAMADOL HCL 50 MG PO TABS: 50.0000 mg | ORAL_TABLET | Freq: Four times a day (QID) | ORAL | 0 refills | Status: DC | PRN

## 2023-06-05 LAB — MULTIPLE MYELOMA PANEL, SERUM
Albumin SerPl Elph-Mcnc: 3.5 g/dL (ref 2.9–4.4)
Albumin/Glob SerPl: 2.2 — ABNORMAL HIGH (ref 0.7–1.7)
Alpha 1: 0.3 g/dL (ref 0.0–0.4)
Alpha2 Glob SerPl Elph-Mcnc: 0.5 g/dL (ref 0.4–1.0)
B-Globulin SerPl Elph-Mcnc: 0.7 g/dL (ref 0.7–1.3)
Gamma Glob SerPl Elph-Mcnc: 0.1 g/dL — ABNORMAL LOW (ref 0.4–1.8)
Globulin, Total: 1.6 g/dL — ABNORMAL LOW (ref 2.2–3.9)
IgA: 21 mg/dL — ABNORMAL LOW (ref 61–437)
IgG (Immunoglobin G), Serum: 194 mg/dL — ABNORMAL LOW (ref 603–1613)
IgM (Immunoglobulin M), Srm: 16 mg/dL (ref 15–143)
Total Protein ELP: 5.1 g/dL — ABNORMAL LOW (ref 6.0–8.5)

## 2023-06-09 ENCOUNTER — Other Ambulatory Visit: Payer: Self-pay

## 2023-06-09 DIAGNOSIS — C9000 Multiple myeloma not having achieved remission: Secondary | ICD-10-CM

## 2023-06-10 ENCOUNTER — Other Ambulatory Visit: Payer: Self-pay | Admitting: Hematology

## 2023-06-10 ENCOUNTER — Inpatient Hospital Stay: Payer: Medicare Other

## 2023-06-10 ENCOUNTER — Ambulatory Visit: Payer: Medicare Other | Admitting: Hematology

## 2023-06-10 ENCOUNTER — Other Ambulatory Visit: Payer: Medicare Other

## 2023-06-10 VITALS — BP 119/71 | HR 78 | Temp 98.7°F | Resp 18 | Wt 193.8 lb

## 2023-06-10 DIAGNOSIS — C9000 Multiple myeloma not having achieved remission: Secondary | ICD-10-CM

## 2023-06-10 DIAGNOSIS — Z5112 Encounter for antineoplastic immunotherapy: Secondary | ICD-10-CM | POA: Diagnosis not present

## 2023-06-10 DIAGNOSIS — Z95828 Presence of other vascular implants and grafts: Secondary | ICD-10-CM

## 2023-06-10 DIAGNOSIS — Z7189 Other specified counseling: Secondary | ICD-10-CM

## 2023-06-10 LAB — CMP (CANCER CENTER ONLY)
ALT: 17 U/L (ref 0–44)
AST: 25 U/L (ref 15–41)
Albumin: 4 g/dL (ref 3.5–5.0)
Alkaline Phosphatase: 66 U/L (ref 38–126)
Anion gap: 5 (ref 5–15)
BUN: 21 mg/dL (ref 8–23)
CO2: 26 mmol/L (ref 22–32)
Calcium: 9.4 mg/dL (ref 8.9–10.3)
Chloride: 108 mmol/L (ref 98–111)
Creatinine: 1.61 mg/dL — ABNORMAL HIGH (ref 0.61–1.24)
GFR, Estimated: 44 mL/min — ABNORMAL LOW (ref >60)
Glucose, Bld: 137 mg/dL — ABNORMAL HIGH (ref 70–99)
Potassium: 4.2 mmol/L (ref 3.5–5.1)
Sodium: 139 mmol/L (ref 135–145)
Total Bilirubin: 0.8 mg/dL (ref <1.2)
Total Protein: 5.9 g/dL — ABNORMAL LOW (ref 6.5–8.1)

## 2023-06-10 LAB — CBC WITH DIFFERENTIAL (CANCER CENTER ONLY)
Abs Immature Granulocytes: 0.01 10*3/uL (ref 0.00–0.07)
Basophils Absolute: 0 10*3/uL (ref 0.0–0.1)
Basophils Relative: 0 %
Eosinophils Absolute: 0.1 10*3/uL (ref 0.0–0.5)
Eosinophils Relative: 3 %
HCT: 34.8 % — ABNORMAL LOW (ref 39.0–52.0)
Hemoglobin: 12 g/dL — ABNORMAL LOW (ref 13.0–17.0)
Immature Granulocytes: 0 %
Lymphocytes Relative: 23 %
Lymphs Abs: 1 10*3/uL (ref 0.7–4.0)
MCH: 37.2 pg — ABNORMAL HIGH (ref 26.0–34.0)
MCHC: 34.5 g/dL (ref 30.0–36.0)
MCV: 107.7 fL — ABNORMAL HIGH (ref 80.0–100.0)
Monocytes Absolute: 0.3 10*3/uL (ref 0.1–1.0)
Monocytes Relative: 7 %
Neutro Abs: 3.1 10*3/uL (ref 1.7–7.7)
Neutrophils Relative %: 67 %
Platelet Count: 181 10*3/uL (ref 150–400)
RBC: 3.23 MIL/uL — ABNORMAL LOW (ref 4.22–5.81)
RDW: 13.5 % (ref 11.5–15.5)
WBC Count: 4.6 10*3/uL (ref 4.0–10.5)
nRBC: 0 % (ref 0.0–0.2)

## 2023-06-10 MED ORDER — SODIUM CHLORIDE 0.9% FLUSH
10.0000 mL | INTRAVENOUS | Status: DC | PRN
  Administered 2023-06-10: 10 mL

## 2023-06-10 MED ORDER — SODIUM CHLORIDE 0.9 % IV SOLN: Freq: Once | INTRAVENOUS | Status: AC

## 2023-06-10 MED ORDER — ACETAMINOPHEN 500 MG PO TABS
1000.0000 mg | ORAL_TABLET | Freq: Once | ORAL | Status: AC
Start: 2023-06-10 — End: 2023-06-10
  Administered 2023-06-10: 1000 mg via ORAL
  Filled 2023-06-10: qty 2

## 2023-06-10 MED ORDER — DEXTROSE 5 % IV SOLN
56.0000 mg/m2 | Freq: Once | INTRAVENOUS | Status: AC
  Administered 2023-06-10: 120 mg via INTRAVENOUS
  Filled 2023-06-10: qty 60

## 2023-06-10 MED ORDER — SODIUM CHLORIDE 0.9% FLUSH
10.0000 mL | Freq: Once | INTRAVENOUS | Status: AC
  Administered 2023-06-10: 10 mL

## 2023-06-10 MED ORDER — DEXAMETHASONE SODIUM PHOSPHATE 10 MG/ML IJ SOLN
10.0000 mg | Freq: Once | INTRAMUSCULAR | Status: AC
  Administered 2023-06-10: 10 mg via INTRAVENOUS
  Filled 2023-06-10: qty 1

## 2023-06-10 MED ORDER — HEPARIN SOD (PORK) LOCK FLUSH 100 UNIT/ML IV SOLN
500.0000 [IU] | Freq: Once | INTRAVENOUS | Status: AC | PRN
  Administered 2023-06-10: 500 [IU]

## 2023-06-10 MED ORDER — PROCHLORPERAZINE MALEATE 10 MG PO TABS
10.0000 mg | ORAL_TABLET | Freq: Once | ORAL | Status: AC
  Administered 2023-06-10: 10 mg via ORAL
  Filled 2023-06-10: qty 1

## 2023-06-10 NOTE — Progress Notes (Signed)
Pt ok for tx with CR: 1.61

## 2023-06-10 NOTE — Patient Instructions (Signed)
 [facility] - A DEPT OF [facility]  Discharge Instructions: Thank you for choosing [facility] to provide your oncology and hematology care.   If you have a lab appointment with the Cancer Center, please go directly to the Cancer Center and check in at the registration area.   Wear comfortable clothing and clothing appropriate for easy access to any Portacath or PICC line.   We strive to give you quality time with your provider. You may need to reschedule your appointment if you arrive late (15 or more minutes).  Arriving late affects you and other patients whose appointments are after yours.  Also, if you miss three or more appointments without notifying the office, you may be dismissed from the clinic at the provider's discretion.      For prescription refill requests, have your pharmacy contact our office and allow 72 hours for refills to be completed.    Today you received the following chemotherapy and/or immunotherapy agents: Kyprolis      To help prevent nausea and vomiting after your treatment, we encourage you to take your nausea medication as directed.  BELOW ARE SYMPTOMS THAT SHOULD BE REPORTED IMMEDIATELY: *FEVER GREATER THAN 100.4 F (38 C) OR HIGHER *CHILLS OR SWEATING *NAUSEA AND VOMITING THAT IS NOT CONTROLLED WITH YOUR NAUSEA MEDICATION *UNUSUAL SHORTNESS OF BREATH *UNUSUAL BRUISING OR BLEEDING *URINARY PROBLEMS (pain or burning when urinating, or frequent urination) *BOWEL PROBLEMS (unusual diarrhea, constipation, pain near the anus) TENDERNESS IN MOUTH AND THROAT WITH OR WITHOUT PRESENCE OF ULCERS (sore throat, sores in mouth, or a toothache) UNUSUAL RASH, SWELLING OR PAIN  UNUSUAL VAGINAL DISCHARGE OR ITCHING   Items with * indicate a potential emergency and should be followed up as soon as possible or go to the Emergency Department if any problems should occur.  Please show the CHEMOTHERAPY ALERT CARD or IMMUNOTHERAPY  ALERT CARD at check-in to the Emergency Department and triage nurse.  Should you have questions after your visit or need to cancel or reschedule your appointment, please contact [facility] - A DEPT OF [facility]  Dept: [phone]  and follow the prompts.  Office hours are 8:00 a.m. to 4:30 p.m. Monday - Friday. Please note that voicemails left after 4:00 p.m. may not be returned until the following business day.  We are closed weekends and major holidays. You have access to a nurse at all times for urgent questions. Please call the main number to the clinic Dept: [phone] and follow the prompts.   For any non-urgent questions, you may also contact your provider using MyChart. We now offer e-Visits for anyone 38 and older to request care online for non-urgent symptoms. For details visit [email or web address].   Also download the MyChart app! Go to the app store, search "MyChart", open the app, select [place], and log in with your MyChart username and password.

## 2023-06-11 ENCOUNTER — Other Ambulatory Visit: Payer: Self-pay

## 2023-06-11 ENCOUNTER — Encounter: Payer: Self-pay | Admitting: Hematology

## 2023-06-11 DIAGNOSIS — C9000 Multiple myeloma not having achieved remission: Secondary | ICD-10-CM

## 2023-06-11 MED ORDER — ERGOCALCIFEROL 1.25 MG (50000 UT) PO CAPS: 50000.0000 [IU] | ORAL_CAPSULE | ORAL | 3 refills | Status: DC

## 2023-06-23 ENCOUNTER — Encounter: Payer: Self-pay | Admitting: Hematology

## 2023-06-23 ENCOUNTER — Other Ambulatory Visit: Payer: Self-pay

## 2023-06-23 DIAGNOSIS — C9000 Multiple myeloma not having achieved remission: Secondary | ICD-10-CM

## 2023-06-23 MED ORDER — ONDANSETRON HCL 8 MG PO TABS: 8.0000 mg | ORAL_TABLET | Freq: Three times a day (TID) | ORAL | 0 refills | Status: DC | PRN

## 2023-06-24 ENCOUNTER — Other Ambulatory Visit: Payer: Self-pay

## 2023-06-24 DIAGNOSIS — C9000 Multiple myeloma not having achieved remission: Secondary | ICD-10-CM

## 2023-06-25 ENCOUNTER — Inpatient Hospital Stay: Payer: Medicare Other

## 2023-06-25 ENCOUNTER — Inpatient Hospital Stay: Payer: Medicare Other | Attending: Hematology

## 2023-06-25 ENCOUNTER — Telehealth: Payer: Self-pay | Admitting: Internal Medicine

## 2023-06-25 VITALS — BP 120/82 | HR 89 | Temp 98.9°F | Resp 16 | Wt 195.1 lb

## 2023-06-25 DIAGNOSIS — C9001 Multiple myeloma in remission: Secondary | ICD-10-CM | POA: Diagnosis not present

## 2023-06-25 DIAGNOSIS — Z5112 Encounter for antineoplastic immunotherapy: Secondary | ICD-10-CM | POA: Diagnosis present

## 2023-06-25 DIAGNOSIS — Z7189 Other specified counseling: Secondary | ICD-10-CM

## 2023-06-25 DIAGNOSIS — C9000 Multiple myeloma not having achieved remission: Secondary | ICD-10-CM

## 2023-06-25 DIAGNOSIS — Z95828 Presence of other vascular implants and grafts: Secondary | ICD-10-CM

## 2023-06-25 LAB — CBC WITH DIFFERENTIAL (CANCER CENTER ONLY)
Abs Immature Granulocytes: 0.01 10*3/uL (ref 0.00–0.07)
Basophils Absolute: 0 10*3/uL (ref 0.0–0.1)
Basophils Relative: 1 %
Eosinophils Absolute: 0.1 10*3/uL (ref 0.0–0.5)
Eosinophils Relative: 3 %
HCT: 35.3 % — ABNORMAL LOW (ref 39.0–52.0)
Hemoglobin: 12 g/dL — ABNORMAL LOW (ref 13.0–17.0)
Immature Granulocytes: 0 %
Lymphocytes Relative: 18 %
Lymphs Abs: 0.9 10*3/uL (ref 0.7–4.0)
MCH: 36.4 pg — ABNORMAL HIGH (ref 26.0–34.0)
MCHC: 34 g/dL (ref 30.0–36.0)
MCV: 107 fL — ABNORMAL HIGH (ref 80.0–100.0)
Monocytes Absolute: 0.4 10*3/uL (ref 0.1–1.0)
Monocytes Relative: 8 %
Neutro Abs: 3.6 10*3/uL (ref 1.7–7.7)
Neutrophils Relative %: 70 %
Platelet Count: 172 10*3/uL (ref 150–400)
RBC: 3.3 MIL/uL — ABNORMAL LOW (ref 4.22–5.81)
RDW: 13.2 % (ref 11.5–15.5)
WBC Count: 5.1 10*3/uL (ref 4.0–10.5)
nRBC: 0 % (ref 0.0–0.2)

## 2023-06-25 LAB — CMP (CANCER CENTER ONLY)
ALT: 15 U/L (ref 0–44)
AST: 23 U/L (ref 15–41)
Albumin: 3.9 g/dL (ref 3.5–5.0)
Alkaline Phosphatase: 61 U/L (ref 38–126)
Anion gap: 5 (ref 5–15)
BUN: 18 mg/dL (ref 8–23)
CO2: 27 mmol/L (ref 22–32)
Calcium: 9.5 mg/dL (ref 8.9–10.3)
Chloride: 107 mmol/L (ref 98–111)
Creatinine: 1.5 mg/dL — ABNORMAL HIGH (ref 0.61–1.24)
GFR, Estimated: 48 mL/min — ABNORMAL LOW (ref >60)
Glucose, Bld: 159 mg/dL — ABNORMAL HIGH (ref 70–99)
Potassium: 4.1 mmol/L (ref 3.5–5.1)
Sodium: 139 mmol/L (ref 135–145)
Total Bilirubin: 0.8 mg/dL (ref <1.2)
Total Protein: 5.7 g/dL — ABNORMAL LOW (ref 6.5–8.1)

## 2023-06-25 MED ORDER — DEXAMETHASONE SODIUM PHOSPHATE 10 MG/ML IJ SOLN
10.0000 mg | Freq: Once | INTRAMUSCULAR | Status: AC
  Administered 2023-06-25: 10 mg via INTRAVENOUS
  Filled 2023-06-25: qty 1

## 2023-06-25 MED ORDER — ACETAMINOPHEN 500 MG PO TABS
1000.0000 mg | ORAL_TABLET | Freq: Once | ORAL | Status: AC
  Administered 2023-06-25: 1000 mg via ORAL
  Filled 2023-06-25: qty 2

## 2023-06-25 MED ORDER — HEPARIN SOD (PORK) LOCK FLUSH 100 UNIT/ML IV SOLN
500.0000 [IU] | Freq: Once | INTRAVENOUS | Status: AC | PRN
  Administered 2023-06-25: 500 [IU]

## 2023-06-25 MED ORDER — SODIUM CHLORIDE 0.9% FLUSH
10.0000 mL | INTRAVENOUS | Status: DC | PRN
  Administered 2023-06-25: 10 mL

## 2023-06-25 MED ORDER — SODIUM CHLORIDE 0.9 % IV SOLN: Freq: Once | INTRAVENOUS | Status: AC

## 2023-06-25 MED ORDER — SODIUM CHLORIDE 0.9% FLUSH
10.0000 mL | Freq: Once | INTRAVENOUS | Status: AC
  Administered 2023-06-25: 10 mL

## 2023-06-25 MED ORDER — PROCHLORPERAZINE MALEATE 10 MG PO TABS
10.0000 mg | ORAL_TABLET | Freq: Once | ORAL | Status: AC
  Administered 2023-06-25: 10 mg via ORAL
  Filled 2023-06-25: qty 1

## 2023-06-25 MED ORDER — DEXTROSE 5 % IV SOLN
56.0000 mg/m2 | Freq: Once | INTRAVENOUS | Status: AC
  Administered 2023-06-25: 120 mg via INTRAVENOUS
  Filled 2023-06-25: qty 60

## 2023-06-25 NOTE — Patient Instructions (Signed)
[facility] - [facility]  Discharge Instructions: Thank you for choosing [facility] to provide your oncology and hematology care.   If you have a lab appointment with the Cancer Center, please go directly to the Cancer Center and check in at the registration area.   Wear comfortable clothing and clothing appropriate for easy access to any Portacath or PICC line.   We strive to give you quality time with your provider. You may need to reschedule your appointment if you arrive late (15 or more minutes).  Arriving late affects you and other patients whose appointments are after yours.  Also, if you miss three or more appointments without notifying the office, you may be dismissed from the clinic at the provider's discretion.      For prescription refill requests, have your pharmacy contact our office and allow 72 hours for refills to be completed.    Today you received the following chemotherapy and/or immunotherapy agents: Kyprolis      To help prevent nausea and vomiting after your treatment, we encourage you to take your nausea medication as directed.  BELOW ARE SYMPTOMS THAT SHOULD BE REPORTED IMMEDIATELY: *FEVER GREATER THAN 100.4 F (38 C) OR HIGHER *CHILLS OR SWEATING *NAUSEA AND VOMITING THAT IS NOT CONTROLLED WITH YOUR NAUSEA MEDICATION *UNUSUAL SHORTNESS OF BREATH *UNUSUAL BRUISING OR BLEEDING *URINARY PROBLEMS (pain or burning when urinating, or frequent urination) *BOWEL PROBLEMS (unusual diarrhea, constipation, pain near the anus) TENDERNESS IN MOUTH AND THROAT WITH OR WITHOUT PRESENCE OF ULCERS (sore throat, sores in mouth, or a toothache) UNUSUAL RASH, SWELLING OR PAIN  UNUSUAL VAGINAL DISCHARGE OR ITCHING   Items with * indicate a potential emergency and should be followed up as soon as possible or go to the Emergency Department if any problems should occur.  Please show the CHEMOTHERAPY ALERT CARD or IMMUNOTHERAPY  ALERT CARD at check-in to the Emergency Department and triage nurse.  Should you have questions after your visit or need to cancel or reschedule your appointment, please contact [facility]  Dept: [phone]  and follow the prompts.  Office hours are 8:00 a.m. to 4:30 p.m. Monday - Friday. Please note that voicemails left after 4:00 p.m. may not be returned until the following business day.  We are closed weekends and major holidays. You have access to a nurse at all times for urgent questions. Please call the main number to the clinic Dept: [phone] and follow the prompts.   For any non-urgent questions, you may also contact your provider using MyChart. We now offer e-Visits for anyone 73 and older to request care online for non-urgent symptoms. For details visit [email or web address].   Also download the MyChart app! Go to the app store, search "MyChart", open the app, select [place], and log in with your MyChart username and password.

## 2023-06-25 NOTE — Telephone Encounter (Signed)
Tried to call [name] to schedule an Annual Medicare Wellness Visit that would be a phone visit with clinic staff for 07/08/2023 at 3:30

## 2023-06-27 NOTE — Telephone Encounter (Signed)
LVM that I was scheduling 07/07/2023 at 3:00

## 2023-06-27 NOTE — Telephone Encounter (Signed)
scheduled

## 2023-07-01 LAB — MULTIPLE MYELOMA PANEL, SERUM
Albumin SerPl Elph-Mcnc: 3.4 g/dL (ref 2.9–4.4)
Albumin/Glob SerPl: 1.8 — ABNORMAL HIGH (ref 0.7–1.7)
Alpha 1: 0.3 g/dL (ref 0.0–0.4)
Alpha2 Glob SerPl Elph-Mcnc: 0.7 g/dL (ref 0.4–1.0)
B-Globulin SerPl Elph-Mcnc: 0.9 g/dL (ref 0.7–1.3)
Gamma Glob SerPl Elph-Mcnc: 0.2 g/dL — ABNORMAL LOW (ref 0.4–1.8)
Globulin, Total: 2 g/dL — ABNORMAL LOW (ref 2.2–3.9)
IgA: 20 mg/dL — ABNORMAL LOW (ref 61–437)
IgG (Immunoglobin G), Serum: 200 mg/dL — ABNORMAL LOW (ref 603–1613)
IgM (Immunoglobulin M), Srm: 8 mg/dL — ABNORMAL LOW (ref 15–143)
Total Protein ELP: 5.4 g/dL — ABNORMAL LOW (ref 6.0–8.5)

## 2023-07-02 ENCOUNTER — Other Ambulatory Visit: Payer: Self-pay
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
## 2023-07-04 ENCOUNTER — Other Ambulatory Visit: Payer: Self-pay

## 2023-07-04 ENCOUNTER — Encounter: Payer: Self-pay | Admitting: Hematology

## 2023-07-04 DIAGNOSIS — C9001 Multiple myeloma in remission: Secondary | ICD-10-CM

## 2023-07-04 MED ORDER — TRAMADOL HCL 50 MG PO TABS: 50.0000 mg | ORAL_TABLET | Freq: Four times a day (QID) | ORAL | 0 refills | Status: DC | PRN

## 2023-07-07 ENCOUNTER — Ambulatory Visit (INDEPENDENT_AMBULATORY_CARE_PROVIDER_SITE_OTHER): Payer: Medicare Other

## 2023-07-07 ENCOUNTER — Other Ambulatory Visit: Payer: Self-pay

## 2023-07-07 VITALS — Ht 70.0 in | Wt 196.0 lb

## 2023-07-07 DIAGNOSIS — Z0000 Encounter for general adult medical examination without abnormal findings: Secondary | ICD-10-CM

## 2023-07-07 DIAGNOSIS — C9000 Multiple myeloma not having achieved remission: Secondary | ICD-10-CM

## 2023-07-07 NOTE — Patient Instructions (Signed)
Next appointment: Follow up in one year for your annual wellness visit.  Preventive Care 2 Years and Older, Male Preventive care refers to lifestyle choices and visits with your health care provider that can promote health and wellness. What does preventive care include? A yearly physical exam. This is also called an annual well check. Dental exams once or twice a year. Routine eye exams. Ask your health care provider how often you should have your eyes checked. Personal lifestyle choices, including: Daily care of your teeth and gums. Regular physical activity. Eating a healthy diet. Avoiding tobacco and drug use. Limiting alcohol use. Practicing safe sex. Taking low doses of aspirin every day. Taking vitamin and mineral supplements as recommended by your health care provider. What happens during an annual well check? The services and screenings done by your health care provider during your annual well check will depend on your age, overall health, lifestyle risk factors, and family history of disease. Counseling  Your health care provider may ask you questions about your: Alcohol use. Tobacco use. Drug use. Emotional well-being. Home and relationship well-being. Sexual activity. Eating habits. History of falls. Memory and ability to understand (cognition). Work and work Astronomer. Screening  You may have the following tests or measurements: Height, weight, and BMI. Blood pressure. Lipid and cholesterol levels. These may be checked every 5 years, or more frequently if you are over 48 years old. Skin check. Lung cancer screening. You may have this screening every year starting at age 16 if you have a 30-pack-year history of smoking and currently smoke or have quit within the past 15 years. Fecal occult blood test (FOBT) of the stool. You may have this test every year starting at age 13. Flexible sigmoidoscopy or colonoscopy. You may have a sigmoidoscopy every 5 years or a  colonoscopy every 10 years starting at age 54. Prostate cancer screening. Recommendations will vary depending on your family history and other risks. Hepatitis C blood test. Hepatitis B blood test. Sexually transmitted disease (STD) testing. Diabetes screening. This is done by checking your blood sugar (glucose) after you have not eaten for a while (fasting). You may have this done every 1-3 years. Abdominal aortic aneurysm (AAA) screening. You may need this if you are a current or former smoker. Osteoporosis. You may be screened starting at age 64 if you are at high risk. Talk with your health care provider about your test results, treatment options, and if necessary, the need for more tests. Vaccines  Your health care provider may recommend certain vaccines, such as: Influenza vaccine. This is recommended every year. Tetanus, diphtheria, and acellular pertussis (Tdap, Td) vaccine. You may need a Td booster every 10 years. Zoster vaccine. You may need this after age 10. Pneumococcal 13-valent conjugate (PCV13) vaccine. One dose is recommended after age 63. Pneumococcal polysaccharide (PPSV23) vaccine. One dose is recommended after age 65. Talk to your health care provider about which screenings and vaccines you need and how often you need them. This information is not intended to replace advice given to you by your health care provider. Make sure you discuss any questions you have with your health care provider. Document Released: 08/04/2015 Document Revised: 03/27/2016 Document Reviewed: 05/09/2015 Elsevier Interactive Patient Education  2017 [employer].  Fall Prevention in the Home Falls can cause injuries. They can happen to people of all ages. There are many things you can do to make your home safe and to help prevent falls. What can I do on  the outside of my home? Regularly fix the edges of walkways and driveways and fix any cracks. Remove anything that might make you trip as you  walk through a door, such as a raised step or threshold. Trim any bushes or trees on the path to your home. Use bright outdoor lighting. Clear any walking paths of anything that might make someone trip, such as rocks or tools. Regularly check to see if handrails are loose or broken. Make sure that both sides of any steps have handrails. Any raised decks and porches should have guardrails on the edges. Have any leaves, snow, or ice cleared regularly. Use sand or salt on walking paths during winter. Clean up any spills in your garage right away. This includes oil or grease spills. What can I do in the bathroom? Use night lights. Install grab bars by the toilet and in the tub and shower. Do not use towel bars as grab bars. Use non-skid mats or decals in the tub or shower. If you need to sit down in the shower, use a plastic, non-slip stool. Keep the floor dry. Clean up any water that spills on the floor as soon as it happens. Remove soap buildup in the tub or shower regularly. Attach bath mats securely with double-sided non-slip rug tape. Do not have throw rugs and other things on the floor that can make you trip. What can I do in the bedroom? Use night lights. Make sure that you have a light by your bed that is easy to reach. Do not use any sheets or blankets that are too big for your bed. They should not hang down onto the floor. Have a firm chair that has side arms. You can use this for support while you get dressed. Do not have throw rugs and other things on the floor that can make you trip. What can I do in the kitchen? Clean up any spills right away. Avoid walking on wet floors. Keep items that you use a lot in easy-to-reach places. If you need to reach something above you, use a strong step stool that has a grab bar. Keep electrical cords out of the way. Do not use floor polish or wax that makes floors slippery. If you must use wax, use non-skid floor wax. Do not have throw rugs  and other things on the floor that can make you trip. What can I do with my stairs? Do not leave any items on the stairs. Make sure that there are handrails on both sides of the stairs and use them. Fix handrails that are broken or loose. Make sure that handrails are as long as the stairways. Check any carpeting to make sure that it is firmly attached to the stairs. Fix any carpet that is loose or worn. Avoid having throw rugs at the top or bottom of the stairs. If you do have throw rugs, attach them to the floor with carpet tape. Make sure that you have a light switch at the top of the stairs and the bottom of the stairs. If you do not have them, ask someone to add them for you. What else can I do to help prevent falls? Wear shoes that: Do not have high heels. Have rubber bottoms. Are comfortable and fit you well. Are closed at the toe. Do not wear sandals. If you use a stepladder: Make sure that it is fully opened. Do not climb a closed stepladder. Make sure that both sides of the stepladder are locked  into place. Ask someone to hold it for you, if possible. Clearly mark and make sure that you can see: Any grab bars or handrails. First and last steps. Where the edge of each step is. Use tools that help you move around (mobility aids) if they are needed. These include: Canes. Walkers. Scooters. Crutches. Turn on the lights when you go into a dark area. Replace any light bulbs as soon as they burn out. Set up your furniture so you have a clear path. Avoid moving your furniture around. If any of your floors are uneven, fix them. If there are any pets around you, be aware of where they are. Review your medicines with your doctor. Some medicines can make you feel dizzy. This can increase your chance of falling. Ask your doctor what other things that you can do to help prevent falls. This information is not intended to replace advice given to you by your health care provider. Make sure  you discuss any questions you have with your health care provider. Document Released: 05/04/2009 Document Revised: 12/14/2015 Document Reviewed: 08/12/2014 Elsevier Interactive Patient Education  2017 [employer].
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
## 2023-07-08 ENCOUNTER — Inpatient Hospital Stay: Payer: Medicare Other

## 2023-07-08 ENCOUNTER — Inpatient Hospital Stay: Payer: Medicare Other | Admitting: Hematology

## 2023-07-08 VITALS — BP 116/78 | HR 80 | Temp 98.1°F | Resp 18 | Ht 70.0 in | Wt 193.6 lb

## 2023-07-08 DIAGNOSIS — Z5111 Encounter for antineoplastic chemotherapy: Secondary | ICD-10-CM | POA: Diagnosis not present

## 2023-07-08 DIAGNOSIS — C9000 Multiple myeloma not having achieved remission: Secondary | ICD-10-CM

## 2023-07-08 DIAGNOSIS — Z95828 Presence of other vascular implants and grafts: Secondary | ICD-10-CM

## 2023-07-08 DIAGNOSIS — Z7189 Other specified counseling: Secondary | ICD-10-CM

## 2023-07-08 DIAGNOSIS — C9001 Multiple myeloma in remission: Secondary | ICD-10-CM | POA: Diagnosis not present

## 2023-07-08 DIAGNOSIS — Z5112 Encounter for antineoplastic immunotherapy: Secondary | ICD-10-CM | POA: Diagnosis not present

## 2023-07-08 LAB — CMP (CANCER CENTER ONLY)
ALT: 15 U/L (ref 0–44)
AST: 25 U/L (ref 15–41)
Albumin: 3.9 g/dL (ref 3.5–5.0)
Alkaline Phosphatase: 57 U/L (ref 38–126)
Anion gap: 6 (ref 5–15)
BUN: 21 mg/dL (ref 8–23)
CO2: 26 mmol/L (ref 22–32)
Calcium: 9.4 mg/dL (ref 8.9–10.3)
Chloride: 107 mmol/L (ref 98–111)
Creatinine: 1.48 mg/dL — ABNORMAL HIGH (ref 0.61–1.24)
GFR, Estimated: 49 mL/min — ABNORMAL LOW (ref >60)
Glucose, Bld: 127 mg/dL — ABNORMAL HIGH (ref 70–99)
Potassium: 4.2 mmol/L (ref 3.5–5.1)
Sodium: 139 mmol/L (ref 135–145)
Total Bilirubin: 0.7 mg/dL (ref <1.2)
Total Protein: 5.6 g/dL — ABNORMAL LOW (ref 6.5–8.1)

## 2023-07-08 LAB — CBC WITH DIFFERENTIAL (CANCER CENTER ONLY)
Abs Immature Granulocytes: 0.01 10*3/uL (ref 0.00–0.07)
Basophils Absolute: 0 10*3/uL (ref 0.0–0.1)
Basophils Relative: 1 %
Eosinophils Absolute: 0.2 10*3/uL (ref 0.0–0.5)
Eosinophils Relative: 3 %
HCT: 34.9 % — ABNORMAL LOW (ref 39.0–52.0)
Hemoglobin: 11.7 g/dL — ABNORMAL LOW (ref 13.0–17.0)
Immature Granulocytes: 0 %
Lymphocytes Relative: 20 %
Lymphs Abs: 1 10*3/uL (ref 0.7–4.0)
MCH: 35.8 pg — ABNORMAL HIGH (ref 26.0–34.0)
MCHC: 33.5 g/dL (ref 30.0–36.0)
MCV: 106.7 fL — ABNORMAL HIGH (ref 80.0–100.0)
Monocytes Absolute: 0.4 10*3/uL (ref 0.1–1.0)
Monocytes Relative: 7 %
Neutro Abs: 3.4 10*3/uL (ref 1.7–7.7)
Neutrophils Relative %: 69 %
Platelet Count: 156 10*3/uL (ref 150–400)
RBC: 3.27 MIL/uL — ABNORMAL LOW (ref 4.22–5.81)
RDW: 13.2 % (ref 11.5–15.5)
WBC Count: 5 10*3/uL (ref 4.0–10.5)
nRBC: 0 % (ref 0.0–0.2)

## 2023-07-08 MED ORDER — DEXTROSE 5 % IV SOLN
56.0000 mg/m2 | Freq: Once | INTRAVENOUS | Status: AC
  Administered 2023-07-08: 120 mg via INTRAVENOUS
  Filled 2023-07-08: qty 60

## 2023-07-08 MED ORDER — SODIUM CHLORIDE 0.9 % IV SOLN: Freq: Once | INTRAVENOUS | Status: AC

## 2023-07-08 MED ORDER — DEXAMETHASONE SODIUM PHOSPHATE 10 MG/ML IJ SOLN
10.0000 mg | Freq: Once | INTRAMUSCULAR | Status: AC
  Administered 2023-07-08: 10 mg via INTRAVENOUS
  Filled 2023-07-08: qty 1

## 2023-07-08 MED ORDER — PROCHLORPERAZINE MALEATE 10 MG PO TABS
10.0000 mg | ORAL_TABLET | Freq: Once | ORAL | Status: AC
  Administered 2023-07-08: 10 mg via ORAL
  Filled 2023-07-08: qty 1

## 2023-07-08 MED ORDER — SODIUM CHLORIDE 0.9% FLUSH
10.0000 mL | Freq: Once | INTRAVENOUS | Status: AC
  Administered 2023-07-08: 10 mL

## 2023-07-08 MED ORDER — ACETAMINOPHEN 500 MG PO TABS
1000.0000 mg | ORAL_TABLET | Freq: Once | ORAL | Status: AC
  Administered 2023-07-08: 1000 mg via ORAL
  Filled 2023-07-08: qty 2

## 2023-07-08 NOTE — Patient Instructions (Signed)
 [facility] DE[facility] OF [facility]  Discharge Instructions: Thank you for choosing [facility] to provide your oncology and hematology care.   If you have a lab appointment with the Cancer Center, please go directly to the Cancer Center and check in at the registration area.   Wear comfortable clothing and clothing appropriate for easy access to any Portacath or PICC line.   We strive to give you quality time with your provider. You may need to reschedule your appointment if you arrive late (15 or more minutes).  Arriving late affects you and other patients whose appointments are after yours.  Also, if you miss three or more appointments without notifying the office, you may be dismissed from the clinic at the provider's discretion.      For prescription refill requests, have your pharmacy contact our office and allow 72 hours for refills to be completed.    Today you received the following chemotherapy and/or immunotherapy agents: Kyprolis      To help prevent nausea and vomiting after your treatment, we encourage you to take your nausea medication as directed.  BELOW ARE SYMPTOMS THAT SHOULD BE REPORTED IMMEDIATELY: *FEVER GREATER THAN 100.4 F (38 C) OR HIGHER *CHILLS OR SWEATING *NAUSEA AND VOMITING THAT IS NOT CONTROLLED WITH YOUR NAUSEA MEDICATION *UNUSUAL SHORTNESS OF BREATH *UNUSUAL BRUISING OR BLEEDING *URINARY PROBLEMS (pain or burning when urinating, or frequent urination) *BOWEL PROBLEMS (unusual diarrhea, constipation, pain near the anus) TENDERNESS IN MOUTH AND THROAT WITH OR WITHOUT PRESENCE OF ULCERS (sore throat, sores in mouth, or a toothache) UNUSUAL RASH, SWELLING OR PAIN  UNUSUAL VAGINAL DISCHARGE OR ITCHING   Items with * indicate a potential emergency and should be followed up as soon as possible or go to the Emergency Department if any problems should occur.  Please show the CHEMOTHERAPY ALERT CARD or IMMUNOTHERAPY  ALERT CARD at check-in to the Emergency Department and triage nurse.  Should you have questions after your visit or need to cancel or reschedule your appointment, please contact [facility] - A DE[facility]  Dept: [phone]  and follow the prompts.  Office hours are 8:00 a.m. to 4:30 p.m. Monday - Friday. Please note that voicemails left after 4:00 p.m. may not be returned until the following business day.  We are closed weekends and major holidays. You have access to a nurse at all times for urgent questions. Please call the main number to the clinic Dept: [phone] and follow the prompts.   For any non-urgent questions, you may also contact your provider using MyChart. We now offer e-Visits for anyone 73 and older to request care online for non-urgent symptoms. For details visit [email or web address].   Also download the MyChart app! Go to the app store, search "MyChart", open the app, select [place], and log in with your MyChart username and password.
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
## 2023-07-08 NOTE — Progress Notes (Signed)
Patient seen by Dr. Addison Naegeli are within treatment parameters.  Labs reviewed: and are not all within treatment parameters.   Dr Candise Che aware CR: 1.48  Per physician team, patient is ready for treatment and there are NO modifications to the treatment plan.

## 2023-07-10 ENCOUNTER — Other Ambulatory Visit: Payer: Medicare Other

## 2023-07-10 ENCOUNTER — Encounter: Payer: Self-pay | Admitting: Hematology

## 2023-07-10 ENCOUNTER — Other Ambulatory Visit: Payer: Self-pay

## 2023-07-10 DIAGNOSIS — N401 Enlarged prostate with lower urinary tract symptoms: Secondary | ICD-10-CM

## 2023-07-10 DIAGNOSIS — Z0000 Encounter for general adult medical examination without abnormal findings: Secondary | ICD-10-CM

## 2023-07-10 DIAGNOSIS — C9000 Multiple myeloma not having achieved remission: Secondary | ICD-10-CM

## 2023-07-10 DIAGNOSIS — Z13220 Encounter for screening for lipoid disorders: Secondary | ICD-10-CM

## 2023-07-10 DIAGNOSIS — Z7901 Long term (current) use of anticoagulants: Secondary | ICD-10-CM

## 2023-07-10 DIAGNOSIS — N183 Chronic kidney disease, stage 3 unspecified: Secondary | ICD-10-CM

## 2023-07-11 ENCOUNTER — Encounter: Payer: Self-pay | Admitting: Hematology

## 2023-07-11 ENCOUNTER — Encounter: Payer: Self-pay | Admitting: Internal Medicine

## 2023-07-11 ENCOUNTER — Other Ambulatory Visit: Payer: Self-pay

## 2023-07-11 ENCOUNTER — Ambulatory Visit: Payer: Medicare Other | Admitting: Internal Medicine

## 2023-07-11 VITALS — BP 128/80 | HR 92 | Ht 70.0 in | Wt 195.0 lb

## 2023-07-11 DIAGNOSIS — Z7901 Long term (current) use of anticoagulants: Secondary | ICD-10-CM

## 2023-07-11 DIAGNOSIS — N183 Chronic kidney disease, stage 3 unspecified: Secondary | ICD-10-CM | POA: Diagnosis not present

## 2023-07-11 DIAGNOSIS — G620 Drug-induced polyneuropathy: Secondary | ICD-10-CM | POA: Diagnosis not present

## 2023-07-11 DIAGNOSIS — N401 Enlarged prostate with lower urinary tract symptoms: Secondary | ICD-10-CM

## 2023-07-11 DIAGNOSIS — Z8579 Personal history of other malignant neoplasms of lymphoid, hematopoietic and related tissues: Secondary | ICD-10-CM

## 2023-07-11 DIAGNOSIS — G4733 Obstructive sleep apnea (adult) (pediatric): Secondary | ICD-10-CM

## 2023-07-11 DIAGNOSIS — Z0000 Encounter for general adult medical examination without abnormal findings: Secondary | ICD-10-CM | POA: Diagnosis not present

## 2023-07-11 DIAGNOSIS — Z8659 Personal history of other mental and behavioral disorders: Secondary | ICD-10-CM

## 2023-07-11 DIAGNOSIS — Z9484 Stem cells transplant status: Secondary | ICD-10-CM

## 2023-07-11 DIAGNOSIS — R351 Nocturia: Secondary | ICD-10-CM

## 2023-07-11 DIAGNOSIS — C7951 Secondary malignant neoplasm of bone: Secondary | ICD-10-CM

## 2023-07-11 DIAGNOSIS — Z86718 Personal history of other venous thrombosis and embolism: Secondary | ICD-10-CM

## 2023-07-11 LAB — CBC WITH DIFFERENTIAL/PLATELET
Absolute Lymphocytes: 1044 {cells}/uL (ref 850–3900)
Absolute Monocytes: 662 {cells}/uL (ref 200–950)
Basophils Absolute: 9 {cells}/uL (ref 0–200)
Basophils Relative: 0.2 %
Eosinophils Absolute: 81 {cells}/uL (ref 15–500)
Eosinophils Relative: 1.8 %
HCT: 34.3 % — ABNORMAL LOW (ref 38.5–50.0)
Hemoglobin: 11.6 g/dL — ABNORMAL LOW (ref 13.2–17.1)
MCH: 36 pg — ABNORMAL HIGH (ref 27.0–33.0)
MCHC: 33.8 g/dL (ref 32.0–36.0)
MCV: 106.5 fL — ABNORMAL HIGH (ref 80.0–100.0)
MPV: 10.5 fL (ref 7.5–12.5)
Monocytes Relative: 14.7 %
Neutro Abs: 2705 {cells}/uL (ref 1500–7800)
Neutrophils Relative %: 60.1 %
Platelets: 123 10*3/uL — ABNORMAL LOW (ref 140–400)
RBC: 3.22 10*6/uL — ABNORMAL LOW (ref 4.20–5.80)
RDW: 12.1 % (ref 11.0–15.0)
Total Lymphocyte: 23.2 %
WBC: 4.5 10*3/uL (ref 3.8–10.8)

## 2023-07-11 LAB — POCT URINALYSIS DIP (CLINITEK)
Bilirubin, UA: NEGATIVE
Blood, UA: NEGATIVE
Glucose, UA: NEGATIVE mg/dL
Ketones, POC UA: NEGATIVE mg/dL
Leukocytes, UA: NEGATIVE
Nitrite, UA: NEGATIVE
POC PROTEIN,UA: NEGATIVE
Spec Grav, UA: 1.01 (ref 1.010–1.025)
Urobilinogen, UA: 0.2 U/dL
pH, UA: 7 (ref 5.0–8.0)

## 2023-07-11 LAB — COMPLETE METABOLIC PANEL WITHOUT GFR
AG Ratio: 2.9 (calc) — ABNORMAL HIGH (ref 1.0–2.5)
ALT: 23 U/L (ref 9–46)
AST: 29 U/L (ref 10–35)
Albumin: 4 g/dL (ref 3.6–5.1)
Alkaline phosphatase (APISO): 65 U/L (ref 35–144)
BUN/Creatinine Ratio: 13 (calc) (ref 6–22)
BUN: 25 mg/dL (ref 7–25)
CO2: 27 mmol/L (ref 20–32)
Calcium: 9.7 mg/dL (ref 8.6–10.3)
Chloride: 105 mmol/L (ref 98–110)
Creat: 1.92 mg/dL — ABNORMAL HIGH (ref 0.70–1.28)
Globulin: 1.4 g/dL — ABNORMAL LOW (ref 1.9–3.7)
Glucose, Bld: 83 mg/dL (ref 65–99)
Potassium: 4.7 mmol/L (ref 3.5–5.3)
Sodium: 140 mmol/L (ref 135–146)
Total Bilirubin: 0.5 mg/dL (ref 0.2–1.2)
Total Protein: 5.4 g/dL — ABNORMAL LOW (ref 6.1–8.1)
eGFR: 36 mL/min/1.73m2 — ABNORMAL LOW

## 2023-07-11 LAB — PSA: PSA: 0.45 ng/mL

## 2023-07-11 LAB — LIPID PANEL
Cholesterol: 174 mg/dL
HDL: 69 mg/dL
LDL Cholesterol (Calc): 86 mg/dL
Non-HDL Cholesterol (Calc): 105 mg/dL
Total CHOL/HDL Ratio: 2.5 (calc)
Triglycerides: 99 mg/dL

## 2023-07-11 MED ORDER — FENTANYL 12 MCG/HR TD PT72: 1.0000 | MEDICATED_PATCH | TRANSDERMAL | 0 refills | Status: DC

## 2023-07-15 ENCOUNTER — Encounter: Payer: Self-pay | Admitting: Hematology
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
## 2023-07-21 ENCOUNTER — Other Ambulatory Visit: Payer: Self-pay

## 2023-07-21 DIAGNOSIS — C9000 Multiple myeloma not having achieved remission: Secondary | ICD-10-CM

## 2023-07-21 NOTE — Patient Instructions (Addendum)
It was a pleasure to see you today and to see that you are doing well.  Continue current medications and return in 1 year or as needed.

## 2023-07-22 ENCOUNTER — Inpatient Hospital Stay: Payer: Medicare Other

## 2023-07-22 VITALS — BP 131/80 | HR 70 | Temp 97.9°F | Resp 14 | Wt 199.0 lb

## 2023-07-22 DIAGNOSIS — C9000 Multiple myeloma not having achieved remission: Secondary | ICD-10-CM

## 2023-07-22 DIAGNOSIS — Z95828 Presence of other vascular implants and grafts: Secondary | ICD-10-CM

## 2023-07-22 DIAGNOSIS — Z5112 Encounter for antineoplastic immunotherapy: Secondary | ICD-10-CM | POA: Diagnosis not present

## 2023-07-22 DIAGNOSIS — Z7189 Other specified counseling: Secondary | ICD-10-CM

## 2023-07-22 LAB — CBC WITH DIFFERENTIAL (CANCER CENTER ONLY)
Abs Immature Granulocytes: 0.01 10*3/uL (ref 0.00–0.07)
Basophils Absolute: 0 10*3/uL (ref 0.0–0.1)
Basophils Relative: 1 %
Eosinophils Absolute: 0.2 10*3/uL (ref 0.0–0.5)
Eosinophils Relative: 5 %
HCT: 32.9 % — ABNORMAL LOW (ref 39.0–52.0)
Hemoglobin: 11.5 g/dL — ABNORMAL LOW (ref 13.0–17.0)
Immature Granulocytes: 0 %
Lymphocytes Relative: 30 %
Lymphs Abs: 1.3 10*3/uL (ref 0.7–4.0)
MCH: 36.4 pg — ABNORMAL HIGH (ref 26.0–34.0)
MCHC: 35 g/dL (ref 30.0–36.0)
MCV: 104.1 fL — ABNORMAL HIGH (ref 80.0–100.0)
Monocytes Absolute: 0.4 10*3/uL (ref 0.1–1.0)
Monocytes Relative: 10 %
Neutro Abs: 2.5 10*3/uL (ref 1.7–7.7)
Neutrophils Relative %: 54 %
Platelet Count: 169 10*3/uL (ref 150–400)
RBC: 3.16 MIL/uL — ABNORMAL LOW (ref 4.22–5.81)
RDW: 13.1 % (ref 11.5–15.5)
WBC Count: 4.5 10*3/uL (ref 4.0–10.5)
nRBC: 0 % (ref 0.0–0.2)

## 2023-07-22 LAB — CMP (CANCER CENTER ONLY)
ALT: 14 U/L (ref 0–44)
AST: 23 U/L (ref 15–41)
Albumin: 3.9 g/dL (ref 3.5–5.0)
Alkaline Phosphatase: 67 U/L (ref 38–126)
Anion gap: 5 (ref 5–15)
BUN: 20 mg/dL (ref 8–23)
CO2: 28 mmol/L (ref 22–32)
Calcium: 9.6 mg/dL (ref 8.9–10.3)
Chloride: 105 mmol/L (ref 98–111)
Creatinine: 1.55 mg/dL — ABNORMAL HIGH (ref 0.61–1.24)
GFR, Estimated: 46 mL/min — ABNORMAL LOW (ref >60)
Glucose, Bld: 101 mg/dL — ABNORMAL HIGH (ref 70–99)
Potassium: 4.2 mmol/L (ref 3.5–5.1)
Sodium: 138 mmol/L (ref 135–145)
Total Bilirubin: 0.7 mg/dL (ref 0.0–1.2)
Total Protein: 5.8 g/dL — ABNORMAL LOW (ref 6.5–8.1)

## 2023-07-22 MED ORDER — SODIUM CHLORIDE 0.9 % IV SOLN: Freq: Once | INTRAVENOUS | Status: AC

## 2023-07-22 MED ORDER — DEXAMETHASONE SODIUM PHOSPHATE 10 MG/ML IJ SOLN
10.0000 mg | Freq: Once | INTRAMUSCULAR | Status: AC
  Administered 2023-07-22: 10 mg via INTRAVENOUS
  Filled 2023-07-22: qty 1

## 2023-07-22 MED ORDER — HEPARIN SOD (PORK) LOCK FLUSH 100 UNIT/ML IV SOLN
500.0000 [IU] | Freq: Once | INTRAVENOUS | Status: AC | PRN
  Administered 2023-07-22: 500 [IU]

## 2023-07-22 MED ORDER — ACETAMINOPHEN 500 MG PO TABS
1000.0000 mg | ORAL_TABLET | Freq: Once | ORAL | Status: AC
Start: 2023-07-22 — End: 2023-07-22
  Administered 2023-07-22: 1000 mg via ORAL
  Filled 2023-07-22: qty 2

## 2023-07-22 MED ORDER — SODIUM CHLORIDE 0.9% FLUSH
10.0000 mL | Freq: Once | INTRAVENOUS | Status: AC
  Administered 2023-07-22: 10 mL

## 2023-07-22 MED ORDER — SODIUM CHLORIDE 0.9% FLUSH
10.0000 mL | INTRAVENOUS | Status: DC | PRN
Start: 2023-07-22 — End: 2023-07-22
  Administered 2023-07-22: 10 mL

## 2023-07-22 MED ORDER — PROCHLORPERAZINE MALEATE 10 MG PO TABS
10.0000 mg | ORAL_TABLET | Freq: Once | ORAL | Status: AC
  Administered 2023-07-22: 10 mg via ORAL
  Filled 2023-07-22: qty 1

## 2023-07-22 MED ORDER — DEXTROSE 5 % IV SOLN
56.0000 mg/m2 | Freq: Once | INTRAVENOUS | Status: AC
  Administered 2023-07-22: 120 mg via INTRAVENOUS
  Filled 2023-07-22: qty 30

## 2023-07-22 NOTE — Progress Notes (Signed)
 Patient presents today for chemotherapy infusion.  Patient is in satisfactory condition with no new complaints voiced.  Vital signs are stable.  Labs reviewed and all labs are within treatment parameters with exception of Creatinine 1.55. MD made aware. Per Dr Onesimo okay to treat.  We will proceed with treatment per MD orders.

## 2023-07-22 NOTE — Progress Notes (Signed)
Per Dr Leonides Schanz pt ok for tx today with CR: 1.55

## 2023-07-27 ENCOUNTER — Encounter: Payer: Self-pay | Admitting: Hematology

## 2023-07-28 ENCOUNTER — Other Ambulatory Visit: Payer: Self-pay

## 2023-07-28 DIAGNOSIS — C9000 Multiple myeloma not having achieved remission: Secondary | ICD-10-CM

## 2023-07-28 DIAGNOSIS — C9001 Multiple myeloma in remission: Secondary | ICD-10-CM

## 2023-07-29 ENCOUNTER — Encounter: Payer: Self-pay | Admitting: Hematology

## 2023-07-29 MED ORDER — TIZANIDINE HCL 2 MG PO CAPS: 2.0000 mg | ORAL_CAPSULE | Freq: Three times a day (TID) | ORAL | 0 refills | Status: DC | PRN

## 2023-07-29 MED ORDER — TRAMADOL HCL 50 MG PO TABS: 50.0000 mg | ORAL_TABLET | Freq: Four times a day (QID) | ORAL | 0 refills | Status: DC | PRN

## 2023-08-02 ENCOUNTER — Other Ambulatory Visit: Payer: Self-pay | Admitting: Hematology

## 2023-08-02 ENCOUNTER — Encounter: Payer: Self-pay | Admitting: Hematology

## 2023-08-02 DIAGNOSIS — C9000 Multiple myeloma not having achieved remission: Secondary | ICD-10-CM

## 2023-08-04 ENCOUNTER — Other Ambulatory Visit: Payer: Self-pay

## 2023-08-04 ENCOUNTER — Encounter: Payer: Self-pay | Admitting: Hematology

## 2023-08-04 DIAGNOSIS — C9000 Multiple myeloma not having achieved remission: Secondary | ICD-10-CM

## 2023-08-04 MED ORDER — ACYCLOVIR 800 MG PO TABS: 800.0000 mg | ORAL_TABLET | Freq: Two times a day (BID) | ORAL | 0 refills | Status: DC

## 2023-08-05 ENCOUNTER — Inpatient Hospital Stay: Payer: Medicare Other

## 2023-08-05 ENCOUNTER — Inpatient Hospital Stay: Payer: Medicare Other | Attending: Hematology

## 2023-08-05 VITALS — BP 150/81 | HR 87 | Temp 98.0°F | Resp 16 | Ht 70.0 in | Wt 200.5 lb

## 2023-08-05 DIAGNOSIS — Z5112 Encounter for antineoplastic immunotherapy: Secondary | ICD-10-CM | POA: Diagnosis present

## 2023-08-05 DIAGNOSIS — C9000 Multiple myeloma not having achieved remission: Secondary | ICD-10-CM

## 2023-08-05 DIAGNOSIS — Z95828 Presence of other vascular implants and grafts: Secondary | ICD-10-CM

## 2023-08-05 DIAGNOSIS — Z7189 Other specified counseling: Secondary | ICD-10-CM

## 2023-08-05 DIAGNOSIS — C9001 Multiple myeloma in remission: Secondary | ICD-10-CM | POA: Diagnosis not present

## 2023-08-05 LAB — CBC WITH DIFFERENTIAL (CANCER CENTER ONLY)
Abs Immature Granulocytes: 0.01 10*3/uL (ref 0.00–0.07)
Basophils Absolute: 0 10*3/uL (ref 0.0–0.1)
Basophils Relative: 1 %
Eosinophils Absolute: 0.1 10*3/uL (ref 0.0–0.5)
Eosinophils Relative: 2 %
HCT: 32.5 % — ABNORMAL LOW (ref 39.0–52.0)
Hemoglobin: 11.2 g/dL — ABNORMAL LOW (ref 13.0–17.0)
Immature Granulocytes: 0 %
Lymphocytes Relative: 21 %
Lymphs Abs: 0.9 10*3/uL (ref 0.7–4.0)
MCH: 35.7 pg — ABNORMAL HIGH (ref 26.0–34.0)
MCHC: 34.5 g/dL (ref 30.0–36.0)
MCV: 103.5 fL — ABNORMAL HIGH (ref 80.0–100.0)
Monocytes Absolute: 0.4 10*3/uL (ref 0.1–1.0)
Monocytes Relative: 10 %
Neutro Abs: 2.9 10*3/uL (ref 1.7–7.7)
Neutrophils Relative %: 66 %
Platelet Count: 142 10*3/uL — ABNORMAL LOW (ref 150–400)
RBC: 3.14 MIL/uL — ABNORMAL LOW (ref 4.22–5.81)
RDW: 13.3 % (ref 11.5–15.5)
WBC Count: 4.4 10*3/uL (ref 4.0–10.5)
nRBC: 0 % (ref 0.0–0.2)

## 2023-08-05 LAB — CMP (CANCER CENTER ONLY)
ALT: 15 U/L (ref 0–44)
AST: 22 U/L (ref 15–41)
Albumin: 3.9 g/dL (ref 3.5–5.0)
Alkaline Phosphatase: 61 U/L (ref 38–126)
Anion gap: 5 (ref 5–15)
BUN: 22 mg/dL (ref 8–23)
CO2: 28 mmol/L (ref 22–32)
Calcium: 9.5 mg/dL (ref 8.9–10.3)
Chloride: 106 mmol/L (ref 98–111)
Creatinine: 1.48 mg/dL — ABNORMAL HIGH (ref 0.61–1.24)
GFR, Estimated: 49 mL/min — ABNORMAL LOW (ref >60)
Glucose, Bld: 126 mg/dL — ABNORMAL HIGH (ref 70–99)
Potassium: 4.3 mmol/L (ref 3.5–5.1)
Sodium: 139 mmol/L (ref 135–145)
Total Bilirubin: 0.7 mg/dL (ref 0.0–1.2)
Total Protein: 5.6 g/dL — ABNORMAL LOW (ref 6.5–8.1)

## 2023-08-05 MED ORDER — HEPARIN SOD (PORK) LOCK FLUSH 100 UNIT/ML IV SOLN
500.0000 [IU] | Freq: Once | INTRAVENOUS | Status: AC | PRN
  Administered 2023-08-05: 500 [IU]

## 2023-08-05 MED ORDER — ACETAMINOPHEN 500 MG PO TABS
1000.0000 mg | ORAL_TABLET | Freq: Once | ORAL | Status: AC
  Administered 2023-08-05: 1000 mg via ORAL
  Filled 2023-08-05: qty 2

## 2023-08-05 MED ORDER — SODIUM CHLORIDE 0.9% FLUSH
10.0000 mL | Freq: Once | INTRAVENOUS | Status: AC
  Administered 2023-08-05: 10 mL

## 2023-08-05 MED ORDER — SODIUM CHLORIDE 0.9% FLUSH
10.0000 mL | INTRAVENOUS | Status: DC | PRN
  Administered 2023-08-05: 10 mL

## 2023-08-05 MED ORDER — PROCHLORPERAZINE MALEATE 10 MG PO TABS
10.0000 mg | ORAL_TABLET | Freq: Once | ORAL | Status: AC
  Administered 2023-08-05: 10 mg via ORAL
  Filled 2023-08-05: qty 1

## 2023-08-05 MED ORDER — DEXAMETHASONE SODIUM PHOSPHATE 10 MG/ML IJ SOLN
10.0000 mg | Freq: Once | INTRAMUSCULAR | Status: AC
  Administered 2023-08-05: 10 mg via INTRAVENOUS
  Filled 2023-08-05: qty 1

## 2023-08-05 MED ORDER — CARFILZOMIB CHEMO INJECTION 60 MG
56.0000 mg/m2 | Freq: Once | INTRAVENOUS | Status: AC
  Administered 2023-08-05: 120 mg via INTRAVENOUS
  Filled 2023-08-05: qty 60

## 2023-08-05 MED ORDER — SODIUM CHLORIDE 0.9 % IV SOLN: Freq: Once | INTRAVENOUS | Status: AC

## 2023-08-05 NOTE — Patient Instructions (Signed)
 [facility]   Discharge Instructions: Thank you for choosing [facility] to provide your oncology and hematology care.   If you have a lab appointment with the Cancer Center, please go directly to the Cancer Center and check in at the registration area.   Wear comfortable clothing and clothing appropriate for easy access to any Portacath or PICC line.   We strive to give you quality time with your provider. You may need to reschedule your appointment if you arrive late (15 or more minutes).  Arriving late affects you and other patients whose appointments are after yours.  Also, if you miss three or more appointments without notifying the office, you may be dismissed from the clinic at the provider's discretion.      For prescription refill requests, have your pharmacy contact our office and allow 72 hours for refills to be completed.    Today you received the following chemotherapy and/or immunotherapy agents: Carfilzomib (Kyprolis)      To help prevent nausea and vomiting after your treatment, we encourage you to take your nausea medication as directed.  BELOW ARE SYMPTOMS THAT SHOULD BE REPORTED IMMEDIATELY: *FEVER GREATER THAN 100.4 F (38 C) OR HIGHER *CHILLS OR SWEATING *NAUSEA AND VOMITING THAT IS NOT CONTROLLED WITH YOUR NAUSEA MEDICATION *UNUSUAL SHORTNESS OF BREATH *UNUSUAL BRUISING OR BLEEDING *URINARY PROBLEMS (pain or burning when urinating, or frequent urination) *BOWEL PROBLEMS (unusual diarrhea, constipation, pain near the anus) TENDERNESS IN MOUTH AND THROAT WITH OR WITHOUT PRESENCE OF ULCERS (sore throat, sores in mouth, or a toothache) UNUSUAL RASH, SWELLING OR PAIN  UNUSUAL VAGINAL DISCHARGE OR ITCHING   Items with * indicate a potential emergency and should be followed up as soon as possible or go to the Emergency Department if any problems should occur.  Please show the CHEMOTHERAPY ALERT CARD or  IMMUNOTHERAPY ALERT CARD at check-in to the Emergency Department and triage nurse.  Should you have questions after your visit or need to cancel or reschedule your appointment, please contact [facility]  Dept: [phone]  and follow the prompts.  Office hours are 8:00 a.m. to 4:30 p.m. Monday - Friday. Please note that voicemails left after 4:00 p.m. may not be returned until the following business day.  We are closed weekends and major holidays. You have access to a nurse at all times for urgent questions. Please call the main number to the clinic Dept: [phone] and follow the prompts.   For any non-urgent questions, you may also contact your provider using MyChart. We now offer e-Visits for anyone 55 and older to request care online for non-urgent symptoms. For details visit [email or web address].   Also download the MyChart app! Go to the app store, search "MyChart", open the app, select [place], and log in with your MyChart username and password.

## 2023-08-10 ENCOUNTER — Encounter: Payer: Self-pay | Admitting: Hematology

## 2023-08-11 ENCOUNTER — Other Ambulatory Visit: Payer: Self-pay

## 2023-08-11 DIAGNOSIS — C9000 Multiple myeloma not having achieved remission: Secondary | ICD-10-CM

## 2023-08-12 LAB — MULTIPLE MYELOMA PANEL, SERUM
Albumin SerPl Elph-Mcnc: 3.5 g/dL (ref 2.9–4.4)
Albumin/Glob SerPl: 2 — ABNORMAL HIGH (ref 0.7–1.7)
Alpha 1: 0.3 g/dL (ref 0.0–0.4)
Alpha2 Glob SerPl Elph-Mcnc: 0.6 g/dL (ref 0.4–1.0)
B-Globulin SerPl Elph-Mcnc: 0.8 g/dL (ref 0.7–1.3)
Gamma Glob SerPl Elph-Mcnc: 0.2 g/dL — ABNORMAL LOW (ref 0.4–1.8)
Globulin, Total: 1.8 g/dL — ABNORMAL LOW (ref 2.2–3.9)
IgA: 25 mg/dL — ABNORMAL LOW (ref 61–437)
IgG (Immunoglobin G), Serum: 232 mg/dL — ABNORMAL LOW (ref 603–1613)
IgM (Immunoglobulin M), Srm: 5 mg/dL — ABNORMAL LOW (ref 15–143)
Total Protein ELP: 5.3 g/dL — ABNORMAL LOW (ref 6.0–8.5)

## 2023-08-13 ENCOUNTER — Encounter: Payer: Self-pay | Admitting: Hematology

## 2023-08-13 MED ORDER — FENTANYL 12 MCG/HR TD PT72: 1.0000 | MEDICATED_PATCH | TRANSDERMAL | 0 refills | Status: DC

## 2023-08-18 ENCOUNTER — Other Ambulatory Visit: Payer: Self-pay

## 2023-08-18 DIAGNOSIS — C9000 Multiple myeloma not having achieved remission: Secondary | ICD-10-CM

## 2023-08-19 ENCOUNTER — Inpatient Hospital Stay: Payer: Medicare Other

## 2023-08-19 VITALS — BP 122/81 | HR 74 | Temp 98.5°F | Resp 18 | Wt 200.4 lb

## 2023-08-19 DIAGNOSIS — C9000 Multiple myeloma not having achieved remission: Secondary | ICD-10-CM

## 2023-08-19 DIAGNOSIS — Z95828 Presence of other vascular implants and grafts: Secondary | ICD-10-CM

## 2023-08-19 DIAGNOSIS — Z7189 Other specified counseling: Secondary | ICD-10-CM

## 2023-08-19 DIAGNOSIS — Z5112 Encounter for antineoplastic immunotherapy: Secondary | ICD-10-CM | POA: Diagnosis not present

## 2023-08-19 LAB — CMP (CANCER CENTER ONLY)
ALT: 15 U/L (ref 0–44)
AST: 21 U/L (ref 15–41)
Albumin: 3.8 g/dL (ref 3.5–5.0)
Alkaline Phosphatase: 60 U/L (ref 38–126)
Anion gap: 6 (ref 5–15)
BUN: 19 mg/dL (ref 8–23)
CO2: 28 mmol/L (ref 22–32)
Calcium: 9.5 mg/dL (ref 8.9–10.3)
Chloride: 105 mmol/L (ref 98–111)
Creatinine: 1.55 mg/dL — ABNORMAL HIGH (ref 0.61–1.24)
GFR, Estimated: 46 mL/min — ABNORMAL LOW (ref >60)
Glucose, Bld: 106 mg/dL — ABNORMAL HIGH (ref 70–99)
Potassium: 4.4 mmol/L (ref 3.5–5.1)
Sodium: 139 mmol/L (ref 135–145)
Total Bilirubin: 0.7 mg/dL (ref 0.0–1.2)
Total Protein: 5.7 g/dL — ABNORMAL LOW (ref 6.5–8.1)

## 2023-08-19 LAB — CBC WITH DIFFERENTIAL (CANCER CENTER ONLY)
Abs Immature Granulocytes: 0.01 10*3/uL (ref 0.00–0.07)
Basophils Absolute: 0 10*3/uL (ref 0.0–0.1)
Basophils Relative: 1 %
Eosinophils Absolute: 0.1 10*3/uL (ref 0.0–0.5)
Eosinophils Relative: 3 %
HCT: 34 % — ABNORMAL LOW (ref 39.0–52.0)
Hemoglobin: 11.3 g/dL — ABNORMAL LOW (ref 13.0–17.0)
Immature Granulocytes: 0 %
Lymphocytes Relative: 23 %
Lymphs Abs: 0.9 10*3/uL (ref 0.7–4.0)
MCH: 35.2 pg — ABNORMAL HIGH (ref 26.0–34.0)
MCHC: 33.2 g/dL (ref 30.0–36.0)
MCV: 105.9 fL — ABNORMAL HIGH (ref 80.0–100.0)
Monocytes Absolute: 0.4 10*3/uL (ref 0.1–1.0)
Monocytes Relative: 10 %
Neutro Abs: 2.5 10*3/uL (ref 1.7–7.7)
Neutrophils Relative %: 63 %
Platelet Count: 170 10*3/uL (ref 150–400)
RBC: 3.21 MIL/uL — ABNORMAL LOW (ref 4.22–5.81)
RDW: 13.4 % (ref 11.5–15.5)
WBC Count: 4 10*3/uL (ref 4.0–10.5)
nRBC: 0 % (ref 0.0–0.2)

## 2023-08-19 MED ORDER — DEXAMETHASONE SODIUM PHOSPHATE 10 MG/ML IJ SOLN
10.0000 mg | Freq: Once | INTRAMUSCULAR | Status: AC
  Administered 2023-08-19: 10 mg via INTRAVENOUS
  Filled 2023-08-19: qty 1

## 2023-08-19 MED ORDER — DEXTROSE 5 % IV SOLN
56.0000 mg/m2 | Freq: Once | INTRAVENOUS | Status: AC
  Administered 2023-08-19: 120 mg via INTRAVENOUS
  Filled 2023-08-19: qty 60

## 2023-08-19 MED ORDER — SODIUM CHLORIDE 0.9% FLUSH
10.0000 mL | Freq: Once | INTRAVENOUS | Status: AC
  Administered 2023-08-19: 10 mL

## 2023-08-19 MED ORDER — ACETAMINOPHEN 500 MG PO TABS
1000.0000 mg | ORAL_TABLET | Freq: Once | ORAL | Status: AC
  Administered 2023-08-19: 1000 mg via ORAL
  Filled 2023-08-19: qty 2

## 2023-08-19 MED ORDER — HEPARIN SOD (PORK) LOCK FLUSH 100 UNIT/ML IV SOLN
500.0000 [IU] | Freq: Once | INTRAVENOUS | Status: AC | PRN
  Administered 2023-08-19: 500 [IU]

## 2023-08-19 MED ORDER — PROCHLORPERAZINE MALEATE 10 MG PO TABS
10.0000 mg | ORAL_TABLET | Freq: Once | ORAL | Status: AC
  Administered 2023-08-19: 10 mg via ORAL
  Filled 2023-08-19: qty 1

## 2023-08-19 MED ORDER — SODIUM CHLORIDE 0.9 % IV SOLN: Freq: Once | INTRAVENOUS | Status: AC

## 2023-08-19 MED ORDER — SODIUM CHLORIDE 0.9% FLUSH
10.0000 mL | INTRAVENOUS | Status: DC | PRN
Start: 2023-08-19 — End: 2023-08-19
  Administered 2023-08-19: 10 mL

## 2023-08-19 NOTE — Patient Instructions (Signed)
[facility]  Discharge Instructions: Thank you for choosing [facility] to provide your oncology and hematology care.   If you have a lab appointment with the Cancer Center, please go directly to the Cancer Center and check in at the registration area.   Wear comfortable clothing and clothing appropriate for easy access to any Portacath or PICC line.   We strive to give you quality time with your provider. You may need to reschedule your appointment if you arrive late (15 or more minutes).  Arriving late affects you and other patients whose appointments are after yours.  Also, if you miss three or more appointments without notifying the office, you may be dismissed from the clinic at the provider's discretion.      For prescription refill requests, have your pharmacy contact our office and allow 72 hours for refills to be completed.    Today you received the following chemotherapy and/or immunotherapy agents: Kyprolis.       To help prevent nausea and vomiting after your treatment, we encourage you to take your nausea medication as directed.  BELOW ARE SYMPTOMS THAT SHOULD BE REPORTED IMMEDIATELY: *FEVER GREATER THAN 100.4 F (38 C) OR HIGHER *CHILLS OR SWEATING *NAUSEA AND VOMITING THAT IS NOT CONTROLLED WITH YOUR NAUSEA MEDICATION *UNUSUAL SHORTNESS OF BREATH *UNUSUAL BRUISING OR BLEEDING *URINARY PROBLEMS (pain or burning when urinating, or frequent urination) *BOWEL PROBLEMS (unusual diarrhea, constipation, pain near the anus) TENDERNESS IN MOUTH AND THROAT WITH OR WITHOUT PRESENCE OF ULCERS (sore throat, sores in mouth, or a toothache) UNUSUAL RASH, SWELLING OR PAIN  UNUSUAL VAGINAL DISCHARGE OR ITCHING   Items with * indicate a potential emergency and should be followed up as soon as possible or go to the Emergency Department if any problems should occur.  Please show the CHEMOTHERAPY ALERT CARD or IMMUNOTHERAPY  ALERT CARD at check-in to the Emergency Department and triage nurse.  Should you have questions after your visit or need to cancel or reschedule your appointment, please contact [facility]  Dept: [phone]  and follow the prompts.  Office hours are 8:00 a.m. to 4:30 p.m. Monday - Friday. Please note that voicemails left after 4:00 p.m. may not be returned until the following business day.  We are closed weekends and major holidays. You have access to a nurse at all times for urgent questions. Please call the main number to the clinic Dept: [phone] and follow the prompts.   For any non-urgent questions, you may also contact your provider using MyChart. We now offer e-Visits for anyone 45 and older to request care online for non-urgent symptoms. For details visit [email or web address].   Also download the MyChart app! Go to the app store, search "MyChart", open the app, select [place], and log in with your MyChart username and password.

## 2023-08-19 NOTE — Progress Notes (Signed)
Per Candise Che MD, ok to treat with SCR 1.55 today.

## 2023-08-26 LAB — MULTIPLE MYELOMA PANEL, SERUM
Albumin SerPl Elph-Mcnc: 3.8 g/dL (ref 2.9–4.4)
Albumin/Glob SerPl: 2.3 — ABNORMAL HIGH (ref 0.7–1.7)
Alpha 1: 0.2 g/dL (ref 0.0–0.4)
Alpha2 Glob SerPl Elph-Mcnc: 0.7 g/dL (ref 0.4–1.0)
B-Globulin SerPl Elph-Mcnc: 0.7 g/dL (ref 0.7–1.3)
Gamma Glob SerPl Elph-Mcnc: 0.2 g/dL — ABNORMAL LOW (ref 0.4–1.8)
Globulin, Total: 1.7 g/dL — ABNORMAL LOW (ref 2.2–3.9)
IgA: 21 mg/dL — ABNORMAL LOW (ref 61–437)
IgG (Immunoglobin G), Serum: 200 mg/dL — ABNORMAL LOW (ref 603–1613)
IgM (Immunoglobulin M), Srm: 7 mg/dL — ABNORMAL LOW (ref 15–143)
Total Protein ELP: 5.5 g/dL — ABNORMAL LOW (ref 6.0–8.5)

## 2023-09-01 ENCOUNTER — Other Ambulatory Visit: Payer: Self-pay

## 2023-09-02 ENCOUNTER — Other Ambulatory Visit: Payer: Self-pay

## 2023-09-02 DIAGNOSIS — C9000 Multiple myeloma not having achieved remission: Secondary | ICD-10-CM

## 2023-09-03 ENCOUNTER — Inpatient Hospital Stay: Payer: Medicare Other | Attending: Hematology

## 2023-09-03 ENCOUNTER — Inpatient Hospital Stay: Payer: Medicare Other | Admitting: Hematology

## 2023-09-03 ENCOUNTER — Inpatient Hospital Stay: Payer: Medicare Other

## 2023-09-03 VITALS — BP 125/92 | HR 74 | Temp 97.2°F | Resp 16 | Wt 199.6 lb

## 2023-09-03 VITALS — BP 124/87 | HR 69 | Temp 98.3°F | Resp 16

## 2023-09-03 DIAGNOSIS — C9000 Multiple myeloma not having achieved remission: Secondary | ICD-10-CM | POA: Insufficient documentation

## 2023-09-03 DIAGNOSIS — Z5112 Encounter for antineoplastic immunotherapy: Secondary | ICD-10-CM | POA: Insufficient documentation

## 2023-09-03 DIAGNOSIS — Z7189 Other specified counseling: Secondary | ICD-10-CM

## 2023-09-03 DIAGNOSIS — Z5111 Encounter for antineoplastic chemotherapy: Secondary | ICD-10-CM | POA: Diagnosis not present

## 2023-09-03 DIAGNOSIS — Z95828 Presence of other vascular implants and grafts: Secondary | ICD-10-CM

## 2023-09-03 LAB — CBC WITH DIFFERENTIAL (CANCER CENTER ONLY)
Abs Immature Granulocytes: 0.01 10*3/uL (ref 0.00–0.07)
Basophils Absolute: 0 10*3/uL (ref 0.0–0.1)
Basophils Relative: 1 %
Eosinophils Absolute: 0.1 10*3/uL (ref 0.0–0.5)
Eosinophils Relative: 3 %
HCT: 35.2 % — ABNORMAL LOW (ref 39.0–52.0)
Hemoglobin: 11.9 g/dL — ABNORMAL LOW (ref 13.0–17.0)
Immature Granulocytes: 0 %
Lymphocytes Relative: 23 %
Lymphs Abs: 0.9 10*3/uL (ref 0.7–4.0)
MCH: 35.5 pg — ABNORMAL HIGH (ref 26.0–34.0)
MCHC: 33.8 g/dL (ref 30.0–36.0)
MCV: 105.1 fL — ABNORMAL HIGH (ref 80.0–100.0)
Monocytes Absolute: 0.3 10*3/uL (ref 0.1–1.0)
Monocytes Relative: 8 %
Neutro Abs: 2.6 10*3/uL (ref 1.7–7.7)
Neutrophils Relative %: 65 %
Platelet Count: 141 10*3/uL — ABNORMAL LOW (ref 150–400)
RBC: 3.35 MIL/uL — ABNORMAL LOW (ref 4.22–5.81)
RDW: 13.4 % (ref 11.5–15.5)
WBC Count: 4 10*3/uL (ref 4.0–10.5)
nRBC: 0 % (ref 0.0–0.2)

## 2023-09-03 LAB — CMP (CANCER CENTER ONLY)
ALT: 16 U/L (ref 0–44)
AST: 23 U/L (ref 15–41)
Albumin: 4 g/dL (ref 3.5–5.0)
Alkaline Phosphatase: 65 U/L (ref 38–126)
Anion gap: 5 (ref 5–15)
BUN: 17 mg/dL (ref 8–23)
CO2: 27 mmol/L (ref 22–32)
Calcium: 9.5 mg/dL (ref 8.9–10.3)
Chloride: 108 mmol/L (ref 98–111)
Creatinine: 1.47 mg/dL — ABNORMAL HIGH (ref 0.61–1.24)
GFR, Estimated: 49 mL/min — ABNORMAL LOW (ref >60)
Glucose, Bld: 128 mg/dL — ABNORMAL HIGH (ref 70–99)
Potassium: 4 mmol/L (ref 3.5–5.1)
Sodium: 140 mmol/L (ref 135–145)
Total Bilirubin: 0.7 mg/dL (ref 0.0–1.2)
Total Protein: 5.7 g/dL — ABNORMAL LOW (ref 6.5–8.1)

## 2023-09-03 MED ORDER — SODIUM CHLORIDE 0.9% FLUSH
10.0000 mL | INTRAVENOUS | Status: DC | PRN
  Administered 2023-09-03: 10 mL

## 2023-09-03 MED ORDER — SODIUM CHLORIDE 0.9% FLUSH
10.0000 mL | Freq: Once | INTRAVENOUS | Status: AC
  Administered 2023-09-03: 10 mL

## 2023-09-03 MED ORDER — ACETAMINOPHEN 500 MG PO TABS
1000.0000 mg | ORAL_TABLET | Freq: Once | ORAL | Status: AC
  Administered 2023-09-03: 1000 mg via ORAL
  Filled 2023-09-03: qty 2

## 2023-09-03 MED ORDER — PROCHLORPERAZINE MALEATE 10 MG PO TABS
10.0000 mg | ORAL_TABLET | Freq: Once | ORAL | Status: AC
  Administered 2023-09-03: 10 mg via ORAL
  Filled 2023-09-03: qty 1

## 2023-09-03 MED ORDER — DEXTROSE 5 % IV SOLN
56.0000 mg/m2 | Freq: Once | INTRAVENOUS | Status: AC
  Administered 2023-09-03: 120 mg via INTRAVENOUS
  Filled 2023-09-03: qty 60

## 2023-09-03 MED ORDER — HEPARIN SOD (PORK) LOCK FLUSH 100 UNIT/ML IV SOLN
500.0000 [IU] | Freq: Once | INTRAVENOUS | Status: AC | PRN
  Administered 2023-09-03: 500 [IU]

## 2023-09-03 MED ORDER — SODIUM CHLORIDE 0.9 % IV SOLN: Freq: Once | INTRAVENOUS | Status: AC

## 2023-09-03 MED ORDER — DEXAMETHASONE SODIUM PHOSPHATE 10 MG/ML IJ SOLN
10.0000 mg | Freq: Once | INTRAMUSCULAR | Status: AC
  Administered 2023-09-03: 10 mg via INTRAVENOUS
  Filled 2023-09-03: qty 1

## 2023-09-03 NOTE — Progress Notes (Signed)
Patient presents today for chemotherapy infusion of Kyprolis. Patient is in satisfactory condition with no new complaints voiced.  Vital signs are stable.  Labs reviewed by Dr. Rance Muir during the office visit and all labs are within treatment parameters.  We will proceed with treatment per MD orders.   Patient tolerated treatment well with no complaints voiced.  Patient left ambulatory in stable condition.  Vital signs stable at discharge.  Follow up as scheduled.

## 2023-09-03 NOTE — Patient Instructions (Signed)
[facility] - A DEPT OF [facility]  Discharge Instructions: Thank you for choosing [facility] to provide your oncology and hematology care.   If you have a lab appointment with the Cancer Center, please go directly to the Cancer Center and check in at the registration area.   Wear comfortable clothing and clothing appropriate for easy access to any Portacath or PICC line.   We strive to give you quality time with your provider. You may need to reschedule your appointment if you arrive late (15 or more minutes).  Arriving late affects you and other patients whose appointments are after yours.  Also, if you miss three or more appointments without notifying the office, you may be dismissed from the clinic at the provider's discretion.      For prescription refill requests, have your pharmacy contact our office and allow 72 hours for refills to be completed.    Today you received the following chemotherapy and/or immunotherapy agents Kyprolis.  Carfilzomib Injection What is this medication? CARFILZOMIB (kar FILZ oh mib) treats multiple myeloma, a type of bone marrow cancer. It works by blocking a protein that causes cancer cells to grow and multiply. This helps to slow or stop the spread of cancer cells. This medicine may be used for other purposes; ask your health care provider or pharmacist if you have questions. COMMON BRAND NAME(S): KYPROLIS What should I tell my care team before I take this medication? They need to know if you have any of these conditions: Heart disease History of blood clots Irregular heartbeat Kidney disease Liver disease Lung or breathing disease An unusual or allergic reaction to carfilzomib, or other medications, foods, dyes, or preservatives If you or your partner are pregnant or trying to get pregnant Breastfeeding How should I use this medication? This medication is injected into a vein. It is given by your care  team in a hospital or clinic setting. Talk to your care team about the use of this medication in children. Special care may be needed. Overdosage: If you think you have taken too much of this medicine contact a poison control center or emergency room at once. NOTE: This medicine is only for you. Do not share this medicine with others. What if I miss a dose? Keep appointments for follow-up doses. It is important not to miss your dose. Call your care team if you are unable to keep an appointment. What may interact with this medication? Interactions are not expected. This list may not describe all possible interactions. Give your health care provider a list of all the medicines, herbs, non-prescription drugs, or dietary supplements you use. Also tell them if you smoke, drink alcohol, or use illegal drugs. Some items may interact with your medicine. What should I watch for while using this medication? Your condition will be monitored carefully while you are receiving this medication. You may need blood work while taking this medication. Check with your care team if you have severe diarrhea, nausea, and vomiting, or if you sweat a lot. The loss of too much body fluid may make it dangerous for you to take this medication. This medication may affect your coordination, reaction time, or judgment. Do not drive or operate machinery until you know how this medication affects you. Sit up or stand slowly to reduce the risk of dizzy or fainting spells. Drinking alcohol with this medication can increase the risk of these side effects. Talk to your  care team if you may be pregnant. Serious birth defects can occur if you take this medication during pregnancy and for 6 months after the last dose. You will need a negative pregnancy test before starting this medication. Contraception is recommended while taking this medication and for 6 months after the last dose. Your care team can help you find an option that works for  you. If your partner can get pregnant, use a condom during sex while taking this medication and for 3 months after the last dose. Do not breastfeed while taking this medication and for 2 weeks after the last dose. This medication may cause infertility. Talk to your care team if you are concerned about your fertility. What side effects may I notice from receiving this medication? Side effects that you should report to your care team as soon as possible: Allergic reactions--skin rash, itching, hives, swelling of the face, lips, tongue, or throat Bleeding--bloody or black, tar-like stools, vomiting blood or brown material that looks like coffee grounds, red or dark brown urine, small red or purple spots on skin, unusual bruising or bleeding Blood clot--pain, swelling, or warmth in the leg, shortness of breath, chest pain Dizziness, loss of balance or coordination, confusion or trouble speaking Heart attack--pain or tightness in the chest, shoulders, arms, or jaw, nausea, shortness of breath, cold or clammy skin, feeling faint or lightheaded Heart failure--shortness of breath, swelling of the ankles, feet, or hands, sudden weight gain, unusual weakness or fatigue Heart rhythm changes--fast or irregular heartbeat, dizziness, feeling faint or lightheaded, chest pain, trouble breathing Increase in blood pressure Infection--fever, chills, cough, sore throat, wounds that don't heal, pain or trouble when passing urine, general feeling of discomfort or being unwell Infusion reactions--chest pain, shortness of breath or trouble breathing, feeling faint or lightheaded Kidney injury--decrease in the amount of urine, swelling of the ankles, hands, or feet Liver injury--right upper belly pain, loss of appetite, nausea, light-colored stool, dark yellow or brown urine, yellowing skin or eyes, unusual weakness or fatigue Lung injury--shortness of breath or trouble breathing, cough, spitting up blood, chest pain,  fever Pulmonary hypertension--shortness of breath, chest pain, fast or irregular heartbeat, feeling faint or lightheaded, fatigue, swelling of the ankles or feet Stomach pain, bloody diarrhea, pale skin, unusual weakness or fatigue, decrease in the amount of urine, which may be signs of hemolytic uremic syndrome Sudden and severe headache, confusion, change in vision, seizures, which may be signs of posterior reversible encephalopathy syndrome (PRES) TTP--purple spots on the skin or inside the mouth, pale skin, yellowing skin or eyes, unusual weakness or fatigue, fever, fast or irregular heartbeat, confusion, change in vision, trouble speaking, trouble walking Tumor lysis syndrome (TLS)--nausea, vomiting, diarrhea, decrease in the amount of urine, dark urine, unusual weakness or fatigue, confusion, muscle pain or cramps, fast or irregular heartbeat, joint pain Side effects that usually do not require medical attention (report to your care team if they continue or are bothersome): Diarrhea Fatigue Nausea Trouble sleeping This list may not describe all possible side effects. Call your doctor for medical advice about side effects. You may report side effects to FDA at 1-800-FDA-1088. Where should I keep my medication? This medication is given in a hospital or clinic. It will not be stored at home. NOTE: This sheet is a summary. It may not cover all possible information. If you have questions about this medicine, talk to your doctor, pharmacist, or health care provider.  2024 Elsevier/Gold Standard (2021-12-06 00:00:00)  To help prevent nausea and vomiting after your treatment, we encourage you to take your nausea medication as directed.  BELOW ARE SYMPTOMS THAT SHOULD BE REPORTED IMMEDIATELY: *FEVER GREATER THAN 100.4 F (38 C) OR HIGHER *CHILLS OR SWEATING *NAUSEA AND VOMITING THAT IS NOT CONTROLLED WITH YOUR NAUSEA MEDICATION *UNUSUAL SHORTNESS OF BREATH *UNUSUAL BRUISING OR  BLEEDING *URINARY PROBLEMS (pain or burning when urinating, or frequent urination) *BOWEL PROBLEMS (unusual diarrhea, constipation, pain near the anus) TENDERNESS IN MOUTH AND THROAT WITH OR WITHOUT PRESENCE OF ULCERS (sore throat, sores in mouth, or a toothache) UNUSUAL RASH, SWELLING OR PAIN  UNUSUAL VAGINAL DISCHARGE OR ITCHING   Items with * indicate a potential emergency and should be followed up as soon as possible or go to the Emergency Department if any problems should occur.  Please show the CHEMOTHERAPY ALERT CARD or IMMUNOTHERAPY ALERT CARD at check-in to the Emergency Department and triage nurse.  Should you have questions after your visit or need to cancel or reschedule your appointment, please contact [facility] - A DEPT OF [facility]  Dept: [phone]  and follow the prompts.  Office hours are 8:00 a.m. to 4:30 p.m. Monday - Friday. Please note that voicemails left after 4:00 p.m. may not be returned until the following business day.  We are closed weekends and major holidays. You have access to a nurse at all times for urgent questions. Please call the main number to the clinic Dept: [phone] and follow the prompts.   For any non-urgent questions, you may also contact your provider using MyChart. We now offer e-Visits for anyone 71 and older to request care online for non-urgent symptoms. For details visit [email or web address].   Also download the MyChart app! Go to the app store, search "MyChart", open the app, select [facility], and log in with your MyChart username and password.
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
## 2023-09-04 LAB — KAPPA/LAMBDA LIGHT CHAINS
Kappa free light chain: 10.7 mg/L (ref 3.3–19.4)
Kappa, lambda light chain ratio: 2.38 — ABNORMAL HIGH (ref 0.26–1.65)
Lambda free light chains: 4.5 mg/L — ABNORMAL LOW (ref 5.7–26.3)

## 2023-09-05 ENCOUNTER — Other Ambulatory Visit: Payer: Self-pay

## 2023-09-05 ENCOUNTER — Encounter: Payer: Self-pay | Admitting: Hematology

## 2023-09-05 DIAGNOSIS — C9000 Multiple myeloma not having achieved remission: Secondary | ICD-10-CM

## 2023-09-05 DIAGNOSIS — C9001 Multiple myeloma in remission: Secondary | ICD-10-CM

## 2023-09-05 MED ORDER — TRAMADOL HCL 50 MG PO TABS: 50.0000 mg | ORAL_TABLET | Freq: Four times a day (QID) | ORAL | 0 refills | Status: DC | PRN

## 2023-09-05 MED ORDER — FENTANYL 12 MCG/HR TD PT72: 1.0000 | MEDICATED_PATCH | TRANSDERMAL | 0 refills | Status: DC

## 2023-09-05 MED ORDER — TIZANIDINE HCL 2 MG PO CAPS: 2.0000 mg | ORAL_CAPSULE | Freq: Three times a day (TID) | ORAL | 0 refills | Status: DC | PRN

## 2023-09-07 LAB — MULTIPLE MYELOMA PANEL, SERUM
Albumin SerPl Elph-Mcnc: 3.5 g/dL (ref 2.9–4.4)
Albumin/Glob SerPl: 1.7 (ref 0.7–1.7)
Alpha 1: 0.3 g/dL (ref 0.0–0.4)
Alpha2 Glob SerPl Elph-Mcnc: 0.7 g/dL (ref 0.4–1.0)
B-Globulin SerPl Elph-Mcnc: 0.9 g/dL (ref 0.7–1.3)
Gamma Glob SerPl Elph-Mcnc: 0.3 g/dL — ABNORMAL LOW (ref 0.4–1.8)
Globulin, Total: 2.1 g/dL — ABNORMAL LOW (ref 2.2–3.9)
IgA: 19 mg/dL — ABNORMAL LOW (ref 61–437)
IgG (Immunoglobin G), Serum: 210 mg/dL — ABNORMAL LOW (ref 603–1613)
IgM (Immunoglobulin M), Srm: 6 mg/dL — ABNORMAL LOW (ref 15–143)
Total Protein ELP: 5.6 g/dL — ABNORMAL LOW (ref 6.0–8.5)

## 2023-09-09 ENCOUNTER — Other Ambulatory Visit (HOSPITAL_COMMUNITY): Payer: Self-pay

## 2023-09-10 ENCOUNTER — Encounter: Payer: Self-pay | Admitting: Hematology

## 2023-09-12 ENCOUNTER — Other Ambulatory Visit: Payer: Self-pay

## 2023-09-12 DIAGNOSIS — C9000 Multiple myeloma not having achieved remission: Secondary | ICD-10-CM

## 2023-09-16 ENCOUNTER — Inpatient Hospital Stay: Payer: Medicare Other

## 2023-09-16 ENCOUNTER — Inpatient Hospital Stay: Payer: Medicare Other | Admitting: Hematology

## 2023-09-16 VITALS — BP 132/78 | HR 68 | Temp 98.2°F | Resp 18 | Wt 197.8 lb

## 2023-09-16 DIAGNOSIS — C9000 Multiple myeloma not having achieved remission: Secondary | ICD-10-CM

## 2023-09-16 DIAGNOSIS — Z95828 Presence of other vascular implants and grafts: Secondary | ICD-10-CM

## 2023-09-16 DIAGNOSIS — Z7189 Other specified counseling: Secondary | ICD-10-CM

## 2023-09-16 DIAGNOSIS — Z5112 Encounter for antineoplastic immunotherapy: Secondary | ICD-10-CM | POA: Diagnosis not present

## 2023-09-16 LAB — CBC WITH DIFFERENTIAL (CANCER CENTER ONLY)
Abs Immature Granulocytes: 0 10*3/uL (ref 0.00–0.07)
Basophils Absolute: 0 10*3/uL (ref 0.0–0.1)
Basophils Relative: 1 %
Eosinophils Absolute: 0.1 10*3/uL (ref 0.0–0.5)
Eosinophils Relative: 3 %
HCT: 34.8 % — ABNORMAL LOW (ref 39.0–52.0)
Hemoglobin: 11.8 g/dL — ABNORMAL LOW (ref 13.0–17.0)
Immature Granulocytes: 0 %
Lymphocytes Relative: 24 %
Lymphs Abs: 0.9 10*3/uL (ref 0.7–4.0)
MCH: 35.6 pg — ABNORMAL HIGH (ref 26.0–34.0)
MCHC: 33.9 g/dL (ref 30.0–36.0)
MCV: 105.1 fL — ABNORMAL HIGH (ref 80.0–100.0)
Monocytes Absolute: 0.3 10*3/uL (ref 0.1–1.0)
Monocytes Relative: 8 %
Neutro Abs: 2.5 10*3/uL (ref 1.7–7.7)
Neutrophils Relative %: 64 %
Platelet Count: 160 10*3/uL (ref 150–400)
RBC: 3.31 MIL/uL — ABNORMAL LOW (ref 4.22–5.81)
RDW: 13.6 % (ref 11.5–15.5)
WBC Count: 3.8 10*3/uL — ABNORMAL LOW (ref 4.0–10.5)
nRBC: 0 % (ref 0.0–0.2)

## 2023-09-16 LAB — CMP (CANCER CENTER ONLY)
ALT: 16 U/L (ref 0–44)
AST: 24 U/L (ref 15–41)
Albumin: 3.9 g/dL (ref 3.5–5.0)
Alkaline Phosphatase: 60 U/L (ref 38–126)
Anion gap: 7 (ref 5–15)
BUN: 19 mg/dL (ref 8–23)
CO2: 25 mmol/L (ref 22–32)
Calcium: 9 mg/dL (ref 8.9–10.3)
Chloride: 108 mmol/L (ref 98–111)
Creatinine: 1.55 mg/dL — ABNORMAL HIGH (ref 0.61–1.24)
GFR, Estimated: 46 mL/min — ABNORMAL LOW (ref >60)
Glucose, Bld: 148 mg/dL — ABNORMAL HIGH (ref 70–99)
Potassium: 4 mmol/L (ref 3.5–5.1)
Sodium: 140 mmol/L (ref 135–145)
Total Bilirubin: 0.8 mg/dL (ref 0.0–1.2)
Total Protein: 5.7 g/dL — ABNORMAL LOW (ref 6.5–8.1)

## 2023-09-16 MED ORDER — PROCHLORPERAZINE MALEATE 10 MG PO TABS
10.0000 mg | ORAL_TABLET | Freq: Once | ORAL | Status: AC
  Administered 2023-09-16: 10 mg via ORAL
  Filled 2023-09-16: qty 1

## 2023-09-16 MED ORDER — SODIUM CHLORIDE 0.9 % IV SOLN: Freq: Once | INTRAVENOUS | Status: AC

## 2023-09-16 MED ORDER — SODIUM CHLORIDE 0.9% FLUSH
10.0000 mL | Freq: Once | INTRAVENOUS | Status: AC
  Administered 2023-09-16: 10 mL

## 2023-09-16 MED ORDER — ACETAMINOPHEN 500 MG PO TABS
1000.0000 mg | ORAL_TABLET | Freq: Once | ORAL | Status: AC
  Administered 2023-09-16: 1000 mg via ORAL
  Filled 2023-09-16: qty 2

## 2023-09-16 MED ORDER — DEXAMETHASONE SODIUM PHOSPHATE 10 MG/ML IJ SOLN
10.0000 mg | Freq: Once | INTRAMUSCULAR | Status: AC
  Administered 2023-09-16: 10 mg via INTRAVENOUS
  Filled 2023-09-16: qty 1

## 2023-09-16 MED ORDER — DEXTROSE 5 % IV SOLN
56.0000 mg/m2 | Freq: Once | INTRAVENOUS | Status: AC
  Administered 2023-09-16: 120 mg via INTRAVENOUS
  Filled 2023-09-16: qty 60
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
## 2023-09-16 NOTE — Patient Instructions (Signed)
 [facility]   Discharge Instructions: Thank you for choosing [facility] to provide your oncology and hematology care.   If you have a lab appointment with the Cancer Center, please go directly to the Cancer Center and check in at the registration area.   Wear comfortable clothing and clothing appropriate for easy access to any Portacath or PICC line.   We strive to give you quality time with your provider. You may need to reschedule your appointment if you arrive late (15 or more minutes).  Arriving late affects you and other patients whose appointments are after yours.  Also, if you miss three or more appointments without notifying the office, you may be dismissed from the clinic at the provider's discretion.      For prescription refill requests, have your pharmacy contact our office and allow 72 hours for refills to be completed.    Today you received the following chemotherapy and/or immunotherapy agents: Carfilzomib (Kyprolis)      To help prevent nausea and vomiting after your treatment, we encourage you to take your nausea medication as directed.  BELOW ARE SYMPTOMS THAT SHOULD BE REPORTED IMMEDIATELY: *FEVER GREATER THAN 100.4 F (38 C) OR HIGHER *CHILLS OR SWEATING *NAUSEA AND VOMITING THAT IS NOT CONTROLLED WITH YOUR NAUSEA MEDICATION *UNUSUAL SHORTNESS OF BREATH *UNUSUAL BRUISING OR BLEEDING *URINARY PROBLEMS (pain or burning when urinating, or frequent urination) *BOWEL PROBLEMS (unusual diarrhea, constipation, pain near the anus) TENDERNESS IN MOUTH AND THROAT WITH OR WITHOUT PRESENCE OF ULCERS (sore throat, sores in mouth, or a toothache) UNUSUAL RASH, SWELLING OR PAIN  UNUSUAL VAGINAL DISCHARGE OR ITCHING   Items with * indicate a potential emergency and should be followed up as soon as possible or go to the Emergency Department if any problems should occur.  Please show the CHEMOTHERAPY ALERT CARD or  IMMUNOTHERAPY ALERT CARD at check-in to the Emergency Department and triage nurse.  Should you have questions after your visit or need to cancel or reschedule your appointment, please contact [facility]  Dept: [phone]  and follow the prompts.  Office hours are 8:00 a.m. to 4:30 p.m. Monday - Friday. Please note that voicemails left after 4:00 p.m. may not be returned until the following business day.  We are closed weekends and major holidays. You have access to a nurse at all times for urgent questions. Please call the main number to the clinic Dept: [phone] and follow the prompts.   For any non-urgent questions, you may also contact your provider using MyChart. We now offer e-Visits for anyone 55 and older to request care online for non-urgent symptoms. For details visit [email or web address].   Also download the MyChart app! Go to the app store, search "MyChart", open the app, select [place], and log in with your MyChart username and password.

## 2023-09-18 ENCOUNTER — Other Ambulatory Visit: Payer: Self-pay

## 2023-09-18 ENCOUNTER — Encounter: Payer: Self-pay | Admitting: Hematology

## 2023-09-18 DIAGNOSIS — C9000 Multiple myeloma not having achieved remission: Secondary | ICD-10-CM

## 2023-09-18 MED ORDER — ERGOCALCIFEROL 1.25 MG (50000 UT) PO CAPS: 50000.0000 [IU] | ORAL_CAPSULE | ORAL | 3 refills | Status: DC

## 2023-09-29 ENCOUNTER — Other Ambulatory Visit: Payer: Self-pay | Admitting: *Deleted

## 2023-09-29 DIAGNOSIS — C9000 Multiple myeloma not having achieved remission: Secondary | ICD-10-CM

## 2023-09-30 ENCOUNTER — Inpatient Hospital Stay: Payer: Medicare Other | Attending: Hematology

## 2023-09-30 ENCOUNTER — Other Ambulatory Visit: Payer: Self-pay | Admitting: Hematology

## 2023-09-30 ENCOUNTER — Inpatient Hospital Stay: Payer: Medicare Other

## 2023-09-30 ENCOUNTER — Inpatient Hospital Stay: Payer: Medicare Other | Admitting: Hematology

## 2023-09-30 DIAGNOSIS — C9001 Multiple myeloma in remission: Secondary | ICD-10-CM | POA: Insufficient documentation

## 2023-09-30 DIAGNOSIS — C9000 Multiple myeloma not having achieved remission: Secondary | ICD-10-CM

## 2023-09-30 DIAGNOSIS — Z95828 Presence of other vascular implants and grafts: Secondary | ICD-10-CM

## 2023-09-30 DIAGNOSIS — Z5112 Encounter for antineoplastic immunotherapy: Secondary | ICD-10-CM | POA: Insufficient documentation

## 2023-09-30 DIAGNOSIS — Z7189 Other specified counseling: Secondary | ICD-10-CM

## 2023-09-30 LAB — CMP (CANCER CENTER ONLY)
ALT: 15 U/L (ref 0–44)
AST: 22 U/L (ref 15–41)
Albumin: 4 g/dL (ref 3.5–5.0)
Alkaline Phosphatase: 61 U/L (ref 38–126)
Anion gap: 5 (ref 5–15)
BUN: 22 mg/dL (ref 8–23)
CO2: 28 mmol/L (ref 22–32)
Calcium: 9.4 mg/dL (ref 8.9–10.3)
Chloride: 106 mmol/L (ref 98–111)
Creatinine: 1.47 mg/dL — ABNORMAL HIGH (ref 0.61–1.24)
GFR, Estimated: 49 mL/min — ABNORMAL LOW (ref >60)
Glucose, Bld: 89 mg/dL (ref 70–99)
Potassium: 4.4 mmol/L (ref 3.5–5.1)
Sodium: 139 mmol/L (ref 135–145)
Total Bilirubin: 0.7 mg/dL (ref 0.0–1.2)
Total Protein: 5.7 g/dL — ABNORMAL LOW (ref 6.5–8.1)

## 2023-09-30 LAB — CBC WITH DIFFERENTIAL (CANCER CENTER ONLY)
Abs Immature Granulocytes: 0.01 10*3/uL (ref 0.00–0.07)
Basophils Absolute: 0 10*3/uL (ref 0.0–0.1)
Basophils Relative: 1 %
Eosinophils Absolute: 0.1 10*3/uL (ref 0.0–0.5)
Eosinophils Relative: 2 %
HCT: 33.7 % — ABNORMAL LOW (ref 39.0–52.0)
Hemoglobin: 11.4 g/dL — ABNORMAL LOW (ref 13.0–17.0)
Immature Granulocytes: 0 %
Lymphocytes Relative: 27 %
Lymphs Abs: 1.2 10*3/uL (ref 0.7–4.0)
MCH: 35 pg — ABNORMAL HIGH (ref 26.0–34.0)
MCHC: 33.8 g/dL (ref 30.0–36.0)
MCV: 103.4 fL — ABNORMAL HIGH (ref 80.0–100.0)
Monocytes Absolute: 0.4 10*3/uL (ref 0.1–1.0)
Monocytes Relative: 10 %
Neutro Abs: 2.6 10*3/uL (ref 1.7–7.7)
Neutrophils Relative %: 60 %
Platelet Count: 153 10*3/uL (ref 150–400)
RBC: 3.26 MIL/uL — ABNORMAL LOW (ref 4.22–5.81)
RDW: 13.6 % (ref 11.5–15.5)
WBC Count: 4.3 10*3/uL (ref 4.0–10.5)
nRBC: 0 % (ref 0.0–0.2)

## 2023-09-30 MED ORDER — ACETAMINOPHEN 500 MG PO TABS
1000.0000 mg | ORAL_TABLET | Freq: Once | ORAL | Status: AC
  Administered 2023-09-30: 1000 mg via ORAL
  Filled 2023-09-30: qty 2

## 2023-09-30 MED ORDER — PROCHLORPERAZINE MALEATE 10 MG PO TABS
10.0000 mg | ORAL_TABLET | Freq: Once | ORAL | Status: AC
  Administered 2023-09-30: 10 mg via ORAL
  Filled 2023-09-30: qty 1

## 2023-09-30 MED ORDER — SODIUM CHLORIDE 0.9% FLUSH
10.0000 mL | INTRAVENOUS | Status: DC | PRN
  Administered 2023-09-30: 10 mL

## 2023-09-30 MED ORDER — DEXTROSE 5 % IV SOLN
56.0000 mg/m2 | Freq: Once | INTRAVENOUS | Status: AC
  Administered 2023-09-30: 120 mg via INTRAVENOUS
  Filled 2023-09-30: qty 60

## 2023-09-30 MED ORDER — SODIUM CHLORIDE 0.9 % IV SOLN: INTRAVENOUS | Status: DC

## 2023-09-30 MED ORDER — SODIUM CHLORIDE 0.9 % IV SOLN: Freq: Once | INTRAVENOUS | Status: AC

## 2023-09-30 MED ORDER — DEXAMETHASONE SODIUM PHOSPHATE 10 MG/ML IJ SOLN
10.0000 mg | Freq: Once | INTRAMUSCULAR | Status: AC
  Administered 2023-09-30: 10 mg via INTRAVENOUS
  Filled 2023-09-30: qty 1

## 2023-09-30 MED ORDER — SODIUM CHLORIDE 0.9% FLUSH: 10.0000 mL | Freq: Once | INTRAVENOUS | Status: DC

## 2023-09-30 MED ORDER — HEPARIN SOD (PORK) LOCK FLUSH 100 UNIT/ML IV SOLN
500.0000 [IU] | Freq: Once | INTRAVENOUS | Status: AC | PRN
  Administered 2023-09-30: 500 [IU]

## 2023-09-30 NOTE — Patient Instructions (Signed)

## 2023-10-01 ENCOUNTER — Other Ambulatory Visit: Payer: Self-pay

## 2023-10-01 ENCOUNTER — Encounter: Payer: Self-pay | Admitting: Hematology

## 2023-10-01 DIAGNOSIS — C9000 Multiple myeloma not having achieved remission: Secondary | ICD-10-CM

## 2023-10-01 DIAGNOSIS — C9001 Multiple myeloma in remission: Secondary | ICD-10-CM

## 2023-10-01 LAB — KAPPA/LAMBDA LIGHT CHAINS
Kappa free light chain: 8.6 mg/L (ref 3.3–19.4)
Kappa, lambda light chain ratio: 1.59 (ref 0.26–1.65)
Lambda free light chains: 5.4 mg/L — ABNORMAL LOW (ref 5.7–26.3)

## 2023-10-01 MED ORDER — ONDANSETRON HCL 8 MG PO TABS: 8.0000 mg | ORAL_TABLET | Freq: Three times a day (TID) | ORAL | 0 refills | Status: DC | PRN

## 2023-10-01 MED ORDER — TIZANIDINE HCL 2 MG PO CAPS: 2.0000 mg | ORAL_CAPSULE | Freq: Three times a day (TID) | ORAL | 0 refills | Status: DC | PRN

## 2023-10-02 ENCOUNTER — Other Ambulatory Visit: Payer: Self-pay | Admitting: Hematology

## 2023-10-02 DIAGNOSIS — I82419 Acute embolism and thrombosis of unspecified femoral vein: Secondary | ICD-10-CM

## 2023-10-02 DIAGNOSIS — C9000 Multiple myeloma not having achieved remission: Secondary | ICD-10-CM

## 2023-10-02 LAB — MULTIPLE MYELOMA PANEL, SERUM
Albumin SerPl Elph-Mcnc: 3.5 g/dL (ref 2.9–4.4)
Albumin/Glob SerPl: 1.8 — ABNORMAL HIGH (ref 0.7–1.7)
Alpha 1: 0.3 g/dL (ref 0.0–0.4)
Alpha2 Glob SerPl Elph-Mcnc: 0.7 g/dL (ref 0.4–1.0)
B-Globulin SerPl Elph-Mcnc: 0.9 g/dL (ref 0.7–1.3)
Gamma Glob SerPl Elph-Mcnc: 0.2 g/dL — ABNORMAL LOW (ref 0.4–1.8)
Globulin, Total: 2 g/dL — ABNORMAL LOW (ref 2.2–3.9)
IgA: 17 mg/dL — ABNORMAL LOW (ref 61–437)
IgG (Immunoglobin G), Serum: 200 mg/dL — ABNORMAL LOW (ref 603–1613)
IgM (Immunoglobulin M), Srm: <5 mg/dL — ABNORMAL LOW (ref 15–143)
Total Protein ELP: 5.5 g/dL — ABNORMAL LOW (ref 6.0–8.5)

## 2023-10-03 ENCOUNTER — Encounter: Payer: Self-pay | Admitting: Hematology

## 2023-10-06 ENCOUNTER — Encounter: Payer: Self-pay | Admitting: Hematology

## 2023-10-06 MED ORDER — TRAZODONE HCL 50 MG PO TABS: 50.0000 mg | ORAL_TABLET | Freq: Every evening | ORAL | 1 refills | Status: DC | PRN

## 2023-10-06 MED ORDER — TRAMADOL HCL 50 MG PO TABS: 50.0000 mg | ORAL_TABLET | Freq: Four times a day (QID) | ORAL | 0 refills | Status: DC | PRN

## 2023-10-07 ENCOUNTER — Encounter: Payer: Self-pay | Admitting: Hematology

## 2023-10-07 ENCOUNTER — Other Ambulatory Visit: Payer: Self-pay

## 2023-10-07 DIAGNOSIS — C9000 Multiple myeloma not having achieved remission: Secondary | ICD-10-CM

## 2023-10-08 ENCOUNTER — Other Ambulatory Visit: Payer: Self-pay

## 2023-10-10 ENCOUNTER — Other Ambulatory Visit: Payer: Self-pay

## 2023-10-10 ENCOUNTER — Encounter: Payer: Self-pay | Admitting: Hematology

## 2023-10-10 DIAGNOSIS — C9000 Multiple myeloma not having achieved remission: Secondary | ICD-10-CM

## 2023-10-10 MED ORDER — FENTANYL 12 MCG/HR TD PT72
1.0000 | MEDICATED_PATCH | TRANSDERMAL | 0 refills | Status: DC
Start: 2023-10-10 — End: 2023-11-10

## 2023-10-14 ENCOUNTER — Inpatient Hospital Stay

## 2023-10-14 VITALS — BP 128/68 | HR 85 | Temp 98.5°F | Resp 18 | Wt 200.0 lb

## 2023-10-14 DIAGNOSIS — Z7189 Other specified counseling: Secondary | ICD-10-CM

## 2023-10-14 DIAGNOSIS — Z95828 Presence of other vascular implants and grafts: Secondary | ICD-10-CM

## 2023-10-14 DIAGNOSIS — C9000 Multiple myeloma not having achieved remission: Secondary | ICD-10-CM

## 2023-10-14 DIAGNOSIS — Z5112 Encounter for antineoplastic immunotherapy: Secondary | ICD-10-CM | POA: Diagnosis not present

## 2023-10-14 LAB — CBC WITH DIFFERENTIAL (CANCER CENTER ONLY)
Abs Immature Granulocytes: 0.01 10*3/uL (ref 0.00–0.07)
Basophils Absolute: 0 10*3/uL (ref 0.0–0.1)
Basophils Relative: 1 %
Eosinophils Absolute: 0.1 10*3/uL (ref 0.0–0.5)
Eosinophils Relative: 2 %
HCT: 35.5 % — ABNORMAL LOW (ref 39.0–52.0)
Hemoglobin: 12 g/dL — ABNORMAL LOW (ref 13.0–17.0)
Immature Granulocytes: 0 %
Lymphocytes Relative: 25 %
Lymphs Abs: 1.1 10*3/uL (ref 0.7–4.0)
MCH: 35.7 pg — ABNORMAL HIGH (ref 26.0–34.0)
MCHC: 33.8 g/dL (ref 30.0–36.0)
MCV: 105.7 fL — ABNORMAL HIGH (ref 80.0–100.0)
Monocytes Absolute: 0.3 10*3/uL (ref 0.1–1.0)
Monocytes Relative: 8 %
Neutro Abs: 2.7 10*3/uL (ref 1.7–7.7)
Neutrophils Relative %: 64 %
Platelet Count: 164 10*3/uL (ref 150–400)
RBC: 3.36 MIL/uL — ABNORMAL LOW (ref 4.22–5.81)
RDW: 13.6 % (ref 11.5–15.5)
WBC Count: 4.2 10*3/uL (ref 4.0–10.5)
nRBC: 0 % (ref 0.0–0.2)

## 2023-10-14 LAB — CMP (CANCER CENTER ONLY)
ALT: 16 U/L (ref 0–44)
AST: 22 U/L (ref 15–41)
Albumin: 4.1 g/dL (ref 3.5–5.0)
Alkaline Phosphatase: 62 U/L (ref 38–126)
Anion gap: 6 (ref 5–15)
BUN: 20 mg/dL (ref 8–23)
CO2: 27 mmol/L (ref 22–32)
Calcium: 9.6 mg/dL (ref 8.9–10.3)
Chloride: 106 mmol/L (ref 98–111)
Creatinine: 1.58 mg/dL — ABNORMAL HIGH (ref 0.61–1.24)
GFR, Estimated: 45 mL/min — ABNORMAL LOW (ref >60)
Glucose, Bld: 146 mg/dL — ABNORMAL HIGH (ref 70–99)
Potassium: 4.1 mmol/L (ref 3.5–5.1)
Sodium: 139 mmol/L (ref 135–145)
Total Bilirubin: 0.7 mg/dL (ref 0.0–1.2)
Total Protein: 5.9 g/dL — ABNORMAL LOW (ref 6.5–8.1)

## 2023-10-14 MED ORDER — DEXAMETHASONE SODIUM PHOSPHATE 10 MG/ML IJ SOLN
10.0000 mg | Freq: Once | INTRAMUSCULAR | Status: AC
  Administered 2023-10-14: 10 mg via INTRAVENOUS
  Filled 2023-10-14: qty 1

## 2023-10-14 MED ORDER — ACETAMINOPHEN 500 MG PO TABS
1000.0000 mg | ORAL_TABLET | Freq: Once | ORAL | Status: AC
  Administered 2023-10-14: 1000 mg via ORAL
  Filled 2023-10-14: qty 2

## 2023-10-14 MED ORDER — HEPARIN SOD (PORK) LOCK FLUSH 100 UNIT/ML IV SOLN
500.0000 [IU] | Freq: Once | INTRAVENOUS | Status: AC | PRN
Start: 2023-10-14 — End: 2023-10-14
  Administered 2023-10-14: 500 [IU]

## 2023-10-14 MED ORDER — DEXTROSE 5 % IV SOLN
56.0000 mg/m2 | Freq: Once | INTRAVENOUS | Status: AC
  Administered 2023-10-14: 120 mg via INTRAVENOUS
  Filled 2023-10-14: qty 60

## 2023-10-14 MED ORDER — SODIUM CHLORIDE 0.9 % IV SOLN: INTRAVENOUS | Status: DC

## 2023-10-14 MED ORDER — SODIUM CHLORIDE 0.9 % IV SOLN: Freq: Once | INTRAVENOUS | Status: AC

## 2023-10-14 MED ORDER — SODIUM CHLORIDE 0.9% FLUSH
10.0000 mL | Freq: Once | INTRAVENOUS | Status: AC
  Administered 2023-10-14: 10 mL

## 2023-10-14 MED ORDER — SODIUM CHLORIDE 0.9% FLUSH
10.0000 mL | INTRAVENOUS | Status: DC | PRN
  Administered 2023-10-14: 10 mL

## 2023-10-14 MED ORDER — PROCHLORPERAZINE MALEATE 10 MG PO TABS
10.0000 mg | ORAL_TABLET | Freq: Once | ORAL | Status: AC
  Administered 2023-10-14: 10 mg via ORAL
  Filled 2023-10-14: qty 1

## 2023-10-14 NOTE — Patient Instructions (Signed)
 [facility] - A DEPT OF [facility]  Discharge Instructions: Thank you for choosing [facility] to provide your oncology and hematology care.   If you have a lab appointment with the Cancer Center, please go directly to the Cancer Center and check in at the registration area.   Wear comfortable clothing and clothing appropriate for easy access to any Portacath or PICC line.   We strive to give you quality time with your provider. You may need to reschedule your appointment if you arrive late (15 or more minutes).  Arriving late affects you and other patients whose appointments are after yours.  Also, if you miss three or more appointments without notifying the office, you may be dismissed from the clinic at the provider's discretion.      For prescription refill requests, have your pharmacy contact our office and allow 72 hours for refills to be completed.    Today you received the following chemotherapy and/or immunotherapy agent: Carfilzomib (Kyprolis)      To help prevent nausea and vomiting after your treatment, we encourage you to take your nausea medication as directed.  BELOW ARE SYMPTOMS THAT SHOULD BE REPORTED IMMEDIATELY: *FEVER GREATER THAN 100.4 F (38 C) OR HIGHER *CHILLS OR SWEATING *NAUSEA AND VOMITING THAT IS NOT CONTROLLED WITH YOUR NAUSEA MEDICATION *UNUSUAL SHORTNESS OF BREATH *UNUSUAL BRUISING OR BLEEDING *URINARY PROBLEMS (pain or burning when urinating, or frequent urination) *BOWEL PROBLEMS (unusual diarrhea, constipation, pain near the anus) TENDERNESS IN MOUTH AND THROAT WITH OR WITHOUT PRESENCE OF ULCERS (sore throat, sores in mouth, or a toothache) UNUSUAL RASH, SWELLING OR PAIN  UNUSUAL VAGINAL DISCHARGE OR ITCHING   Items with * indicate a potential emergency and should be followed up as soon as possible or go to the Emergency Department if any problems should occur.  Please show the CHEMOTHERAPY ALERT CARD or  IMMUNOTHERAPY ALERT CARD at check-in to the Emergency Department and triage nurse.  Should you have questions after your visit or need to cancel or reschedule your appointment, please contact [facility] - [facility] DE[facility]  Dept: [phone]  and follow the prompts.  Office hours are 8:00 a.m. to 4:30 p.m. Monday - Friday. Please note that voicemails left after 4:00 p.m. may not be returned until the following business day.  We are closed weekends and major holidays. You have access to a nurse at all times for urgent questions. Please call the main number to the clinic Dept: [phone] and follow the prompts.   For any non-urgent questions, you may also contact your provider using MyChart. We now offer e-Visits for anyone 81 and older to request care online for non-urgent symptoms. For details visit [email or web address].   Also download the MyChart app! Go to the app store, search "MyChart", open the app, select [place], and log in with your MyChart username and password.

## 2023-10-17 ENCOUNTER — Encounter: Payer: Self-pay | Admitting: Hematology

## 2023-10-21 NOTE — Progress Notes (Signed)
 Ondansetron 8 mg Tablets is approved for 90 tablets per 30 days through 07/21/2024. No other needs or concerns noted at this time.

## 2023-10-24 ENCOUNTER — Other Ambulatory Visit: Payer: Self-pay | Admitting: Hematology

## 2023-10-24 DIAGNOSIS — C9000 Multiple myeloma not having achieved remission: Secondary | ICD-10-CM

## 2023-10-28 ENCOUNTER — Inpatient Hospital Stay

## 2023-10-28 ENCOUNTER — Inpatient Hospital Stay: Attending: Hematology

## 2023-10-28 ENCOUNTER — Encounter: Payer: Self-pay | Admitting: Hematology

## 2023-10-28 ENCOUNTER — Inpatient Hospital Stay: Admitting: Hematology

## 2023-10-28 VITALS — BP 129/84 | HR 72 | Temp 98.8°F | Resp 17 | Wt 199.7 lb

## 2023-10-28 DIAGNOSIS — Z5112 Encounter for antineoplastic immunotherapy: Secondary | ICD-10-CM | POA: Insufficient documentation

## 2023-10-28 DIAGNOSIS — C9000 Multiple myeloma not having achieved remission: Secondary | ICD-10-CM | POA: Diagnosis not present

## 2023-10-28 DIAGNOSIS — Z7189 Other specified counseling: Secondary | ICD-10-CM

## 2023-10-28 DIAGNOSIS — Z5111 Encounter for antineoplastic chemotherapy: Secondary | ICD-10-CM | POA: Diagnosis not present

## 2023-10-28 DIAGNOSIS — C9001 Multiple myeloma in remission: Secondary | ICD-10-CM | POA: Diagnosis not present

## 2023-10-28 DIAGNOSIS — Z95828 Presence of other vascular implants and grafts: Secondary | ICD-10-CM

## 2023-10-28 LAB — CMP (CANCER CENTER ONLY)
ALT: 16 U/L (ref 0–44)
AST: 23 U/L (ref 15–41)
Albumin: 4 g/dL (ref 3.5–5.0)
Alkaline Phosphatase: 64 U/L (ref 38–126)
Anion gap: 7 (ref 5–15)
BUN: 18 mg/dL (ref 8–23)
CO2: 26 mmol/L (ref 22–32)
Calcium: 9.3 mg/dL (ref 8.9–10.3)
Chloride: 107 mmol/L (ref 98–111)
Creatinine: 1.56 mg/dL — ABNORMAL HIGH (ref 0.61–1.24)
GFR, Estimated: 46 mL/min — ABNORMAL LOW (ref >60)
Glucose, Bld: 126 mg/dL — ABNORMAL HIGH (ref 70–99)
Potassium: 4.2 mmol/L (ref 3.5–5.1)
Sodium: 140 mmol/L (ref 135–145)
Total Bilirubin: 0.7 mg/dL (ref 0.0–1.2)
Total Protein: 5.8 g/dL — ABNORMAL LOW (ref 6.5–8.1)

## 2023-10-28 LAB — CBC WITH DIFFERENTIAL (CANCER CENTER ONLY)
Abs Immature Granulocytes: 0.01 10*3/uL (ref 0.00–0.07)
Basophils Absolute: 0 10*3/uL (ref 0.0–0.1)
Basophils Relative: 1 %
Eosinophils Absolute: 0.1 10*3/uL (ref 0.0–0.5)
Eosinophils Relative: 3 %
HCT: 34 % — ABNORMAL LOW (ref 39.0–52.0)
Hemoglobin: 11.6 g/dL — ABNORMAL LOW (ref 13.0–17.0)
Immature Granulocytes: 0 %
Lymphocytes Relative: 22 %
Lymphs Abs: 0.9 10*3/uL (ref 0.7–4.0)
MCH: 35.5 pg — ABNORMAL HIGH (ref 26.0–34.0)
MCHC: 34.1 g/dL (ref 30.0–36.0)
MCV: 104 fL — ABNORMAL HIGH (ref 80.0–100.0)
Monocytes Absolute: 0.3 10*3/uL (ref 0.1–1.0)
Monocytes Relative: 9 %
Neutro Abs: 2.6 10*3/uL (ref 1.7–7.7)
Neutrophils Relative %: 65 %
Platelet Count: 151 10*3/uL (ref 150–400)
RBC: 3.27 MIL/uL — ABNORMAL LOW (ref 4.22–5.81)
RDW: 13.5 % (ref 11.5–15.5)
WBC Count: 3.9 10*3/uL — ABNORMAL LOW (ref 4.0–10.5)
nRBC: 0 % (ref 0.0–0.2)

## 2023-10-28 MED ORDER — DEXTROSE 5 % IV SOLN
56.0000 mg/m2 | Freq: Once | INTRAVENOUS | Status: AC
  Administered 2023-10-28: 120 mg via INTRAVENOUS
  Filled 2023-10-28: qty 60

## 2023-10-28 MED ORDER — DEXAMETHASONE SODIUM PHOSPHATE 10 MG/ML IJ SOLN
10.0000 mg | Freq: Once | INTRAMUSCULAR | Status: AC
  Administered 2023-10-28: 10 mg via INTRAVENOUS
  Filled 2023-10-28: qty 1

## 2023-10-28 MED ORDER — PROCHLORPERAZINE MALEATE 10 MG PO TABS
10.0000 mg | ORAL_TABLET | Freq: Once | ORAL | Status: AC
  Administered 2023-10-28: 10 mg via ORAL
  Filled 2023-10-28: qty 1

## 2023-10-28 MED ORDER — HEPARIN SOD (PORK) LOCK FLUSH 100 UNIT/ML IV SOLN
500.0000 [IU] | Freq: Once | INTRAVENOUS | Status: AC | PRN
  Administered 2023-10-28: 500 [IU]

## 2023-10-28 MED ORDER — SODIUM CHLORIDE 0.9 % IV SOLN: Freq: Once | INTRAVENOUS | Status: AC

## 2023-10-28 MED ORDER — SODIUM CHLORIDE 0.9 % IV SOLN: INTRAVENOUS | Status: DC

## 2023-10-28 MED ORDER — SODIUM CHLORIDE 0.9% FLUSH
10.0000 mL | INTRAVENOUS | Status: DC | PRN
  Administered 2023-10-28: 10 mL

## 2023-10-28 MED ORDER — ACETAMINOPHEN 500 MG PO TABS
1000.0000 mg | ORAL_TABLET | Freq: Once | ORAL | Status: AC
  Administered 2023-10-28: 1000 mg via ORAL
  Filled 2023-10-28: qty 2

## 2023-10-28 MED ORDER — SODIUM CHLORIDE 0.9% FLUSH
10.0000 mL | Freq: Once | INTRAVENOUS | Status: AC
  Administered 2023-10-28: 10 mL

## 2023-10-28 NOTE — Progress Notes (Signed)
 Patient seen by Dr. Addison Naegeli are within treatment parameters.  Labs reviewed: and are not all within treatment parameters.    Dr Candise Che aware CR: 1.56  Per physician team, patient is ready for treatment and there are NO modifications to the treatment plan.

## 2023-10-28 NOTE — Patient Instructions (Signed)
 [facility] - A DEPT OF [facility]  Discharge Instructions: Thank you for choosing [facility] to provide your oncology and hematology care.   If you have a lab appointment with the Cancer Center, please go directly to the Cancer Center and check in at the registration area.   Wear comfortable clothing and clothing appropriate for easy access to any Portacath or PICC line.   We strive to give you quality time with your provider. You may need to reschedule your appointment if you arrive late (15 or more minutes).  Arriving late affects you and other patients whose appointments are after yours.  Also, if you miss three or more appointments without notifying the office, you may be dismissed from the clinic at the provider's discretion.      For prescription refill requests, have your pharmacy contact our office and allow 72 hours for refills to be completed.    Today you received the following chemotherapy and/or immunotherapy agent: Carfilzomib (Kyprolis)      To help prevent nausea and vomiting after your treatment, we encourage you to take your nausea medication as directed.  BELOW ARE SYMPTOMS THAT SHOULD BE REPORTED IMMEDIATELY: *FEVER GREATER THAN 100.4 F (38 C) OR HIGHER *CHILLS OR SWEATING *NAUSEA AND VOMITING THAT IS NOT CONTROLLED WITH YOUR NAUSEA MEDICATION *UNUSUAL SHORTNESS OF BREATH *UNUSUAL BRUISING OR BLEEDING *URINARY PROBLEMS (pain or burning when urinating, or frequent urination) *BOWEL PROBLEMS (unusual diarrhea, constipation, pain near the anus) TENDERNESS IN MOUTH AND THROAT WITH OR WITHOUT PRESENCE OF ULCERS (sore throat, sores in mouth, or a toothache) UNUSUAL RASH, SWELLING OR PAIN  UNUSUAL VAGINAL DISCHARGE OR ITCHING   Items with * indicate a potential emergency and should be followed up as soon as possible or go to the Emergency Department if any problems should occur.  Please show the CHEMOTHERAPY ALERT CARD or  IMMUNOTHERAPY ALERT CARD at check-in to the Emergency Department and triage nurse.  Should you have questions after your visit or need to cancel or reschedule your appointment, please contact [facility] - [facility] DE[facility]  Dept: [phone]  and follow the prompts.  Office hours are 8:00 a.m. to 4:30 p.m. Monday - Friday. Please note that voicemails left after 4:00 p.m. may not be returned until the following business day.  We are closed weekends and major holidays. You have access to a nurse at all times for urgent questions. Please call the main number to the clinic Dept: [phone] and follow the prompts.   For any non-urgent questions, you may also contact your provider using MyChart. We now offer e-Visits for anyone 81 and older to request care online for non-urgent symptoms. For details visit [email or web address].   Also download the MyChart app! Go to the app store, search "MyChart", open the app, select [place], and log in with your MyChart username and password.
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
## 2023-10-30 ENCOUNTER — Other Ambulatory Visit: Payer: Self-pay

## 2023-11-03 ENCOUNTER — Encounter: Payer: Self-pay | Admitting: Hematology

## 2023-11-04 ENCOUNTER — Other Ambulatory Visit: Payer: Self-pay

## 2023-11-04 ENCOUNTER — Encounter: Payer: Self-pay | Admitting: Hematology

## 2023-11-04 DIAGNOSIS — C9001 Multiple myeloma in remission: Secondary | ICD-10-CM

## 2023-11-04 DIAGNOSIS — C9000 Multiple myeloma not having achieved remission: Secondary | ICD-10-CM

## 2023-11-06 ENCOUNTER — Encounter: Payer: Self-pay | Admitting: Hematology

## 2023-11-06 MED ORDER — TIZANIDINE HCL 2 MG PO CAPS
2.0000 mg | ORAL_CAPSULE | Freq: Three times a day (TID) | ORAL | 0 refills | Status: DC | PRN
Start: 2023-11-06 — End: 2023-11-26

## 2023-11-06 MED ORDER — TRAMADOL HCL 50 MG PO TABS
50.0000 mg | ORAL_TABLET | Freq: Four times a day (QID) | ORAL | 0 refills | Status: DC | PRN
Start: 2023-11-06 — End: 2024-01-15

## 2023-11-09 ENCOUNTER — Encounter: Payer: Self-pay | Admitting: Hematology

## 2023-11-10 ENCOUNTER — Other Ambulatory Visit: Payer: Self-pay

## 2023-11-10 DIAGNOSIS — C9000 Multiple myeloma not having achieved remission: Secondary | ICD-10-CM

## 2023-11-11 ENCOUNTER — Inpatient Hospital Stay

## 2023-11-11 ENCOUNTER — Encounter: Payer: Self-pay | Admitting: Hematology

## 2023-11-11 VITALS — BP 111/80 | HR 80 | Temp 98.1°F | Resp 16 | Wt 196.5 lb

## 2023-11-11 DIAGNOSIS — Z95828 Presence of other vascular implants and grafts: Secondary | ICD-10-CM

## 2023-11-11 DIAGNOSIS — Z7189 Other specified counseling: Secondary | ICD-10-CM

## 2023-11-11 DIAGNOSIS — C9000 Multiple myeloma not having achieved remission: Secondary | ICD-10-CM

## 2023-11-11 DIAGNOSIS — Z5112 Encounter for antineoplastic immunotherapy: Secondary | ICD-10-CM | POA: Diagnosis not present

## 2023-11-11 LAB — CMP (CANCER CENTER ONLY)
ALT: 15 U/L (ref 0–44)
AST: 23 U/L (ref 15–41)
Albumin: 4 g/dL (ref 3.5–5.0)
Alkaline Phosphatase: 65 U/L (ref 38–126)
Anion gap: 4 — ABNORMAL LOW (ref 5–15)
BUN: 18 mg/dL (ref 8–23)
CO2: 29 mmol/L (ref 22–32)
Calcium: 9.3 mg/dL (ref 8.9–10.3)
Chloride: 107 mmol/L (ref 98–111)
Creatinine: 1.58 mg/dL — ABNORMAL HIGH (ref 0.61–1.24)
GFR, Estimated: 45 mL/min — ABNORMAL LOW (ref >60)
Glucose, Bld: 132 mg/dL — ABNORMAL HIGH (ref 70–99)
Potassium: 4.2 mmol/L (ref 3.5–5.1)
Sodium: 140 mmol/L (ref 135–145)
Total Bilirubin: 0.7 mg/dL (ref 0.0–1.2)
Total Protein: 5.8 g/dL — ABNORMAL LOW (ref 6.5–8.1)

## 2023-11-11 LAB — CBC WITH DIFFERENTIAL (CANCER CENTER ONLY)
Abs Immature Granulocytes: 0.01 10*3/uL (ref 0.00–0.07)
Basophils Absolute: 0 10*3/uL (ref 0.0–0.1)
Basophils Relative: 1 %
Eosinophils Absolute: 0.1 10*3/uL (ref 0.0–0.5)
Eosinophils Relative: 1 %
HCT: 34.4 % — ABNORMAL LOW (ref 39.0–52.0)
Hemoglobin: 11.6 g/dL — ABNORMAL LOW (ref 13.0–17.0)
Immature Granulocytes: 0 %
Lymphocytes Relative: 27 %
Lymphs Abs: 1 10*3/uL (ref 0.7–4.0)
MCH: 35 pg — ABNORMAL HIGH (ref 26.0–34.0)
MCHC: 33.7 g/dL (ref 30.0–36.0)
MCV: 103.9 fL — ABNORMAL HIGH (ref 80.0–100.0)
Monocytes Absolute: 0.4 10*3/uL (ref 0.1–1.0)
Monocytes Relative: 10 %
Neutro Abs: 2.3 10*3/uL (ref 1.7–7.7)
Neutrophils Relative %: 61 %
Platelet Count: 155 10*3/uL (ref 150–400)
RBC: 3.31 MIL/uL — ABNORMAL LOW (ref 4.22–5.81)
RDW: 13.4 % (ref 11.5–15.5)
WBC Count: 3.8 10*3/uL — ABNORMAL LOW (ref 4.0–10.5)
nRBC: 0 % (ref 0.0–0.2)

## 2023-11-11 MED ORDER — DEXAMETHASONE SODIUM PHOSPHATE 10 MG/ML IJ SOLN
10.0000 mg | Freq: Once | INTRAMUSCULAR | Status: AC
  Administered 2023-11-11: 10 mg via INTRAVENOUS
  Filled 2023-11-11: qty 1

## 2023-11-11 MED ORDER — PROCHLORPERAZINE MALEATE 10 MG PO TABS
10.0000 mg | ORAL_TABLET | Freq: Once | ORAL | Status: AC
  Administered 2023-11-11: 10 mg via ORAL
  Filled 2023-11-11: qty 1

## 2023-11-11 MED ORDER — CARFILZOMIB CHEMO INJECTION 60 MG
56.0000 mg/m2 | Freq: Once | INTRAVENOUS | Status: AC
  Administered 2023-11-11: 120 mg via INTRAVENOUS
  Filled 2023-11-11: qty 60

## 2023-11-11 MED ORDER — HEPARIN SOD (PORK) LOCK FLUSH 100 UNIT/ML IV SOLN
500.0000 [IU] | Freq: Once | INTRAVENOUS | Status: AC | PRN
  Administered 2023-11-11: 500 [IU]

## 2023-11-11 MED ORDER — ACETAMINOPHEN 500 MG PO TABS
1000.0000 mg | ORAL_TABLET | Freq: Once | ORAL | Status: AC
  Administered 2023-11-11: 1000 mg via ORAL
  Filled 2023-11-11: qty 2

## 2023-11-11 MED ORDER — FENTANYL 12 MCG/HR TD PT72: 1.0000 | MEDICATED_PATCH | TRANSDERMAL | 0 refills | Status: DC

## 2023-11-11 MED ORDER — SODIUM CHLORIDE 0.9% FLUSH
10.0000 mL | Freq: Once | INTRAVENOUS | Status: AC
  Administered 2023-11-11: 10 mL

## 2023-11-11 MED ORDER — SODIUM CHLORIDE 0.9 % IV SOLN: Freq: Once | INTRAVENOUS | Status: AC

## 2023-11-11 MED ORDER — SODIUM CHLORIDE 0.9% FLUSH
10.0000 mL | INTRAVENOUS | Status: DC | PRN
  Administered 2023-11-11: 10 mL

## 2023-11-11 NOTE — Progress Notes (Signed)
 Creatinine 1.58. Dr. Salomon Cree notified. OK to treat with kyprolis  today.

## 2023-11-11 NOTE — Progress Notes (Signed)
 This encounter was created in error - please disregard.

## 2023-11-11 NOTE — Patient Instructions (Signed)
 [facility] - A DEPT OF [facility]  Discharge Instructions: Thank you for choosing [facility] to provide your oncology and hematology care.   If you have a lab appointment with the Cancer Center, please go directly to the Cancer Center and check in at the registration area.   Wear comfortable clothing and clothing appropriate for easy access to any Portacath or PICC line.   We strive to give you quality time with your provider. You may need to reschedule your appointment if you arrive late (15 or more minutes).  Arriving late affects you and other patients whose appointments are after yours.  Also, if you miss three or more appointments without notifying the office, you may be dismissed from the clinic at the provider's discretion.      For prescription refill requests, have your pharmacy contact our office and allow 72 hours for refills to be completed.    Today you received the following chemotherapy and/or immunotherapy agent: Carfilzomib (Kyprolis)      To help prevent nausea and vomiting after your treatment, we encourage you to take your nausea medication as directed.  BELOW ARE SYMPTOMS THAT SHOULD BE REPORTED IMMEDIATELY: *FEVER GREATER THAN 100.4 F (38 C) OR HIGHER *CHILLS OR SWEATING *NAUSEA AND VOMITING THAT IS NOT CONTROLLED WITH YOUR NAUSEA MEDICATION *UNUSUAL SHORTNESS OF BREATH *UNUSUAL BRUISING OR BLEEDING *URINARY PROBLEMS (pain or burning when urinating, or frequent urination) *BOWEL PROBLEMS (unusual diarrhea, constipation, pain near the anus) TENDERNESS IN MOUTH AND THROAT WITH OR WITHOUT PRESENCE OF ULCERS (sore throat, sores in mouth, or a toothache) UNUSUAL RASH, SWELLING OR PAIN  UNUSUAL VAGINAL DISCHARGE OR ITCHING   Items with * indicate a potential emergency and should be followed up as soon as possible or go to the Emergency Department if any problems should occur.  Please show the CHEMOTHERAPY ALERT CARD or  IMMUNOTHERAPY ALERT CARD at check-in to the Emergency Department and triage nurse.  Should you have questions after your visit or need to cancel or reschedule your appointment, please contact [facility] - [facility] DE[facility]  Dept: [phone]  and follow the prompts.  Office hours are 8:00 a.m. to 4:30 p.m. Monday - Friday. Please note that voicemails left after 4:00 p.m. may not be returned until the following business day.  We are closed weekends and major holidays. You have access to a nurse at all times for urgent questions. Please call the main number to the clinic Dept: [phone] and follow the prompts.   For any non-urgent questions, you may also contact your provider using MyChart. We now offer e-Visits for anyone 81 and older to request care online for non-urgent symptoms. For details visit [email or web address].   Also download the MyChart app! Go to the app store, search "MyChart", open the app, select [place], and log in with your MyChart username and password.

## 2023-11-25 ENCOUNTER — Inpatient Hospital Stay: Attending: Hematology

## 2023-11-25 ENCOUNTER — Inpatient Hospital Stay

## 2023-11-25 VITALS — BP 116/70 | HR 69 | Temp 98.0°F | Resp 17 | Wt 198.8 lb

## 2023-11-25 DIAGNOSIS — Z95828 Presence of other vascular implants and grafts: Secondary | ICD-10-CM

## 2023-11-25 DIAGNOSIS — Z5112 Encounter for antineoplastic immunotherapy: Secondary | ICD-10-CM | POA: Insufficient documentation

## 2023-11-25 DIAGNOSIS — C9001 Multiple myeloma in remission: Secondary | ICD-10-CM | POA: Insufficient documentation

## 2023-11-25 DIAGNOSIS — C9000 Multiple myeloma not having achieved remission: Secondary | ICD-10-CM

## 2023-11-25 DIAGNOSIS — Z7189 Other specified counseling: Secondary | ICD-10-CM

## 2023-11-25 LAB — CMP (CANCER CENTER ONLY)
ALT: 18 U/L (ref 0–44)
AST: 23 U/L (ref 15–41)
Albumin: 4.1 g/dL (ref 3.5–5.0)
Alkaline Phosphatase: 60 U/L (ref 38–126)
Anion gap: 4 — ABNORMAL LOW (ref 5–15)
BUN: 23 mg/dL (ref 8–23)
CO2: 28 mmol/L (ref 22–32)
Calcium: 9.7 mg/dL (ref 8.9–10.3)
Chloride: 108 mmol/L (ref 98–111)
Creatinine: 1.4 mg/dL — ABNORMAL HIGH (ref 0.61–1.24)
GFR, Estimated: 52 mL/min — ABNORMAL LOW (ref >60)
Glucose, Bld: 103 mg/dL — ABNORMAL HIGH (ref 70–99)
Potassium: 4.2 mmol/L (ref 3.5–5.1)
Sodium: 140 mmol/L (ref 135–145)
Total Bilirubin: 0.7 mg/dL (ref 0.0–1.2)
Total Protein: 5.8 g/dL — ABNORMAL LOW (ref 6.5–8.1)

## 2023-11-25 LAB — CBC WITH DIFFERENTIAL (CANCER CENTER ONLY)
Abs Immature Granulocytes: 0.02 10*3/uL (ref 0.00–0.07)
Basophils Absolute: 0 10*3/uL (ref 0.0–0.1)
Basophils Relative: 1 %
Eosinophils Absolute: 0.1 10*3/uL (ref 0.0–0.5)
Eosinophils Relative: 1 %
HCT: 34.6 % — ABNORMAL LOW (ref 39.0–52.0)
Hemoglobin: 11.8 g/dL — ABNORMAL LOW (ref 13.0–17.0)
Immature Granulocytes: 0 %
Lymphocytes Relative: 22 %
Lymphs Abs: 1.3 10*3/uL (ref 0.7–4.0)
MCH: 35.1 pg — ABNORMAL HIGH (ref 26.0–34.0)
MCHC: 34.1 g/dL (ref 30.0–36.0)
MCV: 103 fL — ABNORMAL HIGH (ref 80.0–100.0)
Monocytes Absolute: 0.5 10*3/uL (ref 0.1–1.0)
Monocytes Relative: 8 %
Neutro Abs: 4.2 10*3/uL (ref 1.7–7.7)
Neutrophils Relative %: 68 %
Platelet Count: 169 10*3/uL (ref 150–400)
RBC: 3.36 MIL/uL — ABNORMAL LOW (ref 4.22–5.81)
RDW: 13.6 % (ref 11.5–15.5)
WBC Count: 6 10*3/uL (ref 4.0–10.5)
nRBC: 0 % (ref 0.0–0.2)

## 2023-11-25 MED ORDER — HEPARIN SOD (PORK) LOCK FLUSH 100 UNIT/ML IV SOLN
250.0000 [IU] | Freq: Once | INTRAVENOUS | Status: AC | PRN
  Administered 2023-11-25: 250 [IU]

## 2023-11-25 MED ORDER — PROCHLORPERAZINE MALEATE 10 MG PO TABS
10.0000 mg | ORAL_TABLET | Freq: Once | ORAL | Status: AC
  Administered 2023-11-25: 10 mg via ORAL
  Filled 2023-11-25: qty 1

## 2023-11-25 MED ORDER — ACETAMINOPHEN 500 MG PO TABS
1000.0000 mg | ORAL_TABLET | Freq: Once | ORAL | Status: AC
  Administered 2023-11-25: 1000 mg via ORAL
  Filled 2023-11-25: qty 2

## 2023-11-25 MED ORDER — DEXTROSE 5 % IV SOLN
56.0000 mg/m2 | Freq: Once | INTRAVENOUS | Status: AC
  Administered 2023-11-25: 120 mg via INTRAVENOUS
  Filled 2023-11-25: qty 60

## 2023-11-25 MED ORDER — SODIUM CHLORIDE 0.9 % IV SOLN: INTRAVENOUS | Status: DC

## 2023-11-25 MED ORDER — SODIUM CHLORIDE 0.9% FLUSH
10.0000 mL | Freq: Once | INTRAVENOUS | Status: DC
  Administered 2023-11-25: 10 mL

## 2023-11-25 MED ORDER — DEXAMETHASONE SODIUM PHOSPHATE 10 MG/ML IJ SOLN
10.0000 mg | Freq: Once | INTRAMUSCULAR | Status: AC
  Administered 2023-11-25: 10 mg via INTRAVENOUS
  Filled 2023-11-25: qty 1

## 2023-11-25 MED ORDER — SODIUM CHLORIDE 0.9 % IV SOLN: Freq: Once | INTRAVENOUS | Status: AC

## 2023-11-26 ENCOUNTER — Encounter: Payer: Self-pay | Admitting: Hematology

## 2023-11-26 ENCOUNTER — Other Ambulatory Visit: Payer: Self-pay

## 2023-11-26 DIAGNOSIS — C9000 Multiple myeloma not having achieved remission: Secondary | ICD-10-CM

## 2023-11-26 MED ORDER — TIZANIDINE HCL 2 MG PO CAPS: 2.0000 mg | ORAL_CAPSULE | Freq: Three times a day (TID) | ORAL | 0 refills | Status: DC | PRN

## 2023-11-27 ENCOUNTER — Encounter: Payer: Self-pay | Admitting: Hematology

## 2023-12-05 LAB — MULTIPLE MYELOMA PANEL, SERUM
Albumin SerPl Elph-Mcnc: 3.6 g/dL (ref 2.9–4.4)
Albumin/Glob SerPl: 2.2 — ABNORMAL HIGH (ref 0.7–1.7)
Alpha 1: 0.2 g/dL (ref 0.0–0.4)
Alpha2 Glob SerPl Elph-Mcnc: 0.5 g/dL (ref 0.4–1.0)
B-Globulin SerPl Elph-Mcnc: 0.6 g/dL — ABNORMAL LOW (ref 0.7–1.3)
Gamma Glob SerPl Elph-Mcnc: 0.3 g/dL — ABNORMAL LOW (ref 0.4–1.8)
Globulin, Total: 1.7 g/dL — ABNORMAL LOW (ref 2.2–3.9)
IgA: 24 mg/dL — ABNORMAL LOW (ref 61–437)
IgG (Immunoglobin G), Serum: 283 mg/dL — ABNORMAL LOW (ref 603–1613)
IgM (Immunoglobulin M), Srm: 19 mg/dL (ref 15–143)
Total Protein ELP: 5.3 g/dL — ABNORMAL LOW (ref 6.0–8.5)

## 2023-12-08 ENCOUNTER — Other Ambulatory Visit: Payer: Self-pay

## 2023-12-08 ENCOUNTER — Encounter: Payer: Self-pay | Admitting: Hematology

## 2023-12-08 DIAGNOSIS — C9000 Multiple myeloma not having achieved remission: Secondary | ICD-10-CM

## 2023-12-09 ENCOUNTER — Inpatient Hospital Stay

## 2023-12-09 VITALS — BP 137/78 | HR 85 | Temp 97.3°F | Resp 18 | Wt 195.0 lb

## 2023-12-09 DIAGNOSIS — C9000 Multiple myeloma not having achieved remission: Secondary | ICD-10-CM

## 2023-12-09 DIAGNOSIS — Z7189 Other specified counseling: Secondary | ICD-10-CM

## 2023-12-09 DIAGNOSIS — Z95828 Presence of other vascular implants and grafts: Secondary | ICD-10-CM

## 2023-12-09 DIAGNOSIS — Z5112 Encounter for antineoplastic immunotherapy: Secondary | ICD-10-CM | POA: Diagnosis not present

## 2023-12-09 LAB — CMP (CANCER CENTER ONLY)
ALT: 17 U/L (ref 0–44)
AST: 21 U/L (ref 15–41)
Albumin: 4 g/dL (ref 3.5–5.0)
Alkaline Phosphatase: 64 U/L (ref 38–126)
Anion gap: 5 (ref 5–15)
BUN: 21 mg/dL (ref 8–23)
CO2: 27 mmol/L (ref 22–32)
Calcium: 9.5 mg/dL (ref 8.9–10.3)
Chloride: 108 mmol/L (ref 98–111)
Creatinine: 1.54 mg/dL — ABNORMAL HIGH (ref 0.61–1.24)
GFR, Estimated: 46 mL/min — ABNORMAL LOW (ref >60)
Glucose, Bld: 129 mg/dL — ABNORMAL HIGH (ref 70–99)
Potassium: 4.1 mmol/L (ref 3.5–5.1)
Sodium: 140 mmol/L (ref 135–145)
Total Bilirubin: 0.7 mg/dL (ref 0.0–1.2)
Total Protein: 5.7 g/dL — ABNORMAL LOW (ref 6.5–8.1)

## 2023-12-09 LAB — CBC WITH DIFFERENTIAL (CANCER CENTER ONLY)
Abs Immature Granulocytes: 0.01 10*3/uL (ref 0.00–0.07)
Basophils Absolute: 0 10*3/uL (ref 0.0–0.1)
Basophils Relative: 1 %
Eosinophils Absolute: 0.1 10*3/uL (ref 0.0–0.5)
Eosinophils Relative: 2 %
HCT: 33.9 % — ABNORMAL LOW (ref 39.0–52.0)
Hemoglobin: 11.7 g/dL — ABNORMAL LOW (ref 13.0–17.0)
Immature Granulocytes: 0 %
Lymphocytes Relative: 20 %
Lymphs Abs: 1 10*3/uL (ref 0.7–4.0)
MCH: 36.2 pg — ABNORMAL HIGH (ref 26.0–34.0)
MCHC: 34.5 g/dL (ref 30.0–36.0)
MCV: 105 fL — ABNORMAL HIGH (ref 80.0–100.0)
Monocytes Absolute: 0.4 10*3/uL (ref 0.1–1.0)
Monocytes Relative: 8 %
Neutro Abs: 3.3 10*3/uL (ref 1.7–7.7)
Neutrophils Relative %: 69 %
Platelet Count: 123 10*3/uL — ABNORMAL LOW (ref 150–400)
RBC: 3.23 MIL/uL — ABNORMAL LOW (ref 4.22–5.81)
RDW: 14 % (ref 11.5–15.5)
WBC Count: 4.8 10*3/uL (ref 4.0–10.5)
nRBC: 0 % (ref 0.0–0.2)

## 2023-12-09 MED ORDER — HEPARIN SOD (PORK) LOCK FLUSH 100 UNIT/ML IV SOLN
500.0000 [IU] | Freq: Once | INTRAVENOUS | Status: AC | PRN
  Administered 2023-12-09: 500 [IU]

## 2023-12-09 MED ORDER — SODIUM CHLORIDE 0.9% FLUSH
10.0000 mL | INTRAVENOUS | Status: DC | PRN
  Administered 2023-12-09: 10 mL

## 2023-12-09 MED ORDER — PROCHLORPERAZINE MALEATE 10 MG PO TABS
10.0000 mg | ORAL_TABLET | Freq: Once | ORAL | Status: AC
  Administered 2023-12-09: 10 mg via ORAL
  Filled 2023-12-09: qty 1

## 2023-12-09 MED ORDER — DEXAMETHASONE SODIUM PHOSPHATE 10 MG/ML IJ SOLN
10.0000 mg | Freq: Once | INTRAMUSCULAR | Status: AC
  Administered 2023-12-09: 10 mg via INTRAVENOUS
  Filled 2023-12-09: qty 1

## 2023-12-09 MED ORDER — DEXTROSE 5 % IV SOLN
56.0000 mg/m2 | Freq: Once | INTRAVENOUS | Status: AC
  Administered 2023-12-09: 120 mg via INTRAVENOUS
  Filled 2023-12-09: qty 60

## 2023-12-09 MED ORDER — ACETAMINOPHEN 500 MG PO TABS
1000.0000 mg | ORAL_TABLET | Freq: Once | ORAL | Status: AC
  Administered 2023-12-09: 1000 mg via ORAL
  Filled 2023-12-09: qty 2

## 2023-12-09 MED ORDER — SODIUM CHLORIDE 0.9 % IV SOLN: INTRAVENOUS | Status: DC

## 2023-12-09 MED ORDER — SODIUM CHLORIDE 0.9% FLUSH
10.0000 mL | Freq: Once | INTRAVENOUS | Status: AC
  Administered 2023-12-09: 10 mL

## 2023-12-09 MED ORDER — SODIUM CHLORIDE 0.9 % IV SOLN
Freq: Once | INTRAVENOUS | Status: AC
Start: 2023-12-09 — End: 2023-12-09

## 2023-12-09 NOTE — Progress Notes (Signed)
 Per Salomon Cree MD, ok to treat with SCR 1.54

## 2023-12-09 NOTE — Patient Instructions (Signed)

## 2023-12-10 ENCOUNTER — Encounter: Payer: Self-pay | Admitting: Hematology

## 2023-12-10 MED ORDER — FENTANYL 12 MCG/HR TD PT72
1.0000 | MEDICATED_PATCH | TRANSDERMAL | 0 refills | Status: DC
Start: 2023-12-10 — End: 2024-01-12

## 2023-12-16 ENCOUNTER — Other Ambulatory Visit: Payer: Self-pay

## 2023-12-16 ENCOUNTER — Encounter: Payer: Self-pay | Admitting: Hematology

## 2023-12-16 DIAGNOSIS — C9000 Multiple myeloma not having achieved remission: Secondary | ICD-10-CM

## 2023-12-16 MED ORDER — ERGOCALCIFEROL 1.25 MG (50000 UT) PO CAPS
50000.0000 [IU] | ORAL_CAPSULE | ORAL | 3 refills | Status: DC
Start: 2023-12-16 — End: 2024-03-10

## 2023-12-23 ENCOUNTER — Inpatient Hospital Stay

## 2023-12-23 ENCOUNTER — Inpatient Hospital Stay: Admitting: Hematology

## 2023-12-23 ENCOUNTER — Inpatient Hospital Stay: Attending: Hematology

## 2023-12-23 VITALS — BP 129/85 | HR 80 | Temp 98.1°F | Resp 20 | Wt 195.3 lb

## 2023-12-23 VITALS — BP 147/91 | HR 77 | Temp 97.9°F | Resp 18

## 2023-12-23 DIAGNOSIS — Z5112 Encounter for antineoplastic immunotherapy: Secondary | ICD-10-CM | POA: Diagnosis present

## 2023-12-23 DIAGNOSIS — C9000 Multiple myeloma not having achieved remission: Secondary | ICD-10-CM

## 2023-12-23 DIAGNOSIS — R350 Frequency of micturition: Secondary | ICD-10-CM | POA: Diagnosis not present

## 2023-12-23 DIAGNOSIS — Z7189 Other specified counseling: Secondary | ICD-10-CM | POA: Diagnosis not present

## 2023-12-23 DIAGNOSIS — Z95828 Presence of other vascular implants and grafts: Secondary | ICD-10-CM

## 2023-12-23 LAB — CBC WITH DIFFERENTIAL (CANCER CENTER ONLY)
Abs Immature Granulocytes: 0.04 10*3/uL (ref 0.00–0.07)
Basophils Absolute: 0 10*3/uL (ref 0.0–0.1)
Basophils Relative: 0 %
Eosinophils Absolute: 0.1 10*3/uL (ref 0.0–0.5)
Eosinophils Relative: 1 %
HCT: 35 % — ABNORMAL LOW (ref 39.0–52.0)
Hemoglobin: 12.2 g/dL — ABNORMAL LOW (ref 13.0–17.0)
Immature Granulocytes: 1 %
Lymphocytes Relative: 13 %
Lymphs Abs: 1 10*3/uL (ref 0.7–4.0)
MCH: 36.4 pg — ABNORMAL HIGH (ref 26.0–34.0)
MCHC: 34.9 g/dL (ref 30.0–36.0)
MCV: 104.5 fL — ABNORMAL HIGH (ref 80.0–100.0)
Monocytes Absolute: 0.5 10*3/uL (ref 0.1–1.0)
Monocytes Relative: 7 %
Neutro Abs: 6.2 10*3/uL (ref 1.7–7.7)
Neutrophils Relative %: 78 %
Platelet Count: 160 10*3/uL (ref 150–400)
RBC: 3.35 MIL/uL — ABNORMAL LOW (ref 4.22–5.81)
RDW: 14.2 % (ref 11.5–15.5)
WBC Count: 7.9 10*3/uL (ref 4.0–10.5)
nRBC: 0 % (ref 0.0–0.2)

## 2023-12-23 LAB — CMP (CANCER CENTER ONLY)
ALT: 12 U/L (ref 0–44)
AST: 17 U/L (ref 15–41)
Albumin: 4.1 g/dL (ref 3.5–5.0)
Alkaline Phosphatase: 61 U/L (ref 38–126)
Anion gap: 5 (ref 5–15)
BUN: 21 mg/dL (ref 8–23)
CO2: 28 mmol/L (ref 22–32)
Calcium: 9.8 mg/dL (ref 8.9–10.3)
Chloride: 108 mmol/L (ref 98–111)
Creatinine: 1.7 mg/dL — ABNORMAL HIGH (ref 0.61–1.24)
GFR, Estimated: 41 mL/min — ABNORMAL LOW (ref >60)
Glucose, Bld: 145 mg/dL — ABNORMAL HIGH (ref 70–99)
Potassium: 4.3 mmol/L (ref 3.5–5.1)
Sodium: 141 mmol/L (ref 135–145)
Total Bilirubin: 0.8 mg/dL (ref 0.0–1.2)
Total Protein: 6 g/dL — ABNORMAL LOW (ref 6.5–8.1)

## 2023-12-23 MED ORDER — DEXTROSE 5 % IV SOLN
56.0000 mg/m2 | Freq: Once | INTRAVENOUS | Status: AC
  Administered 2023-12-23: 120 mg via INTRAVENOUS
  Filled 2023-12-23: qty 60

## 2023-12-23 MED ORDER — DEXAMETHASONE SODIUM PHOSPHATE 10 MG/ML IJ SOLN
10.0000 mg | Freq: Once | INTRAMUSCULAR | Status: AC
  Administered 2023-12-23: 10 mg via INTRAVENOUS
  Filled 2023-12-23: qty 1

## 2023-12-23 MED ORDER — SODIUM CHLORIDE 0.9 % IV SOLN: INTRAVENOUS | Status: DC

## 2023-12-23 MED ORDER — SODIUM CHLORIDE 0.9% FLUSH
10.0000 mL | Freq: Once | INTRAVENOUS | Status: AC
  Administered 2023-12-23: 10 mL

## 2023-12-23 MED ORDER — HEPARIN SOD (PORK) LOCK FLUSH 100 UNIT/ML IV SOLN
500.0000 [IU] | Freq: Once | INTRAVENOUS | Status: AC | PRN
  Administered 2023-12-23: 500 [IU]

## 2023-12-23 MED ORDER — SODIUM CHLORIDE 0.9% FLUSH
10.0000 mL | INTRAVENOUS | Status: DC | PRN
  Administered 2023-12-23: 10 mL

## 2023-12-23 MED ORDER — PROCHLORPERAZINE MALEATE 10 MG PO TABS
10.0000 mg | ORAL_TABLET | Freq: Once | ORAL | Status: AC
  Administered 2023-12-23: 10 mg via ORAL
  Filled 2023-12-23: qty 1

## 2023-12-23 MED ORDER — SODIUM CHLORIDE 0.9 % IV SOLN: Freq: Once | INTRAVENOUS | Status: AC

## 2023-12-23 MED ORDER — ACETAMINOPHEN 500 MG PO TABS
1000.0000 mg | ORAL_TABLET | Freq: Once | ORAL | Status: AC
  Administered 2023-12-23: 1000 mg via ORAL
  Filled 2023-12-23: qty 2

## 2023-12-23 NOTE — Progress Notes (Signed)
 Patient seen by Dr. Minnie Amber are within treatment parameters.  Labs reviewed: and are not all within treatment parameters.   CR: 1.70  Per physician team, patient is ready for treatment and there are NO modifications to the treatment plan.

## 2023-12-23 NOTE — Patient Instructions (Signed)
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
## 2023-12-25 ENCOUNTER — Other Ambulatory Visit: Payer: Self-pay

## 2023-12-29 ENCOUNTER — Encounter: Payer: Self-pay | Admitting: Hematology

## 2024-01-03 ENCOUNTER — Encounter: Payer: Self-pay | Admitting: Hematology

## 2024-01-05 ENCOUNTER — Other Ambulatory Visit: Payer: Self-pay

## 2024-01-05 DIAGNOSIS — C9000 Multiple myeloma not having achieved remission: Secondary | ICD-10-CM

## 2024-01-05 MED ORDER — TIZANIDINE HCL 2 MG PO CAPS
2.0000 mg | ORAL_CAPSULE | Freq: Three times a day (TID) | ORAL | 0 refills | Status: DC | PRN
Start: 2024-01-05 — End: 2024-02-16

## 2024-01-06 ENCOUNTER — Inpatient Hospital Stay

## 2024-01-06 VITALS — BP 114/80 | HR 79 | Temp 98.2°F | Resp 16 | Wt 198.2 lb

## 2024-01-06 DIAGNOSIS — Z7189 Other specified counseling: Secondary | ICD-10-CM

## 2024-01-06 DIAGNOSIS — C9000 Multiple myeloma not having achieved remission: Secondary | ICD-10-CM

## 2024-01-06 DIAGNOSIS — Z5112 Encounter for antineoplastic immunotherapy: Secondary | ICD-10-CM | POA: Diagnosis not present

## 2024-01-06 LAB — CMP (CANCER CENTER ONLY)
ALT: 17 U/L (ref 0–44)
AST: 19 U/L (ref 15–41)
Albumin: 4.1 g/dL (ref 3.5–5.0)
Alkaline Phosphatase: 69 U/L (ref 38–126)
Anion gap: 6 (ref 5–15)
BUN: 22 mg/dL (ref 8–23)
CO2: 29 mmol/L (ref 22–32)
Calcium: 9.7 mg/dL (ref 8.9–10.3)
Chloride: 106 mmol/L (ref 98–111)
Creatinine: 1.6 mg/dL — ABNORMAL HIGH (ref 0.61–1.24)
GFR, Estimated: 44 mL/min — ABNORMAL LOW (ref >60)
Glucose, Bld: 96 mg/dL (ref 70–99)
Potassium: 4.1 mmol/L (ref 3.5–5.1)
Sodium: 141 mmol/L (ref 135–145)
Total Bilirubin: 0.7 mg/dL (ref 0.0–1.2)
Total Protein: 6 g/dL — ABNORMAL LOW (ref 6.5–8.1)

## 2024-01-06 LAB — CBC WITH DIFFERENTIAL (CANCER CENTER ONLY)
Abs Immature Granulocytes: 0.01 10*3/uL (ref 0.00–0.07)
Basophils Absolute: 0 10*3/uL (ref 0.0–0.1)
Basophils Relative: 0 %
Eosinophils Absolute: 0.1 10*3/uL (ref 0.0–0.5)
Eosinophils Relative: 2 %
HCT: 35 % — ABNORMAL LOW (ref 39.0–52.0)
Hemoglobin: 11.9 g/dL — ABNORMAL LOW (ref 13.0–17.0)
Immature Granulocytes: 0 %
Lymphocytes Relative: 20 %
Lymphs Abs: 1.1 10*3/uL (ref 0.7–4.0)
MCH: 36.1 pg — ABNORMAL HIGH (ref 26.0–34.0)
MCHC: 34 g/dL (ref 30.0–36.0)
MCV: 106.1 fL — ABNORMAL HIGH (ref 80.0–100.0)
Monocytes Absolute: 0.5 10*3/uL (ref 0.1–1.0)
Monocytes Relative: 8 %
Neutro Abs: 3.8 10*3/uL (ref 1.7–7.7)
Neutrophils Relative %: 70 %
Platelet Count: 154 10*3/uL (ref 150–400)
RBC: 3.3 MIL/uL — ABNORMAL LOW (ref 4.22–5.81)
RDW: 14.3 % (ref 11.5–15.5)
WBC Count: 5.5 10*3/uL (ref 4.0–10.5)
nRBC: 0 % (ref 0.0–0.2)

## 2024-01-06 MED ORDER — PROCHLORPERAZINE MALEATE 10 MG PO TABS
10.0000 mg | ORAL_TABLET | Freq: Once | ORAL | Status: AC
  Administered 2024-01-06: 10 mg via ORAL
  Filled 2024-01-06: qty 1

## 2024-01-06 MED ORDER — SODIUM CHLORIDE 0.9 % IV SOLN: Freq: Once | INTRAVENOUS | Status: AC

## 2024-01-06 MED ORDER — ACETAMINOPHEN 500 MG PO TABS
1000.0000 mg | ORAL_TABLET | Freq: Once | ORAL | Status: AC
  Administered 2024-01-06: 1000 mg via ORAL
  Filled 2024-01-06: qty 2

## 2024-01-06 MED ORDER — DEXAMETHASONE SODIUM PHOSPHATE 10 MG/ML IJ SOLN
10.0000 mg | Freq: Once | INTRAMUSCULAR | Status: AC
  Administered 2024-01-06: 10 mg via INTRAVENOUS
  Filled 2024-01-06: qty 1

## 2024-01-06 MED ORDER — DEXTROSE 5 % IV SOLN
56.0000 mg/m2 | Freq: Once | INTRAVENOUS | Status: AC
  Administered 2024-01-06: 120 mg via INTRAVENOUS
  Filled 2024-01-06: qty 60

## 2024-01-06 NOTE — Progress Notes (Signed)
 Per Wyline Hearing, PA, ok to treat with elevated creatinine

## 2024-01-06 NOTE — Patient Instructions (Signed)

## 2024-01-06 NOTE — Patient Instructions (Signed)
 [facility] - A DEPT OF [facility]  Discharge Instructions: Thank you for choosing [facility] to provide your oncology and hematology care.   If you have a lab appointment with the Cancer Center, please go directly to the Cancer Center and check in at the registration area.   Wear comfortable clothing and clothing appropriate for easy access to any Portacath or PICC line.   We strive to give you quality time with your provider. You may need to reschedule your appointment if you arrive late (15 or more minutes).  Arriving late affects you and other patients whose appointments are after yours.  Also, if you miss three or more appointments without notifying the office, you may be dismissed from the clinic at the provider's discretion.      For prescription refill requests, have your pharmacy contact our office and allow 72 hours for refills to be completed.    Today you received the following chemotherapy and/or immunotherapy agents kyprolis      To help prevent nausea and vomiting after your treatment, we encourage you to take your nausea medication as directed.  BELOW ARE SYMPTOMS THAT SHOULD BE REPORTED IMMEDIATELY: *FEVER GREATER THAN 100.4 F (38 C) OR HIGHER *CHILLS OR SWEATING *NAUSEA AND VOMITING THAT IS NOT CONTROLLED WITH YOUR NAUSEA MEDICATION *UNUSUAL SHORTNESS OF BREATH *UNUSUAL BRUISING OR BLEEDING *URINARY PROBLEMS (pain or burning when urinating, or frequent urination) *BOWEL PROBLEMS (unusual diarrhea, constipation, pain near the anus) TENDERNESS IN MOUTH AND THROAT WITH OR WITHOUT PRESENCE OF ULCERS (sore throat, sores in mouth, or a toothache) UNUSUAL RASH, SWELLING OR PAIN  UNUSUAL VAGINAL DISCHARGE OR ITCHING   Items with * indicate a potential emergency and should be followed up as soon as possible or go to the Emergency Department if any problems should occur.  Please show the CHEMOTHERAPY ALERT CARD or IMMUNOTHERAPY  ALERT CARD at check-in to the Emergency Department and triage nurse.  Should you have questions after your visit or need to cancel or reschedule your appointment, please contact [facility] - A DEPT OF [facility]  Dept: [phone]  and follow the prompts.  Office hours are 8:00 a.m. to 4:30 p.m. Monday - Friday. Please note that voicemails left after 4:00 p.m. may not be returned until the following business day.  We are closed weekends and major holidays. You have access to a nurse at all times for urgent questions. Please call the main number to the clinic Dept: [phone] and follow the prompts.   For any non-urgent questions, you may also contact your provider using MyChart. We now offer e-Visits for anyone 80 and older to request care online for non-urgent symptoms. For details visit [email or web address].   Also download the MyChart app! Go to the app store, search "MyChart", open the app, select [facility], and log in with your MyChart username and password.

## 2024-01-10 ENCOUNTER — Encounter: Payer: Self-pay | Admitting: Hematology

## 2024-01-12 ENCOUNTER — Other Ambulatory Visit: Payer: Self-pay

## 2024-01-12 DIAGNOSIS — C9000 Multiple myeloma not having achieved remission: Secondary | ICD-10-CM

## 2024-01-12 DIAGNOSIS — C9001 Multiple myeloma in remission: Secondary | ICD-10-CM

## 2024-01-13 ENCOUNTER — Encounter: Payer: Self-pay | Admitting: Hematology

## 2024-01-13 ENCOUNTER — Other Ambulatory Visit: Payer: Self-pay | Admitting: Hematology

## 2024-01-13 DIAGNOSIS — C9001 Multiple myeloma in remission: Secondary | ICD-10-CM

## 2024-01-13 MED ORDER — TRAZODONE HCL 50 MG PO TABS: 50.0000 mg | ORAL_TABLET | Freq: Every evening | ORAL | 1 refills | Status: DC | PRN

## 2024-01-13 MED ORDER — FENTANYL 12 MCG/HR TD PT72: 1.0000 | MEDICATED_PATCH | TRANSDERMAL | 0 refills | Status: DC

## 2024-01-14 ENCOUNTER — Other Ambulatory Visit: Payer: Self-pay

## 2024-01-15 ENCOUNTER — Encounter: Payer: Self-pay | Admitting: Hematology

## 2024-01-20 ENCOUNTER — Inpatient Hospital Stay: Attending: Hematology

## 2024-01-20 ENCOUNTER — Inpatient Hospital Stay

## 2024-01-20 ENCOUNTER — Encounter: Payer: Self-pay | Admitting: Hematology

## 2024-01-20 VITALS — BP 133/88 | HR 79 | Temp 98.2°F | Resp 18 | Wt 193.2 lb

## 2024-01-20 DIAGNOSIS — C9001 Multiple myeloma in remission: Secondary | ICD-10-CM | POA: Insufficient documentation

## 2024-01-20 DIAGNOSIS — Z5112 Encounter for antineoplastic immunotherapy: Secondary | ICD-10-CM | POA: Insufficient documentation

## 2024-01-20 DIAGNOSIS — C9000 Multiple myeloma not having achieved remission: Secondary | ICD-10-CM

## 2024-01-20 DIAGNOSIS — Z7189 Other specified counseling: Secondary | ICD-10-CM

## 2024-01-20 DIAGNOSIS — Z95828 Presence of other vascular implants and grafts: Secondary | ICD-10-CM

## 2024-01-20 LAB — CMP (CANCER CENTER ONLY)
ALT: 16 U/L (ref 0–44)
AST: 16 U/L (ref 15–41)
Albumin: 4 g/dL (ref 3.5–5.0)
Alkaline Phosphatase: 60 U/L (ref 38–126)
Anion gap: 6 (ref 5–15)
BUN: 21 mg/dL (ref 8–23)
CO2: 28 mmol/L (ref 22–32)
Calcium: 9.5 mg/dL (ref 8.9–10.3)
Chloride: 105 mmol/L (ref 98–111)
Creatinine: 1.56 mg/dL — ABNORMAL HIGH (ref 0.61–1.24)
GFR, Estimated: 45 mL/min — ABNORMAL LOW (ref >60)
Glucose, Bld: 104 mg/dL — ABNORMAL HIGH (ref 70–99)
Potassium: 4.3 mmol/L (ref 3.5–5.1)
Sodium: 139 mmol/L (ref 135–145)
Total Bilirubin: 0.8 mg/dL (ref 0.0–1.2)
Total Protein: 6 g/dL — ABNORMAL LOW (ref 6.5–8.1)

## 2024-01-20 LAB — CBC WITH DIFFERENTIAL (CANCER CENTER ONLY)
Abs Immature Granulocytes: 0.01 10*3/uL (ref 0.00–0.07)
Basophils Absolute: 0.1 10*3/uL (ref 0.0–0.1)
Basophils Relative: 1 %
Eosinophils Absolute: 0.1 10*3/uL (ref 0.0–0.5)
Eosinophils Relative: 2 %
HCT: 32.8 % — ABNORMAL LOW (ref 39.0–52.0)
Hemoglobin: 11.3 g/dL — ABNORMAL LOW (ref 13.0–17.0)
Immature Granulocytes: 0 %
Lymphocytes Relative: 18 %
Lymphs Abs: 1 10*3/uL (ref 0.7–4.0)
MCH: 36.3 pg — ABNORMAL HIGH (ref 26.0–34.0)
MCHC: 34.5 g/dL (ref 30.0–36.0)
MCV: 105.5 fL — ABNORMAL HIGH (ref 80.0–100.0)
Monocytes Absolute: 0.4 10*3/uL (ref 0.1–1.0)
Monocytes Relative: 8 %
Neutro Abs: 3.7 10*3/uL (ref 1.7–7.7)
Neutrophils Relative %: 71 %
Platelet Count: 169 10*3/uL (ref 150–400)
RBC: 3.11 MIL/uL — ABNORMAL LOW (ref 4.22–5.81)
RDW: 13.9 % (ref 11.5–15.5)
WBC Count: 5.3 10*3/uL (ref 4.0–10.5)
nRBC: 0 % (ref 0.0–0.2)

## 2024-01-20 MED ORDER — SODIUM CHLORIDE 0.9% FLUSH
10.0000 mL | Freq: Once | INTRAVENOUS | Status: AC
  Administered 2024-01-20: 10 mL

## 2024-01-20 MED ORDER — DEXTROSE 5 % IV SOLN
56.0000 mg/m2 | Freq: Once | INTRAVENOUS | Status: AC
  Administered 2024-01-20: 120 mg via INTRAVENOUS
  Filled 2024-01-20: qty 60

## 2024-01-20 MED ORDER — PROCHLORPERAZINE MALEATE 10 MG PO TABS
10.0000 mg | ORAL_TABLET | Freq: Once | ORAL | Status: AC
  Administered 2024-01-20: 10 mg via ORAL
  Filled 2024-01-20: qty 1

## 2024-01-20 MED ORDER — SODIUM CHLORIDE 0.9 % IV SOLN: Freq: Once | INTRAVENOUS | Status: AC

## 2024-01-20 MED ORDER — SODIUM CHLORIDE 0.9 % IV SOLN: INTRAVENOUS | Status: DC

## 2024-01-20 MED ORDER — DEXAMETHASONE SODIUM PHOSPHATE 10 MG/ML IJ SOLN
10.0000 mg | Freq: Once | INTRAMUSCULAR | Status: AC
  Administered 2024-01-20: 10 mg via INTRAVENOUS
  Filled 2024-01-20: qty 1

## 2024-01-20 MED ORDER — ACETAMINOPHEN 500 MG PO TABS
1000.0000 mg | ORAL_TABLET | Freq: Once | ORAL | Status: AC
  Administered 2024-01-20: 1000 mg via ORAL
  Filled 2024-01-20: qty 2

## 2024-01-20 NOTE — Patient Instructions (Signed)

## 2024-01-21 ENCOUNTER — Other Ambulatory Visit: Payer: Self-pay | Admitting: Internal Medicine

## 2024-01-27 LAB — MULTIPLE MYELOMA PANEL, SERUM
Albumin SerPl Elph-Mcnc: 3.7 g/dL (ref 2.9–4.4)
Albumin/Glob SerPl: 1.8 — ABNORMAL HIGH (ref 0.7–1.7)
Alpha 1: 0.3 g/dL (ref 0.0–0.4)
Alpha2 Glob SerPl Elph-Mcnc: 0.7 g/dL (ref 0.4–1.0)
B-Globulin SerPl Elph-Mcnc: 0.8 g/dL (ref 0.7–1.3)
Gamma Glob SerPl Elph-Mcnc: 0.3 g/dL — ABNORMAL LOW (ref 0.4–1.8)
Globulin, Total: 2.1 g/dL — ABNORMAL LOW (ref 2.2–3.9)
IgA: 20 mg/dL — ABNORMAL LOW (ref 61–437)
IgG (Immunoglobin G), Serum: 254 mg/dL — ABNORMAL LOW (ref 603–1613)
IgM (Immunoglobulin M), Srm: 20 mg/dL (ref 15–143)
Total Protein ELP: 5.8 g/dL — ABNORMAL LOW (ref 6.0–8.5)

## 2024-02-01 ENCOUNTER — Other Ambulatory Visit: Payer: Self-pay | Admitting: Hematology

## 2024-02-01 DIAGNOSIS — C9000 Multiple myeloma not having achieved remission: Secondary | ICD-10-CM

## 2024-02-02 ENCOUNTER — Encounter: Payer: Self-pay | Admitting: Hematology

## 2024-02-03 ENCOUNTER — Inpatient Hospital Stay

## 2024-02-03 VITALS — BP 109/64 | HR 65 | Temp 98.6°F | Resp 16 | Wt 192.5 lb

## 2024-02-03 DIAGNOSIS — C9000 Multiple myeloma not having achieved remission: Secondary | ICD-10-CM

## 2024-02-03 DIAGNOSIS — Z95828 Presence of other vascular implants and grafts: Secondary | ICD-10-CM

## 2024-02-03 DIAGNOSIS — Z7189 Other specified counseling: Secondary | ICD-10-CM

## 2024-02-03 DIAGNOSIS — Z5112 Encounter for antineoplastic immunotherapy: Secondary | ICD-10-CM | POA: Diagnosis not present

## 2024-02-03 LAB — CBC WITH DIFFERENTIAL (CANCER CENTER ONLY)
Abs Immature Granulocytes: 0 K/uL (ref 0.00–0.07)
Basophils Absolute: 0 K/uL (ref 0.0–0.1)
Basophils Relative: 1 %
Eosinophils Absolute: 0.1 K/uL (ref 0.0–0.5)
Eosinophils Relative: 2 %
HCT: 32.5 % — ABNORMAL LOW (ref 39.0–52.0)
Hemoglobin: 11.3 g/dL — ABNORMAL LOW (ref 13.0–17.0)
Immature Granulocytes: 0 %
Lymphocytes Relative: 23 %
Lymphs Abs: 1.1 K/uL (ref 0.7–4.0)
MCH: 37 pg — ABNORMAL HIGH (ref 26.0–34.0)
MCHC: 34.8 g/dL (ref 30.0–36.0)
MCV: 106.6 fL — ABNORMAL HIGH (ref 80.0–100.0)
Monocytes Absolute: 0.4 K/uL (ref 0.1–1.0)
Monocytes Relative: 8 %
Neutro Abs: 3.1 K/uL (ref 1.7–7.7)
Neutrophils Relative %: 66 %
Platelet Count: 183 K/uL (ref 150–400)
RBC: 3.05 MIL/uL — ABNORMAL LOW (ref 4.22–5.81)
RDW: 13.6 % (ref 11.5–15.5)
WBC Count: 4.7 K/uL (ref 4.0–10.5)
nRBC: 0 % (ref 0.0–0.2)

## 2024-02-03 LAB — CMP (CANCER CENTER ONLY)
ALT: 15 U/L (ref 0–44)
AST: 23 U/L (ref 15–41)
Albumin: 3.8 g/dL (ref 3.5–5.0)
Alkaline Phosphatase: 72 U/L (ref 38–126)
Anion gap: 5 (ref 5–15)
BUN: 21 mg/dL (ref 8–23)
CO2: 28 mmol/L (ref 22–32)
Calcium: 9.5 mg/dL (ref 8.9–10.3)
Chloride: 107 mmol/L (ref 98–111)
Creatinine: 1.62 mg/dL — ABNORMAL HIGH (ref 0.61–1.24)
GFR, Estimated: 43 mL/min — ABNORMAL LOW (ref >60)
Glucose, Bld: 145 mg/dL — ABNORMAL HIGH (ref 70–99)
Potassium: 4.4 mmol/L (ref 3.5–5.1)
Sodium: 140 mmol/L (ref 135–145)
Total Bilirubin: 0.7 mg/dL (ref 0.0–1.2)
Total Protein: 5.7 g/dL — ABNORMAL LOW (ref 6.5–8.1)

## 2024-02-03 MED ORDER — PROCHLORPERAZINE MALEATE 10 MG PO TABS
10.0000 mg | ORAL_TABLET | Freq: Once | ORAL | Status: AC
  Administered 2024-02-03: 10 mg via ORAL
  Filled 2024-02-03: qty 1

## 2024-02-03 MED ORDER — SODIUM CHLORIDE 0.9% FLUSH
10.0000 mL | INTRAVENOUS | Status: DC | PRN
  Administered 2024-02-03: 10 mL

## 2024-02-03 MED ORDER — SODIUM CHLORIDE 0.9 % IV SOLN: Freq: Once | INTRAVENOUS | Status: AC

## 2024-02-03 MED ORDER — ACETAMINOPHEN 500 MG PO TABS
1000.0000 mg | ORAL_TABLET | Freq: Once | ORAL | Status: AC
  Administered 2024-02-03: 1000 mg via ORAL
  Filled 2024-02-03: qty 2

## 2024-02-03 MED ORDER — HEPARIN SOD (PORK) LOCK FLUSH 100 UNIT/ML IV SOLN
500.0000 [IU] | Freq: Once | INTRAVENOUS | Status: AC | PRN
  Administered 2024-02-03: 500 [IU]

## 2024-02-03 MED ORDER — DEXAMETHASONE SODIUM PHOSPHATE 10 MG/ML IJ SOLN
10.0000 mg | Freq: Once | INTRAMUSCULAR | Status: AC
  Administered 2024-02-03: 10 mg via INTRAVENOUS
  Filled 2024-02-03: qty 1

## 2024-02-03 MED ORDER — SODIUM CHLORIDE 0.9% FLUSH
10.0000 mL | Freq: Once | INTRAVENOUS | Status: AC
  Administered 2024-02-03: 10 mL

## 2024-02-03 MED ORDER — SODIUM CHLORIDE 0.9 % IV SOLN
INTRAVENOUS | Status: DC
Start: 2024-02-03 — End: 2024-02-03

## 2024-02-03 MED ORDER — DEXTROSE 5 % IV SOLN
56.0000 mg/m2 | Freq: Once | INTRAVENOUS | Status: AC
  Administered 2024-02-03: 120 mg via INTRAVENOUS
  Filled 2024-02-03: qty 60

## 2024-02-03 NOTE — Progress Notes (Signed)
 Per Onesimo MD, ok to treat with SCR .162

## 2024-02-03 NOTE — Patient Instructions (Signed)

## 2024-02-09 ENCOUNTER — Other Ambulatory Visit: Payer: Self-pay

## 2024-02-09 ENCOUNTER — Ambulatory Visit: Admitting: Internal Medicine

## 2024-02-09 ENCOUNTER — Encounter: Payer: Self-pay | Admitting: Internal Medicine

## 2024-02-09 ENCOUNTER — Encounter: Payer: Self-pay | Admitting: Hematology

## 2024-02-09 VITALS — BP 120/80 | HR 80 | Temp 98.6°F | Ht 70.0 in | Wt 191.5 lb

## 2024-02-09 DIAGNOSIS — Z9484 Stem cells transplant status: Secondary | ICD-10-CM | POA: Diagnosis not present

## 2024-02-09 DIAGNOSIS — C9000 Multiple myeloma not having achieved remission: Secondary | ICD-10-CM

## 2024-02-09 DIAGNOSIS — W19XXXA Unspecified fall, initial encounter: Secondary | ICD-10-CM

## 2024-02-09 DIAGNOSIS — Y92009 Unspecified place in unspecified non-institutional (private) residence as the place of occurrence of the external cause: Secondary | ICD-10-CM

## 2024-02-09 DIAGNOSIS — S80811A Abrasion, right lower leg, initial encounter: Secondary | ICD-10-CM

## 2024-02-09 DIAGNOSIS — Z8579 Personal history of other malignant neoplasms of lymphoid, hematopoietic and related tissues: Secondary | ICD-10-CM

## 2024-02-09 MED ORDER — AMOXICILLIN-POT CLAVULANATE 500-125 MG PO TABS: 1.0000 | ORAL_TABLET | Freq: Three times a day (TID) | ORAL | 0 refills | Status: AC

## 2024-02-09 NOTE — Progress Notes (Signed)
 Patient Care Team: Perri Ronal PARAS, MD as PCP - General (Internal Medicine) Alvia Norleen BIRCH, MD as Consulting Physician (Ophthalmology)  Visit Date: 02/09/24  Subjective:   Chief Complaint  Patient presents with   Wound Check    Patient fell on steps about 3 weeks ago and hit his right leg.    Patient PI:Wesley ROOK MAUE Jr.,[name] DOB:Jul 08, 1946,78 y.o. FMW:[id number]   78 y.o.[name] presents today for acute  visit with skin laceration Right Leg Secondary to Fall x3 weeks. Patient has a past medical history of HTN; Multiple Myeloma; Anticoagulation Therapy with Eliquis . UTD on Tdap since 2023. Says that he'd stumbled and fell onto stairs, ending up with several injuries/bruises; he has been keeping the wounds bandaged and cleaning them with soap and water once every other day. Hx of stem cell transplant for Myeloma.  Past Medical History:  Diagnosis Date   Arthritis    Blood transfusion without reported diagnosis 1969   BPH (benign prostatic hyperplasia)    Cataract    x2   Chronic cough    CKD (chronic kidney disease)    Colon polyp    2 adenomas2004, max 7 mm   Depression    Detached retina 2012   Dr. 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  He does not smoke.  Occasional alcohol consumption.  He is married.  This is his second marriage.  No children from second marriage.  Wife has multiple sclerosis.  He has an adult son in good health.        Family history: Father died of lung cancer at age 57 with history of MI.  2 sisters in good health.       Review of Systems  Constitutional:  Negative for fever.  Musculoskeletal:  Positive for falls (3 weeks ago).  Skin:  Negative for itching.       (+) Wound, Right Lower Leg (+) Bruising, Left-side Ribcage and Lateral Left Hip (+) Abrasions/Excoriations, multiple sites  All other systems reviewed and are negative.    Objective:  Vitals: BP 120/80   Pulse 80   Temp 98.6 F (37 C)   Ht 5' 10 (1.778 m)   Wt 191 lb 8 oz (86.9 kg)   SpO2 95%   BMI 27.48 kg/m   Physical Exam Vitals and nursing note reviewed.  Constitutional:      General: He is not in acute distress.    Appearance: Normal appearance. He is not ill-appearing.  HENT:     Head: Normocephalic and atraumatic.  Pulmonary:     Effort: Pulmonary effort is normal.  Skin:    General: Skin is warm and dry.  Comments:    Neurological:     Mental Status: He is alert and oriented to person, place, and time. Mental status is at baseline.  Psychiatric:        Mood and Affect: Mood normal.        Behavior: Behavior normal.        Thought Content: Thought content normal.        Judgment: Judgment normal.     Results:  Studies Obtained And Personally Reviewed By Me: Labs:     Component Value Date/Time   NA 140 02/03/2024 1154   K 4.4 02/03/2024 1154   CL 107 02/03/2024 1154   CO2 28 02/03/2024 1154   GLUCOSE 145 (H) 02/03/2024 1154   BUN 21 02/03/2024 1154   CREATININE 1.62 (H) 02/03/2024 1154   CREATININE 1.92 (H) 07/10/2023 0950   CALCIUM 9.5 02/03/2024 1154   PROT 5.7 (L) 02/03/2024 1154   ALBUMIN 3.8 02/03/2024 1154   AST 23 02/03/2024 1154   ALT 15 02/03/2024 1154   ALKPHOS 72 02/03/2024 1154   BILITOT 0.7 02/03/2024 1154   GFRNONAA 43 (L) 02/03/2024 1154   GFRNONAA 49 (L) 06/28/2019 1218   GFRAA >60 04/11/2020 1117   GFRAA 56 (L) 06/28/2019 1218    Lab Results  Component Value  Date   WBC 4.7 02/03/2024   HGB 11.3 (L) 02/03/2024   HCT 32.5 (L) 02/03/2024   MCV 106.6 (H) 02/03/2024   PLT 183 02/03/2024   Lab Results  Component Value Date   CHOL 174 07/10/2023   HDL 69 07/10/2023   LDLCALC 86 07/10/2023   TRIG 99 07/10/2023   CHOLHDL 2.5 07/10/2023   Lab Results  Component Value Date   HGBA1C 5.9 (H) 08/21/2012    Lab Results  Component Value Date   TSH 2.72 06/28/2019    Lab Results  Component Value Date   PSA 0.45 07/10/2023   PSA 0.55 06/28/2022   PSA 0.69 06/22/2021     Assessment & Plan:   Wound, Right Leg Secondary to Fall x3 weeks: past medical history Multiple Myeloma s/p stem cell transplant,Anticoagulation Therapy with Eliquis . UTD on Tdap since 2023. Apparently 3 weeks ago, he stumbled and fell onto stairs, ending up with several injuries/bruises. He has been keeping the wounds bandaged and cleaning them with soap and water once every other day. On exam visualized circular wound on right anterior lower leg, which does have some surrounding erythema. Sending in Augmentin  500-125 mg - take 1 capsule (500 mg total) by mouth 3 (three) times daily for 7 days. Wounds cleaned, topical ointment applied, and dressed with Telfa and paper tape in office. Contact us  if symptoms worsen/persist despite treatment.   Myeloma dx 2020. Had stem cell transplant Sept 2021  Receives Carfilzomib  every other week started in January 2022.  Recently vaccinated by Oncology for MMR  CKD Stage III   Plan: Dress wounds daily at home until healed. May use Mupirocin  ointment topically on these wounds. Received Tdap 2023.     I,Emily Lagle,acting as a Neurosurgeon for Ronal JINNY Hailstone, MD.,have documented all relevant documentation on the behalf of Ronal JINNY Hailstone, MD,as directed by  Ronal JINNY Hailstone, MD while in the presence of Ronal JINNY Hailstone, MD.   I, Ronal JINNY Hailstone, MD, have reviewed all documentation for this visit. The documentation on 02/09/24 for the exam, diagnosis,  procedures, and orders are all accurate and complete.

## 2024-02-09 NOTE — Patient Instructions (Addendum)
 Take Augmentin   3 times daily for 7 days as prescribed for wound right anterior lower leg. Dress with mupirocin  and keep covered until healed. Tetanus vaccine is up to date.Call back if not healing well within 5-7 days or sooner if worse.

## 2024-02-10 ENCOUNTER — Encounter: Payer: Self-pay | Admitting: Hematology

## 2024-02-10 MED ORDER — FENTANYL 12 MCG/HR TD PT72: 1.0000 | MEDICATED_PATCH | TRANSDERMAL | 0 refills | Status: DC

## 2024-02-16 ENCOUNTER — Encounter: Payer: Self-pay | Admitting: Hematology

## 2024-02-16 ENCOUNTER — Other Ambulatory Visit: Payer: Self-pay

## 2024-02-16 DIAGNOSIS — C9000 Multiple myeloma not having achieved remission: Secondary | ICD-10-CM

## 2024-02-16 DIAGNOSIS — C9001 Multiple myeloma in remission: Secondary | ICD-10-CM

## 2024-02-16 MED ORDER — TRAMADOL HCL 50 MG PO TABS: 50.0000 mg | ORAL_TABLET | Freq: Four times a day (QID) | ORAL | 0 refills | Status: DC | PRN

## 2024-02-16 MED ORDER — TIZANIDINE HCL 2 MG PO CAPS: 2.0000 mg | ORAL_CAPSULE | Freq: Three times a day (TID) | ORAL | 0 refills | Status: DC | PRN
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
## 2024-02-17 ENCOUNTER — Inpatient Hospital Stay

## 2024-02-17 ENCOUNTER — Inpatient Hospital Stay: Admitting: Hematology

## 2024-02-17 VITALS — BP 120/81 | HR 63 | Temp 97.9°F | Resp 20 | Wt 193.2 lb

## 2024-02-17 DIAGNOSIS — Z5111 Encounter for antineoplastic chemotherapy: Secondary | ICD-10-CM

## 2024-02-17 DIAGNOSIS — C9001 Multiple myeloma in remission: Secondary | ICD-10-CM

## 2024-02-17 DIAGNOSIS — Z95828 Presence of other vascular implants and grafts: Secondary | ICD-10-CM

## 2024-02-17 DIAGNOSIS — Z7189 Other specified counseling: Secondary | ICD-10-CM

## 2024-02-17 DIAGNOSIS — Z5112 Encounter for antineoplastic immunotherapy: Secondary | ICD-10-CM | POA: Diagnosis not present

## 2024-02-17 DIAGNOSIS — C9000 Multiple myeloma not having achieved remission: Secondary | ICD-10-CM

## 2024-02-17 LAB — CMP (CANCER CENTER ONLY)
ALT: 14 U/L (ref 0–44)
AST: 21 U/L (ref 15–41)
Albumin: 3.8 g/dL (ref 3.5–5.0)
Alkaline Phosphatase: 68 U/L (ref 38–126)
Anion gap: 4 — ABNORMAL LOW (ref 5–15)
BUN: 20 mg/dL (ref 8–23)
CO2: 29 mmol/L (ref 22–32)
Calcium: 9.4 mg/dL (ref 8.9–10.3)
Chloride: 108 mmol/L (ref 98–111)
Creatinine: 1.52 mg/dL — ABNORMAL HIGH (ref 0.61–1.24)
GFR, Estimated: 47 mL/min — ABNORMAL LOW (ref >60)
Glucose, Bld: 143 mg/dL — ABNORMAL HIGH (ref 70–99)
Potassium: 4.1 mmol/L (ref 3.5–5.1)
Sodium: 141 mmol/L (ref 135–145)
Total Bilirubin: 0.6 mg/dL (ref 0.0–1.2)
Total Protein: 5.8 g/dL — ABNORMAL LOW (ref 6.5–8.1)

## 2024-02-17 LAB — CBC WITH DIFFERENTIAL (CANCER CENTER ONLY)
Abs Immature Granulocytes: 0 K/uL (ref 0.00–0.07)
Basophils Absolute: 0 K/uL (ref 0.0–0.1)
Basophils Relative: 1 %
Eosinophils Absolute: 0.1 K/uL (ref 0.0–0.5)
Eosinophils Relative: 2 %
HCT: 33.6 % — ABNORMAL LOW (ref 39.0–52.0)
Hemoglobin: 11.4 g/dL — ABNORMAL LOW (ref 13.0–17.0)
Immature Granulocytes: 0 %
Lymphocytes Relative: 25 %
Lymphs Abs: 1 K/uL (ref 0.7–4.0)
MCH: 36.2 pg — ABNORMAL HIGH (ref 26.0–34.0)
MCHC: 33.9 g/dL (ref 30.0–36.0)
MCV: 106.7 fL — ABNORMAL HIGH (ref 80.0–100.0)
Monocytes Absolute: 0.4 K/uL (ref 0.1–1.0)
Monocytes Relative: 9 %
Neutro Abs: 2.6 K/uL (ref 1.7–7.7)
Neutrophils Relative %: 63 %
Platelet Count: 156 K/uL (ref 150–400)
RBC: 3.15 MIL/uL — ABNORMAL LOW (ref 4.22–5.81)
RDW: 13.3 % (ref 11.5–15.5)
WBC Count: 4.1 K/uL (ref 4.0–10.5)
nRBC: 0 % (ref 0.0–0.2)

## 2024-02-17 MED ORDER — ACETAMINOPHEN 500 MG PO TABS
1000.0000 mg | ORAL_TABLET | Freq: Once | ORAL | Status: AC
  Administered 2024-02-17: 1000 mg via ORAL

## 2024-02-17 MED ORDER — PROCHLORPERAZINE MALEATE 10 MG PO TABS
10.0000 mg | ORAL_TABLET | Freq: Once | ORAL | Status: AC
Start: 2024-02-17 — End: 2024-02-17
  Administered 2024-02-17: 10 mg via ORAL

## 2024-02-17 MED ORDER — DEXAMETHASONE SODIUM PHOSPHATE 10 MG/ML IJ SOLN
10.0000 mg | Freq: Once | INTRAMUSCULAR | Status: AC
  Administered 2024-02-17: 10 mg via INTRAVENOUS

## 2024-02-17 MED ORDER — SODIUM CHLORIDE 0.9% FLUSH
10.0000 mL | Freq: Once | INTRAVENOUS | Status: AC
Start: 2024-02-17 — End: 2024-02-17
  Administered 2024-02-17: 10 mL

## 2024-02-17 MED ORDER — SODIUM CHLORIDE 0.9 % IV SOLN: Freq: Once | INTRAVENOUS | Status: AC

## 2024-02-17 MED ORDER — DEXTROSE 5 % IV SOLN
56.0000 mg/m2 | Freq: Once | INTRAVENOUS | Status: AC
  Administered 2024-02-17: 120 mg via INTRAVENOUS
  Filled 2024-02-17: qty 60

## 2024-02-17 NOTE — Patient Instructions (Signed)

## 2024-02-19 ENCOUNTER — Other Ambulatory Visit: Payer: Self-pay

## 2024-02-23 ENCOUNTER — Encounter: Payer: Self-pay | Admitting: Hematology

## 2024-03-02 ENCOUNTER — Inpatient Hospital Stay

## 2024-03-02 ENCOUNTER — Inpatient Hospital Stay: Attending: Hematology

## 2024-03-02 VITALS — BP 122/78 | HR 62 | Temp 98.2°F | Resp 18 | Wt 189.5 lb

## 2024-03-02 DIAGNOSIS — C9000 Multiple myeloma not having achieved remission: Secondary | ICD-10-CM

## 2024-03-02 DIAGNOSIS — C9001 Multiple myeloma in remission: Secondary | ICD-10-CM | POA: Diagnosis not present

## 2024-03-02 DIAGNOSIS — Z7189 Other specified counseling: Secondary | ICD-10-CM

## 2024-03-02 DIAGNOSIS — Z95828 Presence of other vascular implants and grafts: Secondary | ICD-10-CM

## 2024-03-02 DIAGNOSIS — Z5112 Encounter for antineoplastic immunotherapy: Secondary | ICD-10-CM | POA: Insufficient documentation

## 2024-03-02 LAB — CBC WITH DIFFERENTIAL (CANCER CENTER ONLY)
Abs Immature Granulocytes: 0.01 K/uL (ref 0.00–0.07)
Basophils Absolute: 0 K/uL (ref 0.0–0.1)
Basophils Relative: 1 %
Eosinophils Absolute: 0.1 K/uL (ref 0.0–0.5)
Eosinophils Relative: 1 %
HCT: 33.5 % — ABNORMAL LOW (ref 39.0–52.0)
Hemoglobin: 11.4 g/dL — ABNORMAL LOW (ref 13.0–17.0)
Immature Granulocytes: 0 %
Lymphocytes Relative: 17 %
Lymphs Abs: 0.9 K/uL (ref 0.7–4.0)
MCH: 36.2 pg — ABNORMAL HIGH (ref 26.0–34.0)
MCHC: 34 g/dL (ref 30.0–36.0)
MCV: 106.3 fL — ABNORMAL HIGH (ref 80.0–100.0)
Monocytes Absolute: 0.4 K/uL (ref 0.1–1.0)
Monocytes Relative: 8 %
Neutro Abs: 3.9 K/uL (ref 1.7–7.7)
Neutrophils Relative %: 73 %
Platelet Count: 142 K/uL — ABNORMAL LOW (ref 150–400)
RBC: 3.15 MIL/uL — ABNORMAL LOW (ref 4.22–5.81)
RDW: 13.2 % (ref 11.5–15.5)
WBC Count: 5.3 K/uL (ref 4.0–10.5)
nRBC: 0 % (ref 0.0–0.2)

## 2024-03-02 LAB — CMP (CANCER CENTER ONLY)
ALT: 15 U/L (ref 0–44)
AST: 20 U/L (ref 15–41)
Albumin: 4 g/dL (ref 3.5–5.0)
Alkaline Phosphatase: 64 U/L (ref 38–126)
Anion gap: 5 (ref 5–15)
BUN: 23 mg/dL (ref 8–23)
CO2: 28 mmol/L (ref 22–32)
Calcium: 9.4 mg/dL (ref 8.9–10.3)
Chloride: 106 mmol/L (ref 98–111)
Creatinine: 1.55 mg/dL — ABNORMAL HIGH (ref 0.61–1.24)
GFR, Estimated: 46 mL/min — ABNORMAL LOW (ref >60)
Glucose, Bld: 125 mg/dL — ABNORMAL HIGH (ref 70–99)
Potassium: 4.3 mmol/L (ref 3.5–5.1)
Sodium: 139 mmol/L (ref 135–145)
Total Bilirubin: 0.7 mg/dL (ref 0.0–1.2)
Total Protein: 5.6 g/dL — ABNORMAL LOW (ref 6.5–8.1)

## 2024-03-02 MED ORDER — SODIUM CHLORIDE 0.9 % IV SOLN
INTRAVENOUS | Status: DC
Start: 2024-03-02 — End: 2024-03-02

## 2024-03-02 MED ORDER — SODIUM CHLORIDE 0.9 % IV SOLN
Freq: Once | INTRAVENOUS | Status: AC
Start: 2024-03-02 — End: 2024-03-02

## 2024-03-02 MED ORDER — SODIUM CHLORIDE 0.9% FLUSH
10.0000 mL | Freq: Once | INTRAVENOUS | Status: AC
  Administered 2024-03-02 (×2): 10 mL

## 2024-03-02 MED ORDER — PROCHLORPERAZINE MALEATE 10 MG PO TABS
10.0000 mg | ORAL_TABLET | Freq: Once | ORAL | Status: AC
  Administered 2024-03-02 (×2): 10 mg via ORAL
  Filled 2024-03-02: qty 1

## 2024-03-02 MED ORDER — DEXAMETHASONE SODIUM PHOSPHATE 10 MG/ML IJ SOLN
10.0000 mg | Freq: Once | INTRAMUSCULAR | Status: AC
  Administered 2024-03-02 (×2): 10 mg via INTRAVENOUS
  Filled 2024-03-02: qty 1

## 2024-03-02 MED ORDER — ACETAMINOPHEN 500 MG PO TABS
1000.0000 mg | ORAL_TABLET | Freq: Once | ORAL | Status: AC
  Administered 2024-03-02 (×2): 1000 mg via ORAL
  Filled 2024-03-02: qty 2

## 2024-03-02 MED ORDER — DEXTROSE 5 % IV SOLN
56.0000 mg/m2 | Freq: Once | INTRAVENOUS | Status: AC
  Administered 2024-03-02 (×2): 120 mg via INTRAVENOUS
  Filled 2024-03-02: qty 60

## 2024-03-02 NOTE — Progress Notes (Signed)
 Per Dr Onesimo pt ok for tx today with CR 1.55

## 2024-03-02 NOTE — Patient Instructions (Signed)

## 2024-03-10 ENCOUNTER — Encounter: Payer: Self-pay | Admitting: Hematology

## 2024-03-10 ENCOUNTER — Other Ambulatory Visit: Payer: Self-pay

## 2024-03-10 DIAGNOSIS — C9000 Multiple myeloma not having achieved remission: Secondary | ICD-10-CM

## 2024-03-10 DIAGNOSIS — C9001 Multiple myeloma in remission: Secondary | ICD-10-CM

## 2024-03-11 ENCOUNTER — Encounter: Payer: Self-pay | Admitting: Hematology

## 2024-03-11 MED ORDER — TRAMADOL HCL 50 MG PO TABS: 50.0000 mg | ORAL_TABLET | Freq: Four times a day (QID) | ORAL | 0 refills | Status: DC | PRN

## 2024-03-11 MED ORDER — FENTANYL 12 MCG/HR TD PT72: 1.0000 | MEDICATED_PATCH | TRANSDERMAL | 0 refills | Status: DC

## 2024-03-11 MED ORDER — ERGOCALCIFEROL 1.25 MG (50000 UT) PO CAPS: 50000.0000 [IU] | ORAL_CAPSULE | ORAL | 3 refills | Status: DC

## 2024-03-11 MED ORDER — TIZANIDINE HCL 2 MG PO CAPS: 2.0000 mg | ORAL_CAPSULE | Freq: Three times a day (TID) | ORAL | 0 refills | Status: DC | PRN

## 2024-03-16 ENCOUNTER — Inpatient Hospital Stay

## 2024-03-16 ENCOUNTER — Ambulatory Visit: Admitting: Hematology

## 2024-03-16 ENCOUNTER — Other Ambulatory Visit

## 2024-03-16 VITALS — BP 122/79 | HR 76 | Temp 97.9°F | Resp 18 | Ht 70.0 in | Wt 191.0 lb

## 2024-03-16 DIAGNOSIS — Z7189 Other specified counseling: Secondary | ICD-10-CM

## 2024-03-16 DIAGNOSIS — Z5112 Encounter for antineoplastic immunotherapy: Secondary | ICD-10-CM | POA: Diagnosis not present

## 2024-03-16 DIAGNOSIS — C9000 Multiple myeloma not having achieved remission: Secondary | ICD-10-CM

## 2024-03-16 LAB — CMP (CANCER CENTER ONLY)
ALT: 14 U/L (ref 0–44)
AST: 20 U/L (ref 15–41)
Albumin: 3.8 g/dL (ref 3.5–5.0)
Alkaline Phosphatase: 60 U/L (ref 38–126)
Anion gap: 4 — ABNORMAL LOW (ref 5–15)
BUN: 18 mg/dL (ref 8–23)
CO2: 29 mmol/L (ref 22–32)
Calcium: 9.4 mg/dL (ref 8.9–10.3)
Chloride: 106 mmol/L (ref 98–111)
Creatinine: 1.48 mg/dL — ABNORMAL HIGH (ref 0.61–1.24)
GFR, Estimated: 48 mL/min — ABNORMAL LOW (ref >60)
Glucose, Bld: 102 mg/dL — ABNORMAL HIGH (ref 70–99)
Potassium: 4.2 mmol/L (ref 3.5–5.1)
Sodium: 139 mmol/L (ref 135–145)
Total Bilirubin: 0.6 mg/dL (ref 0.0–1.2)
Total Protein: 5.6 g/dL — ABNORMAL LOW (ref 6.5–8.1)

## 2024-03-16 LAB — CBC WITH DIFFERENTIAL (CANCER CENTER ONLY)
Abs Immature Granulocytes: 0.02 K/uL (ref 0.00–0.07)
Basophils Absolute: 0.1 K/uL (ref 0.0–0.1)
Basophils Relative: 1 %
Eosinophils Absolute: 0.1 K/uL (ref 0.0–0.5)
Eosinophils Relative: 3 %
HCT: 34.3 % — ABNORMAL LOW (ref 39.0–52.0)
Hemoglobin: 11.6 g/dL — ABNORMAL LOW (ref 13.0–17.0)
Immature Granulocytes: 1 %
Lymphocytes Relative: 25 %
Lymphs Abs: 1.1 K/uL (ref 0.7–4.0)
MCH: 36.1 pg — ABNORMAL HIGH (ref 26.0–34.0)
MCHC: 33.8 g/dL (ref 30.0–36.0)
MCV: 106.9 fL — ABNORMAL HIGH (ref 80.0–100.0)
Monocytes Absolute: 0.4 K/uL (ref 0.1–1.0)
Monocytes Relative: 9 %
Neutro Abs: 2.7 K/uL (ref 1.7–7.7)
Neutrophils Relative %: 61 %
Platelet Count: 152 K/uL (ref 150–400)
RBC: 3.21 MIL/uL — ABNORMAL LOW (ref 4.22–5.81)
RDW: 13.1 % (ref 11.5–15.5)
WBC Count: 4.3 K/uL (ref 4.0–10.5)
nRBC: 0 % (ref 0.0–0.2)

## 2024-03-16 MED ORDER — DEXAMETHASONE SODIUM PHOSPHATE 10 MG/ML IJ SOLN
10.0000 mg | Freq: Once | INTRAMUSCULAR | Status: AC
  Administered 2024-03-16: 10 mg via INTRAVENOUS
  Filled 2024-03-16: qty 1

## 2024-03-16 MED ORDER — SODIUM CHLORIDE 0.9 % IV SOLN: Freq: Once | INTRAVENOUS | Status: AC

## 2024-03-16 MED ORDER — DEXTROSE 5 % IV SOLN
56.0000 mg/m2 | Freq: Once | INTRAVENOUS | Status: AC
  Administered 2024-03-16: 120 mg via INTRAVENOUS
  Filled 2024-03-16: qty 60

## 2024-03-16 MED ORDER — PROCHLORPERAZINE MALEATE 10 MG PO TABS
10.0000 mg | ORAL_TABLET | Freq: Once | ORAL | Status: AC
  Administered 2024-03-16: 10 mg via ORAL
  Filled 2024-03-16: qty 1

## 2024-03-16 MED ORDER — ACETAMINOPHEN 500 MG PO TABS
1000.0000 mg | ORAL_TABLET | Freq: Once | ORAL | Status: AC
  Administered 2024-03-16: 1000 mg via ORAL
  Filled 2024-03-16: qty 2

## 2024-03-16 NOTE — Patient Instructions (Signed)

## 2024-03-16 NOTE — Patient Instructions (Signed)

## 2024-03-23 LAB — MULTIPLE MYELOMA PANEL, SERUM
Albumin SerPl Elph-Mcnc: 3.6 g/dL (ref 2.9–4.4)
Albumin/Glob SerPl: 2.1 — ABNORMAL HIGH (ref 0.7–1.7)
Alpha 1: 0.2 g/dL (ref 0.0–0.4)
Alpha2 Glob SerPl Elph-Mcnc: 0.6 g/dL (ref 0.4–1.0)
B-Globulin SerPl Elph-Mcnc: 0.8 g/dL (ref 0.7–1.3)
Gamma Glob SerPl Elph-Mcnc: 0.2 g/dL — ABNORMAL LOW (ref 0.4–1.8)
Globulin, Total: 1.8 g/dL — ABNORMAL LOW (ref 2.2–3.9)
IgA: 23 mg/dL — ABNORMAL LOW (ref 61–437)
IgG (Immunoglobin G), Serum: 251 mg/dL — ABNORMAL LOW (ref 603–1613)
IgM (Immunoglobulin M), Srm: <5 mg/dL — ABNORMAL LOW (ref 15–143)
Total Protein ELP: 5.4 g/dL — ABNORMAL LOW (ref 6.0–8.5)

## 2024-03-30 ENCOUNTER — Inpatient Hospital Stay: Attending: Hematology

## 2024-03-30 ENCOUNTER — Inpatient Hospital Stay

## 2024-03-30 VITALS — BP 108/73 | HR 80 | Temp 97.7°F | Resp 16 | Wt 193.0 lb

## 2024-03-30 DIAGNOSIS — C9002 Multiple myeloma in relapse: Secondary | ICD-10-CM | POA: Insufficient documentation

## 2024-03-30 DIAGNOSIS — Z7189 Other specified counseling: Secondary | ICD-10-CM

## 2024-03-30 DIAGNOSIS — C9000 Multiple myeloma not having achieved remission: Secondary | ICD-10-CM

## 2024-03-30 DIAGNOSIS — Z5189 Encounter for other specified aftercare: Secondary | ICD-10-CM | POA: Insufficient documentation

## 2024-03-30 DIAGNOSIS — Z5112 Encounter for antineoplastic immunotherapy: Secondary | ICD-10-CM | POA: Diagnosis present

## 2024-03-30 LAB — CBC WITH DIFFERENTIAL (CANCER CENTER ONLY)
Abs Immature Granulocytes: 0.01 K/uL (ref 0.00–0.07)
Basophils Absolute: 0 K/uL (ref 0.0–0.1)
Basophils Relative: 1 %
Eosinophils Absolute: 0.1 K/uL (ref 0.0–0.5)
Eosinophils Relative: 2 %
HCT: 34.3 % — ABNORMAL LOW (ref 39.0–52.0)
Hemoglobin: 11.8 g/dL — ABNORMAL LOW (ref 13.0–17.0)
Immature Granulocytes: 0 %
Lymphocytes Relative: 24 %
Lymphs Abs: 1 K/uL (ref 0.7–4.0)
MCH: 36.1 pg — ABNORMAL HIGH (ref 26.0–34.0)
MCHC: 34.4 g/dL (ref 30.0–36.0)
MCV: 104.9 fL — ABNORMAL HIGH (ref 80.0–100.0)
Monocytes Absolute: 0.3 K/uL (ref 0.1–1.0)
Monocytes Relative: 8 %
Neutro Abs: 2.6 K/uL (ref 1.7–7.7)
Neutrophils Relative %: 65 %
Platelet Count: 163 K/uL (ref 150–400)
RBC: 3.27 MIL/uL — ABNORMAL LOW (ref 4.22–5.81)
RDW: 13.2 % (ref 11.5–15.5)
WBC Count: 4.1 K/uL (ref 4.0–10.5)
nRBC: 0 % (ref 0.0–0.2)

## 2024-03-30 LAB — CMP (CANCER CENTER ONLY)
ALT: 16 U/L (ref 0–44)
AST: 22 U/L (ref 15–41)
Albumin: 3.9 g/dL (ref 3.5–5.0)
Alkaline Phosphatase: 62 U/L (ref 38–126)
Anion gap: 5 (ref 5–15)
BUN: 20 mg/dL (ref 8–23)
CO2: 28 mmol/L (ref 22–32)
Calcium: 9.2 mg/dL (ref 8.9–10.3)
Chloride: 108 mmol/L (ref 98–111)
Creatinine: 1.56 mg/dL — ABNORMAL HIGH (ref 0.61–1.24)
GFR, Estimated: 45 mL/min — ABNORMAL LOW (ref >60)
Glucose, Bld: 117 mg/dL — ABNORMAL HIGH (ref 70–99)
Potassium: 4 mmol/L (ref 3.5–5.1)
Sodium: 141 mmol/L (ref 135–145)
Total Bilirubin: 0.7 mg/dL (ref 0.0–1.2)
Total Protein: 5.7 g/dL — ABNORMAL LOW (ref 6.5–8.1)

## 2024-03-30 MED ORDER — PROCHLORPERAZINE MALEATE 10 MG PO TABS
10.0000 mg | ORAL_TABLET | Freq: Once | ORAL | Status: AC
  Administered 2024-03-30: 10 mg via ORAL
  Filled 2024-03-30: qty 1

## 2024-03-30 MED ORDER — SODIUM CHLORIDE 0.9 % IV SOLN: Freq: Once | INTRAVENOUS | Status: AC

## 2024-03-30 MED ORDER — ACETAMINOPHEN 500 MG PO TABS
1000.0000 mg | ORAL_TABLET | Freq: Once | ORAL | Status: AC
  Administered 2024-03-30: 1000 mg via ORAL
  Filled 2024-03-30: qty 2

## 2024-03-30 MED ORDER — SODIUM CHLORIDE 0.9 % IV SOLN: INTRAVENOUS | Status: DC

## 2024-03-30 MED ORDER — DEXAMETHASONE SODIUM PHOSPHATE 10 MG/ML IJ SOLN
10.0000 mg | Freq: Once | INTRAMUSCULAR | Status: AC
  Administered 2024-03-30: 10 mg via INTRAVENOUS
  Filled 2024-03-30: qty 1

## 2024-03-30 MED ORDER — DEXTROSE 5 % IV SOLN
56.0000 mg/m2 | Freq: Once | INTRAVENOUS | Status: AC
  Administered 2024-03-30: 120 mg via INTRAVENOUS
  Filled 2024-03-30: qty 60

## 2024-03-30 NOTE — Progress Notes (Signed)
 Late entry: Pt ok to tx today with CR: 1.56

## 2024-03-30 NOTE — Patient Instructions (Signed)

## 2024-04-09 ENCOUNTER — Other Ambulatory Visit: Payer: Self-pay

## 2024-04-09 ENCOUNTER — Encounter: Payer: Self-pay | Admitting: Hematology

## 2024-04-09 DIAGNOSIS — C9000 Multiple myeloma not having achieved remission: Secondary | ICD-10-CM

## 2024-04-11 ENCOUNTER — Encounter: Payer: Self-pay | Admitting: Hematology

## 2024-04-11 MED ORDER — FENTANYL 12 MCG/HR TD PT72: 1.0000 | MEDICATED_PATCH | TRANSDERMAL | 0 refills | Status: DC

## 2024-04-12 ENCOUNTER — Other Ambulatory Visit: Payer: Self-pay | Admitting: Hematology

## 2024-04-12 ENCOUNTER — Encounter: Payer: Self-pay | Admitting: Hematology

## 2024-04-12 DIAGNOSIS — Z7189 Other specified counseling: Secondary | ICD-10-CM

## 2024-04-12 DIAGNOSIS — C9000 Multiple myeloma not having achieved remission: Secondary | ICD-10-CM

## 2024-04-13 ENCOUNTER — Other Ambulatory Visit: Payer: Self-pay

## 2024-04-13 ENCOUNTER — Inpatient Hospital Stay

## 2024-04-13 ENCOUNTER — Ambulatory Visit

## 2024-04-13 ENCOUNTER — Encounter: Payer: Self-pay | Admitting: Hematology

## 2024-04-13 VITALS — BP 111/74 | HR 91 | Temp 99.7°F | Resp 18 | Wt 192.4 lb

## 2024-04-13 DIAGNOSIS — C9000 Multiple myeloma not having achieved remission: Secondary | ICD-10-CM

## 2024-04-13 DIAGNOSIS — R42 Dizziness and giddiness: Secondary | ICD-10-CM

## 2024-04-13 DIAGNOSIS — Z5112 Encounter for antineoplastic immunotherapy: Secondary | ICD-10-CM | POA: Diagnosis not present

## 2024-04-13 DIAGNOSIS — Z7189 Other specified counseling: Secondary | ICD-10-CM

## 2024-04-13 LAB — CBC WITH DIFFERENTIAL (CANCER CENTER ONLY)
Abs Immature Granulocytes: 0.02 K/uL (ref 0.00–0.07)
Basophils Absolute: 0 K/uL (ref 0.0–0.1)
Basophils Relative: 0 %
Eosinophils Absolute: 0 K/uL (ref 0.0–0.5)
Eosinophils Relative: 0 %
HCT: 34.2 % — ABNORMAL LOW (ref 39.0–52.0)
Hemoglobin: 11.7 g/dL — ABNORMAL LOW (ref 13.0–17.0)
Immature Granulocytes: 0 %
Lymphocytes Relative: 10 %
Lymphs Abs: 0.7 K/uL (ref 0.7–4.0)
MCH: 35.8 pg — ABNORMAL HIGH (ref 26.0–34.0)
MCHC: 34.2 g/dL (ref 30.0–36.0)
MCV: 104.6 fL — ABNORMAL HIGH (ref 80.0–100.0)
Monocytes Absolute: 0.4 K/uL (ref 0.1–1.0)
Monocytes Relative: 6 %
Neutro Abs: 5.7 K/uL (ref 1.7–7.7)
Neutrophils Relative %: 84 %
Platelet Count: 155 K/uL (ref 150–400)
RBC: 3.27 MIL/uL — ABNORMAL LOW (ref 4.22–5.81)
RDW: 13.3 % (ref 11.5–15.5)
WBC Count: 6.9 K/uL (ref 4.0–10.5)
nRBC: 0 % (ref 0.0–0.2)

## 2024-04-13 LAB — CMP (CANCER CENTER ONLY)
ALT: 17 U/L (ref 0–44)
AST: 27 U/L (ref 15–41)
Albumin: 4 g/dL (ref 3.5–5.0)
Alkaline Phosphatase: 57 U/L (ref 38–126)
Anion gap: 6 (ref 5–15)
BUN: 20 mg/dL (ref 8–23)
CO2: 27 mmol/L (ref 22–32)
Calcium: 9.4 mg/dL (ref 8.9–10.3)
Chloride: 106 mmol/L (ref 98–111)
Creatinine: 1.73 mg/dL — ABNORMAL HIGH (ref 0.61–1.24)
GFR, Estimated: 40 mL/min — ABNORMAL LOW (ref >60)
Glucose, Bld: 156 mg/dL — ABNORMAL HIGH (ref 70–99)
Potassium: 4.1 mmol/L (ref 3.5–5.1)
Sodium: 139 mmol/L (ref 135–145)
Total Bilirubin: 1.1 mg/dL (ref 0.0–1.2)
Total Protein: 5.9 g/dL — ABNORMAL LOW (ref 6.5–8.1)

## 2024-04-13 MED ORDER — SODIUM CHLORIDE 0.9 % IV SOLN: Freq: Once | INTRAVENOUS | Status: AC

## 2024-04-13 NOTE — Progress Notes (Signed)
 Pt here for kyprolis , last CT imaging for port placement was 11/19/19. Dr. Onesimo evaluated xray 09/25/22 CXR 09/2022-- looked at this-- position at cavo atrial junction -- okay to use.   Pt then reported a recent fall within the last 4 hours. Reported he was walking his dog and fell. Right arm injured. Pt appears to have thin skin that rubbed off when he fell. Reports no head injury. VSS. Dr. Onesimo decided to hold treatment and give 1 liter NS over an hour. Pt verbalized understanding. Wound dressed with petroleum jelly gauze and wrapped with coban

## 2024-04-13 NOTE — Patient Instructions (Signed)

## 2024-04-15 ENCOUNTER — Other Ambulatory Visit: Payer: Self-pay

## 2024-04-20 ENCOUNTER — Other Ambulatory Visit: Payer: Self-pay

## 2024-04-25 ENCOUNTER — Encounter: Payer: Self-pay | Admitting: Hematology

## 2024-04-26 ENCOUNTER — Encounter: Payer: Self-pay | Admitting: Hematology

## 2024-04-26 ENCOUNTER — Other Ambulatory Visit: Payer: Self-pay

## 2024-04-26 DIAGNOSIS — C9000 Multiple myeloma not having achieved remission: Secondary | ICD-10-CM

## 2024-04-26 DIAGNOSIS — C9001 Multiple myeloma in remission: Secondary | ICD-10-CM

## 2024-04-26 MED ORDER — TIZANIDINE HCL 2 MG PO CAPS: 2.0000 mg | ORAL_CAPSULE | Freq: Three times a day (TID) | ORAL | 0 refills | Status: DC | PRN

## 2024-04-26 MED ORDER — TRAMADOL HCL 50 MG PO TABS: 50.0000 mg | ORAL_TABLET | Freq: Four times a day (QID) | ORAL | 0 refills | Status: DC | PRN

## 2024-04-27 ENCOUNTER — Encounter: Payer: Self-pay | Admitting: Hematology

## 2024-04-27 ENCOUNTER — Inpatient Hospital Stay

## 2024-04-27 ENCOUNTER — Inpatient Hospital Stay: Attending: Hematology

## 2024-04-27 ENCOUNTER — Inpatient Hospital Stay (HOSPITAL_BASED_OUTPATIENT_CLINIC_OR_DEPARTMENT_OTHER): Admitting: Hematology

## 2024-04-27 VITALS — BP 121/83 | HR 77 | Temp 98.8°F | Resp 17 | Wt 192.0 lb

## 2024-04-27 DIAGNOSIS — Z5112 Encounter for antineoplastic immunotherapy: Secondary | ICD-10-CM | POA: Diagnosis present

## 2024-04-27 DIAGNOSIS — Z5111 Encounter for antineoplastic chemotherapy: Secondary | ICD-10-CM | POA: Diagnosis not present

## 2024-04-27 DIAGNOSIS — C9000 Multiple myeloma not having achieved remission: Secondary | ICD-10-CM

## 2024-04-27 DIAGNOSIS — C9001 Multiple myeloma in remission: Secondary | ICD-10-CM | POA: Insufficient documentation

## 2024-04-27 DIAGNOSIS — Z7189 Other specified counseling: Secondary | ICD-10-CM

## 2024-04-27 LAB — CMP (CANCER CENTER ONLY)
ALT: 14 U/L (ref 0–44)
AST: 21 U/L (ref 15–41)
Albumin: 4 g/dL (ref 3.5–5.0)
Alkaline Phosphatase: 61 U/L (ref 38–126)
Anion gap: 5 (ref 5–15)
BUN: 19 mg/dL (ref 8–23)
CO2: 28 mmol/L (ref 22–32)
Calcium: 10 mg/dL (ref 8.9–10.3)
Chloride: 106 mmol/L (ref 98–111)
Creatinine: 1.48 mg/dL — ABNORMAL HIGH (ref 0.61–1.24)
GFR, Estimated: 48 mL/min — ABNORMAL LOW (ref >60)
Glucose, Bld: 135 mg/dL — ABNORMAL HIGH (ref 70–99)
Potassium: 4.2 mmol/L (ref 3.5–5.1)
Sodium: 139 mmol/L (ref 135–145)
Total Bilirubin: 0.9 mg/dL (ref 0.0–1.2)
Total Protein: 6 g/dL — ABNORMAL LOW (ref 6.5–8.1)

## 2024-04-27 LAB — CBC WITH DIFFERENTIAL (CANCER CENTER ONLY)
Abs Immature Granulocytes: 0.01 K/uL (ref 0.00–0.07)
Basophils Absolute: 0 K/uL (ref 0.0–0.1)
Basophils Relative: 1 %
Eosinophils Absolute: 0.1 K/uL (ref 0.0–0.5)
Eosinophils Relative: 2 %
HCT: 34.8 % — ABNORMAL LOW (ref 39.0–52.0)
Hemoglobin: 11.8 g/dL — ABNORMAL LOW (ref 13.0–17.0)
Immature Granulocytes: 0 %
Lymphocytes Relative: 25 %
Lymphs Abs: 1.1 K/uL (ref 0.7–4.0)
MCH: 35.6 pg — ABNORMAL HIGH (ref 26.0–34.0)
MCHC: 33.9 g/dL (ref 30.0–36.0)
MCV: 105.1 fL — ABNORMAL HIGH (ref 80.0–100.0)
Monocytes Absolute: 0.4 K/uL (ref 0.1–1.0)
Monocytes Relative: 8 %
Neutro Abs: 3 K/uL (ref 1.7–7.7)
Neutrophils Relative %: 64 %
Platelet Count: 127 K/uL — ABNORMAL LOW (ref 150–400)
RBC: 3.31 MIL/uL — ABNORMAL LOW (ref 4.22–5.81)
RDW: 13.3 % (ref 11.5–15.5)
WBC Count: 4.7 K/uL (ref 4.0–10.5)
nRBC: 0 % (ref 0.0–0.2)

## 2024-04-27 MED ORDER — PROCHLORPERAZINE MALEATE 10 MG PO TABS
10.0000 mg | ORAL_TABLET | Freq: Once | ORAL | Status: AC
  Administered 2024-04-27: 10 mg via ORAL
  Filled 2024-04-27: qty 1

## 2024-04-27 MED ORDER — ACETAMINOPHEN 500 MG PO TABS
1000.0000 mg | ORAL_TABLET | Freq: Once | ORAL | Status: AC
  Administered 2024-04-27: 1000 mg via ORAL
  Filled 2024-04-27: qty 2

## 2024-04-27 MED ORDER — DEXTROSE 5 % IV SOLN
56.0000 mg/m2 | Freq: Once | INTRAVENOUS | Status: AC
  Administered 2024-04-27: 120 mg via INTRAVENOUS
  Filled 2024-04-27: qty 60

## 2024-04-27 MED ORDER — DEXAMETHASONE SODIUM PHOSPHATE 10 MG/ML IJ SOLN
10.0000 mg | Freq: Once | INTRAMUSCULAR | Status: AC
  Administered 2024-04-27: 10 mg via INTRAVENOUS
  Filled 2024-04-27: qty 1

## 2024-04-27 MED ORDER — SODIUM CHLORIDE 0.9 % IV SOLN: INTRAVENOUS | Status: DC

## 2024-04-27 NOTE — Patient Instructions (Signed)
 [facility] - A DEPT OF [facility]  Discharge Instructions: Thank you for choosing [facility] to provide your oncology and hematology care.   If you have a lab appointment with the Cancer Center, please go directly to the Cancer Center and check in at the registration area.   Wear comfortable clothing and clothing appropriate for easy access to any Portacath or PICC line.   We strive to give you quality time with your provider. You may need to reschedule your appointment if you arrive late (15 or more minutes).  Arriving late affects you and other patients whose appointments are after yours.  Also, if you miss three or more appointments without notifying the office, you may be dismissed from the clinic at the provider's discretion.      For prescription refill requests, have your pharmacy contact our office and allow 72 hours for refills to be completed.    Today you received the following chemotherapy and/or immunotherapy agents kyprolis      To help prevent nausea and vomiting after your treatment, we encourage you to take your nausea medication as directed.  BELOW ARE SYMPTOMS THAT SHOULD BE REPORTED IMMEDIATELY: *FEVER GREATER THAN 100.4 F (38 C) OR HIGHER *CHILLS OR SWEATING *NAUSEA AND VOMITING THAT IS NOT CONTROLLED WITH YOUR NAUSEA MEDICATION *UNUSUAL SHORTNESS OF BREATH *UNUSUAL BRUISING OR BLEEDING *URINARY PROBLEMS (pain or burning when urinating, or frequent urination) *BOWEL PROBLEMS (unusual diarrhea, constipation, pain near the anus) TENDERNESS IN MOUTH AND THROAT WITH OR WITHOUT PRESENCE OF ULCERS (sore throat, sores in mouth, or a toothache) UNUSUAL RASH, SWELLING OR PAIN  UNUSUAL VAGINAL DISCHARGE OR ITCHING   Items with * indicate a potential emergency and should be followed up as soon as possible or go to the Emergency Department if any problems should occur.  Please show the CHEMOTHERAPY ALERT CARD or IMMUNOTHERAPY  ALERT CARD at check-in to the Emergency Department and triage nurse.  Should you have questions after your visit or need to cancel or reschedule your appointment, please contact [facility] - A DEPT OF [facility]  Dept: [phone]  and follow the prompts.  Office hours are 8:00 a.m. to 4:30 p.m. Monday - Friday. Please note that voicemails left after 4:00 p.m. may not be returned until the following business day.  We are closed weekends and major holidays. You have access to a nurse at all times for urgent questions. Please call the main number to the clinic Dept: [phone] and follow the prompts.   For any non-urgent questions, you may also contact your provider using MyChart. We now offer e-Visits for anyone 80 and older to request care online for non-urgent symptoms. For details visit [email or web address].   Also download the MyChart app! Go to the app store, search "MyChart", open the app, select [facility], and log in with your MyChart username and password.

## 2024-04-27 NOTE — Progress Notes (Signed)
 Okay with proceeding with Kyprolis  treatment today as Cycle 8 Day 1 per Dr. Olena. Scheduler to be made aware to relink future appointments.  Harlene Nasuti, PharmD Oncology Infusion Pharmacist 04/27/2024 2:30 PM
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
## 2024-05-03 ENCOUNTER — Other Ambulatory Visit: Payer: Self-pay

## 2024-05-03 ENCOUNTER — Encounter: Payer: Self-pay | Admitting: Hematology

## 2024-05-03 DIAGNOSIS — C9001 Multiple myeloma in remission: Secondary | ICD-10-CM

## 2024-05-03 MED ORDER — TRAZODONE HCL 50 MG PO TABS: 50.0000 mg | ORAL_TABLET | Freq: Every evening | ORAL | 1 refills | Status: DC | PRN

## 2024-05-04 ENCOUNTER — Encounter: Payer: Self-pay | Admitting: Hematology

## 2024-05-07 ENCOUNTER — Encounter: Payer: Self-pay | Admitting: Hematology

## 2024-05-08 ENCOUNTER — Encounter: Payer: Self-pay | Admitting: Hematology

## 2024-05-10 ENCOUNTER — Encounter: Payer: Self-pay | Admitting: Hematology

## 2024-05-10 ENCOUNTER — Other Ambulatory Visit: Payer: Self-pay

## 2024-05-10 DIAGNOSIS — C9000 Multiple myeloma not having achieved remission: Secondary | ICD-10-CM

## 2024-05-11 ENCOUNTER — Inpatient Hospital Stay

## 2024-05-11 ENCOUNTER — Encounter: Payer: Self-pay | Admitting: Hematology

## 2024-05-11 VITALS — BP 121/73 | HR 88 | Temp 98.7°F | Resp 18 | Wt 190.0 lb

## 2024-05-11 DIAGNOSIS — Z7189 Other specified counseling: Secondary | ICD-10-CM

## 2024-05-11 DIAGNOSIS — Z5112 Encounter for antineoplastic immunotherapy: Secondary | ICD-10-CM | POA: Diagnosis not present

## 2024-05-11 DIAGNOSIS — C9000 Multiple myeloma not having achieved remission: Secondary | ICD-10-CM

## 2024-05-11 DIAGNOSIS — Z95828 Presence of other vascular implants and grafts: Secondary | ICD-10-CM

## 2024-05-11 LAB — CBC WITH DIFFERENTIAL (CANCER CENTER ONLY)
Abs Immature Granulocytes: 0.01 K/uL (ref 0.00–0.07)
Basophils Absolute: 0.1 K/uL (ref 0.0–0.1)
Basophils Relative: 1 %
Eosinophils Absolute: 0.1 K/uL (ref 0.0–0.5)
Eosinophils Relative: 2 %
HCT: 34.9 % — ABNORMAL LOW (ref 39.0–52.0)
Hemoglobin: 11.9 g/dL — ABNORMAL LOW (ref 13.0–17.0)
Immature Granulocytes: 0 %
Lymphocytes Relative: 21 %
Lymphs Abs: 0.9 K/uL (ref 0.7–4.0)
MCH: 35.8 pg — ABNORMAL HIGH (ref 26.0–34.0)
MCHC: 34.1 g/dL (ref 30.0–36.0)
MCV: 105.1 fL — ABNORMAL HIGH (ref 80.0–100.0)
Monocytes Absolute: 0.4 K/uL (ref 0.1–1.0)
Monocytes Relative: 9 %
Neutro Abs: 3 K/uL (ref 1.7–7.7)
Neutrophils Relative %: 67 %
Platelet Count: 168 K/uL (ref 150–400)
RBC: 3.32 MIL/uL — ABNORMAL LOW (ref 4.22–5.81)
RDW: 13.4 % (ref 11.5–15.5)
WBC Count: 4.4 K/uL (ref 4.0–10.5)
nRBC: 0 % (ref 0.0–0.2)

## 2024-05-11 LAB — CMP (CANCER CENTER ONLY)
ALT: 15 U/L (ref 0–44)
AST: 20 U/L (ref 15–41)
Albumin: 3.9 g/dL (ref 3.5–5.0)
Alkaline Phosphatase: 61 U/L (ref 38–126)
Anion gap: 6 (ref 5–15)
BUN: 17 mg/dL (ref 8–23)
CO2: 28 mmol/L (ref 22–32)
Calcium: 10 mg/dL (ref 8.9–10.3)
Chloride: 107 mmol/L (ref 98–111)
Creatinine: 1.52 mg/dL — ABNORMAL HIGH (ref 0.61–1.24)
GFR, Estimated: 47 mL/min — ABNORMAL LOW (ref >60)
Glucose, Bld: 122 mg/dL — ABNORMAL HIGH (ref 70–99)
Potassium: 4.4 mmol/L (ref 3.5–5.1)
Sodium: 141 mmol/L (ref 135–145)
Total Bilirubin: 0.7 mg/dL (ref 0.0–1.2)
Total Protein: 5.7 g/dL — ABNORMAL LOW (ref 6.5–8.1)

## 2024-05-11 MED ORDER — DEXAMETHASONE SOD PHOSPHATE PF 10 MG/ML IJ SOLN
10.0000 mg | Freq: Once | INTRAMUSCULAR | Status: AC
  Administered 2024-05-11: 10 mg via INTRAVENOUS

## 2024-05-11 MED ORDER — DEXTROSE 5 % IV SOLN
56.0000 mg/m2 | Freq: Once | INTRAVENOUS | Status: AC
  Administered 2024-05-11: 120 mg via INTRAVENOUS
  Filled 2024-05-11: qty 60

## 2024-05-11 MED ORDER — SODIUM CHLORIDE 0.9 % IV SOLN: Freq: Once | INTRAVENOUS | Status: AC

## 2024-05-11 MED ORDER — FENTANYL 12 MCG/HR TD PT72: 1.0000 | MEDICATED_PATCH | TRANSDERMAL | 0 refills | Status: DC

## 2024-05-11 MED ORDER — ACETAMINOPHEN 500 MG PO TABS
1000.0000 mg | ORAL_TABLET | Freq: Once | ORAL | Status: AC
  Administered 2024-05-11: 1000 mg via ORAL
  Filled 2024-05-11: qty 2

## 2024-05-11 MED ORDER — SODIUM CHLORIDE 0.9% FLUSH: 10.0000 mL | INTRAVENOUS | Status: DC | PRN

## 2024-05-11 MED ORDER — PROCHLORPERAZINE MALEATE 10 MG PO TABS
10.0000 mg | ORAL_TABLET | Freq: Once | ORAL | Status: AC
  Administered 2024-05-11: 10 mg via ORAL
  Filled 2024-05-11: qty 1

## 2024-05-11 NOTE — Patient Instructions (Signed)
 [facility] - A DEPT OF [facility]  Discharge Instructions: Thank you for choosing [facility] to provide your oncology and hematology care.   If you have a lab appointment with the Cancer Center, please go directly to the Cancer Center and check in at the registration area.   Wear comfortable clothing and clothing appropriate for easy access to any Portacath or PICC line.   We strive to give you quality time with your provider. You may need to reschedule your appointment if you arrive late (15 or more minutes).  Arriving late affects you and other patients whose appointments are after yours.  Also, if you miss three or more appointments without notifying the office, you may be dismissed from the clinic at the provider's discretion.      For prescription refill requests, have your pharmacy contact our office and allow 72 hours for refills to be completed.    Today you received the following chemotherapy and/or immunotherapy agents kyprolis      To help prevent nausea and vomiting after your treatment, we encourage you to take your nausea medication as directed.  BELOW ARE SYMPTOMS THAT SHOULD BE REPORTED IMMEDIATELY: *FEVER GREATER THAN 100.4 F (38 C) OR HIGHER *CHILLS OR SWEATING *NAUSEA AND VOMITING THAT IS NOT CONTROLLED WITH YOUR NAUSEA MEDICATION *UNUSUAL SHORTNESS OF BREATH *UNUSUAL BRUISING OR BLEEDING *URINARY PROBLEMS (pain or burning when urinating, or frequent urination) *BOWEL PROBLEMS (unusual diarrhea, constipation, pain near the anus) TENDERNESS IN MOUTH AND THROAT WITH OR WITHOUT PRESENCE OF ULCERS (sore throat, sores in mouth, or a toothache) UNUSUAL RASH, SWELLING OR PAIN  UNUSUAL VAGINAL DISCHARGE OR ITCHING   Items with * indicate a potential emergency and should be followed up as soon as possible or go to the Emergency Department if any problems should occur.  Please show the CHEMOTHERAPY ALERT CARD or IMMUNOTHERAPY  ALERT CARD at check-in to the Emergency Department and triage nurse.  Should you have questions after your visit or need to cancel or reschedule your appointment, please contact [facility] - A DEPT OF [facility]  Dept: [phone]  and follow the prompts.  Office hours are 8:00 a.m. to 4:30 p.m. Monday - Friday. Please note that voicemails left after 4:00 p.m. may not be returned until the following business day.  We are closed weekends and major holidays. You have access to a nurse at all times for urgent questions. Please call the main number to the clinic Dept: [phone] and follow the prompts.   For any non-urgent questions, you may also contact your provider using MyChart. We now offer e-Visits for anyone 80 and older to request care online for non-urgent symptoms. For details visit [email or web address].   Also download the MyChart app! Go to the app store, search "MyChart", open the app, select [facility], and log in with your MyChart username and password.

## 2024-05-11 NOTE — Progress Notes (Signed)
 Pt ok for tx today with CR of 1.52 per Dr Onesimo

## 2024-05-13 ENCOUNTER — Other Ambulatory Visit: Payer: Self-pay | Admitting: Internal Medicine

## 2024-05-19 ENCOUNTER — Other Ambulatory Visit: Payer: Self-pay

## 2024-05-24 ENCOUNTER — Encounter: Payer: Self-pay | Admitting: Hematology

## 2024-05-24 ENCOUNTER — Other Ambulatory Visit: Payer: Self-pay

## 2024-05-24 DIAGNOSIS — C9000 Multiple myeloma not having achieved remission: Secondary | ICD-10-CM

## 2024-05-24 MED ORDER — ONDANSETRON HCL 8 MG PO TABS: 8.0000 mg | ORAL_TABLET | Freq: Three times a day (TID) | ORAL | 0 refills | Status: AC | PRN

## 2024-05-25 ENCOUNTER — Inpatient Hospital Stay: Attending: Hematology

## 2024-05-25 ENCOUNTER — Inpatient Hospital Stay

## 2024-05-25 VITALS — BP 128/78 | HR 80 | Temp 98.6°F | Resp 18 | Wt 190.5 lb

## 2024-05-25 DIAGNOSIS — Z5112 Encounter for antineoplastic immunotherapy: Secondary | ICD-10-CM | POA: Diagnosis present

## 2024-05-25 DIAGNOSIS — Z7189 Other specified counseling: Secondary | ICD-10-CM

## 2024-05-25 DIAGNOSIS — C9001 Multiple myeloma in remission: Secondary | ICD-10-CM | POA: Diagnosis not present

## 2024-05-25 DIAGNOSIS — C9000 Multiple myeloma not having achieved remission: Secondary | ICD-10-CM

## 2024-05-25 LAB — CMP (CANCER CENTER ONLY)
ALT: 13 U/L (ref 0–44)
AST: 21 U/L (ref 15–41)
Albumin: 3.8 g/dL (ref 3.5–5.0)
Alkaline Phosphatase: 61 U/L (ref 38–126)
Anion gap: 7 (ref 5–15)
BUN: 22 mg/dL (ref 8–23)
CO2: 26 mmol/L (ref 22–32)
Calcium: 9.2 mg/dL (ref 8.9–10.3)
Chloride: 108 mmol/L (ref 98–111)
Creatinine: 1.55 mg/dL — ABNORMAL HIGH (ref 0.61–1.24)
GFR, Estimated: 46 mL/min — ABNORMAL LOW (ref >60)
Glucose, Bld: 119 mg/dL — ABNORMAL HIGH (ref 70–99)
Potassium: 4.3 mmol/L (ref 3.5–5.1)
Sodium: 141 mmol/L (ref 135–145)
Total Bilirubin: 0.6 mg/dL (ref 0.0–1.2)
Total Protein: 5.6 g/dL — ABNORMAL LOW (ref 6.5–8.1)

## 2024-05-25 LAB — CBC WITH DIFFERENTIAL (CANCER CENTER ONLY)
Abs Immature Granulocytes: 0.01 K/uL (ref 0.00–0.07)
Basophils Absolute: 0.1 K/uL (ref 0.0–0.1)
Basophils Relative: 1 %
Eosinophils Absolute: 0.1 K/uL (ref 0.0–0.5)
Eosinophils Relative: 2 %
HCT: 33.4 % — ABNORMAL LOW (ref 39.0–52.0)
Hemoglobin: 11.4 g/dL — ABNORMAL LOW (ref 13.0–17.0)
Immature Granulocytes: 0 %
Lymphocytes Relative: 24 %
Lymphs Abs: 1 K/uL (ref 0.7–4.0)
MCH: 35.6 pg — ABNORMAL HIGH (ref 26.0–34.0)
MCHC: 34.1 g/dL (ref 30.0–36.0)
MCV: 104.4 fL — ABNORMAL HIGH (ref 80.0–100.0)
Monocytes Absolute: 0.3 K/uL (ref 0.1–1.0)
Monocytes Relative: 8 %
Neutro Abs: 2.7 K/uL (ref 1.7–7.7)
Neutrophils Relative %: 65 %
Platelet Count: 140 K/uL — ABNORMAL LOW (ref 150–400)
RBC: 3.2 MIL/uL — ABNORMAL LOW (ref 4.22–5.81)
RDW: 13.6 % (ref 11.5–15.5)
WBC Count: 4.1 K/uL (ref 4.0–10.5)
nRBC: 0 % (ref 0.0–0.2)

## 2024-05-25 MED ORDER — DEXTROSE 5 % IV SOLN
56.0000 mg/m2 | Freq: Once | INTRAVENOUS | Status: AC
  Administered 2024-05-25: 120 mg via INTRAVENOUS
  Filled 2024-05-25: qty 60

## 2024-05-25 MED ORDER — PROCHLORPERAZINE MALEATE 10 MG PO TABS
10.0000 mg | ORAL_TABLET | Freq: Once | ORAL | Status: AC
  Administered 2024-05-25: 10 mg via ORAL
  Filled 2024-05-25: qty 1

## 2024-05-25 MED ORDER — SODIUM CHLORIDE 0.9 % IV SOLN: Freq: Once | INTRAVENOUS | Status: AC

## 2024-05-25 MED ORDER — ACETAMINOPHEN 500 MG PO TABS
1000.0000 mg | ORAL_TABLET | Freq: Once | ORAL | Status: AC
  Administered 2024-05-25: 1000 mg via ORAL
  Filled 2024-05-25: qty 2

## 2024-05-25 MED ORDER — DEXAMETHASONE SOD PHOSPHATE PF 10 MG/ML IJ SOLN
10.0000 mg | Freq: Once | INTRAMUSCULAR | Status: AC
  Administered 2024-05-25: 10 mg via INTRAVENOUS

## 2024-05-25 MED ORDER — SODIUM CHLORIDE 0.9 % IV SOLN: INTRAVENOUS | Status: DC

## 2024-05-25 NOTE — Patient Instructions (Signed)

## 2024-05-26 ENCOUNTER — Other Ambulatory Visit: Payer: Self-pay

## 2024-05-31 LAB — MULTIPLE MYELOMA PANEL, SERUM
Albumin SerPl Elph-Mcnc: 3.5 g/dL (ref 2.9–4.4)
Albumin/Glob SerPl: 1.9 — ABNORMAL HIGH (ref 0.7–1.7)
Alpha 1: 0.3 g/dL (ref 0.0–0.4)
Alpha2 Glob SerPl Elph-Mcnc: 0.6 g/dL (ref 0.4–1.0)
B-Globulin SerPl Elph-Mcnc: 0.8 g/dL (ref 0.7–1.3)
Gamma Glob SerPl Elph-Mcnc: 0.2 g/dL — ABNORMAL LOW (ref 0.4–1.8)
Globulin, Total: 1.9 g/dL — ABNORMAL LOW (ref 2.2–3.9)
IgA: 21 mg/dL — ABNORMAL LOW (ref 61–437)
IgG (Immunoglobin G), Serum: 258 mg/dL — ABNORMAL LOW (ref 603–1613)
IgM (Immunoglobulin M), Srm: 6 mg/dL — ABNORMAL LOW (ref 15–143)
Total Protein ELP: 5.4 g/dL — ABNORMAL LOW (ref 6.0–8.5)

## 2024-06-03 ENCOUNTER — Other Ambulatory Visit: Payer: Self-pay

## 2024-06-03 DIAGNOSIS — C9000 Multiple myeloma not having achieved remission: Secondary | ICD-10-CM

## 2024-06-03 DIAGNOSIS — C9001 Multiple myeloma in remission: Secondary | ICD-10-CM

## 2024-06-04 ENCOUNTER — Encounter: Payer: Self-pay | Admitting: Hematology

## 2024-06-04 MED ORDER — TIZANIDINE HCL 2 MG PO CAPS: 2.0000 mg | ORAL_CAPSULE | Freq: Three times a day (TID) | ORAL | 0 refills | Status: DC | PRN

## 2024-06-04 MED ORDER — TRAMADOL HCL 50 MG PO TABS: 50.0000 mg | ORAL_TABLET | Freq: Four times a day (QID) | ORAL | 0 refills | Status: DC | PRN

## 2024-06-07 ENCOUNTER — Encounter: Payer: Self-pay | Admitting: Hematology

## 2024-06-08 ENCOUNTER — Encounter: Payer: Self-pay | Admitting: Hematology

## 2024-06-08 ENCOUNTER — Inpatient Hospital Stay

## 2024-06-08 ENCOUNTER — Other Ambulatory Visit

## 2024-06-08 ENCOUNTER — Ambulatory Visit: Admitting: Hematology

## 2024-06-08 ENCOUNTER — Other Ambulatory Visit: Payer: Self-pay

## 2024-06-08 VITALS — BP 132/84 | HR 71 | Temp 98.4°F | Resp 16 | Ht 70.0 in | Wt 186.8 lb

## 2024-06-08 DIAGNOSIS — Z7189 Other specified counseling: Secondary | ICD-10-CM

## 2024-06-08 DIAGNOSIS — Z5112 Encounter for antineoplastic immunotherapy: Secondary | ICD-10-CM | POA: Diagnosis not present

## 2024-06-08 DIAGNOSIS — C9000 Multiple myeloma not having achieved remission: Secondary | ICD-10-CM

## 2024-06-08 LAB — CBC WITH DIFFERENTIAL (CANCER CENTER ONLY)
Abs Immature Granulocytes: 0.02 K/uL (ref 0.00–0.07)
Basophils Absolute: 0 K/uL (ref 0.0–0.1)
Basophils Relative: 1 %
Eosinophils Absolute: 0.1 K/uL (ref 0.0–0.5)
Eosinophils Relative: 1 %
HCT: 35.4 % — ABNORMAL LOW (ref 39.0–52.0)
Hemoglobin: 12.1 g/dL — ABNORMAL LOW (ref 13.0–17.0)
Immature Granulocytes: 0 %
Lymphocytes Relative: 17 %
Lymphs Abs: 1 K/uL (ref 0.7–4.0)
MCH: 35.8 pg — ABNORMAL HIGH (ref 26.0–34.0)
MCHC: 34.2 g/dL (ref 30.0–36.0)
MCV: 104.7 fL — ABNORMAL HIGH (ref 80.0–100.0)
Monocytes Absolute: 0.4 K/uL (ref 0.1–1.0)
Monocytes Relative: 7 %
Neutro Abs: 4 K/uL (ref 1.7–7.7)
Neutrophils Relative %: 74 %
Platelet Count: 138 K/uL — ABNORMAL LOW (ref 150–400)
RBC: 3.38 MIL/uL — ABNORMAL LOW (ref 4.22–5.81)
RDW: 14.4 % (ref 11.5–15.5)
WBC Count: 5.5 K/uL (ref 4.0–10.5)
nRBC: 0 % (ref 0.0–0.2)

## 2024-06-08 LAB — CMP (CANCER CENTER ONLY)
ALT: 22 U/L (ref 0–44)
AST: 30 U/L (ref 15–41)
Albumin: 4.2 g/dL (ref 3.5–5.0)
Alkaline Phosphatase: 73 U/L (ref 38–126)
Anion gap: 12 (ref 5–15)
BUN: 24 mg/dL — ABNORMAL HIGH (ref 8–23)
CO2: 24 mmol/L (ref 22–32)
Calcium: 9.8 mg/dL (ref 8.9–10.3)
Chloride: 106 mmol/L (ref 98–111)
Creatinine: 1.44 mg/dL — ABNORMAL HIGH (ref 0.61–1.24)
GFR, Estimated: 50 mL/min — ABNORMAL LOW (ref >60)
Glucose, Bld: 135 mg/dL — ABNORMAL HIGH (ref 70–99)
Potassium: 4.1 mmol/L (ref 3.5–5.1)
Sodium: 141 mmol/L (ref 135–145)
Total Bilirubin: 0.6 mg/dL (ref 0.0–1.2)
Total Protein: 5.7 g/dL — ABNORMAL LOW (ref 6.5–8.1)

## 2024-06-08 MED ORDER — PROCHLORPERAZINE MALEATE 10 MG PO TABS
10.0000 mg | ORAL_TABLET | Freq: Once | ORAL | Status: AC
  Administered 2024-06-08: 10 mg via ORAL
  Filled 2024-06-08: qty 1

## 2024-06-08 MED ORDER — DEXAMETHASONE SOD PHOSPHATE PF 10 MG/ML IJ SOLN
10.0000 mg | Freq: Once | INTRAMUSCULAR | Status: AC
  Administered 2024-06-08: 10 mg via INTRAVENOUS

## 2024-06-08 MED ORDER — SODIUM CHLORIDE 0.9 % IV SOLN: Freq: Once | INTRAVENOUS | Status: AC

## 2024-06-08 MED ORDER — DEXTROSE 5 % IV SOLN
56.0000 mg/m2 | Freq: Once | INTRAVENOUS | Status: AC
  Administered 2024-06-08: 120 mg via INTRAVENOUS
  Filled 2024-06-08: qty 60

## 2024-06-08 MED ORDER — ACETAMINOPHEN 500 MG PO TABS
1000.0000 mg | ORAL_TABLET | Freq: Once | ORAL | Status: AC
  Administered 2024-06-08: 1000 mg via ORAL
  Filled 2024-06-08: qty 2

## 2024-06-08 MED ORDER — SODIUM CHLORIDE 0.9% FLUSH: 10.0000 mL | INTRAVENOUS | Status: DC | PRN

## 2024-06-08 MED ORDER — SODIUM CHLORIDE 0.9 % IV SOLN: INTRAVENOUS | Status: DC

## 2024-06-08 NOTE — Patient Instructions (Signed)

## 2024-06-11 ENCOUNTER — Encounter: Payer: Self-pay | Admitting: Hematology

## 2024-06-11 MED ORDER — FENTANYL 12 MCG/HR TD PT72
1.0000 | MEDICATED_PATCH | TRANSDERMAL | 0 refills | Status: AC
Start: 2024-06-11 — End: ?

## 2024-06-14 ENCOUNTER — Encounter: Payer: Self-pay | Admitting: Hematology

## 2024-06-16 ENCOUNTER — Other Ambulatory Visit: Payer: Self-pay

## 2024-06-19 ENCOUNTER — Encounter: Payer: Self-pay | Admitting: Hematology

## 2024-06-21 ENCOUNTER — Other Ambulatory Visit: Payer: Self-pay

## 2024-06-21 DIAGNOSIS — C9000 Multiple myeloma not having achieved remission: Secondary | ICD-10-CM

## 2024-06-21 MED ORDER — TIZANIDINE HCL 2 MG PO CAPS: 2.0000 mg | ORAL_CAPSULE | Freq: Three times a day (TID) | ORAL | 0 refills | Status: AC | PRN

## 2024-06-22 ENCOUNTER — Inpatient Hospital Stay

## 2024-06-22 ENCOUNTER — Inpatient Hospital Stay: Attending: Hematology | Admitting: Hematology

## 2024-06-22 ENCOUNTER — Inpatient Hospital Stay: Attending: Hematology

## 2024-06-22 VITALS — BP 134/89 | HR 82 | Resp 20

## 2024-06-22 VITALS — BP 137/91 | HR 62 | Temp 98.1°F | Resp 20 | Wt 189.6 lb

## 2024-06-22 DIAGNOSIS — C9000 Multiple myeloma not having achieved remission: Secondary | ICD-10-CM

## 2024-06-22 DIAGNOSIS — Z7189 Other specified counseling: Secondary | ICD-10-CM

## 2024-06-22 DIAGNOSIS — Z5112 Encounter for antineoplastic immunotherapy: Secondary | ICD-10-CM | POA: Insufficient documentation

## 2024-06-22 DIAGNOSIS — Z79899 Other long term (current) drug therapy: Secondary | ICD-10-CM | POA: Diagnosis not present

## 2024-06-22 LAB — CMP (CANCER CENTER ONLY)
ALT: 23 U/L (ref 0–44)
AST: 25 U/L (ref 15–41)
Albumin: 4.1 g/dL (ref 3.5–5.0)
Alkaline Phosphatase: 70 U/L (ref 38–126)
Anion gap: 8 (ref 5–15)
BUN: 19 mg/dL (ref 8–23)
CO2: 26 mmol/L (ref 22–32)
Calcium: 9.8 mg/dL (ref 8.9–10.3)
Chloride: 105 mmol/L (ref 98–111)
Creatinine: 1.43 mg/dL — ABNORMAL HIGH (ref 0.61–1.24)
GFR, Estimated: 50 mL/min — ABNORMAL LOW (ref >60)
Glucose, Bld: 85 mg/dL (ref 70–99)
Potassium: 4.4 mmol/L (ref 3.5–5.1)
Sodium: 140 mmol/L (ref 135–145)
Total Bilirubin: 0.5 mg/dL (ref 0.0–1.2)
Total Protein: 5.8 g/dL — ABNORMAL LOW (ref 6.5–8.1)

## 2024-06-22 LAB — CBC WITH DIFFERENTIAL (CANCER CENTER ONLY)
Abs Immature Granulocytes: 0.02 K/uL (ref 0.00–0.07)
Basophils Absolute: 0 K/uL (ref 0.0–0.1)
Basophils Relative: 0 %
Eosinophils Absolute: 0.1 K/uL (ref 0.0–0.5)
Eosinophils Relative: 2 %
HCT: 35.4 % — ABNORMAL LOW (ref 39.0–52.0)
Hemoglobin: 11.9 g/dL — ABNORMAL LOW (ref 13.0–17.0)
Immature Granulocytes: 0 %
Lymphocytes Relative: 22 %
Lymphs Abs: 1.2 K/uL (ref 0.7–4.0)
MCH: 35.2 pg — ABNORMAL HIGH (ref 26.0–34.0)
MCHC: 33.6 g/dL (ref 30.0–36.0)
MCV: 104.7 fL — ABNORMAL HIGH (ref 80.0–100.0)
Monocytes Absolute: 0.4 K/uL (ref 0.1–1.0)
Monocytes Relative: 8 %
Neutro Abs: 3.6 K/uL (ref 1.7–7.7)
Neutrophils Relative %: 68 %
Platelet Count: 128 K/uL — ABNORMAL LOW (ref 150–400)
RBC: 3.38 MIL/uL — ABNORMAL LOW (ref 4.22–5.81)
RDW: 14.1 % (ref 11.5–15.5)
WBC Count: 5.3 K/uL (ref 4.0–10.5)
nRBC: 0 % (ref 0.0–0.2)

## 2024-06-22 MED ORDER — SODIUM CHLORIDE 0.9 % IV SOLN: INTRAVENOUS | Status: DC

## 2024-06-22 MED ORDER — PROCHLORPERAZINE MALEATE 10 MG PO TABS
10.0000 mg | ORAL_TABLET | Freq: Once | ORAL | Status: AC
  Administered 2024-06-22: 10 mg via ORAL
  Filled 2024-06-22: qty 1

## 2024-06-22 MED ORDER — ACETAMINOPHEN 500 MG PO TABS
1000.0000 mg | ORAL_TABLET | Freq: Once | ORAL | Status: AC
  Administered 2024-06-22: 1000 mg via ORAL
  Filled 2024-06-22: qty 2

## 2024-06-22 MED ORDER — DEXAMETHASONE SOD PHOSPHATE PF 10 MG/ML IJ SOLN
10.0000 mg | Freq: Once | INTRAMUSCULAR | Status: AC
  Administered 2024-06-22: 10 mg via INTRAVENOUS

## 2024-06-22 MED ORDER — SODIUM CHLORIDE 0.9 % IV SOLN: Freq: Once | INTRAVENOUS | Status: AC

## 2024-06-22 MED ORDER — DEXTROSE 5 % IV SOLN
56.0000 mg/m2 | Freq: Once | INTRAVENOUS | Status: AC
  Administered 2024-06-22: 120 mg via INTRAVENOUS
  Filled 2024-06-22: qty 60

## 2024-06-22 NOTE — Patient Instructions (Signed)

## 2024-06-23 ENCOUNTER — Other Ambulatory Visit: Payer: Self-pay
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
## 2024-06-28 ENCOUNTER — Encounter: Payer: Self-pay | Admitting: Hematology

## 2024-07-06 ENCOUNTER — Inpatient Hospital Stay

## 2024-07-06 VITALS — BP 138/96 | HR 89 | Temp 98.3°F | Resp 16 | Wt 188.0 lb

## 2024-07-06 DIAGNOSIS — C9000 Multiple myeloma not having achieved remission: Secondary | ICD-10-CM

## 2024-07-06 DIAGNOSIS — Z7189 Other specified counseling: Secondary | ICD-10-CM

## 2024-07-06 DIAGNOSIS — Z5112 Encounter for antineoplastic immunotherapy: Secondary | ICD-10-CM | POA: Diagnosis not present

## 2024-07-06 LAB — CMP (CANCER CENTER ONLY)
ALT: 22 U/L (ref 0–44)
AST: 31 U/L (ref 15–41)
Albumin: 4.1 g/dL (ref 3.5–5.0)
Alkaline Phosphatase: 84 U/L (ref 38–126)
Anion gap: 9 (ref 5–15)
BUN: 18 mg/dL (ref 8–23)
CO2: 26 mmol/L (ref 22–32)
Calcium: 9.6 mg/dL (ref 8.9–10.3)
Chloride: 106 mmol/L (ref 98–111)
Creatinine: 1.6 mg/dL — ABNORMAL HIGH (ref 0.61–1.24)
GFR, Estimated: 44 mL/min — ABNORMAL LOW (ref >60)
Glucose, Bld: 148 mg/dL — ABNORMAL HIGH (ref 70–99)
Potassium: 4.1 mmol/L (ref 3.5–5.1)
Sodium: 141 mmol/L (ref 135–145)
Total Bilirubin: 0.6 mg/dL (ref 0.0–1.2)
Total Protein: 5.8 g/dL — ABNORMAL LOW (ref 6.5–8.1)

## 2024-07-06 LAB — CBC WITH DIFFERENTIAL (CANCER CENTER ONLY)
Abs Immature Granulocytes: 0.01 K/uL (ref 0.00–0.07)
Basophils Absolute: 0 K/uL (ref 0.0–0.1)
Basophils Relative: 1 %
Eosinophils Absolute: 0.1 K/uL (ref 0.0–0.5)
Eosinophils Relative: 1 %
HCT: 35.1 % — ABNORMAL LOW (ref 39.0–52.0)
Hemoglobin: 12.1 g/dL — ABNORMAL LOW (ref 13.0–17.0)
Immature Granulocytes: 0 %
Lymphocytes Relative: 22 %
Lymphs Abs: 1.2 K/uL (ref 0.7–4.0)
MCH: 36 pg — ABNORMAL HIGH (ref 26.0–34.0)
MCHC: 34.5 g/dL (ref 30.0–36.0)
MCV: 104.5 fL — ABNORMAL HIGH (ref 80.0–100.0)
Monocytes Absolute: 0.4 K/uL (ref 0.1–1.0)
Monocytes Relative: 8 %
Neutro Abs: 3.7 K/uL (ref 1.7–7.7)
Neutrophils Relative %: 68 %
Platelet Count: 164 K/uL (ref 150–400)
RBC: 3.36 MIL/uL — ABNORMAL LOW (ref 4.22–5.81)
RDW: 14.2 % (ref 11.5–15.5)
WBC Count: 5.4 K/uL (ref 4.0–10.5)
nRBC: 0 % (ref 0.0–0.2)

## 2024-07-06 MED ORDER — SODIUM CHLORIDE 0.9 % IV SOLN: INTRAVENOUS | Status: DC

## 2024-07-06 MED ORDER — DEXAMETHASONE SOD PHOSPHATE PF 10 MG/ML IJ SOLN
10.0000 mg | Freq: Once | INTRAMUSCULAR | Status: AC
  Administered 2024-07-06: 13:00:00 10 mg via INTRAVENOUS

## 2024-07-06 MED ORDER — ACETAMINOPHEN 500 MG PO TABS
1000.0000 mg | ORAL_TABLET | Freq: Once | ORAL | Status: AC
  Administered 2024-07-06: 13:00:00 1000 mg via ORAL
  Filled 2024-07-06: qty 2

## 2024-07-06 MED ORDER — SODIUM CHLORIDE 0.9% FLUSH: 10.0000 mL | INTRAVENOUS | Status: DC | PRN

## 2024-07-06 MED ORDER — PROCHLORPERAZINE MALEATE 10 MG PO TABS
10.0000 mg | ORAL_TABLET | Freq: Once | ORAL | Status: AC
  Administered 2024-07-06: 13:00:00 10 mg via ORAL
  Filled 2024-07-06: qty 1

## 2024-07-06 MED ORDER — DEXTROSE 5 % IV SOLN
56.0000 mg/m2 | Freq: Once | INTRAVENOUS | Status: AC
  Administered 2024-07-06: 14:00:00 120 mg via INTRAVENOUS
  Filled 2024-07-06: qty 60

## 2024-07-06 MED ORDER — SODIUM CHLORIDE 0.9 % IV SOLN: Freq: Once | INTRAVENOUS | Status: AC

## 2024-07-06 NOTE — Patient Instructions (Signed)
 [facility] - A DEPT OF [facility]  Discharge Instructions: Thank you for choosing [facility] to provide your oncology and hematology care.   If you have a lab appointment with the Cancer Center, please go directly to the Cancer Center and check in at the registration area.   Wear comfortable clothing and clothing appropriate for easy access to any Portacath or PICC line.   We strive to give you quality time with your provider. You may need to reschedule your appointment if you arrive late (15 or more minutes).  Arriving late affects you and other patients whose appointments are after yours.  Also, if you miss three or more appointments without notifying the office, you may be dismissed from the clinic at the provider's discretion.      For prescription refill requests, have your pharmacy contact our office and allow 72 hours for refills to be completed.    Today you received the following chemotherapy and/or immunotherapy agents kyprolis      To help prevent nausea and vomiting after your treatment, we encourage you to take your nausea medication as directed.  BELOW ARE SYMPTOMS THAT SHOULD BE REPORTED IMMEDIATELY: *FEVER GREATER THAN 100.4 F (38 C) OR HIGHER *CHILLS OR SWEATING *NAUSEA AND VOMITING THAT IS NOT CONTROLLED WITH YOUR NAUSEA MEDICATION *UNUSUAL SHORTNESS OF BREATH *UNUSUAL BRUISING OR BLEEDING *URINARY PROBLEMS (pain or burning when urinating, or frequent urination) *BOWEL PROBLEMS (unusual diarrhea, constipation, pain near the anus) TENDERNESS IN MOUTH AND THROAT WITH OR WITHOUT PRESENCE OF ULCERS (sore throat, sores in mouth, or a toothache) UNUSUAL RASH, SWELLING OR PAIN  UNUSUAL VAGINAL DISCHARGE OR ITCHING   Items with * indicate a potential emergency and should be followed up as soon as possible or go to the Emergency Department if any problems should occur.  Please show the CHEMOTHERAPY ALERT CARD or IMMUNOTHERAPY  ALERT CARD at check-in to the Emergency Department and triage nurse.  Should you have questions after your visit or need to cancel or reschedule your appointment, please contact [facility] - A DEPT OF [facility]  Dept: [phone]  and follow the prompts.  Office hours are 8:00 a.m. to 4:30 p.m. Monday - Friday. Please note that voicemails left after 4:00 p.m. may not be returned until the following business day.  We are closed weekends and major holidays. You have access to a nurse at all times for urgent questions. Please call the main number to the clinic Dept: [phone] and follow the prompts.   For any non-urgent questions, you may also contact your provider using MyChart. We now offer e-Visits for anyone 80 and older to request care online for non-urgent symptoms. For details visit [email or web address].   Also download the MyChart app! Go to the app store, search "MyChart", open the app, select [facility], and log in with your MyChart username and password.

## 2024-07-06 NOTE — Progress Notes (Signed)
 Pt ok for tx today with CR: 1.60 per Dr Onesimo.

## 2024-07-07 ENCOUNTER — Encounter: Payer: Self-pay | Admitting: Hematology

## 2024-07-07 ENCOUNTER — Other Ambulatory Visit: Payer: Self-pay | Admitting: Hematology

## 2024-07-07 ENCOUNTER — Other Ambulatory Visit: Payer: Self-pay

## 2024-07-07 DIAGNOSIS — C9000 Multiple myeloma not having achieved remission: Secondary | ICD-10-CM

## 2024-07-07 DIAGNOSIS — I82419 Acute embolism and thrombosis of unspecified femoral vein: Secondary | ICD-10-CM

## 2024-07-07 DIAGNOSIS — C9001 Multiple myeloma in remission: Secondary | ICD-10-CM

## 2024-07-09 ENCOUNTER — Other Ambulatory Visit: Payer: Self-pay | Admitting: Hematology

## 2024-07-09 DIAGNOSIS — C9000 Multiple myeloma not having achieved remission: Secondary | ICD-10-CM

## 2024-07-12 ENCOUNTER — Other Ambulatory Visit (HOSPITAL_COMMUNITY): Payer: Self-pay | Admitting: Physician Assistant

## 2024-07-12 ENCOUNTER — Other Ambulatory Visit: Payer: Self-pay

## 2024-07-12 ENCOUNTER — Encounter: Payer: Self-pay | Admitting: Hematology

## 2024-07-12 DIAGNOSIS — C9000 Multiple myeloma not having achieved remission: Secondary | ICD-10-CM

## 2024-07-12 MED ORDER — FENTANYL 12 MCG/HR TD PT72: 1.0000 | MEDICATED_PATCH | TRANSDERMAL | 0 refills | Status: DC

## 2024-07-12 MED ORDER — TIZANIDINE HCL 2 MG PO CAPS: 2.0000 mg | ORAL_CAPSULE | Freq: Three times a day (TID) | ORAL | 0 refills | Status: AC

## 2024-07-12 MED ORDER — TRAZODONE HCL 50 MG PO TABS: 50.0000 mg | ORAL_TABLET | Freq: Every evening | ORAL | 1 refills | Status: DC | PRN

## 2024-07-20 ENCOUNTER — Inpatient Hospital Stay

## 2024-07-20 VITALS — BP 135/87 | HR 71 | Temp 98.2°F | Resp 18 | Wt 191.0 lb

## 2024-07-20 DIAGNOSIS — Z7189 Other specified counseling: Secondary | ICD-10-CM

## 2024-07-20 DIAGNOSIS — Z5112 Encounter for antineoplastic immunotherapy: Secondary | ICD-10-CM | POA: Diagnosis not present

## 2024-07-20 DIAGNOSIS — C9000 Multiple myeloma not having achieved remission: Secondary | ICD-10-CM

## 2024-07-20 LAB — CBC WITH DIFFERENTIAL (CANCER CENTER ONLY)
Abs Immature Granulocytes: 0.01 K/uL (ref 0.00–0.07)
Basophils Absolute: 0.1 K/uL (ref 0.0–0.1)
Basophils Relative: 1 %
Eosinophils Absolute: 0.1 K/uL (ref 0.0–0.5)
Eosinophils Relative: 1 %
HCT: 34.8 % — ABNORMAL LOW (ref 39.0–52.0)
Hemoglobin: 12 g/dL — ABNORMAL LOW (ref 13.0–17.0)
Immature Granulocytes: 0 %
Lymphocytes Relative: 21 %
Lymphs Abs: 1 K/uL (ref 0.7–4.0)
MCH: 36.3 pg — ABNORMAL HIGH (ref 26.0–34.0)
MCHC: 34.5 g/dL (ref 30.0–36.0)
MCV: 105.1 fL — ABNORMAL HIGH (ref 80.0–100.0)
Monocytes Absolute: 0.4 K/uL (ref 0.1–1.0)
Monocytes Relative: 8 %
Neutro Abs: 3.4 K/uL (ref 1.7–7.7)
Neutrophils Relative %: 69 %
Platelet Count: 134 K/uL — ABNORMAL LOW (ref 150–400)
RBC: 3.31 MIL/uL — ABNORMAL LOW (ref 4.22–5.81)
RDW: 14.1 % (ref 11.5–15.5)
WBC Count: 4.9 K/uL (ref 4.0–10.5)
nRBC: 0 % (ref 0.0–0.2)

## 2024-07-20 LAB — CMP (CANCER CENTER ONLY)
ALT: 21 U/L (ref 0–44)
AST: 25 U/L (ref 15–41)
Albumin: 4.1 g/dL (ref 3.5–5.0)
Alkaline Phosphatase: 71 U/L (ref 38–126)
Anion gap: 10 (ref 5–15)
BUN: 19 mg/dL (ref 8–23)
CO2: 26 mmol/L (ref 22–32)
Calcium: 9.7 mg/dL (ref 8.9–10.3)
Chloride: 105 mmol/L (ref 98–111)
Creatinine: 1.59 mg/dL — ABNORMAL HIGH (ref 0.61–1.24)
GFR, Estimated: 44 mL/min — ABNORMAL LOW
Glucose, Bld: 114 mg/dL — ABNORMAL HIGH (ref 70–99)
Potassium: 4.2 mmol/L (ref 3.5–5.1)
Sodium: 141 mmol/L (ref 135–145)
Total Bilirubin: 0.6 mg/dL (ref 0.0–1.2)
Total Protein: 5.8 g/dL — ABNORMAL LOW (ref 6.5–8.1)

## 2024-07-20 MED ORDER — SODIUM CHLORIDE 0.9 % IV SOLN: INTRAVENOUS | Status: DC

## 2024-07-20 MED ORDER — ACETAMINOPHEN 500 MG PO TABS
1000.0000 mg | ORAL_TABLET | Freq: Once | ORAL | Status: AC
  Administered 2024-07-20: 1000 mg via ORAL
  Filled 2024-07-20: qty 2

## 2024-07-20 MED ORDER — SODIUM CHLORIDE 0.9 % IV SOLN: Freq: Once | INTRAVENOUS | Status: AC

## 2024-07-20 MED ORDER — DEXAMETHASONE SOD PHOSPHATE PF 10 MG/ML IJ SOLN
10.0000 mg | Freq: Once | INTRAMUSCULAR | Status: AC
  Administered 2024-07-20: 10 mg via INTRAVENOUS

## 2024-07-20 MED ORDER — DEXTROSE 5 % IV SOLN
56.0000 mg/m2 | Freq: Once | INTRAVENOUS | Status: AC
  Administered 2024-07-20: 120 mg via INTRAVENOUS
  Filled 2024-07-20: qty 60

## 2024-07-20 MED ORDER — PROCHLORPERAZINE MALEATE 10 MG PO TABS
10.0000 mg | ORAL_TABLET | Freq: Once | ORAL | Status: AC
  Administered 2024-07-20: 10 mg via ORAL
  Filled 2024-07-20: qty 1

## 2024-07-20 MED ORDER — SODIUM CHLORIDE 0.9% FLUSH
10.0000 mL | INTRAVENOUS | Status: DC | PRN
  Administered 2024-07-20: 10 mL

## 2024-07-20 NOTE — Patient Instructions (Signed)

## 2024-07-23 LAB — MULTIPLE MYELOMA PANEL, SERUM
Albumin SerPl Elph-Mcnc: 3.4 g/dL (ref 2.9–4.4)
Albumin/Glob SerPl: 1.8 — ABNORMAL HIGH (ref 0.7–1.7)
Alpha 1: 0.2 g/dL (ref 0.0–0.4)
Alpha2 Glob SerPl Elph-Mcnc: 0.7 g/dL (ref 0.4–1.0)
B-Globulin SerPl Elph-Mcnc: 0.9 g/dL (ref 0.7–1.3)
Gamma Glob SerPl Elph-Mcnc: 0.2 g/dL — ABNORMAL LOW (ref 0.4–1.8)
Globulin, Total: 2 g/dL — ABNORMAL LOW (ref 2.2–3.9)
IgA: 21 mg/dL — ABNORMAL LOW (ref 61–437)
IgG (Immunoglobin G), Serum: 240 mg/dL — ABNORMAL LOW (ref 603–1613)
IgM (Immunoglobulin M), Srm: <5 mg/dL — ABNORMAL LOW (ref 15–143)
Total Protein ELP: 5.4 g/dL — ABNORMAL LOW (ref 6.0–8.5)

## 2024-07-30 ENCOUNTER — Other Ambulatory Visit: Payer: Self-pay

## 2024-07-30 ENCOUNTER — Encounter: Payer: Self-pay | Admitting: Hematology

## 2024-07-30 DIAGNOSIS — C9001 Multiple myeloma in remission: Secondary | ICD-10-CM

## 2024-07-30 MED ORDER — TRAMADOL HCL 50 MG PO TABS: 50.0000 mg | ORAL_TABLET | Freq: Four times a day (QID) | ORAL | 0 refills | Status: AC | PRN

## 2024-07-30 MED ORDER — TRAZODONE HCL 50 MG PO TABS: 50.0000 mg | ORAL_TABLET | Freq: Every evening | ORAL | 1 refills | Status: AC | PRN

## 2024-08-03 ENCOUNTER — Inpatient Hospital Stay: Attending: Hematology

## 2024-08-03 ENCOUNTER — Inpatient Hospital Stay

## 2024-08-03 VITALS — BP 137/87 | HR 67 | Temp 97.9°F | Resp 18

## 2024-08-03 DIAGNOSIS — C9000 Multiple myeloma not having achieved remission: Secondary | ICD-10-CM | POA: Insufficient documentation

## 2024-08-03 DIAGNOSIS — Z5112 Encounter for antineoplastic immunotherapy: Secondary | ICD-10-CM | POA: Insufficient documentation

## 2024-08-03 DIAGNOSIS — Z7189 Other specified counseling: Secondary | ICD-10-CM

## 2024-08-03 LAB — CBC WITH DIFFERENTIAL (CANCER CENTER ONLY)
Abs Immature Granulocytes: 0 K/uL (ref 0.00–0.07)
Basophils Absolute: 0 K/uL (ref 0.0–0.1)
Basophils Relative: 1 %
Eosinophils Absolute: 0.1 K/uL (ref 0.0–0.5)
Eosinophils Relative: 2 %
HCT: 34.1 % — ABNORMAL LOW (ref 39.0–52.0)
Hemoglobin: 11.6 g/dL — ABNORMAL LOW (ref 13.0–17.0)
Immature Granulocytes: 0 %
Lymphocytes Relative: 22 %
Lymphs Abs: 1.1 K/uL (ref 0.7–4.0)
MCH: 36.5 pg — ABNORMAL HIGH (ref 26.0–34.0)
MCHC: 34 g/dL (ref 30.0–36.0)
MCV: 107.2 fL — ABNORMAL HIGH (ref 80.0–100.0)
Monocytes Absolute: 0.3 K/uL (ref 0.1–1.0)
Monocytes Relative: 6 %
Neutro Abs: 3.4 K/uL (ref 1.7–7.7)
Neutrophils Relative %: 69 %
Platelet Count: 167 K/uL (ref 150–400)
RBC: 3.18 MIL/uL — ABNORMAL LOW (ref 4.22–5.81)
RDW: 13.9 % (ref 11.5–15.5)
WBC Count: 5 K/uL (ref 4.0–10.5)
nRBC: 0 % (ref 0.0–0.2)

## 2024-08-03 LAB — CMP (CANCER CENTER ONLY)
ALT: 17 U/L (ref 0–44)
AST: 27 U/L (ref 15–41)
Albumin: 4 g/dL (ref 3.5–5.0)
Alkaline Phosphatase: 81 U/L (ref 38–126)
Anion gap: 18 — ABNORMAL HIGH (ref 5–15)
BUN: 20 mg/dL (ref 8–23)
CO2: 26 mmol/L (ref 22–32)
Calcium: 9.8 mg/dL (ref 8.9–10.3)
Chloride: 101 mmol/L (ref 98–111)
Creatinine: 1.64 mg/dL — ABNORMAL HIGH (ref 0.61–1.24)
GFR, Estimated: 43 mL/min — ABNORMAL LOW
Glucose, Bld: 127 mg/dL — ABNORMAL HIGH (ref 70–99)
Potassium: 4.5 mmol/L (ref 3.5–5.1)
Sodium: 145 mmol/L (ref 135–145)
Total Bilirubin: 0.8 mg/dL (ref 0.0–1.2)
Total Protein: 5.8 g/dL — ABNORMAL LOW (ref 6.5–8.1)

## 2024-08-03 MED ORDER — SODIUM CHLORIDE 0.9 % IV SOLN: INTRAVENOUS | Status: DC

## 2024-08-03 MED ORDER — DEXAMETHASONE SOD PHOSPHATE PF 10 MG/ML IJ SOLN
10.0000 mg | Freq: Once | INTRAMUSCULAR | Status: AC
  Administered 2024-08-03: 10 mg via INTRAVENOUS
  Filled 2024-08-03: qty 1

## 2024-08-03 MED ORDER — PROCHLORPERAZINE MALEATE 10 MG PO TABS
10.0000 mg | ORAL_TABLET | Freq: Once | ORAL | Status: AC
  Administered 2024-08-03: 10 mg via ORAL
  Filled 2024-08-03: qty 1

## 2024-08-03 MED ORDER — DEXTROSE 5 % IV SOLN
56.0000 mg/m2 | Freq: Once | INTRAVENOUS | Status: AC
  Administered 2024-08-03: 120 mg via INTRAVENOUS
  Filled 2024-08-03: qty 60

## 2024-08-03 MED ORDER — ACETAMINOPHEN 500 MG PO TABS
1000.0000 mg | ORAL_TABLET | Freq: Once | ORAL | Status: AC
  Administered 2024-08-03: 1000 mg via ORAL
  Filled 2024-08-03: qty 2

## 2024-08-03 MED ORDER — SODIUM CHLORIDE 0.9 % IV SOLN: Freq: Once | INTRAVENOUS | Status: AC

## 2024-08-03 NOTE — Progress Notes (Signed)
 OK to tx with CR: 1.64 per Dr Onesimo.

## 2024-08-03 NOTE — Patient Instructions (Signed)
 [facility] - A DEPT OF [facility]  Discharge Instructions: Thank you for choosing [facility] to provide your oncology and hematology care.   If you have a lab appointment with the Cancer Center, please go directly to the Cancer Center and check in at the registration area.   Wear comfortable clothing and clothing appropriate for easy access to any Portacath or PICC line.   We strive to give you quality time with your provider. You may need to reschedule your appointment if you arrive late (15 or more minutes).  Arriving late affects you and other patients whose appointments are after yours.  Also, if you miss three or more appointments without notifying the office, you may be dismissed from the clinic at the provider's discretion.      For prescription refill requests, have your pharmacy contact our office and allow 72 hours for refills to be completed.    Today you received the following chemotherapy and/or immunotherapy agent: Carfilzomib (Kyprolis)      To help prevent nausea and vomiting after your treatment, we encourage you to take your nausea medication as directed.  BELOW ARE SYMPTOMS THAT SHOULD BE REPORTED IMMEDIATELY: *FEVER GREATER THAN 100.4 F (38 C) OR HIGHER *CHILLS OR SWEATING *NAUSEA AND VOMITING THAT IS NOT CONTROLLED WITH YOUR NAUSEA MEDICATION *UNUSUAL SHORTNESS OF BREATH *UNUSUAL BRUISING OR BLEEDING *URINARY PROBLEMS (pain or burning when urinating, or frequent urination) *BOWEL PROBLEMS (unusual diarrhea, constipation, pain near the anus) TENDERNESS IN MOUTH AND THROAT WITH OR WITHOUT PRESENCE OF ULCERS (sore throat, sores in mouth, or a toothache) UNUSUAL RASH, SWELLING OR PAIN  UNUSUAL VAGINAL DISCHARGE OR ITCHING   Items with * indicate a potential emergency and should be followed up as soon as possible or go to the Emergency Department if any problems should occur.  Please show the CHEMOTHERAPY ALERT CARD or  IMMUNOTHERAPY ALERT CARD at check-in to the Emergency Department and triage nurse.  Should you have questions after your visit or need to cancel or reschedule your appointment, please contact [facility] - [facility] DE[facility]  Dept: [phone]  and follow the prompts.  Office hours are 8:00 a.m. to 4:30 p.m. Monday - Friday. Please note that voicemails left after 4:00 p.m. may not be returned until the following business day.  We are closed weekends and major holidays. You have access to a nurse at all times for urgent questions. Please call the main number to the clinic Dept: [phone] and follow the prompts.   For any non-urgent questions, you may also contact your provider using MyChart. We now offer e-Visits for anyone 81 and older to request care online for non-urgent symptoms. For details visit [email or web address].   Also download the MyChart app! Go to the app store, search "MyChart", open the app, select [place], and log in with your MyChart username and password.

## 2024-08-09 ENCOUNTER — Other Ambulatory Visit: Payer: Self-pay

## 2024-08-10 ENCOUNTER — Other Ambulatory Visit: Payer: Self-pay

## 2024-08-10 ENCOUNTER — Encounter: Payer: Self-pay | Admitting: Hematology

## 2024-08-10 DIAGNOSIS — C9000 Multiple myeloma not having achieved remission: Secondary | ICD-10-CM

## 2024-08-11 ENCOUNTER — Encounter: Payer: Self-pay | Admitting: Hematology

## 2024-08-11 MED ORDER — FENTANYL 12 MCG/HR TD PT72: 1.0000 | MEDICATED_PATCH | TRANSDERMAL | 0 refills | Status: AC

## 2024-08-13 ENCOUNTER — Other Ambulatory Visit: Payer: Self-pay

## 2024-08-13 DIAGNOSIS — C9000 Multiple myeloma not having achieved remission: Secondary | ICD-10-CM

## 2024-08-17 ENCOUNTER — Inpatient Hospital Stay

## 2024-08-17 ENCOUNTER — Other Ambulatory Visit: Payer: Self-pay | Admitting: Hematology

## 2024-08-17 VITALS — BP 194/84 | HR 74 | Temp 97.7°F | Resp 16 | Ht 70.5 in | Wt 188.8 lb

## 2024-08-17 DIAGNOSIS — C9000 Multiple myeloma not having achieved remission: Secondary | ICD-10-CM

## 2024-08-17 DIAGNOSIS — Z7189 Other specified counseling: Secondary | ICD-10-CM

## 2024-08-17 DIAGNOSIS — Z5112 Encounter for antineoplastic immunotherapy: Secondary | ICD-10-CM | POA: Diagnosis not present

## 2024-08-17 LAB — CBC WITH DIFFERENTIAL (CANCER CENTER ONLY)
Abs Immature Granulocytes: 0.03 10*3/uL (ref 0.00–0.07)
Basophils Absolute: 0.1 10*3/uL (ref 0.0–0.1)
Basophils Relative: 1 %
Eosinophils Absolute: 0.2 10*3/uL (ref 0.0–0.5)
Eosinophils Relative: 3 %
HCT: 35.1 % — ABNORMAL LOW (ref 39.0–52.0)
Hemoglobin: 11.9 g/dL — ABNORMAL LOW (ref 13.0–17.0)
Immature Granulocytes: 0 %
Lymphocytes Relative: 34 %
Lymphs Abs: 2.6 10*3/uL (ref 0.7–4.0)
MCH: 36.3 pg — ABNORMAL HIGH (ref 26.0–34.0)
MCHC: 33.9 g/dL (ref 30.0–36.0)
MCV: 107 fL — ABNORMAL HIGH (ref 80.0–100.0)
Monocytes Absolute: 0.6 10*3/uL (ref 0.1–1.0)
Monocytes Relative: 8 %
Neutro Abs: 4.2 10*3/uL (ref 1.7–7.7)
Neutrophils Relative %: 54 %
Platelet Count: 158 10*3/uL (ref 150–400)
RBC: 3.28 MIL/uL — ABNORMAL LOW (ref 4.22–5.81)
RDW: 13.6 % (ref 11.5–15.5)
WBC Count: 7.7 10*3/uL (ref 4.0–10.5)
nRBC: 0 % (ref 0.0–0.2)

## 2024-08-17 LAB — CMP (CANCER CENTER ONLY)
ALT: 17 U/L (ref 0–44)
AST: 27 U/L (ref 15–41)
Albumin: 4.2 g/dL (ref 3.5–5.0)
Alkaline Phosphatase: 67 U/L (ref 38–126)
Anion gap: 10 (ref 5–15)
BUN: 19 mg/dL (ref 8–23)
CO2: 25 mmol/L (ref 22–32)
Calcium: 9.6 mg/dL (ref 8.9–10.3)
Chloride: 106 mmol/L (ref 98–111)
Creatinine: 1.49 mg/dL — ABNORMAL HIGH (ref 0.61–1.24)
GFR, Estimated: 48 mL/min — ABNORMAL LOW
Glucose, Bld: 73 mg/dL (ref 70–99)
Potassium: 4.3 mmol/L (ref 3.5–5.1)
Sodium: 141 mmol/L (ref 135–145)
Total Bilirubin: 0.6 mg/dL (ref 0.0–1.2)
Total Protein: 5.9 g/dL — ABNORMAL LOW (ref 6.5–8.1)

## 2024-08-17 MED ORDER — PROCHLORPERAZINE MALEATE 10 MG PO TABS
10.0000 mg | ORAL_TABLET | Freq: Once | ORAL | Status: AC
  Administered 2024-08-17: 10 mg via ORAL
  Filled 2024-08-17: qty 1

## 2024-08-17 MED ORDER — DEXTROSE 5 % IV SOLN
56.0000 mg/m2 | Freq: Once | INTRAVENOUS | Status: AC
  Administered 2024-08-17: 120 mg via INTRAVENOUS
  Filled 2024-08-17: qty 60

## 2024-08-17 MED ORDER — DEXAMETHASONE SOD PHOSPHATE PF 10 MG/ML IJ SOLN
10.0000 mg | Freq: Once | INTRAMUSCULAR | Status: AC
  Administered 2024-08-17: 10 mg via INTRAVENOUS
  Filled 2024-08-17: qty 1

## 2024-08-17 MED ORDER — SODIUM CHLORIDE 0.9% FLUSH: 10.0000 mL | INTRAVENOUS | Status: DC | PRN

## 2024-08-17 MED ORDER — SODIUM CHLORIDE 0.9 % IV SOLN: Freq: Once | INTRAVENOUS | Status: AC

## 2024-08-17 MED ORDER — SODIUM CHLORIDE 0.9 % IV SOLN: INTRAVENOUS | Status: DC

## 2024-08-17 MED ORDER — ACETAMINOPHEN 500 MG PO TABS
1000.0000 mg | ORAL_TABLET | Freq: Once | ORAL | Status: AC
  Administered 2024-08-17: 1000 mg via ORAL
  Filled 2024-08-17: qty 2

## 2024-08-17 NOTE — Patient Instructions (Signed)
 [facility] - A DEPT OF [facility]  Discharge Instructions: Thank you for choosing [facility] to provide your oncology and hematology care.   If you have a lab appointment with the Cancer Center, please go directly to the Cancer Center and check in at the registration area.   Wear comfortable clothing and clothing appropriate for easy access to any Portacath or PICC line.   We strive to give you quality time with your provider. You may need to reschedule your appointment if you arrive late (15 or more minutes).  Arriving late affects you and other patients whose appointments are after yours.  Also, if you miss three or more appointments without notifying the office, you may be dismissed from the clinic at the provider's discretion.      For prescription refill requests, have your pharmacy contact our office and allow 72 hours for refills to be completed.    Today you received the following chemotherapy and/or immunotherapy agent: Carfilzomib (Kyprolis)      To help prevent nausea and vomiting after your treatment, we encourage you to take your nausea medication as directed.  BELOW ARE SYMPTOMS THAT SHOULD BE REPORTED IMMEDIATELY: *FEVER GREATER THAN 100.4 F (38 C) OR HIGHER *CHILLS OR SWEATING *NAUSEA AND VOMITING THAT IS NOT CONTROLLED WITH YOUR NAUSEA MEDICATION *UNUSUAL SHORTNESS OF BREATH *UNUSUAL BRUISING OR BLEEDING *URINARY PROBLEMS (pain or burning when urinating, or frequent urination) *BOWEL PROBLEMS (unusual diarrhea, constipation, pain near the anus) TENDERNESS IN MOUTH AND THROAT WITH OR WITHOUT PRESENCE OF ULCERS (sore throat, sores in mouth, or a toothache) UNUSUAL RASH, SWELLING OR PAIN  UNUSUAL VAGINAL DISCHARGE OR ITCHING   Items with * indicate a potential emergency and should be followed up as soon as possible or go to the Emergency Department if any problems should occur.  Please show the CHEMOTHERAPY ALERT CARD or  IMMUNOTHERAPY ALERT CARD at check-in to the Emergency Department and triage nurse.  Should you have questions after your visit or need to cancel or reschedule your appointment, please contact [facility] - [facility] DE[facility]  Dept: [phone]  and follow the prompts.  Office hours are 8:00 a.m. to 4:30 p.m. Monday - Friday. Please note that voicemails left after 4:00 p.m. may not be returned until the following business day.  We are closed weekends and major holidays. You have access to a nurse at all times for urgent questions. Please call the main number to the clinic Dept: [phone] and follow the prompts.   For any non-urgent questions, you may also contact your provider using MyChart. We now offer e-Visits for anyone 81 and older to request care online for non-urgent symptoms. For details visit [email or web address].   Also download the MyChart app! Go to the app store, search "MyChart", open the app, select [place], and log in with your MyChart username and password.

## 2024-08-19 LAB — MULTIPLE MYELOMA PANEL, SERUM
Albumin SerPl Elph-Mcnc: 3.6 g/dL (ref 2.9–4.4)
Albumin/Glob SerPl: 2.1 — ABNORMAL HIGH (ref 0.7–1.7)
Alpha 1: 0.2 g/dL (ref 0.0–0.4)
Alpha2 Glob SerPl Elph-Mcnc: 0.6 g/dL (ref 0.4–1.0)
B-Globulin SerPl Elph-Mcnc: 0.8 g/dL (ref 0.7–1.3)
Gamma Glob SerPl Elph-Mcnc: 0.2 g/dL — ABNORMAL LOW (ref 0.4–1.8)
Globulin, Total: 1.8 g/dL — ABNORMAL LOW (ref 2.2–3.9)
IgA: 22 mg/dL — ABNORMAL LOW (ref 61–437)
IgG (Immunoglobin G), Serum: 235 mg/dL — ABNORMAL LOW (ref 603–1613)
IgM (Immunoglobulin M), Srm: <5 mg/dL — ABNORMAL LOW (ref 15–143)
Total Protein ELP: 5.4 g/dL — ABNORMAL LOW (ref 6.0–8.5)

## 2024-08-20 ENCOUNTER — Other Ambulatory Visit: Payer: Self-pay

## 2024-08-23 ENCOUNTER — Other Ambulatory Visit: Payer: Self-pay

## 2024-08-23 ENCOUNTER — Encounter: Payer: Self-pay | Admitting: Hematology

## 2024-08-23 DIAGNOSIS — C9000 Multiple myeloma not having achieved remission: Secondary | ICD-10-CM

## 2024-08-23 MED ORDER — ERGOCALCIFEROL 1.25 MG (50000 UT) PO CAPS: 50000.0000 [IU] | ORAL_CAPSULE | ORAL | 3 refills | Status: AC

## 2024-08-31 ENCOUNTER — Inpatient Hospital Stay: Attending: Hematology

## 2024-08-31 ENCOUNTER — Inpatient Hospital Stay

## 2024-09-14 ENCOUNTER — Inpatient Hospital Stay

## 2024-09-14 ENCOUNTER — Inpatient Hospital Stay: Admitting: Hematology
